# Patient Record
Sex: Female | Born: 1935
Health system: Southern US, Community
[De-identification: ages and names within clinical notes are randomized; demographics above are authoritative.]

## PROBLEM LIST (undated history)

## (undated) DIAGNOSIS — D75839 Thrombocytosis, unspecified: Secondary | ICD-10-CM

## (undated) DIAGNOSIS — K5790 Diverticulosis of intestine, part unspecified, without perforation or abscess without bleeding: Secondary | ICD-10-CM

## (undated) DIAGNOSIS — D473 Essential (hemorrhagic) thrombocythemia: Secondary | ICD-10-CM

## (undated) DIAGNOSIS — Z5309 Procedure and treatment not carried out because of other contraindication: Secondary | ICD-10-CM

## (undated) DIAGNOSIS — C4490 Unspecified malignant neoplasm of skin, unspecified: Secondary | ICD-10-CM

## (undated) DIAGNOSIS — I6529 Occlusion and stenosis of unspecified carotid artery: Secondary | ICD-10-CM

## (undated) DIAGNOSIS — T884XXA Failed or difficult intubation, initial encounter: Secondary | ICD-10-CM

## (undated) DIAGNOSIS — M5410 Radiculopathy, site unspecified: Secondary | ICD-10-CM

## (undated) DIAGNOSIS — N814 Uterovaginal prolapse, unspecified: Secondary | ICD-10-CM

## (undated) DIAGNOSIS — H269 Unspecified cataract: Secondary | ICD-10-CM

## (undated) DIAGNOSIS — K5792 Diverticulitis of intestine, part unspecified, without perforation or abscess without bleeding: Secondary | ICD-10-CM

## (undated) DIAGNOSIS — K219 Gastro-esophageal reflux disease without esophagitis: Secondary | ICD-10-CM

## (undated) DIAGNOSIS — E785 Hyperlipidemia, unspecified: Secondary | ICD-10-CM

## (undated) DIAGNOSIS — G5760 Lesion of plantar nerve, unspecified lower limb: Secondary | ICD-10-CM

## (undated) DIAGNOSIS — E039 Hypothyroidism, unspecified: Secondary | ICD-10-CM

## (undated) DIAGNOSIS — L719 Rosacea, unspecified: Secondary | ICD-10-CM

## (undated) DIAGNOSIS — N281 Cyst of kidney, acquired: Secondary | ICD-10-CM

## (undated) DIAGNOSIS — N63 Unspecified lump in unspecified breast: Secondary | ICD-10-CM

## (undated) DIAGNOSIS — N819 Female genital prolapse, unspecified: Secondary | ICD-10-CM

## (undated) DIAGNOSIS — B029 Zoster without complications: Secondary | ICD-10-CM

## (undated) HISTORY — DX: Unspecified lump in unspecified breast: N63.0

## (undated) HISTORY — PX: TONSILLECTOMY: SHX5217

## (undated) HISTORY — PX: COLON SURGERY: SHX602

## (undated) HISTORY — PX: TOTAL HIP ARTHROPLASTY: SHX124

## (undated) HISTORY — PX: ADENOIDECTOMY: SUR15

## (undated) HISTORY — DX: Occlusion and stenosis of unspecified carotid artery: I65.29

## (undated) HISTORY — DX: Failed or difficult intubation, initial encounter: T88.4XXA

## (undated) HISTORY — PX: THYROIDECTOMY: SHX17

## (undated) HISTORY — DX: Zoster without complications: B02.9

## (undated) HISTORY — DX: Unspecified malignant neoplasm of skin, unspecified: C44.90

## (undated) HISTORY — DX: Female genital prolapse, unspecified: N81.9

## (undated) HISTORY — PX: OTHER SURGICAL HISTORY: SHX169

## (undated) HISTORY — DX: Gilbert syndrome: E80.4

## (undated) HISTORY — DX: Unspecified cataract: H26.9

## (undated) HISTORY — DX: Essential (hemorrhagic) thrombocythemia: D47.3

## (undated) HISTORY — DX: Hyperlipidemia, unspecified: E78.5

## (undated) HISTORY — DX: Diverticulitis of intestine, part unspecified, without perforation or abscess without bleeding: K57.92

## (undated) HISTORY — DX: Thrombocytosis, unspecified: D75.839

## (undated) HISTORY — PX: JOINT REPLACEMENT: SHX530

## (undated) HISTORY — DX: Diverticulosis of intestine, part unspecified, without perforation or abscess without bleeding: K57.90

## (undated) HISTORY — PX: THYROID LOBECTOMY: SHX420

## (undated) HISTORY — DX: Lesion of plantar nerve, unspecified lower limb: G57.60

## (undated) HISTORY — PX: APPENDECTOMY: SHX54

---

## 2006-04-15 ENCOUNTER — Emergency Department: Payer: Self-pay | Admitting: Emergency Medicine

## 2007-10-18 ENCOUNTER — Ambulatory Visit: Payer: Self-pay | Admitting: Obstetrics and Gynecology

## 2008-07-26 ENCOUNTER — Ambulatory Visit: Payer: Self-pay | Admitting: Obstetrics and Gynecology

## 2008-10-17 ENCOUNTER — Ambulatory Visit: Payer: Self-pay | Admitting: Gastroenterology

## 2008-10-17 HISTORY — PX: UPPER GI ENDOSCOPY: SHX6162

## 2008-10-19 ENCOUNTER — Emergency Department: Payer: Self-pay | Admitting: Emergency Medicine

## 2009-10-30 ENCOUNTER — Ambulatory Visit: Payer: Self-pay | Admitting: Gastroenterology

## 2014-02-06 ENCOUNTER — Ambulatory Visit (INDEPENDENT_AMBULATORY_CARE_PROVIDER_SITE_OTHER): Payer: Medicare Other | Admitting: Podiatry

## 2014-02-06 ENCOUNTER — Ambulatory Visit (INDEPENDENT_AMBULATORY_CARE_PROVIDER_SITE_OTHER): Payer: Medicare Other

## 2014-02-06 ENCOUNTER — Encounter: Payer: Self-pay | Admitting: Podiatry

## 2014-02-06 VITALS — BP 130/69 | HR 78 | Resp 16 | Ht 64.0 in | Wt 148.0 lb

## 2014-02-06 DIAGNOSIS — M205X2 Other deformities of toe(s) (acquired), left foot: Secondary | ICD-10-CM

## 2014-02-06 DIAGNOSIS — M779 Enthesopathy, unspecified: Secondary | ICD-10-CM

## 2014-02-06 DIAGNOSIS — L608 Other nail disorders: Secondary | ICD-10-CM

## 2014-02-06 DIAGNOSIS — L603 Nail dystrophy: Secondary | ICD-10-CM

## 2014-02-06 NOTE — Progress Notes (Signed)
   Subjective:    Patient ID: Gina [name], female    DOB: 1927/02/20, 80 y.o.   MRN: [id number]  HPI Comments: 80 year old female presents the office today with complaints of a white spot on the left hallux toenail which is in present for a couple weeks. She also states that she's had some swelling around the toenail. She denies any redness or drainage. There is on discomfort upon direct pressure over the area. She also states that she recently has broken her fifth digit on her right foot and she was seen by her orthopedic physician for which an x-ray was obtained. She's had no prior treatment for the left foot. No other complaints at this time.    Toe Pain       Review of Systems  All other systems reviewed and are negative.      Objective:   Physical Exam AAO x3, NAD DP/PT pulses palpable bilaterally, CRT less than 3 seconds Protective sensation intact with Simms Weinstein monofilament, vibratory sensation intact, Achilles tendon reflex intact There is an area on central aspect of the left hallux nail white discoloration. There is slight yellow discoloration to the remaining toenails. There is slight edema along the proximal nail border. There is mild tenderness directly overlying the nail. There is no significant ingrowing along the medial or lateral nail borders. There is no surrounding erythema, increased warmth or ascending cellulitis.  There is decreased range of motion of the first MTPJ on the left greater than right. There is no pain with range of motion of this time. No overlying edema, erythema, increase in warmth. No areas of pinpoint bony tenderness or pain with vibratory sensation. There is mild erythema overlying the fifth digit which the patient states is chronic since she broke her toe. She states of this area has been improving. No other areas of pinpoint bony tenderness or pain the vibratory sensation on the right lower extremity. MMT 5/5, ROM WNL No pain with calf  compression, swelling, warmth, erythema. No open lesions or pre-ulcerative lesions.       Assessment & Plan:  80 year old female with nail discoloration, likely onychomycosis; mild edema proximal nail border left hallux; hallux limitus left first MTPJ -X-rays were obtained of the left foot (pt declined x-rays on the right).  -Treatment options both conservative and surgical were discussed including alternatives, risks, complications. -At this time due to the edema along the proximal nail border the left hallux toenail discussed possible nail avulsion. Patient wishes to hold off on any procedure at this time and understands the risks/complications. Monitor for any signs or symptoms of infection and directed to call the office in medial shin occur go to the emergency room. -Patient does not desire any treatment for possible onychomycosis to the discoloration or toenail. -Discussed treatment options for hallux limitus. Patient again wishes to all off. -Follow-up as needed. In the meantime, call the office if any questions, concerns, change in symptoms.

## 2014-06-01 ENCOUNTER — Emergency Department
Admit: 2014-06-01 | Disposition: A | Discharge status: Home / Self Care | Payer: Self-pay | Admitting: Emergency Medicine

## 2014-06-01 LAB — URINALYSIS, COMPLETE
BILIRUBIN, UR: NEGATIVE
Bacteria: NONE SEEN
Blood: NEGATIVE
GLUCOSE, UR: NEGATIVE mg/dL (ref 0–75)
KETONE: NEGATIVE
Leukocyte Esterase: NEGATIVE
Nitrite: NEGATIVE
Ph: 5 (ref 4.5–8.0)
Protein: NEGATIVE
RBC, UR: NONE SEEN /HPF (ref 0–5)
Specific Gravity: 1.011 (ref 1.003–1.030)
WBC UR: NONE SEEN /HPF (ref 0–5)

## 2014-06-01 LAB — COMPREHENSIVE METABOLIC PANEL
ANION GAP: 7 (ref 7–16)
Albumin: 4.3 g/dL
Alkaline Phosphatase: 68 U/L
BILIRUBIN TOTAL: 1 mg/dL
BUN: 28 mg/dL — AB
Calcium, Total: 9.2 mg/dL
Chloride: 105 mmol/L
Co2: 25 mmol/L
Creatinine: 1.3 mg/dL — ABNORMAL HIGH
EGFR (African American): 43 — ABNORMAL LOW
EGFR (Non-African Amer.): 37 — ABNORMAL LOW
Glucose: 153 mg/dL — ABNORMAL HIGH
POTASSIUM: 4.7 mmol/L
SGOT(AST): 24 U/L
SGPT (ALT): 16 U/L
Sodium: 137 mmol/L
TOTAL PROTEIN: 7.2 g/dL

## 2014-06-01 LAB — TROPONIN I: Troponin-I: <0.03 ng/mL

## 2014-06-01 LAB — CBC
HCT: 41.1 % (ref 35.0–47.0)
HGB: 13.4 g/dL (ref 12.0–16.0)
MCH: 28.1 pg (ref 26.0–34.0)
MCHC: 32.6 g/dL (ref 32.0–36.0)
MCV: 86 fL (ref 80–100)
PLATELETS: 439 10*3/uL (ref 150–440)
RBC: 4.76 10*6/uL (ref 3.80–5.20)
RDW: 15 % — AB (ref 11.5–14.5)
WBC: 8.3 10*3/uL (ref 3.6–11.0)

## 2014-06-19 ENCOUNTER — Encounter
Admit: 2014-06-19 | Disposition: A | Discharge status: Home / Self Care | Payer: Self-pay | Attending: Orthopedic Surgery | Admitting: Orthopedic Surgery

## 2014-09-13 ENCOUNTER — Other Ambulatory Visit: Payer: Self-pay | Admitting: Student

## 2014-09-13 DIAGNOSIS — R1310 Dysphagia, unspecified: Secondary | ICD-10-CM

## 2014-09-13 DIAGNOSIS — I10 Essential (primary) hypertension: Secondary | ICD-10-CM | POA: Insufficient documentation

## 2014-09-14 ENCOUNTER — Ambulatory Visit
Admission: RE | Admit: 2014-09-14 | Discharge: 2014-09-14 | Disposition: A | Discharge status: Home / Self Care | Payer: Medicare PPO | Source: Ambulatory Visit | Attending: Student | Admitting: Student

## 2014-09-14 DIAGNOSIS — R1310 Dysphagia, unspecified: Secondary | ICD-10-CM | POA: Diagnosis present

## 2014-10-02 ENCOUNTER — Ambulatory Visit: Payer: Medicare PPO | Admitting: Family Medicine

## 2014-10-05 ENCOUNTER — Ambulatory Visit (INDEPENDENT_AMBULATORY_CARE_PROVIDER_SITE_OTHER): Payer: Medicare PPO | Admitting: Family Medicine

## 2014-10-05 ENCOUNTER — Encounter: Payer: Self-pay | Admitting: Family Medicine

## 2014-10-05 VITALS — BP 120/68 | HR 89 | Temp 97.6°F | Resp 16 | Wt 151.7 lb

## 2014-10-05 DIAGNOSIS — M4722 Other spondylosis with radiculopathy, cervical region: Secondary | ICD-10-CM

## 2014-10-05 DIAGNOSIS — M47812 Spondylosis without myelopathy or radiculopathy, cervical region: Secondary | ICD-10-CM | POA: Diagnosis not present

## 2014-10-05 DIAGNOSIS — N814 Uterovaginal prolapse, unspecified: Secondary | ICD-10-CM

## 2014-10-05 DIAGNOSIS — N281 Cyst of kidney, acquired: Secondary | ICD-10-CM

## 2014-10-05 DIAGNOSIS — N183 Chronic kidney disease, stage 3 unspecified: Secondary | ICD-10-CM

## 2014-10-05 DIAGNOSIS — E039 Hypothyroidism, unspecified: Secondary | ICD-10-CM | POA: Diagnosis not present

## 2014-10-05 DIAGNOSIS — Z2821 Immunization not carried out because of patient refusal: Secondary | ICD-10-CM | POA: Diagnosis not present

## 2014-10-05 DIAGNOSIS — Z96641 Presence of right artificial hip joint: Secondary | ICD-10-CM

## 2014-10-05 NOTE — Progress Notes (Signed)
Name: Copeland Lapier   MRN: [id number]    DOB: February 07, 1927   Date:10/06/2014       Progress Note  Subjective  Chief Complaint  Chief Complaint  Patient presents with  . Establish Care    HPI  Mrs. Gina [name] is a pleasant 81 year old female who presents today to establish care with primary care services. She reports a past medical history of HTN, HLD, Hypothyroidism, CKD with renal cyst on the right kidney, Uterine prolapse managed with pessary, occasional cervical spine radiculopathy affecting left arm. At one point she was on a statin medication but it did not make much of a change with her cholesterol panel so she was taken off of the medication. Mrs. Killgore and her nearly 55 year old husband (3rd marriage) have been living in [place] for nearly 20 years, prior to that [facility]. They continue to live active independent lifestyles and perform all ADLs and IADLs independently. She continues to get mammograms, aged out of PAP tests but does have regular pelvic exams with her gynecologist due to uterine prolapse. Last colonoscope was in 2011 with was normal. She declines pneumococcal shots but does get annual influenza shots. Overall she voices no complaints or concerns today.  Patient Active Problem List   Diagnosis Date Noted  . CKD (chronic kidney disease), stage III 10/06/2014  . Renal cyst, acquired, right 10/06/2014  . Uterine prolapse 10/06/2014  . Cervical radiculopathy due to degenerative joint disease of spine 10/06/2014  . History of right hip replacement 10/06/2014  . Pneumococcal vaccination declined by patient 10/06/2014  . Hypothyroidism, adult 10/05/2014  . Hypertension goal BP (blood pressure) < 150/90 09/13/2014    Social History  Substance Use Topics  . Smoking status: Former Research scientist (life sciences)  . Smokeless tobacco: Not on file  . Alcohol Use: No     Current outpatient prescriptions:  .  amLODipine (NORVASC) 5 MG tablet, Take by mouth., Disp: , Rfl:  .  aspirin EC 81  MG tablet, Take by mouth., Disp: , Rfl:  .  Cholecalciferol (VITAMIN D3) 1000 UNITS CAPS, Take by mouth., Disp: , Rfl:  .  Cranberry 500 MG CAPS, Take by mouth., Disp: , Rfl:  .  irbesartan (AVAPRO) 150 MG tablet, Take by mouth., Disp: , Rfl:  .  levothyroxine (SYNTHROID, LEVOTHROID) 88 MCG tablet, Take by mouth., Disp: , Rfl:  .  vitamin E 400 UNIT capsule, Take 400 Units by mouth daily., Disp: , Rfl:   Past Surgical History  Procedure Laterality Date  . Tonsillectomy    . Appendectomy    . Cesarean section    . Joint replacement Right     total hip  . Thyroidectomy      partial    Family History  Problem Relation Age of Onset  . Family history unknown: Yes    No Known Allergies   Review of Systems  CONSTITUTIONAL: No significant weight changes, fever, chills, weakness or fatigue.  HEENT:  - Eyes: No visual changes.  - Ears: No auditory changes. No pain.  - Nose: No sneezing, congestion, runny nose. - Throat: No sore throat. No changes in swallowing. SKIN: No rash or itching.  CARDIOVASCULAR: No chest pain, chest pressure or chest discomfort. No palpitations or edema.  RESPIRATORY: No shortness of breath, cough or sputum.  GASTROINTESTINAL: No anorexia, nausea, vomiting. No changes in bowel habits. No abdominal pain or blood.  GENITOURINARY: No dysuria. No frequency. No discharge. NEUROLOGICAL: No headache, dizziness, syncope, paralysis, ataxia,  numbness or tingling in the extremities. No memory changes. No change in bowel or bladder control.  MUSCULOSKELETAL: No joint pain. No muscle pain. HEMATOLOGIC: No anemia, bleeding or bruising.  LYMPHATICS: No enlarged lymph nodes.  PSYCHIATRIC: No change in mood. No change in sleep pattern.  ENDOCRINOLOGIC: No reports of sweating, cold or heat intolerance. No polyuria or polydipsia.     Objective  BP 120/68 mmHg  Pulse 89  Temp(Src) 97.6 F (36.4 C) (Oral)  Resp 16  Wt 151 lb 11.2 oz (68.811 kg)  SpO2 94% Body mass  index is 26.03 kg/(m^2).  Physical Exam  Constitutional: Patient appears well-developed and well-nourished. In no distress.  HEENT:  - Head: Normocephalic and atraumatic.  - Ears: Bilateral TMs gray, no erythema or effusion - Nose: Nasal mucosa moist - Mouth/Throat: Oropharynx is clear and moist. No tonsillar hypertrophy or erythema. No post nasal drainage.  - Eyes: Conjunctivae clear, EOM movements normal. PERRLA. No scleral icterus.  Neck: Normal range of motion. Neck supple. No JVD present. No thyromegaly present.  Cardiovascular: Normal rate, regular rhythm and normal heart sounds.  No murmur heard.  Pulmonary/Chest: Effort normal and breath sounds normal. No respiratory distress. Musculoskeletal: Normal range of motion bilateral UE and LE, no joint effusions. Peripheral vascular: Bilateral LE no edema. Neurological: CN II-XII grossly intact with no focal deficits. Alert and oriented to person, place, and time. Coordination, balance, strength, speech and gait are normal.  Skin: Skin is warm and dry. No rash noted. No erythema.  Psychiatric: Patient has a normal mood and affect. Behavior is normal in office today. Judgment and thought content normal in office today.   Assessment & Plan  1. Hypothyroidism, adult Clinically stable findings, continue current regimen.  2. CKD (chronic kidney disease), stage III Clinically stable findings based on clinical exam and on review of any pertinent results. Recommended to patient that they continue their current regimen with regular follow ups with specialist pertaining to medical condition.   3. Renal cyst, acquired, right Recent CMP on file reviewed.  4. Uterine prolapse Clinically stable findings based on clinical exam and on review of any pertinent results. Recommended to patient that they continue their current regimen with regular follow ups with specialist pertaining to medical condition.   5. Cervical radiculopathy due to  degenerative joint disease of spine Asymptomatic currently.  6. History of right hip replacement Clinically stable findings based on clinical exam and on review of any pertinent results. Recommended to patient that they continue their current regimen with regular follow ups with specialist pertaining to medical condition.   7. Pneumococcal vaccination declined by patient Despite thorough counseling the patient has declined the proposed immunization and expresses understanding the risks and benefits explained today.

## 2014-10-06 DIAGNOSIS — N183 Chronic kidney disease, stage 3 (moderate): Secondary | ICD-10-CM

## 2014-10-06 DIAGNOSIS — M4722 Other spondylosis with radiculopathy, cervical region: Secondary | ICD-10-CM | POA: Insufficient documentation

## 2014-10-06 DIAGNOSIS — Z96641 Presence of right artificial hip joint: Secondary | ICD-10-CM | POA: Insufficient documentation

## 2014-10-06 DIAGNOSIS — Z2821 Immunization not carried out because of patient refusal: Secondary | ICD-10-CM | POA: Insufficient documentation

## 2014-10-06 DIAGNOSIS — N281 Cyst of kidney, acquired: Secondary | ICD-10-CM | POA: Insufficient documentation

## 2014-10-06 DIAGNOSIS — N814 Uterovaginal prolapse, unspecified: Secondary | ICD-10-CM | POA: Insufficient documentation

## 2014-10-06 DIAGNOSIS — N1832 Chronic kidney disease, stage 3b: Secondary | ICD-10-CM | POA: Insufficient documentation

## 2014-10-22 ENCOUNTER — Other Ambulatory Visit: Payer: Self-pay | Admitting: Family Medicine

## 2014-10-22 DIAGNOSIS — E039 Hypothyroidism, unspecified: Secondary | ICD-10-CM

## 2014-10-22 MED ORDER — LEVOTHYROXINE SODIUM 88 MCG PO TABS: 88.0000 ug | ORAL_TABLET | Freq: Every day | ORAL | Status: DC

## 2014-10-22 NOTE — Telephone Encounter (Signed)
Refill request was sent to Dr. Ashany Sundaram for approval and submission.  

## 2014-10-22 NOTE — Telephone Encounter (Signed)
Pt is requesting a refill on Levothyroxine to be sent to [facility].

## 2014-10-24 ENCOUNTER — Telehealth: Payer: Self-pay | Admitting: Family Medicine

## 2014-10-24 DIAGNOSIS — E039 Hypothyroidism, unspecified: Secondary | ICD-10-CM

## 2014-10-24 NOTE — Telephone Encounter (Signed)
Pt states that her RX was sent to [facility] for the levothyroxine and it needs to go to [address]. Pt states this is the only medication that needs to go to [facility] the others that she takes needs to go to [facility].

## 2014-10-24 NOTE — Telephone Encounter (Signed)
Refill request was sent to Dr. Bobetta Lime for approval and submission to be sent to [facility] [address].

## 2014-10-25 MED ORDER — LEVOTHYROXINE SODIUM 88 MCG PO TABS: 88.0000 ug | ORAL_TABLET | Freq: Every day | ORAL | Status: DC

## 2014-10-25 NOTE — Telephone Encounter (Signed)
Patient is requesting a return call today concerning her medications being sent to the wrong pharmacy. [phone]

## 2014-10-25 NOTE — Telephone Encounter (Signed)
Rx was verbally called in to [facility], patient was informed and asked for only this particular medication to be sent to [facility] from now on.

## 2014-11-28 ENCOUNTER — Other Ambulatory Visit: Payer: Self-pay | Admitting: Family Medicine

## 2014-11-29 ENCOUNTER — Telehealth: Payer: Self-pay | Admitting: Family Medicine

## 2014-11-29 MED ORDER — IRBESARTAN 150 MG PO TABS: 150.0000 mg | ORAL_TABLET | Freq: Every day | ORAL | Status: DC

## 2014-11-29 NOTE — Telephone Encounter (Signed)
Sent refill to [facility]

## 2014-11-29 NOTE — Telephone Encounter (Signed)
Dr Nadine Counts Patient is needing a refill on Irbesartan. Please send to [facility]. She only have enough for 2 days

## 2014-11-29 NOTE — Telephone Encounter (Signed)
Patient informed and thanks you °

## 2014-11-30 ENCOUNTER — Other Ambulatory Visit: Payer: Self-pay | Admitting: Family Medicine

## 2014-11-30 NOTE — Telephone Encounter (Signed)
Pt received her RX for her irbesartan but it was only for 30 tablets and patient usually receives 90 tablets. Pt would like the remaining 60 tablets to be sent to her pharmacy.

## 2014-12-03 ENCOUNTER — Other Ambulatory Visit: Payer: Self-pay

## 2014-12-03 MED ORDER — IRBESARTAN 150 MG PO TABS: 150.0000 mg | ORAL_TABLET | Freq: Every day | ORAL | Status: DC

## 2014-12-03 NOTE — Telephone Encounter (Signed)
Per Dr. Ancil Boozer, I contacted [place] and spoke to [facility] to correct the rx refill that was submitted online. It was changed so that this patient will have a 90 day supply with 5 refills.

## 2014-12-03 NOTE — Telephone Encounter (Signed)
Refill request was sent to Dr. Krichna Sowles for approval and submission.  

## 2014-12-19 ENCOUNTER — Ambulatory Visit (INDEPENDENT_AMBULATORY_CARE_PROVIDER_SITE_OTHER): Payer: Medicare PPO | Admitting: Family Medicine

## 2014-12-19 ENCOUNTER — Encounter: Payer: Self-pay | Admitting: Family Medicine

## 2014-12-19 VITALS — BP 126/80 | HR 72 | Temp 98.0°F | Resp 14 | Wt 154.2 lb

## 2014-12-19 DIAGNOSIS — J029 Acute pharyngitis, unspecified: Secondary | ICD-10-CM | POA: Insufficient documentation

## 2014-12-19 DIAGNOSIS — J0100 Acute maxillary sinusitis, unspecified: Secondary | ICD-10-CM | POA: Diagnosis not present

## 2014-12-19 MED ORDER — AZITHROMYCIN 250 MG PO TABS: ORAL_TABLET | ORAL | Status: DC

## 2014-12-19 MED ORDER — HYDROCOD POLST-CPM POLST ER 10-8 MG/5ML PO SUER: 5.0000 mL | Freq: Every evening | ORAL | Status: DC | PRN

## 2014-12-19 NOTE — Progress Notes (Deleted)
Name: Gina [name]   MRN: [id number]    DOB: May 14, 1926   Date:12/19/2014       Progress Note  Subjective  Chief Complaint  Chief Complaint  Patient presents with  . Sore Throat    tickling post nasal  . Cough    dry    HPI  Patient is here today with concerns regarding the following symptoms sore throat, congestion, post nasal drip, sneezing, ear pressure, non productive cough and productive cough that started about a week ago.  Associated with fatigue and malaise. Has tried the following home remedies: hot soup (chicken), OTC Tylenol, warm salt gargle and throat lozengers.  No problem-specific assessment & plan notes found for this encounter.   Past Medical History  Diagnosis Date  . Thyroid disease   . Hypertension   . Hyperlipidemia     history of   . Cataract     Social History  Substance Use Topics  . Smoking status: Former Research scientist (life sciences)  . Smokeless tobacco: Not on file  . Alcohol Use: No     Current outpatient prescriptions:  .  amLODipine (NORVASC) 5 MG tablet, TAKE ONE TABLET BY MOUTH EVERY DAY, Disp: 90 tablet, Rfl: 3 .  aspirin EC 81 MG tablet, Take by mouth., Disp: , Rfl:  .  Cholecalciferol (VITAMIN D3) 1000 UNITS CAPS, Take by mouth., Disp: , Rfl:  .  Cranberry 500 MG CAPS, Take by mouth., Disp: , Rfl:  .  irbesartan (AVAPRO) 150 MG tablet, Take 1 tablet (150 mg total) by mouth daily., Disp: 60 tablet, Rfl: 5 .  levothyroxine (SYNTHROID, LEVOTHROID) 88 MCG tablet, Take 1 tablet (88 mcg total) by mouth daily before breakfast., Disp: 90 tablet, Rfl: 2 .  vitamin E 400 UNIT capsule, Take 400 Units by mouth daily., Disp: , Rfl:   No Known Allergies  ROS  Positive for fatigue, nasal congestion, sinus pressure, ear fullness, cough as mentioned in HPI, otherwise all systems reviewed and are negative.  Objective  Filed Vitals:   12/19/14 1107  BP: 126/80  Pulse: 72  Temp: 98 F (36.7 C)  TempSrc: Oral  Resp: 14  Weight: 154 lb 3.2 oz (69.945 kg)  SpO2:  95%   Body mass index is 26.46 kg/(m^2).   Physical Exam  Constitutional: Patient appears well-developed and well-nourished. In no acute distress but does appear to be fatigued from acute illness. HEENT:  - Head: Normocephalic and atraumatic.  - Ears: RIGHT TM bulging with minimal clear exudate, LEFT TM bulging with minimal clear exudate.  - Nose: Nasal mucosa boggy and congested.  - Mouth/Throat: Oropharynx is moist with slight erythema of bilateral tonsils without hypertrophy or exudates. Post nasal drainage present.  - Eyes: Conjunctivae clear, EOM movements normal. PERRLA. No scleral icterus.  Neck: Normal range of motion. Neck supple. No JVD present. No thyromegaly present. No local lymphadenopathy. Cardiovascular: Regular rate, regular rhythm with no murmurs heard.  Pulmonary/Chest: Effort normal and breath sounds clear in all lung fields.  Musculoskeletal: Normal range of motion bilateral UE and LE, no joint effusions. Skin: Skin is warm and dry. No rash noted. Psychiatric: Patient has a normal mood and affect. Behavior is normal in office today. Judgment and thought content normal in office today.   Assessment & Plan  Etiologies include allergic rhinitis, viral or bacterial infection. Instructed patient on increasing hydration, nasal saline spray, steam inhalation, NSAID if tolerated and not contraindicated. If not already doing so start taking daily anti-histamine and use a steroid nasal  spray. If symptoms persist/worsen may consider antibiotic therapy.

## 2014-12-19 NOTE — Progress Notes (Signed)
Name: Gina [name]   MRN: [id number]    DOB: Sep 02, 1926   Date:12/19/2014       Progress Note  Subjective  Chief Complaint  Chief Complaint  Patient presents with  . Sore Throat    tickling post nasal  . Cough    dry    HPI  Patient is here today with concerns regarding the following symptoms sore throat, congestion, post nasal drip, sneezing, ear pressure, non productive cough that started this past Saturday (5 days ago).  Associated with fatigue and malaise. Throat clearing and irritation has been keeping her up at night. Has tried the following home remedies: hot soup (chicken), OTC Tylenol, warm salt gargle and throat lozengers. Sick contacts include her husband who is also ill and saw a physician in my office the other day. Not associated with rash, headaches, altered mentation, night sweats.   Past Medical History  Diagnosis Date  . Thyroid disease   . Hypertension   . Hyperlipidemia     history of   . Cataract     Social History  Substance Use Topics  . Smoking status: Former Research scientist (life sciences)  . Smokeless tobacco: Not on file  . Alcohol Use: No     Current outpatient prescriptions:  .  amLODipine (NORVASC) 5 MG tablet, TAKE ONE TABLET BY MOUTH EVERY DAY, Disp: 90 tablet, Rfl: 3 .  aspirin EC 81 MG tablet, Take by mouth., Disp: , Rfl:  .  Cholecalciferol (VITAMIN D3) 1000 UNITS CAPS, Take by mouth., Disp: , Rfl:  .  Cranberry 500 MG CAPS, Take by mouth., Disp: , Rfl:  .  irbesartan (AVAPRO) 150 MG tablet, Take 1 tablet (150 mg total) by mouth daily., Disp: 60 tablet, Rfl: 5 .  levothyroxine (SYNTHROID, LEVOTHROID) 88 MCG tablet, Take 1 tablet (88 mcg total) by mouth daily before breakfast., Disp: 90 tablet, Rfl: 2 .  vitamin E 400 UNIT capsule, Take 400 Units by mouth daily., Disp: , Rfl:   No Known Allergies  ROS  Positive for fatigue, sore throat, nasal congestion, sinus pressure, ear fullness, cough as mentioned in HPI, otherwise all systems reviewed and are  negative.  Objective  Filed Vitals:   12/19/14 1107  BP: 126/80  Pulse: 72  Temp: 98 F (36.7 C)  TempSrc: Oral  Resp: 14  Weight: 154 lb 3.2 oz (69.945 kg)  SpO2: 95%   Body mass index is 26.46 kg/(m^2).   Physical Exam  Constitutional: Patient appears well-developed and well-nourished. In no acute distress but does appear to be fatigued from acute illness. HEENT:  - Head: Normocephalic and atraumatic.  - Ears: RIGHT TM bulging with minimal clear exudate, LEFT TM bulging with minimal clear exudate.  - Nose: Nasal mucosa boggy and congested.  - Mouth/Throat: Oropharynx is moist with slight erythema of bilateral tonsils without hypertrophy or exudates. Post nasal drainage present.  - Eyes: Conjunctivae clear, EOM movements normal. PERRLA. No scleral icterus.  Neck: Normal range of motion. Neck supple. No JVD present. No thyromegaly present. No local lymphadenopathy. Cardiovascular: Regular rate, regular rhythm with no murmurs heard.  Pulmonary/Chest: Effort normal and breath sounds clear in all lung fields.  Musculoskeletal: Normal range of motion bilateral UE and LE, no joint effusions. Skin: Skin is warm and dry. No rash noted. Psychiatric: Patient has a normal mood and affect. Behavior is normal in office today. Judgment and thought content normal in office today.   Assessment & Plan  1. Subacute maxillary sinusitis Etiologies include allergic rhinitis, viral  or bacterial infection. Instructed patient on increasing hydration, nasal saline spray, steam inhalation, NSAID if tolerated and not contraindicated.  - azithromycin (ZITHROMAX) 250 MG tablet; 2 tabs po day 1 then 1 tab po qday for 4 more days (generic Zpak)  Dispense: 6 tablet; Refill: 0 - chlorpheniramine-HYDROcodone (TUSSIONEX PENNKINETIC ER) 10-8 MG/5ML SUER; Take 5 mLs by mouth at bedtime as needed for cough.  Dispense: 50 mL; Refill: 0  2. Pharyngitis If tussionex too expensive may use Robittusin PM OTC,  throat lozenges, salt water gargles.

## 2014-12-19 NOTE — Patient Instructions (Signed)

## 2014-12-25 ENCOUNTER — Emergency Department
Admission: EM | Admit: 2014-12-25 | Discharge: 2014-12-25 | Disposition: A | Discharge status: Home / Self Care | Payer: Medicare PPO | Attending: Emergency Medicine | Admitting: Emergency Medicine

## 2014-12-25 ENCOUNTER — Encounter: Payer: Self-pay | Admitting: Emergency Medicine

## 2014-12-25 DIAGNOSIS — Z792 Long term (current) use of antibiotics: Secondary | ICD-10-CM | POA: Diagnosis not present

## 2014-12-25 DIAGNOSIS — Z7982 Long term (current) use of aspirin: Secondary | ICD-10-CM | POA: Insufficient documentation

## 2014-12-25 DIAGNOSIS — Z87891 Personal history of nicotine dependence: Secondary | ICD-10-CM | POA: Insufficient documentation

## 2014-12-25 DIAGNOSIS — G5790 Unspecified mononeuropathy of unspecified lower limb: Secondary | ICD-10-CM

## 2014-12-25 DIAGNOSIS — G5793 Unspecified mononeuropathy of bilateral lower limbs: Secondary | ICD-10-CM | POA: Diagnosis not present

## 2014-12-25 DIAGNOSIS — I129 Hypertensive chronic kidney disease with stage 1 through stage 4 chronic kidney disease, or unspecified chronic kidney disease: Secondary | ICD-10-CM | POA: Insufficient documentation

## 2014-12-25 DIAGNOSIS — M545 Low back pain: Secondary | ICD-10-CM | POA: Diagnosis present

## 2014-12-25 DIAGNOSIS — Z79899 Other long term (current) drug therapy: Secondary | ICD-10-CM | POA: Diagnosis not present

## 2014-12-25 DIAGNOSIS — N183 Chronic kidney disease, stage 3 (moderate): Secondary | ICD-10-CM | POA: Insufficient documentation

## 2014-12-25 NOTE — Discharge Instructions (Signed)
Neuropathic Pain Neuropathic pain is pain caused by damage to the nerves that are responsible for certain sensations in your body (sensory nerves). The pain can be caused by damage to:   The sensory nerves that send signals to your spinal cord and brain (peripheral nervous system).  The sensory nerves in your brain or spinal cord (central nervous system). Neuropathic pain can make you more sensitive to pain. What would be a minor sensation for most people may feel very painful if you have neuropathic pain. This is usually a long-term condition that can be difficult to treat. The type of pain can differ from person to person. It may start suddenly (acute), or it may develop slowly and last for a long time (chronic). Neuropathic pain may come and go as damaged nerves heal or may stay at the same level for years. It often causes emotional distress, loss of sleep, and a lower quality of life. CAUSES  The most common cause of damage to a sensory nerve is diabetes. Many other diseases and conditions can also cause neuropathic pain. Causes of neuropathic pain can be classified as:  Toxic. Many drugs and chemicals can cause toxic damage. The most common cause of toxic neuropathic pain is damage from drug treatment for cancer (chemotherapy).  Metabolic. This type of pain can happen when a disease causes imbalances that damage nerves. Diabetes is the most common of these diseases. Vitamin B deficiency caused by long-term alcohol abuse is another common cause.  Traumatic. Any injury that cuts, crushes, or stretches a nerve can cause damage and pain. A common example is feeling pain after losing an arm or leg (phantom limb pain).  Compression-related. If a sensory nerve gets trapped or compressed for a long period of time, the blood supply to the nerve can be cut off.  Vascular. Many blood vessel diseases can cause neuropathic pain by decreasing blood supply and oxygen to nerves.  Autoimmune. This type of  pain results from diseases in which the body's defense system mistakenly attacks sensory nerves. Examples of autoimmune diseases that can cause neuropathic pain include lupus and multiple sclerosis.  Infectious. Many types of viral infections can damage sensory nerves and cause pain. Shingles infection is a common cause of this type of pain.  Inherited. Neuropathic pain can be a symptom of many diseases that are passed down through families (genetic). SIGNS AND SYMPTOMS  The main symptom is pain. Neuropathic pain is often described as:  Burning.  Shock-like.  Stinging.  Hot or cold.  Itching. DIAGNOSIS  No single test can diagnose neuropathic pain. Your health care provider will do a physical exam and ask you about your pain. You may use a pain scale to describe how bad your pain is. You may also have tests to see if you have a high sensitivity to pain and to help find the cause and location of any sensory nerve damage. These tests may include:  Imaging studies, such as:  X-rays.  CT scan.  MRI.  Nerve conduction studies to test how well nerve signals travel through your sensory nerves (electrodiagnostic testing).  Stimulating your sensory nerves through electrodes on your skin and measuring the response in your spinal cord and brain (somatosensory evoked potentials). TREATMENT  Treatment for neuropathic pain may change over time. You may need to try different treatment options or a combination of treatments. Some options include:  Over-the-counter pain relievers.  Prescription medicines. Some medicines used to treat other conditions may also help neuropathic pain. These  include medicines to:  Control seizures (anticonvulsants).  Relieve depression (antidepressants).  Prescription-strength pain relievers (narcotics). These are usually used when other pain relievers do not help.  Transcutaneous nerve stimulation (TENS). This uses electrical currents to block painful nerve  signals. The treatment is painless.  Topical and local anesthetics. These are medicines that numb the nerves. They can be injected as a nerve block or applied to the skin.  Alternative treatments, such as:  Acupuncture.  Meditation.  Massage.  Physical therapy.  Pain management programs.  Counseling. HOME CARE INSTRUCTIONS  Learn as much as you can about your condition.  Take medicines only as directed by your health care provider.  Work closely with all your health care providers to find what works best for you.  Have a good support system at home.  Consider joining a chronic pain support group. SEEK MEDICAL CARE IF:  Your pain treatments are not helping.  You are having side effects from your medicines.  You are struggling with fatigue, mood changes, depression, or anxiety.   This information is not intended to replace advice given to you by your health care provider. Make sure you discuss any questions you have with your health care provider.   Document Released: 11/07/2003 Document Revised: 03/02/2014 Document Reviewed: 07/20/2013 Elsevier Interactive Patient Education 2016 [employer]  Peripheral Neuropathy Peripheral neuropathy is a type of nerve damage. It affects nerves that carry signals between the spinal cord and other parts of the body. These are called peripheral nerves. With peripheral neuropathy, one nerve or a group of nerves may be damaged.  CAUSES  Many things can damage peripheral nerves. For some people with peripheral neuropathy, the cause is unknown. Some causes include:  Diabetes. This is the most common cause of peripheral neuropathy.  Injury to a nerve.  Pressure or stress on a nerve that lasts a long time.  Too little vitamin B. Alcoholism can lead to this.  Infections.  Autoimmune diseases, such as multiple sclerosis and systemic lupus erythematosus.  Inherited nerve diseases.  Some medicines, such as cancer drugs.  Toxic  substances, such as lead and mercury.  Too little blood flowing to the legs.  Kidney disease.  Thyroid disease. SIGNS AND SYMPTOMS  Different people have different symptoms. The symptoms you have will depend on which of your nerves is damaged. Common symptoms include:  Loss of feeling (numbness) in the feet and hands.  Tingling in the feet and hands.  Pain that burns.  Very sensitive skin.  Weakness.  Not being able to move a part of the body (paralysis).  Muscle twitching.  Clumsiness or poor coordination.  Loss of balance.  Not being able to control your bladder.  Feeling dizzy.  Sexual problems. DIAGNOSIS  Peripheral neuropathy is a symptom, not a disease. Finding the cause of peripheral neuropathy can be hard. To figure that out, your health care provider will take a medical history and do a physical exam. A neurological exam will also be done. This involves checking things affected by your brain, spinal cord, and nerves (nervous system). For example, your health care provider will check your reflexes, how you move, and what you can feel.  Other types of tests may also be ordered, such as:  Blood tests.  A test of the fluid in your spinal cord.  Imaging tests, such as CT scans or an MRI.  Electromyography (EMG). This test checks the nerves that control muscles.  Nerve conduction velocity tests. These tests check how fast  messages pass through your nerves.  Nerve biopsy. A small piece of nerve is removed. It is then checked under a microscope. TREATMENT   Medicine is often used to treat peripheral neuropathy. Medicines may include:  Pain-relieving medicines. Prescription or over-the-counter medicine may be suggested.  Antiseizure medicine. This may be used for pain.  Antidepressants. These also may help ease pain from neuropathy.  Lidocaine. This is a numbing medicine. You might wear a patch or be given a shot.  Mexiletine. This medicine is typically  used to help control irregular heart rhythms.  Surgery. Surgery may be needed to relieve pressure on a nerve or to destroy a nerve that is causing pain.  Physical therapy to help movement.  Assistive devices to help movement. HOME CARE INSTRUCTIONS   Only take over-the-counter or prescription medicines as directed by your health care provider. Follow the instructions carefully for any given medicines. Do not take any other medicines without first getting approval from your health care provider.  If you have diabetes, work closely with your health care provider to keep your blood sugar under control.  If you have numbness in your feet:  Check every day for signs of injury or infection. Watch for redness, warmth, and swelling.  Wear padded socks and comfortable shoes. These help protect your feet.  Do not do things that put pressure on your damaged nerve.  Do not smoke. Smoking keeps blood from getting to damaged nerves.  Avoid or limit alcohol. Too much alcohol can cause a lack of B vitamins. These vitamins are needed for healthy nerves.  Develop a good support system. Coping with peripheral neuropathy can be stressful. Talk to a mental health specialist or join a support group if you are struggling.  Follow up with your health care provider as directed. SEEK MEDICAL CARE IF:   You have new signs or symptoms of peripheral neuropathy.  You are struggling emotionally from dealing with peripheral neuropathy.  You have a fever. SEEK IMMEDIATE MEDICAL CARE IF:   You have an injury or infection that is not healing.  You feel very dizzy or begin vomiting.  You have chest pain.  You have trouble breathing.   This information is not intended to replace advice given to you by your health care provider. Make sure you discuss any questions you have with your health care provider.   Document Released: 01/30/2002 Document Revised: 10/22/2010 Document Reviewed: 10/17/2012 Elsevier  Interactive Patient Education [employer].

## 2014-12-25 NOTE — ED Notes (Signed)
States has been doing hard work and today developd low back pain with numbness down her legs

## 2014-12-25 NOTE — ED Provider Notes (Signed)
[facility] Emergency Department Provider Note  ____________________________________________  Time seen: Approximately 9:02 AM  I have reviewed the triage vital signs and the nursing notes.   HISTORY  Chief Complaint Back Pain   HPI Abbi Mancini is a 81 y.o. female resents for evaluation of low back pain with some tingling and numbness down her legs to her feet suggests today. Denies any injury. Has a history of neuropathy in the past of the upper extremities and is now down into the lower extremities. Denies any pain when walking, denies any shortness of breath. Denies any saddle paresthesia.   Past Medical History  Diagnosis Date  . Thyroid disease   . Hypertension   . Hyperlipidemia     history of   . Cataract     Patient Active Problem List   Diagnosis Date Noted  . Subacute maxillary sinusitis 12/19/2014  . Pharyngitis 12/19/2014  . CKD (chronic kidney disease), stage III 10/06/2014  . Renal cyst, acquired, right 10/06/2014  . Uterine prolapse 10/06/2014  . Cervical radiculopathy due to degenerative joint disease of spine 10/06/2014  . History of right hip replacement 10/06/2014  . Pneumococcal vaccination declined by patient 10/06/2014  . Hypothyroidism, adult 10/05/2014  . Hypertension goal BP (blood pressure) < 150/90 09/13/2014    Past Surgical History  Procedure Laterality Date  . Tonsillectomy    . Appendectomy    . Cesarean section    . Joint replacement Right     total hip  . Thyroidectomy      partial    Current Outpatient Rx  Name  Route  Sig  Dispense  Refill  . amLODipine (NORVASC) 5 MG tablet      TAKE ONE TABLET BY MOUTH EVERY DAY   90 tablet   3   . aspirin EC 81 MG tablet   Oral   Take by mouth.         Marland Kitchen azithromycin (ZITHROMAX) 250 MG tablet      2 tabs po day 1 then 1 tab po qday for 4 more days (generic Zpak)   6 tablet   0   . chlorpheniramine-HYDROcodone (TUSSIONEX PENNKINETIC ER) 10-8 MG/5ML  SUER   Oral   Take 5 mLs by mouth at bedtime as needed for cough.   50 mL   0   . Cholecalciferol (VITAMIN D3) 1000 UNITS CAPS   Oral   Take by mouth.         . Cranberry 500 MG CAPS   Oral   Take by mouth.         . irbesartan (AVAPRO) 150 MG tablet   Oral   Take 1 tablet (150 mg total) by mouth daily.   60 tablet   5   . levothyroxine (SYNTHROID, LEVOTHROID) 88 MCG tablet   Oral   Take 1 tablet (88 mcg total) by mouth daily before breakfast.   90 tablet   2   . vitamin E 400 UNIT capsule   Oral   Take 400 Units by mouth daily.           Allergies Review of patient's allergies indicates no known allergies.  Family History  Problem Relation Age of Onset  . Family history unknown: Yes    Social History Social History  Substance Use Topics  . Smoking status: Former Research scientist (life sciences)  . Smokeless tobacco: None  . Alcohol Use: No    Review of Systems Constitutional: No fever/chills Eyes: No visual changes. ENT: No  sore throat. Cardiovascular: Denies chest pain. Respiratory: Denies shortness of breath. Gastrointestinal: No abdominal pain.  No nausea, no vomiting.  No diarrhea.  No constipation. Genitourinary: Negative for dysuria. Musculoskeletal: Negative for back pain. Skin: Negative for rash. Neurological: Negative for headaches, focal weakness, positive for tingling in both feet.  10-point ROS otherwise negative.  ____________________________________________   PHYSICAL EXAM:  VITAL SIGNS: ED Triage Vitals  Enc Vitals Group     BP 12/25/14 0837 155/72 mmHg     Pulse Rate 12/25/14 0837 89     Resp 12/25/14 0837 18     Temp 12/25/14 0837 97.6 F (36.4 C)     Temp Source 12/25/14 0837 Oral     SpO2 12/25/14 0837 96 %     Weight 12/25/14 0837 150 lb (68.04 kg)     Height 12/25/14 0837 5\' 4"  (1.626 m)     Head Cir --      Peak Flow --      Pain Score 12/25/14 0838 0     Pain Loc --      Pain Edu? --      Excl. in [place]? --     Constitutional:  Alert and oriented. Well appearing and in no acute distress. Neck: No stridor.   Cardiovascular: Normal rate, regular rhythm. Grossly normal heart sounds.  Good peripheral circulation. Respiratory: Normal respiratory effort.  No retractions. Lungs CTAB. Gastrointestinal: Soft and nontender. No distention. No abdominal bruits. No CVA tenderness. Musculoskeletal: No lower extremity tenderness nor edema.  No joint effusions. Neurologic:  Normal speech and language. No gross focal neurologic deficits are appreciated. No gait instability. Distally neurovascularly intact. Skin:  Skin is warm, dry and intact. No rash noted. Psychiatric: Mood and affect are normal. Speech and behavior are normal.  ____________________________________________   LABS (all labs ordered are listed, but only abnormal results are displayed)  Labs Reviewed - No data to display ____________________________________________    PROCEDURES  Procedure(s) performed: None  Critical Care performed: No  ____________________________________________   INITIAL IMPRESSION / ASSESSMENT AND PLAN / ED COURSE  Pertinent labs & imaging results that were available during my care of the patient were reviewed by me and considered in my medical decision making (see chart for details).  Bilateral lower extremity neuropathy unspecified. Patient to follow-up with her orthopedic doctor for evaluation and MRI. Patient voices no other emergency medical complaints at this time. ____________________________________________   FINAL CLINICAL IMPRESSION(S) / ED DIAGNOSES  Final diagnoses:  Neuropathy of lower extremity, unspecified laterality      Arlyss Repress, PA-C 12/25/14 1009  Eula Listen, MD 12/25/14 1455

## 2014-12-25 NOTE — ED Notes (Signed)
C/o lower back pain with pain radiating down both legs, also having some numbness in both feet since yesterday, denies any injury

## 2014-12-29 ENCOUNTER — Emergency Department
Admission: EM | Admit: 2014-12-29 | Discharge: 2014-12-29 | Disposition: A | Discharge status: Home / Self Care | Payer: Medicare PPO | Attending: Emergency Medicine | Admitting: Emergency Medicine

## 2014-12-29 ENCOUNTER — Emergency Department: Payer: Medicare PPO

## 2014-12-29 ENCOUNTER — Encounter: Payer: Self-pay | Admitting: Emergency Medicine

## 2014-12-29 DIAGNOSIS — Z7982 Long term (current) use of aspirin: Secondary | ICD-10-CM | POA: Insufficient documentation

## 2014-12-29 DIAGNOSIS — R1013 Epigastric pain: Secondary | ICD-10-CM | POA: Diagnosis not present

## 2014-12-29 DIAGNOSIS — I129 Hypertensive chronic kidney disease with stage 1 through stage 4 chronic kidney disease, or unspecified chronic kidney disease: Secondary | ICD-10-CM | POA: Diagnosis not present

## 2014-12-29 DIAGNOSIS — N183 Chronic kidney disease, stage 3 (moderate): Secondary | ICD-10-CM | POA: Diagnosis not present

## 2014-12-29 DIAGNOSIS — Z79899 Other long term (current) drug therapy: Secondary | ICD-10-CM | POA: Diagnosis not present

## 2014-12-29 DIAGNOSIS — R110 Nausea: Secondary | ICD-10-CM | POA: Insufficient documentation

## 2014-12-29 DIAGNOSIS — Z87891 Personal history of nicotine dependence: Secondary | ICD-10-CM | POA: Insufficient documentation

## 2014-12-29 DIAGNOSIS — R1031 Right lower quadrant pain: Secondary | ICD-10-CM | POA: Insufficient documentation

## 2014-12-29 LAB — URINALYSIS COMPLETE WITH MICROSCOPIC (ARMC ONLY)
BACTERIA UA: NONE SEEN
Bilirubin Urine: NEGATIVE
GLUCOSE, UA: NEGATIVE mg/dL
Hgb urine dipstick: NEGATIVE
KETONES UR: NEGATIVE mg/dL
NITRITE: NEGATIVE
Protein, ur: NEGATIVE mg/dL
SPECIFIC GRAVITY, URINE: 1.009 (ref 1.005–1.030)
pH: 5 (ref 5.0–8.0)

## 2014-12-29 LAB — CBC
HCT: 41.9 % (ref 35.0–47.0)
HEMOGLOBIN: 13.9 g/dL (ref 12.0–16.0)
MCH: 28.2 pg (ref 26.0–34.0)
MCHC: 33.2 g/dL (ref 32.0–36.0)
MCV: 84.8 fL (ref 80.0–100.0)
Platelets: 587 10*3/uL — ABNORMAL HIGH (ref 150–440)
RBC: 4.94 MIL/uL (ref 3.80–5.20)
RDW: 14.9 % — ABNORMAL HIGH (ref 11.5–14.5)
WBC: 10.2 10*3/uL (ref 3.6–11.0)

## 2014-12-29 LAB — COMPREHENSIVE METABOLIC PANEL
ALT: 15 U/L (ref 14–54)
ANION GAP: 6 (ref 5–15)
AST: 22 U/L (ref 15–41)
Albumin: 4.7 g/dL (ref 3.5–5.0)
Alkaline Phosphatase: 64 U/L (ref 38–126)
BILIRUBIN TOTAL: 1.3 mg/dL — AB (ref 0.3–1.2)
BUN: 25 mg/dL — ABNORMAL HIGH (ref 6–20)
CO2: 23 mmol/L (ref 22–32)
Calcium: 9.6 mg/dL (ref 8.9–10.3)
Chloride: 101 mmol/L (ref 101–111)
Creatinine, Ser: 1.26 mg/dL — ABNORMAL HIGH (ref 0.44–1.00)
GFR calc Af Amer: 43 mL/min — ABNORMAL LOW (ref >60)
GFR calc non Af Amer: 37 mL/min — ABNORMAL LOW (ref >60)
Glucose, Bld: 131 mg/dL — ABNORMAL HIGH (ref 65–99)
Potassium: 4.4 mmol/L (ref 3.5–5.1)
Sodium: 130 mmol/L — ABNORMAL LOW (ref 135–145)
TOTAL PROTEIN: 7.8 g/dL (ref 6.5–8.1)

## 2014-12-29 LAB — LIPASE, BLOOD: Lipase: 69 U/L — ABNORMAL HIGH (ref 11–51)

## 2014-12-29 MED ORDER — SODIUM CHLORIDE 0.9 % IV SOLN
1000.0000 mL | Freq: Once | INTRAVENOUS | Status: AC
  Administered 2014-12-29: 1000 mL via INTRAVENOUS

## 2014-12-29 MED ORDER — IOHEXOL 300 MG/ML  SOLN
80.0000 mL | Freq: Once | INTRAMUSCULAR | Status: AC | PRN
  Administered 2014-12-29: 80 mL via INTRAVENOUS

## 2014-12-29 MED ORDER — MORPHINE SULFATE (PF) 2 MG/ML IV SOLN
2.0000 mg | Freq: Once | INTRAVENOUS | Status: DC
  Filled 2014-12-29: qty 1

## 2014-12-29 MED ORDER — ONDANSETRON HCL 4 MG/2ML IJ SOLN
4.0000 mg | Freq: Once | INTRAMUSCULAR | Status: AC
  Administered 2014-12-29: 4 mg via INTRAVENOUS
  Filled 2014-12-29: qty 2

## 2014-12-29 MED ORDER — ONDANSETRON HCL 4 MG PO TABS: 4.0000 mg | ORAL_TABLET | Freq: Every day | ORAL | Status: DC | PRN

## 2014-12-29 MED ORDER — IOHEXOL 240 MG/ML SOLN
25.0000 mL | Freq: Once | INTRAMUSCULAR | Status: AC | PRN
  Administered 2014-12-29: 25 mL via ORAL

## 2014-12-29 NOTE — ED Provider Notes (Signed)
[facility] Emergency Department Provider Note  ____________________________________________  Time seen: 5 PM  I have reviewed the triage vital signs and the nursing notes.   HISTORY  Chief Complaint Abdominal Pain    HPI Gina [name] is a 81 y.o. female who presents with complaints of cramping and moderate right lower abdominal pain which started today. She reports she has a history of diverticulosis diagnosed on colonoscopy that was on her ascending colon but she is never had diverticulitis before. She denies fevers chills. She does have nausea. She does also has a history of kidney stones but reports this doesn't feel similar. She also complains of some epigastric discomfort that primarily her concern is her right lower quadrant discomfort. No dysuria. No flank pain.     Past Medical History  Diagnosis Date  . Thyroid disease   . Hypertension   . Hyperlipidemia     history of   . Cataract     Patient Active Problem List   Diagnosis Date Noted  . Subacute maxillary sinusitis 12/19/2014  . Pharyngitis 12/19/2014  . CKD (chronic kidney disease), stage III 10/06/2014  . Renal cyst, acquired, right 10/06/2014  . Uterine prolapse 10/06/2014  . Cervical radiculopathy due to degenerative joint disease of spine 10/06/2014  . History of right hip replacement 10/06/2014  . Pneumococcal vaccination declined by patient 10/06/2014  . Hypothyroidism, adult 10/05/2014  . Hypertension goal BP (blood pressure) < 150/90 09/13/2014    Past Surgical History  Procedure Laterality Date  . Tonsillectomy    . Appendectomy    . Cesarean section    . Joint replacement Right     total hip  . Thyroidectomy      partial    Current Outpatient Rx  Name  Route  Sig  Dispense  Refill  . amLODipine (NORVASC) 5 MG tablet      TAKE ONE TABLET BY MOUTH EVERY DAY   90 tablet   3   . aspirin EC 81 MG tablet   Oral   Take by mouth.         Marland Kitchen azithromycin  (ZITHROMAX) 250 MG tablet      2 tabs po day 1 then 1 tab po qday for 4 more days (generic Zpak)   6 tablet   0   . chlorpheniramine-HYDROcodone (TUSSIONEX PENNKINETIC ER) 10-8 MG/5ML SUER   Oral   Take 5 mLs by mouth at bedtime as needed for cough.   50 mL   0   . Cholecalciferol (VITAMIN D3) 1000 UNITS CAPS   Oral   Take by mouth.         . Cranberry 500 MG CAPS   Oral   Take by mouth.         . irbesartan (AVAPRO) 150 MG tablet   Oral   Take 1 tablet (150 mg total) by mouth daily.   60 tablet   5   . levothyroxine (SYNTHROID, LEVOTHROID) 88 MCG tablet   Oral   Take 1 tablet (88 mcg total) by mouth daily before breakfast.   90 tablet   2   . vitamin E 400 UNIT capsule   Oral   Take 400 Units by mouth daily.           Allergies Review of patient's allergies indicates no known allergies.  Family History  Problem Relation Age of Onset  . Family history unknown: Yes    Social History Social History  Substance Use Topics  .  Smoking status: Former Research scientist (life sciences)  . Smokeless tobacco: None  . Alcohol Use: No    Review of Systems  Constitutional: Negative for fever. Eyes: Negative for visual changes. ENT: Negative for sore throat Cardiovascular: Negative for chest pain. Respiratory: Negative for shortness of breath. Gastrointestinal: Positive for abdominal pain and nausea Genitourinary: Negative for dysuria. Musculoskeletal: Negative for back pain. Skin: Negative for rash. Neurological: Negative for headaches or focal weakness Psychiatric no anxiety    ____________________________________________   PHYSICAL EXAM:  VITAL SIGNS: ED Triage Vitals  Enc Vitals Group     BP 12/29/14 1519 161/62 mmHg     Pulse Rate 12/29/14 1519 80     Resp 12/29/14 1519 18     Temp 12/29/14 1519 97.4 F (36.3 C)     Temp Source 12/29/14 1519 Oral     SpO2 12/29/14 1519 98 %     Weight 12/29/14 1519 150 lb (68.04 kg)     Height 12/29/14 1519 5\' 4"  (1.626 m)      Head Cir --      Peak Flow --      Pain Score 12/29/14 1522 5     Pain Loc --      Pain Edu? --      Excl. in [place]? --      Constitutional: Alert and oriented. Well appearing and in no distress. Eyes: Conjunctivae are normal.  ENT   Head: Normocephalic and atraumatic.   Mouth/Throat: Mucous membranes are moist. Cardiovascular: Normal rate, regular rhythm. Normal and symmetric distal pulses are present in all extremities. No murmurs, rubs, or gallops. Respiratory: Normal respiratory effort without tachypnea nor retractions. Breath sounds are clear and equal bilaterally.  Gastrointestinal: Mild discomfort in the right lower quadrant. No epigastric tenderness to palpation, no right upper quadrant tenderness to palpation.. No distention. There is no CVA tenderness. Genitourinary: deferred Musculoskeletal: Nontender with normal range of motion in all extremities. No lower extremity tenderness nor edema. Neurologic:  Normal speech and language. No gross focal neurologic deficits are appreciated. Skin:  Skin is warm, dry and intact. No rash noted. Psychiatric: Mood and affect are normal. Patient exhibits appropriate insight and judgment.  ____________________________________________    LABS (pertinent positives/negatives)  Labs Reviewed  LIPASE, BLOOD - Abnormal; Notable for the following:    Lipase 69 (*)    All other components within normal limits  COMPREHENSIVE METABOLIC PANEL - Abnormal; Notable for the following:    Sodium 130 (*)    Glucose, Bld 131 (*)    BUN 25 (*)    Creatinine, Ser 1.26 (*)    Total Bilirubin 1.3 (*)    GFR calc non Af Amer 37 (*)    GFR calc Af Amer 43 (*)    All other components within normal limits  CBC - Abnormal; Notable for the following:    RDW 14.9 (*)    Platelets 587 (*)    All other components within normal limits  URINALYSIS COMPLETEWITH MICROSCOPIC ([facility] ONLY) - Abnormal; Notable for the following:    Color, Urine YELLOW (*)     APPearance CLEAR (*)    Leukocytes, UA TRACE (*)    Squamous Epithelial / LPF 0-5 (*)    All other components within normal limits    ____________________________________________   EKG  None  ____________________________________________    RADIOLOGY I have personally reviewed any xrays that were ordered on this patient: CT head and pelvis shows no acute abnormalities  ____________________________________________   PROCEDURES  Procedure(s)  performed: none  Critical Care performed:none  ____________________________________________   INITIAL IMPRESSION / ASSESSMENT AND PLAN / ED COURSE  Pertinent labs & imaging results that were available during my care of the patient were reviewed by me and considered in my medical decision making (see chart for details).  Patient presents with right lower quadrant discomfort. She has mild tenderness to palpation. She does report a history of diverticula diagnosed by colonoscopy in that area. We will obtain CT abdomen and pelvis to further evaluate. Her labs are essentially reassuring although she does have a mild elevation of her bilirubin. She has no tenderness to palpation in the right upper quadrant.  CT head and pelvis is unremarkable. I discussed the patient's labs and her findings on the CT with the patient. She is feeling better and has noted significant discomfort at this time. I'll discharge her with nausea medication and recommendation to see GI for further evaluation. She knows to return if worsening pain nausea or vomiting   ____________________________________________   FINAL CLINICAL IMPRESSION(S) / ED DIAGNOSES  Final diagnoses:  Epigastric pain   right lower quadrant abdominal pain   Lavonia Drafts, MD 12/29/14 2303

## 2014-12-29 NOTE — ED Notes (Signed)
Pt in CT.

## 2014-12-29 NOTE — Discharge Instructions (Signed)

## 2014-12-29 NOTE — ED Notes (Signed)
Pt reports right lower abdominal pain that started a few hours; pt denies diarrhea, n/v. Pt reports hx of diverticulitis.

## 2014-12-29 NOTE — ED Notes (Addendum)
Pt states right sided lower abd pain since this afternoon, states hx of diverticulitis and states the pain feels the same, pt also states nausea and acid reflux, pt awake and alert during assessment in no distress, pt states she has not had a good stool in several days

## 2015-01-31 DIAGNOSIS — R252 Cramp and spasm: Secondary | ICD-10-CM | POA: Insufficient documentation

## 2015-01-31 DIAGNOSIS — G629 Polyneuropathy, unspecified: Secondary | ICD-10-CM | POA: Insufficient documentation

## 2015-01-31 DIAGNOSIS — M5412 Radiculopathy, cervical region: Secondary | ICD-10-CM | POA: Insufficient documentation

## 2015-01-31 DIAGNOSIS — G620 Drug-induced polyneuropathy: Secondary | ICD-10-CM | POA: Insufficient documentation

## 2015-02-06 ENCOUNTER — Ambulatory Visit (INDEPENDENT_AMBULATORY_CARE_PROVIDER_SITE_OTHER): Payer: Medicare PPO | Admitting: Family Medicine

## 2015-02-06 ENCOUNTER — Encounter: Payer: Self-pay | Admitting: Family Medicine

## 2015-02-06 VITALS — BP 128/70 | HR 70 | Temp 97.5°F | Resp 12 | Wt 145.8 lb

## 2015-02-06 DIAGNOSIS — K219 Gastro-esophageal reflux disease without esophagitis: Secondary | ICD-10-CM | POA: Insufficient documentation

## 2015-02-06 DIAGNOSIS — M47812 Spondylosis without myelopathy or radiculopathy, cervical region: Secondary | ICD-10-CM | POA: Diagnosis not present

## 2015-02-06 DIAGNOSIS — M4722 Other spondylosis with radiculopathy, cervical region: Secondary | ICD-10-CM

## 2015-02-06 DIAGNOSIS — E039 Hypothyroidism, unspecified: Secondary | ICD-10-CM

## 2015-02-06 NOTE — Progress Notes (Signed)
Name: Gina [name]   MRN: [id number]    DOB: [birth year]   Date:02/06/2015       Progress Note  Subjective  Chief Complaint  Chief Complaint  Patient presents with  . Follow-up    patient wants to fill Dr. Nadine Counts in on her recent G.I and Neurologist visit  . Labs Only    patient may need some blood work    HPI  Went to ER with left arm pain, found to be cervical spine etiology. Then had lower extremity numbness so consulted with Dr. Melrose Nakayama, neurology. Symptoms now stabilized. Got information as to what to expect regarding her symptoms. Working with GI regarding GERD, normal barium swallow this year 2016. Has been seeing a PA at [facility], has not seen a DR and she canceled her EGD because she never met the DR and she didn't want to have a procedure done by someone she had not met face to face.  Planning to find a new GI specialist.  Would like to recheck thyroid panel after the beginning of th year. Needs to switch all meds to [facility], more cost effective. Gastric symptoms improved on Protonix 40 mg one a day.   Past Medical History  Diagnosis Date  . Thyroid disease   . Hypertension   . Hyperlipidemia     history of   . Cataract     Patient Active Problem List   Diagnosis Date Noted  . Cramps of lower extremity 01/31/2015  . Neuropathy ([place]) 01/31/2015  . Cervical nerve root disorder 01/31/2015  . Subacute maxillary sinusitis 12/19/2014  . Pharyngitis 12/19/2014  . CKD (chronic kidney disease), stage III 10/06/2014  . Renal cyst, acquired, right 10/06/2014  . Uterine prolapse 10/06/2014  . Cervical radiculopathy due to degenerative joint disease of spine 10/06/2014  . History of right hip replacement 10/06/2014  . Pneumococcal vaccination declined by patient 10/06/2014  . Hypothyroidism, adult 10/05/2014  . Hypertension goal BP (blood pressure) < 150/90 09/13/2014    Social History  Substance Use Topics  . Smoking status: Former Research scientist (life sciences)  . Smokeless tobacco: Not on  file  . Alcohol Use: No     Current outpatient prescriptions:  .  amLODipine (NORVASC) 5 MG tablet, TAKE ONE TABLET BY MOUTH EVERY DAY, Disp: 90 tablet, Rfl: 3 .  aspirin EC 81 MG tablet, Take by mouth., Disp: , Rfl:  .  Cholecalciferol (VITAMIN D3) 1000 UNITS CAPS, Take by mouth., Disp: , Rfl:  .  Cranberry 500 MG CAPS, Take by mouth., Disp: , Rfl:  .  irbesartan (AVAPRO) 150 MG tablet, Take 1 tablet (150 mg total) by mouth daily., Disp: 60 tablet, Rfl: 5 .  ketoconazole (NIZORAL) 2 % shampoo, , Disp: , Rfl:  .  levothyroxine (SYNTHROID, LEVOTHROID) 88 MCG tablet, Take 1 tablet (88 mcg total) by mouth daily before breakfast., Disp: 90 tablet, Rfl: 2 .  ondansetron (ZOFRAN) 4 MG tablet, Take 1 tablet (4 mg total) by mouth daily as needed for nausea or vomiting., Disp: 20 tablet, Rfl: 1 .  pantoprazole (PROTONIX) 40 MG tablet, , Disp: , Rfl:  .  vitamin E 400 UNIT capsule, Take 400 Units by mouth daily., Disp: , Rfl:   Past Surgical History  Procedure Laterality Date  . Tonsillectomy    . Appendectomy    . Cesarean section    . Joint replacement Right     total hip  . Thyroidectomy      partial    Family History  Problem Relation Age of Onset  . Family history unknown: Yes    No Known Allergies   Review of Systems  CONSTITUTIONAL: No significant weight changes, fever, chills, weakness or fatigue.  CARDIOVASCULAR: No chest pain, chest pressure or chest discomfort. No palpitations or edema.  RESPIRATORY: No shortness of breath, cough or sputum.  GASTROINTESTINAL: No anorexia, nausea, vomiting. No changes in bowel habits. No abdominal pain or blood.  NEUROLOGICAL: No headache, dizziness, syncope, paralysis, ataxia. Yes occasional numbness or tingling in the extremities lower extremities. No memory changes. No change in bowel or bladder control.  MUSCULOSKELETAL: No joint pain. No muscle pain. PSYCHIATRIC: No change in mood. No change in sleep pattern.  ENDOCRINOLOGIC: No  reports of sweating, cold or heat intolerance. No polyuria or polydipsia.     Objective  BP 128/70 mmHg  Pulse 70  Temp(Src) 97.5 F (36.4 C) (Oral)  Resp 12  Wt 145 lb 12.8 oz (66.134 kg)  SpO2 95% Body mass index is 25.01 kg/(m^2).  Physical Exam  Constitutional: Patient appears well-developed and well-nourished. In no distress.   Cardiovascular: Normal rate, regular rhythm and normal heart sounds.  No murmur heard.  Pulmonary/Chest: Effort normal and breath sounds normal. No respiratory distress. Abdomen: Soft, non tender, non distended, normal bowel sounds in all four quadrants, no HSM.  Musculoskeletal: Normal range of motion bilateral UE and LE, no joint effusions. Skin: Skin is warm and dry. No rash noted. No erythema.  Psychiatric: Patient has a stable mood and affect. Behavior is normal in office today. Judgment and thought content normal in office today.   Recent Results (from the past 2160 hour(s))  Lipase, blood     Status: Abnormal   Collection Time: 12/29/14  3:24 PM  Result Value Ref Range   Lipase 69 (H) 11 - 51 U/L  Comprehensive metabolic panel     Status: Abnormal   Collection Time: 12/29/14  3:24 PM  Result Value Ref Range   Sodium 130 (L) 135 - 145 mmol/L   Potassium 4.4 3.5 - 5.1 mmol/L   Chloride 101 101 - 111 mmol/L   CO2 23 22 - 32 mmol/L   Glucose, Bld 131 (H) 65 - 99 mg/dL   BUN 25 (H) 6 - 20 mg/dL   Creatinine, Ser 1.26 (H) 0.44 - 1.00 mg/dL   Calcium 9.6 8.9 - 10.3 mg/dL   Total Protein 7.8 6.5 - 8.1 g/dL   Albumin 4.7 3.5 - 5.0 g/dL   AST 22 15 - 41 U/L   ALT 15 14 - 54 U/L   Alkaline Phosphatase 64 38 - 126 U/L   Total Bilirubin 1.3 (H) 0.3 - 1.2 mg/dL   GFR calc non Af Amer 37 (L) >60 mL/min   GFR calc Af Amer 43 (L) >60 mL/min    Comment: (NOTE) The eGFR has been calculated using the CKD EPI equation. This calculation has not been validated in all clinical situations. eGFR's persistently <60 mL/min signify possible Chronic  Kidney Disease.    Anion gap 6 5 - 15  CBC     Status: Abnormal   Collection Time: 12/29/14  3:24 PM  Result Value Ref Range   WBC 10.2 3.6 - 11.0 K/uL   RBC 4.94 3.80 - 5.20 MIL/uL   Hemoglobin 13.9 12.0 - 16.0 g/dL   HCT 41.9 35.0 - 47.0 %   MCV 84.8 80.0 - 100.0 fL   MCH 28.2 26.0 - 34.0 pg   MCHC 33.2 32.0 - 36.0 g/dL  RDW 14.9 (H) 11.5 - 14.5 %   Platelets 587 (H) 150 - 440 K/uL  Urinalysis complete, with microscopic ([facility] only)     Status: Abnormal   Collection Time: 12/29/14  3:24 PM  Result Value Ref Range   Color, Urine YELLOW (A) YELLOW   APPearance CLEAR (A) CLEAR   Glucose, UA NEGATIVE NEGATIVE mg/dL   Bilirubin Urine NEGATIVE NEGATIVE   Ketones, ur NEGATIVE NEGATIVE mg/dL   Specific Gravity, Urine 1.009 1.005 - 1.030   Hgb urine dipstick NEGATIVE NEGATIVE   pH 5.0 5.0 - 8.0   Protein, ur NEGATIVE NEGATIVE mg/dL   Nitrite NEGATIVE NEGATIVE   Leukocytes, UA TRACE (A) NEGATIVE   RBC / HPF 0-5 0 - 5 RBC/hpf   WBC, UA 0-5 0 - 5 WBC/hpf   Bacteria, UA NONE SEEN NONE SEEN   Squamous Epithelial / LPF 0-5 (A) NONE SEEN     Assessment & Plan  1. Hypothyroidism, adult - TSH - T3, free - T4, free  2. GERD without esophagitis Continue PPI, taper down to prn use. She is considering not proceeding with EGD as her symptoms have improved, but will find new GI specialist.   3. Cervical radiculopathy due to degenerative joint disease of spine Continue conservative therapy, active lifestyle.

## 2015-02-08 ENCOUNTER — Ambulatory Visit: Admission: RE | Admit: 2015-02-08 | Payer: Medicare PPO | Source: Ambulatory Visit | Admitting: Gastroenterology

## 2015-02-08 ENCOUNTER — Encounter: Admission: RE | Payer: Self-pay | Source: Ambulatory Visit

## 2015-02-08 SURGERY — ESOPHAGOGASTRODUODENOSCOPY (EGD) WITH PROPOFOL
Anesthesia: General

## 2015-02-26 DIAGNOSIS — K219 Gastro-esophageal reflux disease without esophagitis: Secondary | ICD-10-CM | POA: Diagnosis not present

## 2015-02-28 ENCOUNTER — Other Ambulatory Visit: Payer: Self-pay

## 2015-02-28 MED ORDER — IRBESARTAN 150 MG PO TABS: 150.0000 mg | ORAL_TABLET | Freq: Every day | ORAL | Status: DC

## 2015-03-11 DIAGNOSIS — L219 Seborrheic dermatitis, unspecified: Secondary | ICD-10-CM | POA: Diagnosis not present

## 2015-03-11 DIAGNOSIS — L720 Epidermal cyst: Secondary | ICD-10-CM | POA: Diagnosis not present

## 2015-03-11 DIAGNOSIS — L570 Actinic keratosis: Secondary | ICD-10-CM | POA: Diagnosis not present

## 2015-03-11 DIAGNOSIS — L821 Other seborrheic keratosis: Secondary | ICD-10-CM | POA: Diagnosis not present

## 2015-03-11 DIAGNOSIS — Z85828 Personal history of other malignant neoplasm of skin: Secondary | ICD-10-CM | POA: Diagnosis not present

## 2015-03-11 DIAGNOSIS — Z1283 Encounter for screening for malignant neoplasm of skin: Secondary | ICD-10-CM | POA: Diagnosis not present

## 2015-03-11 DIAGNOSIS — D225 Melanocytic nevi of trunk: Secondary | ICD-10-CM | POA: Diagnosis not present

## 2015-03-11 DIAGNOSIS — D1800 Hemangioma unspecified site: Secondary | ICD-10-CM | POA: Diagnosis not present

## 2015-03-11 DIAGNOSIS — D485 Neoplasm of uncertain behavior of skin: Secondary | ICD-10-CM | POA: Diagnosis not present

## 2015-03-11 DIAGNOSIS — L578 Other skin changes due to chronic exposure to nonionizing radiation: Secondary | ICD-10-CM | POA: Diagnosis not present

## 2015-03-11 DIAGNOSIS — D229 Melanocytic nevi, unspecified: Secondary | ICD-10-CM | POA: Diagnosis not present

## 2015-03-11 DIAGNOSIS — L300 Nummular dermatitis: Secondary | ICD-10-CM | POA: Diagnosis not present

## 2015-03-13 DIAGNOSIS — H2513 Age-related nuclear cataract, bilateral: Secondary | ICD-10-CM | POA: Diagnosis not present

## 2015-03-19 DIAGNOSIS — H2513 Age-related nuclear cataract, bilateral: Secondary | ICD-10-CM | POA: Diagnosis not present

## 2015-03-20 ENCOUNTER — Encounter: Payer: Self-pay | Admitting: *Deleted

## 2015-03-25 ENCOUNTER — Ambulatory Visit
Admission: RE | Admit: 2015-03-25 | Discharge: 2015-03-25 | Disposition: A | Discharge status: Home / Self Care | Payer: Medicare HMO | Source: Ambulatory Visit | Attending: Ophthalmology | Admitting: Ophthalmology

## 2015-03-25 ENCOUNTER — Encounter: Payer: Self-pay | Admitting: *Deleted

## 2015-03-25 ENCOUNTER — Ambulatory Visit: Payer: Medicare HMO | Admitting: Anesthesiology

## 2015-03-25 ENCOUNTER — Encounter
Admission: RE | Disposition: A | Discharge status: Home / Self Care | Payer: Self-pay | Source: Ambulatory Visit | Attending: Ophthalmology

## 2015-03-25 DIAGNOSIS — Z85828 Personal history of other malignant neoplasm of skin: Secondary | ICD-10-CM | POA: Diagnosis not present

## 2015-03-25 DIAGNOSIS — E890 Postprocedural hypothyroidism: Secondary | ICD-10-CM | POA: Insufficient documentation

## 2015-03-25 DIAGNOSIS — Z96649 Presence of unspecified artificial hip joint: Secondary | ICD-10-CM | POA: Insufficient documentation

## 2015-03-25 DIAGNOSIS — Z884 Allergy status to anesthetic agent status: Secondary | ICD-10-CM | POA: Diagnosis not present

## 2015-03-25 DIAGNOSIS — L719 Rosacea, unspecified: Secondary | ICD-10-CM | POA: Diagnosis not present

## 2015-03-25 DIAGNOSIS — K219 Gastro-esophageal reflux disease without esophagitis: Secondary | ICD-10-CM | POA: Diagnosis not present

## 2015-03-25 DIAGNOSIS — L309 Dermatitis, unspecified: Secondary | ICD-10-CM | POA: Insufficient documentation

## 2015-03-25 DIAGNOSIS — K5790 Diverticulosis of intestine, part unspecified, without perforation or abscess without bleeding: Secondary | ICD-10-CM | POA: Insufficient documentation

## 2015-03-25 DIAGNOSIS — E7800 Pure hypercholesterolemia, unspecified: Secondary | ICD-10-CM | POA: Insufficient documentation

## 2015-03-25 DIAGNOSIS — I10 Essential (primary) hypertension: Secondary | ICD-10-CM | POA: Insufficient documentation

## 2015-03-25 DIAGNOSIS — H2512 Age-related nuclear cataract, left eye: Secondary | ICD-10-CM | POA: Diagnosis not present

## 2015-03-25 DIAGNOSIS — M5410 Radiculopathy, site unspecified: Secondary | ICD-10-CM | POA: Insufficient documentation

## 2015-03-25 DIAGNOSIS — G43909 Migraine, unspecified, not intractable, without status migrainosus: Secondary | ICD-10-CM | POA: Insufficient documentation

## 2015-03-25 DIAGNOSIS — Z87891 Personal history of nicotine dependence: Secondary | ICD-10-CM | POA: Diagnosis not present

## 2015-03-25 DIAGNOSIS — H2513 Age-related nuclear cataract, bilateral: Secondary | ICD-10-CM | POA: Diagnosis not present

## 2015-03-25 DIAGNOSIS — E785 Hyperlipidemia, unspecified: Secondary | ICD-10-CM | POA: Diagnosis not present

## 2015-03-25 HISTORY — DX: Cyst of kidney, acquired: N28.1

## 2015-03-25 HISTORY — DX: Gastro-esophageal reflux disease without esophagitis: K21.9

## 2015-03-25 HISTORY — DX: Uterovaginal prolapse, unspecified: N81.4

## 2015-03-25 HISTORY — DX: Radiculopathy, site unspecified: M54.10

## 2015-03-25 HISTORY — PX: CATARACT EXTRACTION W/PHACO: SHX586

## 2015-03-25 HISTORY — DX: Hypothyroidism, unspecified: E03.9

## 2015-03-25 HISTORY — DX: Rosacea, unspecified: L71.9

## 2015-03-25 SURGERY — PHACOEMULSIFICATION, CATARACT, WITH IOL INSERTION
Anesthesia: Monitor Anesthesia Care | Site: Eye | Laterality: Left | Wound class: Clean

## 2015-03-25 MED ORDER — LIDOCAINE HCL (PF) 4 % IJ SOLN
INTRAMUSCULAR | Status: DC | PRN
  Administered 2015-03-25: 4 mL via OPHTHALMIC

## 2015-03-25 MED ORDER — CYCLOPENTOLATE HCL 2 % OP SOLN
OPHTHALMIC | Status: AC
  Filled 2015-03-25: qty 2

## 2015-03-25 MED ORDER — PHENYLEPHRINE HCL 10 % OP SOLN
OPHTHALMIC | Status: AC
  Filled 2015-03-25: qty 5

## 2015-03-25 MED ORDER — BUPIVACAINE HCL (PF) 0.75 % IJ SOLN
INTRAMUSCULAR | Status: AC
  Filled 2015-03-25: qty 10

## 2015-03-25 MED ORDER — ALFENTANIL 500 MCG/ML IJ INJ
INJECTION | INTRAMUSCULAR | Status: DC | PRN
  Administered 2015-03-25: 500 ug via INTRAVENOUS

## 2015-03-25 MED ORDER — MOXIFLOXACIN HCL 0.5 % OP SOLN
1.0000 [drp] | OPHTHALMIC | Status: AC | PRN
  Administered 2015-03-25 (×3): 1 [drp] via OPHTHALMIC

## 2015-03-25 MED ORDER — CARBACHOL 0.01 % IO SOLN
INTRAOCULAR | Status: DC | PRN
  Administered 2015-03-25: .5 mL via INTRAOCULAR

## 2015-03-25 MED ORDER — CEFUROXIME OPHTHALMIC INJECTION 1 MG/0.1 ML
INJECTION | OPHTHALMIC | Status: AC
  Filled 2015-03-25: qty 0.1

## 2015-03-25 MED ORDER — EPINEPHRINE HCL 1 MG/ML IJ SOLN
INTRAOCULAR | Status: DC | PRN
  Administered 2015-03-25: 1 mL via OPHTHALMIC

## 2015-03-25 MED ORDER — TETRACAINE HCL 0.5 % OP SOLN
OPHTHALMIC | Status: AC
  Filled 2015-03-25: qty 2

## 2015-03-25 MED ORDER — PHENYLEPHRINE HCL 10 % OP SOLN
1.0000 [drp] | OPHTHALMIC | Status: AC | PRN
  Administered 2015-03-25 (×4): 1 [drp] via OPHTHALMIC

## 2015-03-25 MED ORDER — LIDOCAINE HCL (PF) 4 % IJ SOLN
INTRAOCULAR | Status: DC | PRN
  Administered 2015-03-25: .5 mL via OPHTHALMIC

## 2015-03-25 MED ORDER — CYCLOPENTOLATE HCL 2 % OP SOLN
1.0000 [drp] | OPHTHALMIC | Status: AC | PRN
  Administered 2015-03-25 (×4): 1 [drp] via OPHTHALMIC

## 2015-03-25 MED ORDER — EPINEPHRINE HCL 1 MG/ML IJ SOLN
INTRAMUSCULAR | Status: AC
  Filled 2015-03-25: qty 2

## 2015-03-25 MED ORDER — SODIUM CHLORIDE 0.9 % IV SOLN
INTRAVENOUS | Status: DC
  Administered 2015-03-25: 10:00:00 via INTRAVENOUS

## 2015-03-25 MED ORDER — NA CHONDROIT SULF-NA HYALURON 40-17 MG/ML IO SOLN
INTRAOCULAR | Status: DC | PRN
  Administered 2015-03-25: 1 mL via INTRAOCULAR

## 2015-03-25 MED ORDER — TETRACAINE HCL 0.5 % OP SOLN
OPHTHALMIC | Status: DC | PRN
  Administered 2015-03-25: 1 [drp] via OPHTHALMIC

## 2015-03-25 MED ORDER — HYALURONIDASE HUMAN 150 UNIT/ML IJ SOLN
INTRAMUSCULAR | Status: AC
Start: 2015-03-25 — End: 2015-03-25
  Filled 2015-03-25: qty 1

## 2015-03-25 MED ORDER — CEFUROXIME OPHTHALMIC INJECTION 1 MG/0.1 ML
INJECTION | OPHTHALMIC | Status: DC | PRN
  Administered 2015-03-25: .1 mL via INTRACAMERAL

## 2015-03-25 MED ORDER — MOXIFLOXACIN HCL 0.5 % OP SOLN
OPHTHALMIC | Status: DC | PRN
  Administered 2015-03-25: 1 [drp] via OPHTHALMIC

## 2015-03-25 MED ORDER — LIDOCAINE HCL (PF) 4 % IJ SOLN
INTRAMUSCULAR | Status: AC
  Filled 2015-03-25: qty 10

## 2015-03-25 MED ORDER — NA CHONDROIT SULF-NA HYALURON 40-17 MG/ML IO SOLN
INTRAOCULAR | Status: AC
  Filled 2015-03-25: qty 1

## 2015-03-25 MED ORDER — MOXIFLOXACIN HCL 0.5 % OP SOLN
OPHTHALMIC | Status: AC
  Filled 2015-03-25: qty 3

## 2015-03-25 SURGICAL SUPPLY — 30 items
CANNULA ANT/CHMB 27GA (MISCELLANEOUS) ×2 IMPLANT
CORD BIP STRL DISP 12FT (MISCELLANEOUS) ×2 IMPLANT
CUP MEDICINE 2OZ PLAST GRAD ST (MISCELLANEOUS) ×2 IMPLANT
DRAPE XRAY CASSETTE 23X24 (DRAPES) ×2 IMPLANT
ERASER HMR WETFIELD 18G (MISCELLANEOUS) ×2 IMPLANT
GLOVE BIO SURGEON STRL SZ8 (GLOVE) ×2 IMPLANT
GLOVE SURG LX 6.5 MICRO (GLOVE) ×1
GLOVE SURG LX 8.0 MICRO (GLOVE) ×1
GLOVE SURG LX STRL 6.5 MICRO (GLOVE) ×1 IMPLANT
GLOVE SURG LX STRL 8.0 MICRO (GLOVE) ×1 IMPLANT
GOWN STRL REUS W/ TWL LRG LVL3 (GOWN DISPOSABLE) ×1 IMPLANT
GOWN STRL REUS W/ TWL XL LVL3 (GOWN DISPOSABLE) ×1 IMPLANT
GOWN STRL REUS W/TWL LRG LVL3 (GOWN DISPOSABLE) ×1
GOWN STRL REUS W/TWL XL LVL3 (GOWN DISPOSABLE) ×1
LENS IOL ACRSF IQ ULTRA 23.5 (Intraocular Lens) ×1 IMPLANT
LENS IOL ACRYSOF IQ 23.5 (Intraocular Lens) ×2 IMPLANT
PACK CATARACT (MISCELLANEOUS) ×2 IMPLANT
PACK CATARACT DINGLEDEIN LX (MISCELLANEOUS) ×2 IMPLANT
PACK EYE AFTER SURG (MISCELLANEOUS) ×2 IMPLANT
SHLD EYE VISITEC  UNIV (MISCELLANEOUS) ×2 IMPLANT
SOL BSS BAG (MISCELLANEOUS) ×2
SOL PREP PVP 2OZ (MISCELLANEOUS) ×2
SOLUTION BSS BAG (MISCELLANEOUS) ×1 IMPLANT
SOLUTION PREP PVP 2OZ (MISCELLANEOUS) ×1 IMPLANT
SUT SILK 5-0 (SUTURE) ×2 IMPLANT
SYR 3ML LL SCALE MARK (SYRINGE) ×2 IMPLANT
SYR 5ML LL (SYRINGE) ×2 IMPLANT
SYR TB 1ML 27GX1/2 LL (SYRINGE) ×2 IMPLANT
WATER STERILE IRR 1000ML POUR (IV SOLUTION) ×2 IMPLANT
WIPE NON LINTING 3.25X3.25 (MISCELLANEOUS) ×2 IMPLANT

## 2015-03-25 NOTE — Op Note (Signed)
Date of Surgery: 03/25/2015 Date of Dictation: 03/25/2015 11:48 AM Pre-operative Diagnosis:  Nuclear Sclerotic Cataract left Eye Post-operative Diagnosis: same Procedure performed: Extra-capsular Cataract Extraction (ECCE) with placement of a posterior chamber intraocular lens (IOL) left Eye IOL:  Implant Name Type Inv. Item Serial No. Manufacturer Lot No. LRB No. Used  LENS IOL ACRYSOF IQ 23.5 - [id number] Intraocular Lens LENS IOL ACRYSOF IQ 23.5 [id number] ALCON   Left 1   Anesthesia: 2% Lidocaine and 4% Marcaine in a 50/50 mixture with 10 unites/ml of Hylenex given as a peribulbar Anesthesiologist: Anesthesiologist: Molli Barrows, MD CRNA: Jonna Clark, CRNA Complications: none Estimated Blood Loss: less than 1 ml  Description of procedure:  The patient was given anesthesia and sedation via intravenous access. The patient was then prepped and draped in the usual fashion. A 25-gauge needle was bent for initiating the capsulorhexis. A 5-0 silk suture was placed through the conjunctiva superior and inferiorly to serve as bridle sutures. Hemostasis was obtained at the superior limbus using an eraser cautery. A partial thickness groove was made at the anterior surgical limbus with a 64 Beaver blade and this was dissected anteriorly with an [employer]aya. The anterior chamber was entered at 10 o'clock with a 1.0 mm paracentesis knife and through the lamellar dissection with a 2.6 mm Alcon keratome. Epi-Shugarcaine 0.5 CC [9 cc BSS Plus (Alcon), 3 cc 4% preservative-free lidocaine (Hospira) and 4 cc 1:1000 preservative-free, bisulfite-free epinephrine] was injected into the anterior chamber via the paracentesis tract. Epi-Shugarcaine 0.5 CC [9 cc BSS Plus (Alcon), 3 cc 4% preservative-free lidocaine (Hospira) and 4 cc 1:1000 preservative-free, bisulfite-free epinephrine] was injected into the anterior chamber via the paracentesis tract. DiscoVisc was injected to replace the aqueous and a  continuous tear curvilinear capsulorhexis was performed using a bent 25-gauge needle.  Balance salt on a syringe was used to perform hydro-dissection and phacoemulsification was carried out using a divide and conquer technique. Procedure(s) with comments: CATARACT EXTRACTION PHACO AND INTRAOCULAR LENS PLACEMENT ([facility]) (Left) - [place] 02:09 AP% 27.3 CDE 59.97 fluid pack lot [id number]. Irrigation/aspiration was used to remove the residual cortex and the capsular bag was inflated with DiscoVisc. The intraocular lens was inserted into the capsular bag using a pre-loaded UltraSert Delivery System. Irrigation/aspiration was used to remove the residual DiscoVisc. The wound was inflated with balanced salt and checked for leaks. None were found. Miostat was injected via the paracentesis track and 0.1 ml of cefuroxime containing 1 mg of drug  was injected via the paracentesis track. The wound was checked for leaks again and none were found.   The bridal sutures were removed and two drops of Vigamox were placed on the eye. An eye shield was placed to protect the eye and the patient was discharged to the recovery area in good condition.   Rowan Pollman MD

## 2015-03-25 NOTE — Transfer of Care (Signed)
Immediate Anesthesia Transfer of Care Note  Patient: Gina [name]  Procedure(s) Performed: Procedure(s) with comments: CATARACT EXTRACTION PHACO AND INTRAOCULAR LENS PLACEMENT ([place]) (Left) - [place] 02:09 AP% 27.3 CDE 59.97 fluid pack lot [id number]  Patient Location: PACU and Short Stay  Anesthesia Type:MAC  Level of Consciousness: awake, alert  and oriented  Airway & Oxygen Therapy: Patient Spontanous Breathing  Post-op Assessment: Report given to RN and Post -op Vital signs reviewed and stable  Post vital signs: Reviewed and stable  Last Vitals:  Filed Vitals:   03/25/15 0948 03/25/15 1151  BP: 150/69 154/71  Pulse: 91   Temp:  36.6 C  Resp: 16 16    Complications: No apparent anesthesia complications

## 2015-03-25 NOTE — Discharge Instructions (Signed)
Eye Surgery Discharge Instructions  Expect mild scratchy sensation or mild soreness. DO NOT RUB YOUR EYE!  The day of surgery:  Minimal physical activity, but bed rest is not required  No reading, computer work, or close hand work  No bending, lifting, or straining.  May watch TV  For 24 hours:  No driving, legal decisions, or alcoholic beverages  Safety precautions  Eat anything you prefer: It is better to start with liquids, then soup then solid foods.  _____ Eye patch should be worn until postoperative exam tomorrow.  ____ Solar shield eyeglasses should be worn for comfort in the sunlight/patch while sleeping  Resume all regular medications including aspirin or Coumadin if these were discontinued prior to surgery. You may shower, bathe, shave, or wash your hair. Tylenol may be taken for mild discomfort.  Call your doctor if you experience significant pain, nausea, or vomiting, fever > 101 or other signs of infection. [phone] or [phone] Specific instructions:  Follow-up Information    Follow up with Estill Cotta, MD. Go on 03/26/2015.   Specialty:  Ophthalmology   Why:  Appointment time is set at 10:45 am TOMORROW, For wound re-check   Contact information:   [place] [address] [phone]     AMBULATORY SURGERY  DISCHARGE INSTRUCTIONS   1) The drugs that you were given will stay in your system until tomorrow so for the next 24 hours you should not:  A) Drive an automobile B) Make any legal decisions C) Drink any alcoholic beverage   2) You may resume regular meals tomorrow.  Today it is better to start with liquids and gradually work up to solid foods.  You may eat anything you prefer, but it is better to start with liquids, then soup and crackers, and gradually work up to solid foods.   3) Please notify your doctor immediately if you have any unusual bleeding, trouble breathing, redness and pain at the surgery site,  drainage, fever, or pain not relieved by medication.    4) Additional Instructions:        Please contact your physician with any problems or Same Day Surgery at [phone], Monday through Friday 6 am to 4 pm, or [place] [facility] number at [phone].

## 2015-03-25 NOTE — Anesthesia Preprocedure Evaluation (Signed)
Anesthesia Evaluation  Patient identified by MRN, date of birth, ID band Patient awake    Reviewed: Allergy & Precautions, H&P , NPO status , Patient's Chart, lab work & pertinent test results, reviewed documented beta blocker date and time   Airway Mallampati: II  TM Distance: >3 FB Neck ROM: full    Dental no notable dental hx. (+) Teeth Intact   Pulmonary neg pulmonary ROS, former smoker,    Pulmonary exam normal breath sounds clear to auscultation       Cardiovascular Exercise Tolerance: Good hypertension, negative cardio ROS   Rhythm:regular Rate:Normal     Neuro/Psych  Headaches,  Neuromuscular disease negative neurological ROS  negative psych ROS   GI/Hepatic negative GI ROS, Neg liver ROS, GERD  ,  Endo/Other  negative endocrine ROSdiabetesHypothyroidism   Renal/GU Renal disease     Musculoskeletal   Abdominal   Peds  Hematology negative hematology ROS (+)   Anesthesia Other Findings   Reproductive/Obstetrics negative OB ROS                             Anesthesia Physical Anesthesia Plan  ASA: III  Anesthesia Plan: MAC   Post-op Pain Management:    Induction:   Airway Management Planned:   Additional Equipment:   Intra-op Plan:   Post-operative Plan:   Informed Consent: I have reviewed the patients History and Physical, chart, labs and discussed the procedure including the risks, benefits and alternatives for the proposed anesthesia with the patient or authorized representative who has indicated his/her understanding and acceptance.     Plan Discussed with: CRNA  Anesthesia Plan Comments:         Anesthesia Quick Evaluation

## 2015-03-25 NOTE — Interval H&P Note (Signed)
History and Physical Interval Note:  [id number] AM  Gina [name]  has presented today for surgery, with the diagnosis of CATARACT  The various methods of treatment have been discussed with the patient and family. After consideration of risks, benefits and other options for treatment, the patient has consented to  Procedure(s): CATARACT EXTRACTION PHACO AND INTRAOCULAR LENS PLACEMENT ([place]) (Left) as a surgical intervention .  The patient's history has been reviewed, patient examined, no change in status, stable for surgery.  I have reviewed the patient's chart and labs.  Questions were answered to the patient's satisfaction.     [name]

## 2015-03-25 NOTE — Anesthesia Postprocedure Evaluation (Signed)
Anesthesia Post Note  Patient: Clinical cytogeneticist  Procedure(s) Performed: Procedure(s) (LRB): CATARACT EXTRACTION PHACO AND INTRAOCULAR LENS PLACEMENT ([facility]) (Left)  Patient location during evaluation: Short Stay Anesthesia Type: MAC Level of consciousness: awake and alert Pain management: pain level controlled Vital Signs Assessment: post-procedure vital signs reviewed and stable Respiratory status: spontaneous breathing Cardiovascular status: stable Anesthetic complications: no    Last Vitals:  Filed Vitals:   03/25/15 1150 03/25/15 1151  BP: 154/71 154/71  Pulse: 78   Temp: 36.4 C 36.6 C  Resp: 18 16    Last Pain: There were no vitals filed for this visit.               Lanora Manis

## 2015-03-25 NOTE — Progress Notes (Signed)
To or with ecg leads, shoe covers, hat in place IV right arm - p ox right mid finger Vigamox, oxi tubing sent to OR with patient

## 2015-03-25 NOTE — H&P (Signed)
See scanned note.

## 2015-04-17 DIAGNOSIS — N812 Incomplete uterovaginal prolapse: Secondary | ICD-10-CM | POA: Diagnosis not present

## 2015-04-17 DIAGNOSIS — Z01419 Encounter for gynecological examination (general) (routine) without abnormal findings: Secondary | ICD-10-CM | POA: Diagnosis not present

## 2015-04-17 DIAGNOSIS — N8111 Cystocele, midline: Secondary | ICD-10-CM | POA: Diagnosis not present

## 2015-04-17 DIAGNOSIS — Z1231 Encounter for screening mammogram for malignant neoplasm of breast: Secondary | ICD-10-CM | POA: Diagnosis not present

## 2015-04-18 DIAGNOSIS — Z961 Presence of intraocular lens: Secondary | ICD-10-CM | POA: Diagnosis not present

## 2015-05-10 ENCOUNTER — Encounter: Payer: Self-pay | Admitting: Family Medicine

## 2015-05-23 DIAGNOSIS — I10 Essential (primary) hypertension: Secondary | ICD-10-CM | POA: Diagnosis not present

## 2015-05-23 DIAGNOSIS — N183 Chronic kidney disease, stage 3 (moderate): Secondary | ICD-10-CM | POA: Diagnosis not present

## 2015-05-24 ENCOUNTER — Encounter: Payer: Self-pay | Admitting: Emergency Medicine

## 2015-05-24 ENCOUNTER — Emergency Department
Admission: EM | Admit: 2015-05-24 | Discharge: 2015-05-24 | Disposition: A | Discharge status: Home / Self Care | Payer: Medicare HMO | Attending: Student | Admitting: Student

## 2015-05-24 DIAGNOSIS — N183 Chronic kidney disease, stage 3 (moderate): Secondary | ICD-10-CM | POA: Insufficient documentation

## 2015-05-24 DIAGNOSIS — Z7982 Long term (current) use of aspirin: Secondary | ICD-10-CM | POA: Diagnosis not present

## 2015-05-24 DIAGNOSIS — Z87891 Personal history of nicotine dependence: Secondary | ICD-10-CM | POA: Insufficient documentation

## 2015-05-24 DIAGNOSIS — Z79899 Other long term (current) drug therapy: Secondary | ICD-10-CM | POA: Diagnosis not present

## 2015-05-24 DIAGNOSIS — I129 Hypertensive chronic kidney disease with stage 1 through stage 4 chronic kidney disease, or unspecified chronic kidney disease: Secondary | ICD-10-CM | POA: Insufficient documentation

## 2015-05-24 DIAGNOSIS — I10 Essential (primary) hypertension: Secondary | ICD-10-CM | POA: Diagnosis not present

## 2015-05-24 HISTORY — DX: Procedure and treatment not carried out because of other contraindication: Z53.09

## 2015-05-24 LAB — CBC
HCT: 43.1 % (ref 35.0–47.0)
Hemoglobin: 14.2 g/dL (ref 12.0–16.0)
MCH: 27.8 pg (ref 26.0–34.0)
MCHC: 33 g/dL (ref 32.0–36.0)
MCV: 84.1 fL (ref 80.0–100.0)
PLATELETS: 560 10*3/uL — AB (ref 150–440)
RBC: 5.12 MIL/uL (ref 3.80–5.20)
RDW: 15.3 % — AB (ref 11.5–14.5)
WBC: 9.1 10*3/uL (ref 3.6–11.0)

## 2015-05-24 LAB — BASIC METABOLIC PANEL
ANION GAP: 6 (ref 5–15)
BUN: 27 mg/dL — ABNORMAL HIGH (ref 6–20)
CALCIUM: 9.5 mg/dL (ref 8.9–10.3)
CO2: 26 mmol/L (ref 22–32)
CREATININE: 1.12 mg/dL — AB (ref 0.44–1.00)
Chloride: 103 mmol/L (ref 101–111)
GFR calc Af Amer: 50 mL/min — ABNORMAL LOW (ref >60)
GFR, EST NON AFRICAN AMERICAN: 43 mL/min — AB (ref >60)
GLUCOSE: 121 mg/dL — AB (ref 65–99)
Potassium: 4.1 mmol/L (ref 3.5–5.1)
Sodium: 135 mmol/L (ref 135–145)

## 2015-05-24 LAB — TROPONIN I: Troponin I: <0.03 ng/mL (ref <0.031)

## 2015-05-24 NOTE — ED Provider Notes (Signed)
[facility] Emergency Department Provider Note  ____________________________________________  Time seen: Approximately 11:06 AM  I have reviewed the triage vital signs and the nursing notes.   HISTORY  Chief Complaint Hypertension    HPI Gina [name] is a 82 y.o. female history of hypertension, hypothyroidism, hyperlipidemia, GERD, benign renal cyst who presents for evaluation of asymptomatic hypertension which possibly began last night, gradual onset, constant since onset, currently moderate, no modifying factors. Patient reports that she checked her blood pressure last night in her systolic was in the [id number]. She was seen by her nephrologist this morning for routine evaluation and her systolic blood pressure was again in the 170s so she presents to the emergency department for evaluation. She denies any chest pain, difficulty breathing, numbness, weakness, headache, vision change or any other complaints. No fevers or chills. She has been compliant with all her medications.   Past Medical History  Diagnosis Date  . Thyroid disease   . Hypertension   . Hyperlipidemia     history of   . Cataract   . Headache   . Radiculopathy   . GERD (gastroesophageal reflux disease)   . Renal cyst   . Cancer ([place])     SKIN  . Hypothyroidism   . Dermatitis   . Rosacea   . Prolapse of uterus   . MRI contraindicated due to metal implant     Patient Active Problem List   Diagnosis Date Noted  . GERD without esophagitis 02/06/2015  . Cramps of lower extremity 01/31/2015  . Neuropathy ([place]) 01/31/2015  . Cervical nerve root disorder 01/31/2015  . Subacute maxillary sinusitis 12/19/2014  . Pharyngitis 12/19/2014  . CKD (chronic kidney disease), stage III 10/06/2014  . Renal cyst, acquired, right 10/06/2014  . Uterine prolapse 10/06/2014  . Cervical radiculopathy due to degenerative joint disease of spine 10/06/2014  . History of right hip replacement 10/06/2014  .  Pneumococcal vaccination declined by patient 10/06/2014  . Hypothyroidism, adult 10/05/2014  . Hypertension goal BP (blood pressure) < 150/90 09/13/2014    Past Surgical History  Procedure Laterality Date  . Tonsillectomy    . Appendectomy    . Cesarean section    . Joint replacement Right     total hip  . Thyroidectomy      partial  . Colon surgery    . Cataract extraction w/phaco Left 03/25/2015    Procedure: CATARACT EXTRACTION PHACO AND INTRAOCULAR LENS PLACEMENT (IOC);  Surgeon: Estill Cotta, MD;  Location: [facility] ORS;  Service: Ophthalmology;  Laterality: Left;  [place] 02:09 AP% 27.3 CDE 59.97 fluid pack lot [id number]    Current Outpatient Rx  Name  Route  Sig  Dispense  Refill  . amLODipine (NORVASC) 5 MG tablet      TAKE ONE TABLET BY MOUTH EVERY DAY   90 tablet   3   . aspirin EC 81 MG tablet   Oral   Take by mouth.         . Cholecalciferol (VITAMIN D3) 1000 UNITS CAPS   Oral   Take by mouth.         . Cranberry 500 MG CAPS   Oral   Take by mouth.         . irbesartan (AVAPRO) 150 MG tablet   Oral   Take 1 tablet (150 mg total) by mouth daily.   90 tablet   5   . levothyroxine (SYNTHROID, LEVOTHROID) 88 MCG tablet   Oral   Take  1 tablet (88 mcg total) by mouth daily before breakfast.   90 tablet   2   . pantoprazole (PROTONIX) 40 MG tablet      20 mg daily. Reported on 03/25/2015         . ranitidine (ZANTAC) 150 MG tablet   Oral   Take 150 mg by mouth daily. AS NEEDED         . vitamin E 400 UNIT capsule   Oral   Take 400 Units by mouth daily.           Allergies Novocain  Family History  Problem Relation Age of Onset  . Family history unknown: Yes    Social History Social History  Substance Use Topics  . Smoking status: Former Research scientist (life sciences)  . Smokeless tobacco: None  . Alcohol Use: No    Review of Systems Constitutional: No fever/chills Eyes: No visual changes. ENT: No sore throat. Cardiovascular: Denies chest  pain. Respiratory: Denies shortness of breath. Gastrointestinal: No abdominal pain.  No nausea, no vomiting.  No diarrhea.  No constipation. Genitourinary: Negative for dysuria. Musculoskeletal: Negative for back pain. Skin: Negative for rash. Neurological: Negative for headaches, focal weakness or numbness.  10-point ROS otherwise negative.  ____________________________________________   PHYSICAL EXAM:  Filed Vitals:   05/24/15 0938 05/24/15 1147 05/24/15 1147  BP: 175/69 149/65 149/65  Pulse: 85  71  Temp: 98 F (36.7 C)    TempSrc: Oral    Resp: 18  18  SpO2: 98%  100%    VITAL SIGNS: ED Triage Vitals  Enc Vitals Group     BP 05/24/15 0938 175/69 mmHg     Pulse Rate 05/24/15 0938 85     Resp 05/24/15 0938 18     Temp 05/24/15 0938 98 F (36.7 C)     Temp Source 05/24/15 0938 Oral     SpO2 05/24/15 0938 98 %     Weight --      Height --      Head Cir --      Peak Flow --      Pain Score --      Pain Loc --      Pain Edu? --      Excl. in [place]? --     Constitutional: Alert and oriented. Well appearing and in no acute distress. Eyes: Conjunctivae are normal. PERRL. EOMI. Head: Atraumatic. Nose: No congestion/rhinnorhea. Mouth/Throat: Mucous membranes are moist.  Oropharynx non-erythematous. Neck: No stridor.  Supple without meningismus. Cardiovascular: Normal rate, regular rhythm. Grossly normal heart sounds.  Good peripheral circulation. Respiratory: Normal respiratory effort.  No retractions. Lungs CTAB. Gastrointestinal: Soft and nontender. No distention.  No CVA tenderness. Genitourinary: deferred Musculoskeletal: No lower extremity tenderness nor edema.  No joint effusions. Neurologic:  Normal speech and language. No gross focal neurologic deficits are appreciated. No gait instability. 5 out of 5 strength in bilateral upper and lower extremity, sensation intact to light touch throughout. Cranial nerves II through XII intact. Skin:  Skin is warm, dry and  intact. No rash noted. Psychiatric: Mood and affect are normal. Speech and behavior are normal.  ____________________________________________   LABS (all labs ordered are listed, but only abnormal results are displayed)  Labs Reviewed  BASIC METABOLIC PANEL - Abnormal; Notable for the following:    Glucose, Bld 121 (*)    BUN 27 (*)    Creatinine, Ser 1.12 (*)    GFR calc non Af Amer 43 (*)    GFR  calc Af Amer 50 (*)    All other components within normal limits  CBC - Abnormal; Notable for the following:    RDW 15.3 (*)    Platelets 560 (*)    All other components within normal limits  TROPONIN I   ____________________________________________  EKG  ED ECG REPORT I, Joanne Gavel, the attending physician, personally viewed and interpreted this ECG.   Date: 05/24/2015  EKG Time: 09:44  Rate: 74  Rhythm: normal sinus rhythm  Axis: normal  Intervals:none  ST&T Change: No acute ST elevation. No acute ST depression. Nonspecific T-wave abnormality in aVL, V2.  ____________________________________________  RADIOLOGY  none ____________________________________________   PROCEDURES  Procedure(s) performed: None  Critical Care performed: No  ____________________________________________   INITIAL IMPRESSION / ASSESSMENT AND PLAN / ED COURSE  Pertinent labs & imaging results that were available during my care of the patient were reviewed by me and considered in my medical decision making (see chart for details).  Gina [name] is a 82 y.o. female history of hypertension, hypothyroidism, hyperlipidemia, GERD, benign renal cyst who presents for evaluation of asymptomatic hypertension which possibly began last night. On exam, she is very well-appearing and in no acute distress. Vital signs stable, she is afebrile. She does have hypertension with blood pressure 175/69 which is improved to 149/65 at the time of discharge without any intervention in the emergency department.  She is completely asymptomatic and has a benign physical examination, intact neurological examination. EKG with no acute ischemic change. BMP with mild creatinine elevation of 1.12 however this is the patient's baseline. Negative troponin. Unremarkable CBC. We discussed appropriate blood pressure monitoring and need for close PCP follow-up. She has an appointment scheduled with Dr. Steva Ready for Tuesday at which time her primary care doctor can titrate her antihypertensive medications. We discussed meticulous return precautions, need for close follow-up and she is comfortable with the discharge plan. DC home. ____________________________________________   FINAL CLINICAL IMPRESSION(S) / ED DIAGNOSES  Final diagnoses:  Essential hypertension      Joanne Gavel, MD 05/24/15 1208

## 2015-05-24 NOTE — ED Notes (Signed)
Pt presents with high blood pressure this am and states she did take her meds. Denies any vision changes, no headache.

## 2015-05-28 ENCOUNTER — Encounter: Payer: Self-pay | Admitting: Family Medicine

## 2015-05-28 ENCOUNTER — Ambulatory Visit (INDEPENDENT_AMBULATORY_CARE_PROVIDER_SITE_OTHER): Payer: Medicare HMO | Admitting: Family Medicine

## 2015-05-28 VITALS — BP 148/78 | HR 87 | Temp 98.3°F | Resp 14 | Wt 145.0 lb

## 2015-05-28 DIAGNOSIS — N183 Chronic kidney disease, stage 3 unspecified: Secondary | ICD-10-CM

## 2015-05-28 DIAGNOSIS — D75839 Thrombocytosis, unspecified: Secondary | ICD-10-CM | POA: Insufficient documentation

## 2015-05-28 DIAGNOSIS — I10 Essential (primary) hypertension: Secondary | ICD-10-CM

## 2015-05-28 DIAGNOSIS — K5790 Diverticulosis of intestine, part unspecified, without perforation or abscess without bleeding: Secondary | ICD-10-CM | POA: Insufficient documentation

## 2015-05-28 DIAGNOSIS — E039 Hypothyroidism, unspecified: Secondary | ICD-10-CM

## 2015-05-28 DIAGNOSIS — R42 Dizziness and giddiness: Secondary | ICD-10-CM

## 2015-05-28 DIAGNOSIS — D473 Essential (hemorrhagic) thrombocythemia: Secondary | ICD-10-CM | POA: Diagnosis not present

## 2015-05-28 DIAGNOSIS — K219 Gastro-esophageal reflux disease without esophagitis: Secondary | ICD-10-CM

## 2015-05-28 DIAGNOSIS — N814 Uterovaginal prolapse, unspecified: Secondary | ICD-10-CM | POA: Diagnosis not present

## 2015-05-28 DIAGNOSIS — K5730 Diverticulosis of large intestine without perforation or abscess without bleeding: Secondary | ICD-10-CM | POA: Diagnosis not present

## 2015-05-28 DIAGNOSIS — Z5181 Encounter for therapeutic drug level monitoring: Secondary | ICD-10-CM | POA: Diagnosis not present

## 2015-05-28 HISTORY — DX: Diverticulosis of intestine, part unspecified, without perforation or abscess without bleeding: K57.90

## 2015-05-28 NOTE — Patient Instructions (Addendum)
It is recommend that women get 1000 iu of vitamin D3 daily Okay with me to stop vitamin E Okay to continue cranberry Try to get a new or recalibrated blood pressure cuff Contact your ENT doctor, Dr. Tami Ribas, about your symptoms as I suspect the dizziness is something he can help you with Do have fasting labs at your convenience    [place] stands for "Dietary Approaches to Stop Hypertension." The DASH eating plan is a healthy eating plan that has been shown to reduce high blood pressure (hypertension). Additional health benefits may include reducing the risk of type 2 diabetes mellitus, heart disease, and stroke. The DASH eating plan may also help with weight loss. WHAT DO I NEED TO KNOW ABOUT THE DASH EATING PLAN? For the DASH eating plan, you will follow these general guidelines:  Choose foods with a percent daily value for sodium of less than 5% (as listed on the food label).  Use salt-free seasonings or herbs instead of table salt or sea salt.  Check with your health care provider or pharmacist before using salt substitutes.  Eat lower-sodium products, often labeled as "lower sodium" or "no salt added."  Eat fresh foods.  Eat more vegetables, fruits, and low-fat dairy products.  Choose whole grains. Look for the word "whole" as the first word in the ingredient list.  Choose fish and skinless chicken or [place] more often than red meat. Limit fish, poultry, and meat to 6 oz (170 g) each day.  Limit sweets, desserts, sugars, and sugary drinks.  Choose heart-healthy fats.  Limit cheese to 1 oz (28 g) per day.  Eat more home-cooked food and less restaurant, buffet, and fast food.  Limit fried foods.  Cook foods using methods other than frying.  Limit canned vegetables. If you do use them, rinse them well to decrease the sodium.  When eating at a restaurant, ask that your food be prepared with less salt, or no salt if possible. WHAT FOODS CAN I EAT? Seek help  from a dietitian for individual calorie needs. Grains Whole grain or whole wheat bread. Brown rice. Whole grain or whole wheat pasta. Quinoa, bulgur, and whole grain cereals. Low-sodium cereals. Corn or whole wheat flour tortillas. Whole grain cornbread. Whole grain crackers. Low-sodium crackers. Vegetables Fresh or frozen vegetables (raw, steamed, roasted, or grilled). Low-sodium or reduced-sodium tomato and vegetable juices. Low-sodium or reduced-sodium tomato sauce and paste. Low-sodium or reduced-sodium canned vegetables.  Fruits All fresh, canned (in natural juice), or frozen fruits. Meat and Other Protein Products Ground beef (85% or leaner), grass-fed beef, or beef trimmed of fat. Skinless chicken or [place]. Ground chicken or [place]. Pork trimmed of fat. All fish and seafood. Eggs. Dried beans, peas, or lentils. Unsalted nuts and seeds. Unsalted canned beans. Dairy Low-fat dairy products, such as skim or 1% milk, 2% or reduced-fat cheeses, low-fat ricotta or cottage cheese, or plain low-fat yogurt. Low-sodium or reduced-sodium cheeses. Fats and Oils Tub margarines without trans fats. Light or reduced-fat mayonnaise and salad dressings (reduced sodium). Avocado. Safflower, olive, or canola oils. Natural peanut or almond butter. Other Unsalted popcorn and pretzels. The items listed above may not be a complete list of recommended foods or beverages. Contact your dietitian for more options. WHAT FOODS ARE NOT RECOMMENDED? Grains White bread. White pasta. White rice. Refined cornbread. Bagels and croissants. Crackers that contain trans fat. Vegetables Creamed or fried vegetables. Vegetables in a cheese sauce. Regular canned vegetables. Regular canned tomato sauce and paste. Regular  tomato and vegetable juices. Fruits Dried fruits. Canned fruit in light or heavy syrup. Fruit juice. Meat and Other Protein Products Fatty cuts of meat. Ribs, chicken wings, bacon, sausage, bologna, salami,  chitterlings, fatback, hot dogs, bratwurst, and packaged luncheon meats. Salted nuts and seeds. Canned beans with salt. Dairy Whole or 2% milk, cream, half-and-half, and cream cheese. Whole-fat or sweetened yogurt. Full-fat cheeses or blue cheese. Nondairy creamers and whipped toppings. Processed cheese, cheese spreads, or cheese curds. Condiments Onion and garlic salt, seasoned salt, table salt, and sea salt. Canned and packaged gravies. Worcestershire sauce. Tartar sauce. Barbecue sauce. Teriyaki sauce. Soy sauce, including reduced sodium. Steak sauce. Fish sauce. Oyster sauce. Cocktail sauce. Horseradish. Ketchup and mustard. Meat flavorings and tenderizers. Bouillon cubes. Hot sauce. Tabasco sauce. Marinades. Taco seasonings. Relishes. Fats and Oils Butter, stick margarine, lard, shortening, ghee, and bacon fat. Coconut, palm kernel, or palm oils. Regular salad dressings. Other Pickles and olives. Salted popcorn and pretzels. The items listed above may not be a complete list of foods and beverages to avoid. Contact your dietitian for more information. WHERE CAN I FIND MORE INFORMATION? National Heart, Lung, and Blood Institute: travelstabloid.com   This information is not intended to replace advice given to you by your health care provider. Make sure you discuss any questions you have with your health care provider.   Document Released: 01/29/2011 Document Revised: 03/02/2014 Document Reviewed: 12/14/2012 Elsevier Interactive Patient Education [employer].

## 2015-05-28 NOTE — Progress Notes (Signed)
BP 148/78 mmHg  Pulse 87  Temp(Src) 98.3 F (36.8 C) (Oral)  Resp 14  Wt 145 lb (65.772 kg)  SpO2 95%   Subjective:    Patient ID: Gina [name], female    DOB: [birth year], 82 y.o.   MRN: [id number]  HPI: Gina [name] is a 82 y.o. female  Chief Complaint  Patient presents with  . Hypertension    Has been running high went to the ER saturday. EKG normal, did not adjust meds. Feeling lightheaded   Patient is new to me (I just joined this practice Monday); she has high blood pressure and it was found to be elevated recently; she went to the ER to get checked out; BP 123456 systolic at the ER; [id number] here; recheck 148/78  She has questions about supplements; she takes aspirin, vit D, vit E, and cranberry; renal cyst; they watch the cyst on the right side  She asked question about calibrating her BP cuff; it appears that her cuff is reading about 20-30 points lower at home that at the ER and doctor's office  She has her cataract extracted end of Feb on the left; off kilter just 7 days ago or so; llike she can't get centered; almost feeling a little nauseated; does not think she is focusing correctly; nausea at times; going to see ophthalmologist on Thursday; she has an ENT specialist; hx of vertigo; has had EKG; has not had any recent carotid  Has radiculoapthy; left 4th and 5th fingers; was evaluated by ER and found to be a nerve problem  She has neuropathy in the legs; from the knees down  She has a kidney specialist, Dr. Candiss Norse  We reviewed her labs and found elevated platelets from 5 months ago, and again most recently; she does not recall anything about that in particular CBC  Status: Finalresult Visible to patient:  Not Released Nextappt: None              Ref Range 9d ago  52mo ago  100yr ago     WBC 3.6 - 11.0 K/uL 9.1 10.2 8.3R    RBC 3.80 - 5.20 MIL/uL 5.12 4.94 4.76R    Hemoglobin 12.0 - 16.0 g/dL 14.2 13.9     HCT 35.0 - 47.0 % 43.1 41.9     MCV  80.0 - 100.0 fL 84.1 84.8 86R    MCH 26.0 - 34.0 pg 27.8 28.2 28.1R    MCHC 32.0 - 36.0 g/dL 33.0 33.2 32.6R    RDW 11.5 - 14.5 % 15.3 (H) 14.9 (H) 15.0 (H)R    Platelets 150 - 440 K/uL 560 (H) 587 (H) 439R         She has a right hip replacement; she hurts back there  She has a GI specialist  GYN, Dr. Kenton Kingfisher, pessary, cleaned every 3 months; mammo UTD  Relevant past medical, surgical, family and social history reviewed and updated as indicated Past Medical History  Diagnosis Date  . Thyroid disease   . Hypertension   . Hyperlipidemia     history of   . Cataract   . Headache   . Radiculopathy   . GERD (gastroesophageal reflux disease)   . Renal cyst   . Cancer ([place])     SKIN  . Hypothyroidism   . Dermatitis   . Rosacea   . Prolapse of uterus   . MRI contraindicated due to metal implant   . Diverticulosis 05/28/2015   Past Surgical History  Procedure Laterality  Date  . Tonsillectomy    . Appendectomy    . Cesarean section    . Joint replacement Right     total hip  . Thyroidectomy      partial  . Colon surgery    . Cataract extraction w/phaco Left 03/25/2015    Procedure: CATARACT EXTRACTION PHACO AND INTRAOCULAR LENS PLACEMENT (IOC);  Surgeon: Estill Cotta, MD;  Location: [facility] ORS;  Service: Ophthalmology;  Laterality: Left;  [place] 02:09 AP% 27.3 CDE 59.97 fluid pack lot [id number]   Family History  Problem Relation Age of Onset  . Family history unknown: Yes  (will clarify at next visit)  Social History  Substance Use Topics  . Smoking status: Former Research scientist (life sciences)  . Smokeless tobacco: None  . Alcohol Use: No   Allergies and medications reviewed  Review of Systems Per HPI unless specifically indicated above     Objective:    BP 148/78 mmHg  Pulse 87  Temp(Src) 98.3 F (36.8 C) (Oral)  Resp 14  Wt 145 lb (65.772 kg)  SpO2 95%  Wt Readings from Last 3 Encounters:  05/28/15 145 lb (65.772 kg)  03/25/15 145 lb (65.772 kg)  02/06/15 145 lb 12.8  oz (66.134 kg)    Physical Exam  Constitutional: She appears well-developed and well-nourished. No distress.  HENT:  Head: Normocephalic and atraumatic.  Eyes: EOM are normal. No scleral icterus.  Neck: Carotid bruit is not present. No thyromegaly present.  Cardiovascular: Normal rate, regular rhythm and normal heart sounds.   Pulmonary/Chest: Effort normal and breath sounds normal. No respiratory distress. She has no wheezes.  Abdominal: She exhibits no distension.  Musculoskeletal: Normal range of motion. She exhibits no edema.  Neurological: She is alert.  Skin: Skin is warm and dry. She is not diaphoretic. No pallor.  Psychiatric: She has a normal mood and affect. Her behavior is normal. Judgment and thought content normal.    Results for orders placed or performed during the hospital encounter of 0000000  Basic metabolic panel  Result Value Ref Range   Sodium 135 135 - 145 mmol/L   Potassium 4.1 3.5 - 5.1 mmol/L   Chloride 103 101 - 111 mmol/L   CO2 26 22 - 32 mmol/L   Glucose, Bld 121 (H) 65 - 99 mg/dL   BUN 27 (H) 6 - 20 mg/dL   Creatinine, Ser 1.12 (H) 0.44 - 1.00 mg/dL   Calcium 9.5 8.9 - 10.3 mg/dL   GFR calc non Af Amer 43 (L) >60 mL/min   GFR calc Af Amer 50 (L) >60 mL/min   Anion gap 6 5 - 15  Troponin I  Result Value Ref Range   Troponin I <0.03 <0.031 ng/mL  CBC  Result Value Ref Range   WBC 9.1 3.6 - 11.0 K/uL   RBC 5.12 3.80 - 5.20 MIL/uL   Hemoglobin 14.2 12.0 - 16.0 g/dL   HCT 43.1 35.0 - 47.0 %   MCV 84.1 80.0 - 100.0 fL   MCH 27.8 26.0 - 34.0 pg   MCHC 33.0 32.0 - 36.0 g/dL   RDW 15.3 (H) 11.5 - 14.5 %   Platelets 560 (H) 150 - 440 K/uL      Assessment & Plan:   Problem List Items Addressed This Visit      Cardiovascular and Mediastinum   Hypertension goal BP (blood pressure) < 150/90 - Primary    DASH guidelines; continue CCB and ARB; I do not think her dizziness is the result of high  blood pressure; suggested she get another BP cuff; we'll  be happy to check that against ours here; DASH information in AVS      Relevant Orders   Lipid Panel w/o Chol/HDL Ratio     Digestive   GERD without esophagitis    Managed by Dr. Rayann Heman, GI specialist      Diverticulosis    Without diverticulitis; hx        Endocrine   Hypothyroidism, adult    Check tsh, t4, t3; patient will return fasting for labs      Relevant Orders   T4, free   T3, free   TSH     Genitourinary   CKD (chronic kidney disease), stage III    Monitored by Candiss Norse, avoid NSAIDs      Uterine prolapse    Followed by GYN, pessary        Hematopoietic and Hemostatic   Thrombocytosis ([place])    Not sure of etiology; first noted on labs about 5 months ago; order written to recheck this when she returns      Relevant Orders   CBC with Differential/Platelet     Other   Medication monitoring encounter    Check vit D level since she has been taking supplement; suggested she stop vit E; explained four vitamins are fat soluble, can build up      Relevant Orders   VITAMIN D 25 Hydroxy (Vit-D Deficiency, Fractures)    Other Visit Diagnoses    Dizziness        suspect ENT cause; she will contact her ENT specialist and made an appt to be seen        Follow up plan: No Follow-up on file.   Orders Placed This Encounter  Procedures  . T4, free    Standing Status: Future     Number of Occurrences:      Standing Expiration Date: 06/23/2015  . T3, free    Standing Status: Future     Number of Occurrences:      Standing Expiration Date: 06/23/2015  . TSH    Standing Status: Future     Number of Occurrences:      Standing Expiration Date: 06/23/2015  . VITAMIN D 25 Hydroxy (Vit-D Deficiency, Fractures)    Standing Status: Future     Number of Occurrences:      Standing Expiration Date: 06/23/2015  . Lipid Panel w/o Chol/HDL Ratio    Standing Status: Future     Number of Occurrences:      Standing Expiration Date: 06/23/2015    Order Specific Question:   Has the patient fasted?    Answer:  No  . CBC with Differential/Platelet    Standing Status: Future     Number of Occurrences:      Standing Expiration Date: 06/23/2015   An after-visit summary was printed and given to the patient at [facility].  Please see the patient instructions which may contain other information and recommendations beyond what is mentioned above in the assessment and plan.

## 2015-05-28 NOTE — Assessment & Plan Note (Addendum)
DASH guidelines; continue CCB and ARB; I do not think her dizziness is the result of high blood pressure; suggested she get another BP cuff; we'll be happy to check that against ours here; DASH information in AVS

## 2015-05-28 NOTE — Assessment & Plan Note (Addendum)
Check vit D level since she has been taking supplement; suggested she stop vit E; explained four vitamins are fat soluble, can build up

## 2015-05-28 NOTE — Assessment & Plan Note (Addendum)
Check tsh, t4, t3; patient will return fasting for labs

## 2015-05-28 NOTE — Assessment & Plan Note (Signed)
Monitored by Candiss Norse, avoid NSAIDs

## 2015-05-28 NOTE — Assessment & Plan Note (Addendum)
Managed by Dr. Rayann Heman, GI specialist

## 2015-05-29 ENCOUNTER — Other Ambulatory Visit: Payer: Self-pay | Admitting: Family Medicine

## 2015-05-29 DIAGNOSIS — E039 Hypothyroidism, unspecified: Secondary | ICD-10-CM | POA: Diagnosis not present

## 2015-05-29 DIAGNOSIS — Z5181 Encounter for therapeutic drug level monitoring: Secondary | ICD-10-CM | POA: Diagnosis not present

## 2015-05-29 DIAGNOSIS — I10 Essential (primary) hypertension: Secondary | ICD-10-CM | POA: Diagnosis not present

## 2015-05-29 DIAGNOSIS — D473 Essential (hemorrhagic) thrombocythemia: Secondary | ICD-10-CM | POA: Diagnosis not present

## 2015-05-30 ENCOUNTER — Encounter: Payer: Self-pay | Admitting: Family Medicine

## 2015-05-30 LAB — CBC WITH DIFFERENTIAL/PLATELET
BASOS ABS: 0.1 10*3/uL (ref 0.0–0.2)
Basos: 1 %
EOS (ABSOLUTE): 0.4 10*3/uL (ref 0.0–0.4)
Eos: 4 %
Hematocrit: 43.9 % (ref 34.0–46.6)
Hemoglobin: 14.7 g/dL (ref 11.1–15.9)
Immature Grans (Abs): 0 10*3/uL (ref 0.0–0.1)
Immature Granulocytes: 0 %
LYMPHS ABS: 1.6 10*3/uL (ref 0.7–3.1)
Lymphs: 20 %
MCH: 28.4 pg (ref 26.6–33.0)
MCHC: 33.5 g/dL (ref 31.5–35.7)
MCV: 85 fL (ref 79–97)
MONOS ABS: 0.7 10*3/uL (ref 0.1–0.9)
Monocytes: 8 %
Neutrophils Absolute: 5.4 10*3/uL (ref 1.4–7.0)
Neutrophils: 67 %
PLATELETS: 644 10*3/uL — AB (ref 150–379)
RBC: 5.18 x10E6/uL (ref 3.77–5.28)
RDW: 15.3 % (ref 12.3–15.4)
WBC: 8.2 10*3/uL (ref 3.4–10.8)

## 2015-05-30 LAB — T3, FREE: T3, Free: 2.3 pg/mL (ref 2.0–4.4)

## 2015-05-30 LAB — LIPID PANEL W/O CHOL/HDL RATIO
CHOLESTEROL TOTAL: 268 mg/dL — AB (ref 100–199)
HDL: 49 mg/dL (ref >39)
LDL Calculated: 177 mg/dL — ABNORMAL HIGH (ref 0–99)
TRIGLYCERIDES: 208 mg/dL — AB (ref 0–149)
VLDL Cholesterol Cal: 42 mg/dL — ABNORMAL HIGH (ref 5–40)

## 2015-05-30 LAB — T4, FREE: FREE T4: 1.58 ng/dL (ref 0.82–1.77)

## 2015-05-30 LAB — VITAMIN D 25 HYDROXY (VIT D DEFICIENCY, FRACTURES): Vit D, 25-Hydroxy: 29 ng/mL — ABNORMAL LOW (ref 30.0–100.0)

## 2015-05-30 LAB — TSH: TSH: 2.61 u[IU]/mL (ref 0.450–4.500)

## 2015-06-02 NOTE — Assessment & Plan Note (Signed)
Followed by GYN, pessary

## 2015-06-02 NOTE — Assessment & Plan Note (Signed)
Without diverticulitis; hx

## 2015-06-02 NOTE — Assessment & Plan Note (Signed)
Not sure of etiology; first noted on labs about 5 months ago; order written to recheck this when she returns

## 2015-06-03 ENCOUNTER — Telehealth: Payer: Self-pay | Admitting: Family Medicine

## 2015-06-03 DIAGNOSIS — D473 Essential (hemorrhagic) thrombocythemia: Secondary | ICD-10-CM

## 2015-06-03 DIAGNOSIS — D75839 Thrombocytosis, unspecified: Secondary | ICD-10-CM

## 2015-06-03 DIAGNOSIS — E785 Hyperlipidemia, unspecified: Secondary | ICD-10-CM

## 2015-06-03 DIAGNOSIS — R42 Dizziness and giddiness: Secondary | ICD-10-CM | POA: Insufficient documentation

## 2015-06-03 MED ORDER — ROSUVASTATIN CALCIUM 10 MG PO TABS: 10.0000 mg | ORAL_TABLET | Freq: Every day | ORAL | Status: DC

## 2015-06-03 NOTE — Telephone Encounter (Signed)
I spoke with patient, discussed lab results Platelets climbing; refer to heme; she is taking aspirin daily (I will not add on JAK-2 mutation, labs drawn several days ago, leave work-up to heme) LDL is high; she did not want to hear the number; she knows it's high; had some aches with statins; did not want statins, then reconsidered; she'll start Crestor every other night for one week, then go to every night if tolerated Still having dizziness, but ENT can't see her for 2 more weeks; will get carotid US; reasons to consider TIA/stroke reviewed; F-A-S-T, call 911; continue aspirin Thyroid tests normal; she did not need refill right now

## 2015-06-03 NOTE — Assessment & Plan Note (Signed)
Refer to hemeonc

## 2015-06-05 ENCOUNTER — Ambulatory Visit: Payer: Medicare PPO | Admitting: Family Medicine

## 2015-06-11 ENCOUNTER — Ambulatory Visit: Payer: Medicare HMO | Admitting: Internal Medicine

## 2015-06-17 DIAGNOSIS — L820 Inflamed seborrheic keratosis: Secondary | ICD-10-CM | POA: Diagnosis not present

## 2015-06-17 DIAGNOSIS — L858 Other specified epidermal thickening: Secondary | ICD-10-CM | POA: Diagnosis not present

## 2015-06-19 DIAGNOSIS — M25561 Pain in right knee: Secondary | ICD-10-CM | POA: Diagnosis not present

## 2015-06-19 DIAGNOSIS — M25551 Pain in right hip: Secondary | ICD-10-CM | POA: Diagnosis not present

## 2015-06-19 DIAGNOSIS — M5431 Sciatica, right side: Secondary | ICD-10-CM | POA: Diagnosis not present

## 2015-06-24 ENCOUNTER — Inpatient Hospital Stay: Payer: Medicare HMO

## 2015-06-24 ENCOUNTER — Inpatient Hospital Stay: Payer: Medicare HMO | Attending: Internal Medicine | Admitting: Internal Medicine

## 2015-06-24 ENCOUNTER — Encounter: Payer: Self-pay | Admitting: Internal Medicine

## 2015-06-24 VITALS — BP 165/74 | HR 91 | Temp 96.8°F | Resp 18 | Wt 142.4 lb

## 2015-06-24 DIAGNOSIS — M5431 Sciatica, right side: Secondary | ICD-10-CM | POA: Diagnosis not present

## 2015-06-24 DIAGNOSIS — E039 Hypothyroidism, unspecified: Secondary | ICD-10-CM | POA: Diagnosis not present

## 2015-06-24 DIAGNOSIS — I10 Essential (primary) hypertension: Secondary | ICD-10-CM | POA: Insufficient documentation

## 2015-06-24 DIAGNOSIS — D75839 Thrombocytosis, unspecified: Secondary | ICD-10-CM

## 2015-06-24 DIAGNOSIS — N281 Cyst of kidney, acquired: Secondary | ICD-10-CM | POA: Diagnosis not present

## 2015-06-24 DIAGNOSIS — R110 Nausea: Secondary | ICD-10-CM

## 2015-06-24 DIAGNOSIS — K219 Gastro-esophageal reflux disease without esophagitis: Secondary | ICD-10-CM | POA: Diagnosis not present

## 2015-06-24 DIAGNOSIS — Z79899 Other long term (current) drug therapy: Secondary | ICD-10-CM | POA: Insufficient documentation

## 2015-06-24 DIAGNOSIS — E785 Hyperlipidemia, unspecified: Secondary | ICD-10-CM | POA: Diagnosis not present

## 2015-06-24 DIAGNOSIS — D473 Essential (hemorrhagic) thrombocythemia: Secondary | ICD-10-CM | POA: Insufficient documentation

## 2015-06-24 DIAGNOSIS — N183 Chronic kidney disease, stage 3 (moderate): Secondary | ICD-10-CM | POA: Diagnosis not present

## 2015-06-24 DIAGNOSIS — K5790 Diverticulosis of intestine, part unspecified, without perforation or abscess without bleeding: Secondary | ICD-10-CM | POA: Diagnosis not present

## 2015-06-24 DIAGNOSIS — M545 Low back pain: Secondary | ICD-10-CM | POA: Diagnosis not present

## 2015-06-24 DIAGNOSIS — I129 Hypertensive chronic kidney disease with stage 1 through stage 4 chronic kidney disease, or unspecified chronic kidney disease: Secondary | ICD-10-CM | POA: Diagnosis not present

## 2015-06-24 DIAGNOSIS — M5410 Radiculopathy, site unspecified: Secondary | ICD-10-CM | POA: Insufficient documentation

## 2015-06-24 DIAGNOSIS — Z85828 Personal history of other malignant neoplasm of skin: Secondary | ICD-10-CM | POA: Diagnosis not present

## 2015-06-24 DIAGNOSIS — R51 Headache: Secondary | ICD-10-CM

## 2015-06-24 DIAGNOSIS — L719 Rosacea, unspecified: Secondary | ICD-10-CM | POA: Insufficient documentation

## 2015-06-24 LAB — CBC WITH DIFFERENTIAL/PLATELET
BASOS ABS: 0.1 10*3/uL (ref 0–0.1)
BASOS PCT: 1 %
EOS ABS: 0.1 10*3/uL (ref 0–0.7)
EOS PCT: 1 %
HCT: 42.6 % (ref 35.0–47.0)
Hemoglobin: 14.5 g/dL (ref 12.0–16.0)
LYMPHS ABS: 0.9 10*3/uL — AB (ref 1.0–3.6)
Lymphocytes Relative: 9 %
MCH: 28.6 pg (ref 26.0–34.0)
MCHC: 34.1 g/dL (ref 32.0–36.0)
MCV: 83.9 fL (ref 80.0–100.0)
Monocytes Absolute: 0.6 10*3/uL (ref 0.2–0.9)
Monocytes Relative: 6 %
Neutro Abs: 8.9 10*3/uL — ABNORMAL HIGH (ref 1.4–6.5)
Neutrophils Relative %: 83 %
PLATELETS: 673 10*3/uL — AB (ref 150–440)
RBC: 5.07 MIL/uL (ref 3.80–5.20)
RDW: 15.1 % — ABNORMAL HIGH (ref 11.5–14.5)
WBC: 10.7 10*3/uL (ref 3.6–11.0)

## 2015-06-24 LAB — LACTATE DEHYDROGENASE: LDH: 159 U/L (ref 98–192)

## 2015-06-24 NOTE — Progress Notes (Signed)
[facility] NOTE  Patient Care Team: Arnetha Courser, MD as PCP - General (Family Medicine) Murlean Iba, MD (Internal Medicine) Gae Dry, MD as Referring Physician (Obstetrics and Gynecology) Estill Cotta, MD (Ophthalmology) Josefine Class, MD as Referring Physician (Gastroenterology) Anabel Bene, MD as Referring Physician (Neurology) Beverly Gust, MD (Unknown Physician Specialty)  CHIEF COMPLAINTS/PURPOSE OF CONSULTATION:   # DEC 2016- THROMBOCYTOSIS- 77- 31   # CKD stage III  HISTORY OF PRESENTING ILLNESS:  Gina [name] 82 y.o.  female very pleasant female patient who looks much younger than her stated age has been referred was for further evaluation of elevated platelets.  Patient denies any history of blood clots. Denies any burning pain in the fingertips or toes. Denies any discoloration of the tips and toes. Patient takes aspirin once a day. Her appetite is good. Denies any unusual fatigue. No blood in stools black stools.   She has intermittent nausea; this is chronic. Not any worse. No abdominal pain. No cough or shortness of breath or chest pain.  ROS: A complete 10 point review of system is done which is negative except mentioned above in history of present illness  MEDICAL HISTORY:  Past Medical History  Diagnosis Date  . Thyroid disease   . Hypertension   . Hyperlipidemia     history of   . Cataract   . Headache   . Radiculopathy   . GERD (gastroesophageal reflux disease)   . Renal cyst   . Cancer ([place])     SKIN  . Hypothyroidism   . Dermatitis   . Rosacea   . Prolapse of uterus   . MRI contraindicated due to metal implant   . Diverticulosis 05/28/2015    SURGICAL HISTORY: Past Surgical History  Procedure Laterality Date  . Tonsillectomy    . Appendectomy    . Cesarean section    . Joint replacement Right     total hip  . Thyroidectomy      partial  . Colon surgery    . 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 No alcohol no smoking currently. Social History   Social History  . Marital Status: Married    Spouse Name: N/A  . Number of Children: N/A  . Years of Education: N/A   Occupational History  . Not on file.   Social History Main Topics  . Smoking status: Former Research scientist (life sciences)  . Smokeless tobacco: Not on file  . Alcohol Use: No  . Drug Use: No  . Sexual Activity: No   Other Topics Concern  . Not on file   Social History Narrative    FAMILY HISTORY: father; mother-stroke.  Family History  Problem Relation Age of Onset  . Family history unknown: Yes    ALLERGIES:  is allergic to novocain.  MEDICATIONS:  Current Outpatient Prescriptions  Medication Sig Dispense Refill  . amLODipine (NORVASC) 5 MG tablet TAKE ONE TABLET BY MOUTH EVERY DAY 90 tablet 3  . irbesartan (AVAPRO) 150 MG tablet Take 1 tablet (150 mg total) by mouth daily. 90 tablet 5  . ketoconazole (NIZORAL) 2 % shampoo     . levothyroxine (SYNTHROID, LEVOTHROID) 88 MCG tablet Take 1  tablet (88 mcg total) by mouth daily before breakfast. 90 tablet 2  . meloxicam (MOBIC) 15 MG tablet     . pantoprazole (PROTONIX) 20 MG tablet     . ranitidine (ZANTAC) 150 MG tablet Take 150 mg by mouth daily. AS NEEDED    . rosuvastatin (CRESTOR) 10 MG tablet Take 1 tablet (10 mg total) by mouth at bedtime. 30 tablet 1   No current facility-administered medications for this visit.      Marland Kitchen  PHYSICAL EXAMINATION: ECOG PERFORMANCE STATUS: 0 - Asymptomatic  Filed Vitals:   06/24/15 1508  BP: 165/74  Pulse: 91  Temp: 96.8 F (36 C)  Resp: 18   Filed Weights   06/24/15 1508  Weight: 142 lb 6.7 oz (64.6 kg)     GENERAL: Well-nourished well-developed; Alert, no distress and comfortable.  Alone. She looks much younger than her stated age. EYES: no pallor or icterus OROPHARYNX: no thrush or ulceration; good dentition  NECK: supple, no masses felt LYMPH:  no palpable lymphadenopathy in the cervical, axillary or inguinal regions LUNGS: clear to auscultation and  No wheeze or crackles HEART/CVS: regular rate & rhythm and no murmurs; No lower extremity edema ABDOMEN: abdomen soft, non-tender and normal bowel sounds Musculoskeletal:no cyanosis of digits and no clubbing  PSYCH: alert & oriented x 3 with fluent speech NEURO: no focal motor/sensory deficits SKIN:  no rashes or significant lesions  LABORATORY DATA:  I have reviewed the data as listed Lab Results  Component Value Date   WBC 8.2 05/29/2015   HGB 14.2 05/24/2015   HCT 43.9 05/29/2015   MCV 85 05/29/2015   PLT 644* 05/29/2015    Recent Labs  12/29/14 1524 05/24/15 0949  NA 130* 135  K 4.4 4.1  CL 101 103  CO2 23 26  GLUCOSE 131* 121*  BUN 25* 27*  CREATININE 1.26* 1.12*  CALCIUM 9.6 9.5  GFRNONAA 37* 43*  GFRAA 43* 50*  PROT 7.8  --   ALBUMIN 4.7  --   AST 22  --   ALT 15  --   ALKPHOS 64  --   BILITOT 1.3*  --     ASSESSMENT & PLAN:   # THROMBOCYTOSIS- Platelets in the range 550 to 650- the last 6 months or so. Normal white count and hemoglobin. Question essential thrombocytosis versus reactive [clinically less likely]. Patient is asymptomatic.  For now I  recommend checking CBC; jack 2 BCR ABL; MPL; CALR mutation on the peripheral blood. Also discussed regarding bone marrow biopsy with the above workup is inconclusive; however I would prefer not to do a bone marrow unless absolutely needed.  # I discussed the potential concerns for stroke with elevated platelets. Patient is on aspirin.   # Patient follow-up with me in approximately 2-3 weeks to review the above results.   Thank you Dr. Sanda Klein for allowing me to  participate in the care of your very delightful patient. Please do not hesitate to contact me with questions or concerns in the interim.     Cammie Sickle, MD 06/24/2015 3:35 PM

## 2015-06-24 NOTE — Progress Notes (Signed)
Patient here today as new evaluation regarding thrombocytosis.  Referred by Dr. Sanda Klein.

## 2015-06-25 ENCOUNTER — Encounter: Payer: Self-pay | Admitting: Family Medicine

## 2015-06-25 ENCOUNTER — Ambulatory Visit
Admission: RE | Admit: 2015-06-25 | Discharge: 2015-06-25 | Disposition: A | Discharge status: Home / Self Care | Payer: Medicare HMO | Source: Ambulatory Visit | Attending: Family Medicine | Admitting: Family Medicine

## 2015-06-25 DIAGNOSIS — D473 Essential (hemorrhagic) thrombocythemia: Secondary | ICD-10-CM | POA: Insufficient documentation

## 2015-06-25 DIAGNOSIS — E785 Hyperlipidemia, unspecified: Secondary | ICD-10-CM | POA: Insufficient documentation

## 2015-06-25 DIAGNOSIS — R42 Dizziness and giddiness: Secondary | ICD-10-CM | POA: Diagnosis not present

## 2015-06-25 DIAGNOSIS — I6529 Occlusion and stenosis of unspecified carotid artery: Secondary | ICD-10-CM | POA: Insufficient documentation

## 2015-06-25 HISTORY — DX: Occlusion and stenosis of unspecified carotid artery: I65.29

## 2015-06-27 DIAGNOSIS — R42 Dizziness and giddiness: Secondary | ICD-10-CM | POA: Diagnosis not present

## 2015-07-01 LAB — MISC LABCORP TEST (SEND OUT): Labcorp test code: 489450

## 2015-07-02 DIAGNOSIS — M5431 Sciatica, right side: Secondary | ICD-10-CM | POA: Diagnosis not present

## 2015-07-02 DIAGNOSIS — M545 Low back pain: Secondary | ICD-10-CM | POA: Diagnosis not present

## 2015-07-03 LAB — BCR-ABL1 FISH
CELLS ANALYZED: 200
CELLS COUNTED: 200

## 2015-07-03 LAB — MPL MUTATION ANALYSIS

## 2015-07-03 LAB — JAK2 GENOTYPR

## 2015-07-08 ENCOUNTER — Inpatient Hospital Stay (HOSPITAL_BASED_OUTPATIENT_CLINIC_OR_DEPARTMENT_OTHER): Payer: Medicare HMO | Admitting: Internal Medicine

## 2015-07-08 VITALS — BP 152/86 | HR 96 | Temp 98.1°F | Resp 18 | Wt 143.3 lb

## 2015-07-08 DIAGNOSIS — D473 Essential (hemorrhagic) thrombocythemia: Secondary | ICD-10-CM | POA: Diagnosis not present

## 2015-07-08 DIAGNOSIS — Z85828 Personal history of other malignant neoplasm of skin: Secondary | ICD-10-CM

## 2015-07-08 DIAGNOSIS — D75839 Thrombocytosis, unspecified: Secondary | ICD-10-CM

## 2015-07-08 DIAGNOSIS — E039 Hypothyroidism, unspecified: Secondary | ICD-10-CM

## 2015-07-08 DIAGNOSIS — E785 Hyperlipidemia, unspecified: Secondary | ICD-10-CM | POA: Diagnosis not present

## 2015-07-08 DIAGNOSIS — N183 Chronic kidney disease, stage 3 (moderate): Secondary | ICD-10-CM

## 2015-07-08 DIAGNOSIS — I129 Hypertensive chronic kidney disease with stage 1 through stage 4 chronic kidney disease, or unspecified chronic kidney disease: Secondary | ICD-10-CM | POA: Diagnosis not present

## 2015-07-08 DIAGNOSIS — R51 Headache: Secondary | ICD-10-CM

## 2015-07-08 DIAGNOSIS — L719 Rosacea, unspecified: Secondary | ICD-10-CM

## 2015-07-08 DIAGNOSIS — R110 Nausea: Secondary | ICD-10-CM

## 2015-07-08 DIAGNOSIS — M5410 Radiculopathy, site unspecified: Secondary | ICD-10-CM

## 2015-07-08 DIAGNOSIS — K219 Gastro-esophageal reflux disease without esophagitis: Secondary | ICD-10-CM

## 2015-07-08 DIAGNOSIS — I10 Essential (primary) hypertension: Secondary | ICD-10-CM

## 2015-07-08 DIAGNOSIS — Z79899 Other long term (current) drug therapy: Secondary | ICD-10-CM

## 2015-07-08 DIAGNOSIS — N281 Cyst of kidney, acquired: Secondary | ICD-10-CM

## 2015-07-08 DIAGNOSIS — K5790 Diverticulosis of intestine, part unspecified, without perforation or abscess without bleeding: Secondary | ICD-10-CM

## 2015-07-08 MED ORDER — HYDROXYUREA 500 MG PO CAPS: ORAL_CAPSULE | ORAL | Status: DC

## 2015-07-08 MED ORDER — HYDROXYUREA 500 MG PO CAPS: 500.0000 mg | ORAL_CAPSULE | Freq: Every day | ORAL | Status: DC

## 2015-07-08 NOTE — Progress Notes (Signed)
[name] NOTE  Patient Care Team: Arnetha Courser, MD as PCP - General (Family Medicine) Murlean Iba, MD (Internal Medicine) Gae Dry, MD as Referring Physician (Obstetrics and Gynecology) Estill Cotta, MD (Ophthalmology) Josefine Class, MD as Referring Physician (Gastroenterology) Anabel Bene, MD as Referring Physician (Neurology) Beverly Gust, MD (Unknown Physician Specialty)  CHIEF COMPLAINTS/PURPOSE OF CONSULTATION:   # DEC 2016-ESSENTIAL THROMBOCYTOSIS- [id number] JAK-2 POSITIVE; START Hydrea 500mg  once a day  # CKD stage III  HISTORY OF PRESENTING ILLNESS:  Gina [name] 82 y.o.  female very pleasant female patient who looks much younger than her stated age is here to review the labs which were ordered further workup of her thrombocytosis.  Patient continues to deny any significant tingling and numbness and burning pain in her hand and feet. Otherwise denies any new symptoms.   No abdominal pain. No cough or shortness of breath or chest pain.  ROS: A complete 10 point review of system is done which is negative except mentioned above in history of present illness  MEDICAL HISTORY:  Past Medical History  Diagnosis Date  . Thyroid disease   . Hypertension   . Hyperlipidemia     history of   . Cataract   . Headache   . Radiculopathy   . GERD (gastroesophageal reflux disease)   . Renal cyst   . Cancer ([place])     SKIN  . Hypothyroidism   . Dermatitis   . Rosacea   . Prolapse of uterus   . MRI contraindicated due to metal implant   . Diverticulosis 05/28/2015  . Thrombocytosis ([place])   . Mild atherosclerosis of carotid artery 06/25/2015    SURGICAL HISTORY: Past Surgical History  Procedure Laterality Date  . Tonsillectomy    . Appendectomy    . Cesarean section    . Joint replacement Right     total hip  . Thyroidectomy      partial  . Colon surgery    . 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 Social History   Social History  . Marital Status: Married    Spouse Name: N/A  . Number of Children: N/A  . Years of Education: N/A   Occupational History  . Not on file.   Social History Main Topics  . Smoking status: Former Research scientist (life sciences)  . Smokeless tobacco: Not on file  . Alcohol Use: No  . Drug Use: No  . Sexual Activity: No   Other Topics Concern  . Not on file   Social History Narrative    FAMILY HISTORY: father; mother-stroke.  Family History  Problem Relation Age of Onset  . Family history unknown: Yes    ALLERGIES:  is allergic to novocain.  MEDICATIONS:  Current Outpatient Prescriptions  Medication Sig Dispense Refill  . amLODipine (NORVASC) 5 MG tablet TAKE ONE TABLET BY MOUTH EVERY DAY 90 tablet 3  . irbesartan (AVAPRO) 150 MG tablet Take 1 tablet (150 mg total) by mouth daily. 90 tablet 5  . ketoconazole (NIZORAL) 2 % shampoo     . levothyroxine (SYNTHROID, LEVOTHROID) 88 MCG tablet Take 1 tablet (88 mcg total) by mouth daily before  breakfast. 90 tablet 2  . meloxicam (MOBIC) 15 MG tablet     . pantoprazole (PROTONIX) 20 MG tablet     . ranitidine (ZANTAC) 150 MG tablet Take 150 mg by mouth daily. AS NEEDED    . rosuvastatin (CRESTOR) 10 MG tablet Take 1 tablet (10 mg total) by mouth at bedtime. 30 tablet 1   No current facility-administered medications for this visit.      Marland Kitchen  PHYSICAL EXAMINATION: ECOG PERFORMANCE STATUS: 0 - Asymptomatic  Filed Vitals:   07/08/15 1439  BP: 152/86  Pulse: 96  Temp: 98.1 F (36.7 C)  Resp: 18   Filed Weights   07/08/15 1439  Weight: 143 lb 4.8 oz (65 kg)    GENERAL: Well-nourished  well-developed; Alert, no distress and comfortable.  Alone. She looks much younger than her stated age. EYES: no pallor or icterus OROPHARYNX: no thrush or ulceration; good dentition  NECK: supple, no masses felt LYMPH:  no palpable lymphadenopathy in the cervical, axillary or inguinal regions LUNGS: clear to auscultation and  No wheeze or crackles HEART/CVS: regular rate & rhythm and no murmurs; No lower extremity edema ABDOMEN: abdomen soft, non-tender and normal bowel sounds Musculoskeletal:no cyanosis of digits and no clubbing  PSYCH: alert & oriented x 3 with fluent speech NEURO: no focal motor/sensory deficits SKIN:  no rashes or significant lesions  LABORATORY DATA:  I have reviewed the data as listed Lab Results  Component Value Date   WBC 10.7 06/24/2015   HGB 14.5 06/24/2015   HCT 42.6 06/24/2015   MCV 83.9 06/24/2015   PLT 673* 06/24/2015    Recent Labs  12/29/14 1524 05/24/15 0949  NA 130* 135  K 4.4 4.1  CL 101 103  CO2 23 26  GLUCOSE 131* 121*  BUN 25* 27*  CREATININE 1.26* 1.12*  CALCIUM 9.6 9.5  GFRNONAA 37* 43*  GFRAA 43* 50*  PROT 7.8  --   ALBUMIN 4.7  --   AST 22  --   ALT 15  --   ALKPHOS 64  --   BILITOT 1.3*  --     ASSESSMENT & PLAN:   # ESSENTIAL THROMBOCYTOSIS- JAK-2 POSITIVE; Platelets in the range 550 to 650- the last 6 months or so. Discussed with the patient the importance of diagnosis; however patient live their lives normally without any significant mortality from the diagnosis. To cut down the risk of stroke I would recommend Hydrea. Patient is on aspirin 81 mg a day.  Recommend starting Hydrea 500 mg Monday Wednesday Friday [chronic kidney disease creatinine around 1.2]. Discussed the potential side effects including but not limited to nausea vomiting diarrhea source in the mouth skin rash elevated LFTs. However these tend to happen at higher doses.  # Patient will follow-up with me in approximately 2 months with labs. She'll call  us in between if any issues.     Cammie Sickle, MD 07/08/2015 3:06 PM

## 2015-07-10 ENCOUNTER — Ambulatory Visit: Payer: Medicare HMO | Admitting: Internal Medicine

## 2015-07-10 ENCOUNTER — Telehealth: Payer: Self-pay | Admitting: *Deleted

## 2015-07-10 DIAGNOSIS — R69 Illness, unspecified: Secondary | ICD-10-CM | POA: Diagnosis not present

## 2015-07-10 NOTE — Telephone Encounter (Signed)
Has concerns over the new med she just received. It says she has to wear gloves to handle it and to wash her hands afterwards. Would like to discuss with someone. Please call her

## 2015-07-10 NOTE — Telephone Encounter (Signed)
RN left vm msg- for patient.  I explained the rationale for using gloves when handling the hydrea. I asked her to wash her hands after touching the drug and avoid touching her eyes/mucus membranes  I asked her to call our office back should she have any additional questions or concerns.

## 2015-07-10 NOTE — Telephone Encounter (Addendum)
Message given to Vickki Muff, RN, BSN

## 2015-07-16 DIAGNOSIS — N812 Incomplete uterovaginal prolapse: Secondary | ICD-10-CM | POA: Diagnosis not present

## 2015-07-16 DIAGNOSIS — N8111 Cystocele, midline: Secondary | ICD-10-CM | POA: Diagnosis not present

## 2015-07-23 ENCOUNTER — Encounter: Payer: Self-pay | Admitting: Family Medicine

## 2015-07-23 ENCOUNTER — Ambulatory Visit (INDEPENDENT_AMBULATORY_CARE_PROVIDER_SITE_OTHER): Payer: Medicare HMO | Admitting: Family Medicine

## 2015-07-23 VITALS — BP 138/78 | HR 97 | Temp 98.2°F | Resp 18 | Ht 64.0 in | Wt 141.3 lb

## 2015-07-23 DIAGNOSIS — Z5181 Encounter for therapeutic drug level monitoring: Secondary | ICD-10-CM

## 2015-07-23 DIAGNOSIS — N814 Uterovaginal prolapse, unspecified: Secondary | ICD-10-CM | POA: Diagnosis not present

## 2015-07-23 DIAGNOSIS — E785 Hyperlipidemia, unspecified: Secondary | ICD-10-CM | POA: Diagnosis not present

## 2015-07-23 DIAGNOSIS — K219 Gastro-esophageal reflux disease without esophagitis: Secondary | ICD-10-CM

## 2015-07-23 DIAGNOSIS — D473 Essential (hemorrhagic) thrombocythemia: Secondary | ICD-10-CM | POA: Diagnosis not present

## 2015-07-23 DIAGNOSIS — R252 Cramp and spasm: Secondary | ICD-10-CM | POA: Diagnosis not present

## 2015-07-23 DIAGNOSIS — I6523 Occlusion and stenosis of bilateral carotid arteries: Secondary | ICD-10-CM | POA: Diagnosis not present

## 2015-07-23 DIAGNOSIS — E039 Hypothyroidism, unspecified: Secondary | ICD-10-CM | POA: Diagnosis not present

## 2015-07-23 DIAGNOSIS — D75839 Thrombocytosis, unspecified: Secondary | ICD-10-CM

## 2015-07-23 HISTORY — DX: Essential (hemorrhagic) thrombocythemia: D47.3

## 2015-07-23 MED ORDER — PANTOPRAZOLE SODIUM 20 MG PO TBEC: 20.0000 mg | DELAYED_RELEASE_TABLET | Freq: Every day | ORAL | Status: DC | PRN

## 2015-07-23 MED ORDER — ATORVASTATIN CALCIUM 20 MG PO TABS: 20.0000 mg | ORAL_TABLET | Freq: Every day | ORAL | Status: DC

## 2015-07-23 MED ORDER — LEVOTHYROXINE SODIUM 88 MCG PO TABS: 88.0000 ug | ORAL_TABLET | Freq: Every day | ORAL | Status: DC

## 2015-07-23 NOTE — Patient Instructions (Addendum)
I've put in a referral for you to see a hematologist at [facility] If you have not heard anything from my staff in a week about any orders/referrals/studies from today, please contact us here to follow-up [phone] Finish out the Crestor and then start the atorvastatin Have fasting labs checked 6 to 8 weeks after going back on the cholesterol medicine Try to limit saturated fats in your diet (bologna, hot dogs, barbeque, cheeseburgers, hamburgers, steak, bacon, sausage, cheese, etc.) and get more fresh fruits, vegetables, and whole grains Try horse chestnut and/or tonic water Avoid NSAIDs Avoid potassium-rich foods Avoid salt substitutes

## 2015-07-23 NOTE — Assessment & Plan Note (Addendum)
Check lipids in 6 weeks and an SGPT; see AVS; adjust statin

## 2015-07-23 NOTE — Progress Notes (Signed)
BP 138/78 mmHg  Pulse 97  Temp(Src) 98.2 F (36.8 C)  Resp 18  Ht 5\' 4"  (1.626 m)  Wt 141 lb 5 oz (64.099 kg)  BMI 24.24 kg/m2  SpO2 98%   Subjective:    Patient ID: Gina [name], female    DOB: 1927-02-04, 82 y.o.   MRN: [id number]  HPI: Gina [name] is a 82 y.o. female  Chief Complaint  Patient presents with  . referral follow up for high platlet count    pt would like a second opinion  . Medication Refill   Patient is here for a few things She no longer sees Dr. Rayann Heman, but needs a referral to GI doctor, Dr. Vira Agar  Gynecologist recommended Dr. Vira Agar She saw Dr. Kenton Kingfisher, got pessary cleaned, everything is fine there Had her carotids scanned She had xrays done again by ortho on knees and hip Also saw Dr. Janeth Rase, essential thrombocytosis; he prescribed hydroxyurea; she read about the side effects; she is not comfortable with the drug; she is worried about this drug and what she has read; legs feel like they are in cold storages Her legs bother her and she thought it was the crestor; stopped it maybe a month ago; will go back on, no jaundice and no abd pain; she was on lipitor, but can't remember if caused her problems  Depression screen [facility] 2/9 12/19/2014 10/05/2014  Decreased Interest 0 0  Down, Depressed, Hopeless 0 0  PHQ - 2 Score 0 0   Relevant past medical, surgical, family and social history reviewed Past Medical History  Diagnosis Date  . Thyroid disease   . Hypertension   . Hyperlipidemia     history of   . Cataract   . Headache   . Radiculopathy   . GERD (gastroesophageal reflux disease)   . Renal cyst   . Cancer ([place])     SKIN  . Hypothyroidism   . Dermatitis   . Rosacea   . Prolapse of uterus   . MRI contraindicated due to metal implant   . Diverticulosis 05/28/2015  . Thrombocytosis ([place])   . Mild atherosclerosis of carotid artery 06/25/2015  . Essential thrombocytosis ([place]) 07/23/2015   Past Surgical History  Procedure Laterality Date  .  Tonsillectomy    . Appendectomy    . Cesarean section    . Joint replacement Right     total hip  . Thyroidectomy      partial  . Colon surgery    . Cataract extraction w/phaco Left 03/25/2015    Procedure: CATARACT EXTRACTION PHACO AND INTRAOCULAR LENS PLACEMENT (IOC);  Surgeon: Estill Cotta, MD;  Location: [facility] ORS;  Service: Ophthalmology;  Laterality: Left;  [place] 02:09 AP% 27.3 CDE 59.97 fluid pack lot [id number]   Social History  Substance Use Topics  . Smoking status: Former Research scientist (life sciences)  . Smokeless tobacco: None  . Alcohol Use: No   Interim medical history since last visit reviewed. Allergies and medications reviewed  Review of Systems Per HPI unless specifically indicated above     Objective:    BP 138/78 mmHg  Pulse 97  Temp(Src) 98.2 F (36.8 C)  Resp 18  Ht 5\' 4"  (1.626 m)  Wt 141 lb 5 oz (64.099 kg)  BMI 24.24 kg/m2  SpO2 98%  Wt Readings from Last 3 Encounters:  07/23/15 141 lb 5 oz (64.099 kg)  07/08/15 143 lb 4.8 oz (65 kg)  06/24/15 142 lb 6.7 oz (64.6 kg)    Physical Exam  Constitutional: She appears well-developed and well-nourished. No distress.  Cardiovascular: Normal rate and regular rhythm.   Pulmonary/Chest: Effort normal and breath sounds normal.  Abdominal: Soft. She exhibits no distension.  Musculoskeletal: She exhibits no edema.  Neurological: She is alert.  Bright, energetic for age  Psychiatric: She has a normal mood and affect. Her mood appears not anxious. Cognition and memory are not impaired. She does not exhibit a depressed mood. She exhibits normal recent memory and normal remote memory.   Results for orders placed or performed in visit on 06/24/15  Lactate dehydrogenase  Result Value Ref Range   LDH 159 98 - 192 U/L  CBC with Differential/Platelet  Result Value Ref Range   WBC 10.7 3.6 - 11.0 K/uL   RBC 5.07 3.80 - 5.20 MIL/uL   Hemoglobin 14.5 12.0 - 16.0 g/dL   HCT 42.6 35.0 - 47.0 %   MCV 83.9 80.0 - 100.0 fL   MCH  28.6 26.0 - 34.0 pg   MCHC 34.1 32.0 - 36.0 g/dL   RDW 15.1 (H) 11.5 - 14.5 %   Platelets 673 (H) 150 - 440 K/uL   Neutrophils Relative % 83 %   Neutro Abs 8.9 (H) 1.4 - 6.5 K/uL   Lymphocytes Relative 9 %   Lymphs Abs 0.9 (L) 1.0 - 3.6 K/uL   Monocytes Relative 6 %   Monocytes Absolute 0.6 0.2 - 0.9 K/uL   Eosinophils Relative 1 %   Eosinophils Absolute 0.1 0 - 0.7 K/uL   Basophils Relative 1 %   Basophils Absolute 0.1 0 - 0.1 K/uL  JAK2 genotypr  Result Value Ref Range   JAK2 GenotypR Comment (A)    Director Review, JAK2 Comment    BACKGROUND: Comment   MPL mutation analysis  Result Value Ref Range   MPL Mutation Analysis Result: Comment    BACKGROUND: Comment    METHODOLOGY: Comment    REFERENCES: Comment    DIRECTOR REVIEW: Comment   BCR-ABL1 FISH  Result Value Ref Range   Specimen Type BLOOD    Cells Counted 200    Cells Analyzed 200    FISH Result Comment:    Interpretation Comment:    Director Review: Comment:   Miscellaneous [facility] test (send-out)  Result Value Ref Range   [facility] test code [phone]    [facility] test name calreticulin    Misc [facility] result COMMENT       Assessment & Plan:   Problem List Items Addressed This Visit      Cardiovascular and Mediastinum   Mild atherosclerosis of carotid artery    Reviewed [place]; less than 50% blockage bilaterally; continue to take statin (adjusting, see that section), and we'll rescan in one year      Relevant Medications   atorvastatin (LIPITOR) 20 MG tablet     Digestive   GERD without esophagitis    Patient requested rferral to  Dr. Vira Agar      Relevant Medications   pantoprazole (PROTONIX) 20 MG tablet   Other Relevant Orders   Ambulatory referral to Gastroenterology     Genitourinary   Uterine prolapse    With pessary use; recently and regularly folllowed by GYN        Hematopoietic and Hemostatic   Essential thrombocytosis ([place])   Relevant Orders   Ambulatory referral to Hematology    Thrombocytosis [facility])    Patient would like to see 2nd hematologist; referral entered at her request      Relevant Medications  pantoprazole (PROTONIX) 20 MG tablet   Other Relevant Orders   Ambulatory referral to Hematology     Other   Cramps of lower extremity    See AVS; try horse chestnut      Dyslipidemia - Primary    Check lipids in 6 weeks and an SGPT; see AVS; adjust statin      Relevant Medications   atorvastatin (LIPITOR) 20 MG tablet   Other Relevant Orders   Lipid Panel w/o Chol/HDL Ratio   Medication monitoring encounter    Check SGPT in 6 weeks on crestor      Relevant Orders   ALT    Other Visit Diagnoses    Hypothyroidism, acquired        Relevant Medications    levothyroxine (SYNTHROID, LEVOTHROID) 88 MCG tablet       Follow up plan: Return in about 6 months (around 01/23/2016) for cholesterol, but I am here for you sooner if needed.  An after-visit summary was printed and given to the patient at [facility].  Please see the patient instructions which may contain other information and recommendations beyond what is mentioned above in the assessment and plan.  Meds ordered this encounter  Medications  . atorvastatin (LIPITOR) 20 MG tablet    Sig: Take 1 tablet (20 mg total) by mouth daily.    Dispense:  90 tablet    Refill:  1    Call me if too $$$  . pantoprazole (PROTONIX) 20 MG tablet    Sig: Take 1 tablet (20 mg total) by mouth daily as needed. Caution:prolonged use may increase risk of pneumonia, colitis, osteoporosis, anemia    Dispense:  90 tablet    Refill:  1  . levothyroxine (SYNTHROID, LEVOTHROID) 88 MCG tablet    Sig: Take 1 tablet (88 mcg total) by mouth daily before breakfast.    Dispense:  90 tablet    Refill:  3   Orders Placed This Encounter  Procedures  . ALT  . Lipid Panel w/o Chol/HDL Ratio  . Ambulatory referral to Hematology  . Ambulatory referral to Gastroenterology

## 2015-07-23 NOTE — Assessment & Plan Note (Signed)
Check SGPT in 6 weeks on crestor

## 2015-07-29 ENCOUNTER — Telehealth: Payer: Self-pay | Admitting: Family Medicine

## 2015-07-29 NOTE — Telephone Encounter (Signed)
Thank you Roselyn Reef, can you please check on this and get back to patient? Thank you

## 2015-07-29 NOTE — Telephone Encounter (Signed)
Was told to give you a call if she had not heard anything from [facility] (hemotogist) due to her referral. Tomorrow will be a week.

## 2015-07-30 NOTE — Telephone Encounter (Signed)
Just faxed back to right specialty yesterday pt notified

## 2015-08-06 DIAGNOSIS — H43813 Vitreous degeneration, bilateral: Secondary | ICD-10-CM | POA: Diagnosis not present

## 2015-08-07 DIAGNOSIS — Z85828 Personal history of other malignant neoplasm of skin: Secondary | ICD-10-CM | POA: Diagnosis not present

## 2015-08-07 DIAGNOSIS — L219 Seborrheic dermatitis, unspecified: Secondary | ICD-10-CM | POA: Diagnosis not present

## 2015-08-07 DIAGNOSIS — Z8619 Personal history of other infectious and parasitic diseases: Secondary | ICD-10-CM | POA: Diagnosis not present

## 2015-08-18 NOTE — Assessment & Plan Note (Signed)
Reviewed [place]; less than 50% blockage bilaterally; continue to take statin (adjusting, see that section), and we'll rescan in one year

## 2015-08-18 NOTE — Assessment & Plan Note (Signed)
Patient would like to see 2nd hematologist; referral entered at her request

## 2015-08-18 NOTE — Assessment & Plan Note (Signed)
With pessary use; recently and regularly folllowed by GYN

## 2015-08-18 NOTE — Assessment & Plan Note (Signed)
See AVS; try horse chestnut

## 2015-08-18 NOTE — Assessment & Plan Note (Signed)
Patient requested rferral to  Dr. Vira Agar

## 2015-08-19 ENCOUNTER — Encounter: Payer: Self-pay | Admitting: Family Medicine

## 2015-08-19 ENCOUNTER — Ambulatory Visit (INDEPENDENT_AMBULATORY_CARE_PROVIDER_SITE_OTHER): Payer: Medicare HMO | Admitting: Family Medicine

## 2015-08-19 ENCOUNTER — Telehealth: Payer: Self-pay | Admitting: Family Medicine

## 2015-08-19 VITALS — BP 122/60 | HR 95 | Temp 98.1°F | Resp 16 | Wt 145.0 lb

## 2015-08-19 DIAGNOSIS — D473 Essential (hemorrhagic) thrombocythemia: Secondary | ICD-10-CM

## 2015-08-19 DIAGNOSIS — G629 Polyneuropathy, unspecified: Secondary | ICD-10-CM | POA: Diagnosis not present

## 2015-08-19 DIAGNOSIS — Z418 Encounter for other procedures for purposes other than remedying health state: Secondary | ICD-10-CM | POA: Diagnosis not present

## 2015-08-19 DIAGNOSIS — D75839 Thrombocytosis, unspecified: Secondary | ICD-10-CM

## 2015-08-19 DIAGNOSIS — Z298 Encounter for other specified prophylactic measures: Secondary | ICD-10-CM

## 2015-08-19 NOTE — Telephone Encounter (Signed)
I left detailed msg; so sorry we cannot fit her in today; suggested urgent care at [facility], they have our same medical records, can look at her info if ER too expensive

## 2015-08-19 NOTE — Assessment & Plan Note (Addendum)
Per neurologist, acupuncture might be helfpul so will refer; note to specialist, would UPEP/SPEP be helpful in work-up?

## 2015-08-19 NOTE — Telephone Encounter (Signed)
Patient is having numbness from her knee down to her feet since her diagnosis. She is going to her second opinion but they are not able to see her until August. She is asking that you please work her in today because she is afraid. Please advise. She does not want to go to the ER due to the copay there. Also she has to go to the dentist on Thursday to get a implant and they will be numbing her gum to put an implant in, she will have to take amoxicillin as well. She is afraid that this will make her situation worse. If you are not able to work her in please give consult

## 2015-08-19 NOTE — Progress Notes (Signed)
BP 122/60   Pulse 95   Temp 98.1 F (36.7 C) (Oral)   Resp 16   Wt 145 lb (65.8 kg)   SpO2 95%   BMI 24.89 kg/m    Subjective:    Patient ID: Gina [name], female    DOB: [birth year], 82 y.o.   MRN: [id number]  HPI: Gina [name] is a 82 y.o. female  Chief Complaint  Patient presents with  . Numbness    knee down bilateral   She saw Dr. Nehemiah Massed and he said she had shingles; right inner arm; painful in the arm, he thought it was shingles; she got the vaccine about 8 years ago; weeks ago; healing fast Her legs have some numbness She read the iterature about thrombocytosis, legs are affected, not in the hands; just in the calves and feet; on a statin which she has taken before Almost like restless legs, kind of thing; not sure if cold or hot; doesn't loook unusual Not aching but more of a burning; left leg is weak Orthopaedist says knee is fine; he thought spine; walking fine; legs do not feel heavy Feet do tingle and feel hot; does not keep her from doing anything Can block it out if engrossed in something  No loss of control of B/B; no leakage  K+ was high and that med was stopped by Dr. Candiss Norse; no salt substitutes; no fruit smoothies  Going to dentist, needs a crown  Relevant past medical, surgical, family and social history reviewed Past Medical History:  Diagnosis Date  . Cancer ([place])    SKIN  . Cataract   . Dermatitis   . Diverticulosis 05/28/2015  . Essential thrombocytosis ([place]) 07/23/2015  . GERD (gastroesophageal reflux disease)   . Headache   . Hyperlipidemia    history of   . Hypertension   . Hypothyroidism   . Mild atherosclerosis of carotid artery 06/25/2015  . MRI contraindicated due to metal implant   . Prolapse of uterus   . Radiculopathy   . Renal cyst   . Rosacea   . Shingles   . Thrombocytosis ([place])   . Thyroid disease    Past Surgical History:  Procedure Laterality Date  . APPENDECTOMY    . CATARACT EXTRACTION W/PHACO Left 03/25/2015   Procedure: CATARACT EXTRACTION PHACO AND INTRAOCULAR LENS PLACEMENT (IOC);  Surgeon: Estill Cotta, MD;  Location: [facility] ORS;  Service: Ophthalmology;  Laterality: Left;  [place] 02:09 AP% 27.3 CDE 59.97 fluid pack lot [id number]  . CESAREAN SECTION    . COLON SURGERY    . JOINT REPLACEMENT Right    total hip  . THYROIDECTOMY     partial  . TONSILLECTOMY     Family History  Problem Relation Age of Onset  . Family history unknown: Yes   Social History  Substance Use Topics  . Smoking status: Former Research scientist (life sciences)  . Smokeless tobacco: Not on file  . Alcohol use No    Interim medical history since last visit reviewed. Allergies and medications reviewed  Review of Systems Per HPI unless specifically indicated above     Objective:    BP 122/60   Pulse 95   Temp 98.1 F (36.7 C) (Oral)   Resp 16   Wt 145 lb (65.8 kg)   SpO2 95%   BMI 24.89 kg/m   Wt Readings from Last 3 Encounters:  08/19/15 145 lb (65.8 kg)  07/23/15 141 lb 5 oz (64.1 kg)  07/08/15 143 lb 4.8 oz (65 kg)  Physical Exam  Constitutional: She appears well-developed and well-nourished. No distress.  Cardiovascular: Normal rate and regular rhythm.   Pulmonary/Chest: Effort normal and breath sounds normal.  Abdominal: Soft. She exhibits no distension.  Musculoskeletal: She exhibits no edema.  Neurological: She is alert. She displays no atrophy. No sensory deficit. She exhibits normal muscle tone. Gait normal.  Bright, energetic for age; sensation intact in the legs/feet for cold, pinprick, vibration, proprioception intact; normal 2+ reflexes in the lower extremities  Skin: No bruising and no ecchymosis noted. No pallor.  Psychiatric: She has a normal mood and affect. Her mood appears not anxious. Cognition and memory are not impaired. She does not exhibit a depressed mood. She exhibits normal recent memory and normal remote memory.    Results for orders placed or performed in visit on 07/23/15  ALT  Result  Value Ref Range   ALT 18 0 - 32 IU/L  Lipid Panel w/o Chol/HDL Ratio  Result Value Ref Range   Cholesterol, Total 170 100 - 199 mg/dL   Triglycerides 173 (H) 0 - 149 mg/dL   HDL 55 >39 mg/dL   VLDL Cholesterol Cal 35 5 - 40 mg/dL   LDL Calculated 80 0 - 99 mg/dL  Specimen status report  Result Value Ref Range   specimen status report Comment       Assessment & Plan:   Problem List Items Addressed This Visit      Nervous and Auditory   Neuropathy ([place]) - Primary    Per neurologist, acupuncture might be helfpul so will refer; note to specialist, would UPEP/SPEP be helpful in work-up?      Relevant Orders   Ambulatory referral for Acupuncture     Hematopoietic and Hemostatic   Thrombocytosis ([place])    I'm not aware of any relationship between platelet condition and symptoms in the legs; she will check with specialist        Other   Need for SBE (subacute bacterial endocarditis) prophylaxis    2,000 mg of amoxicillin one hour prior to dental procedures       Other Visit Diagnoses   None.     Follow up plan: No Follow-up on file.  An after-visit summary was printed and given to the patient at [place].  Please see the patient instructions which may contain other information and recommendations beyond what is mentioned above in the assessment and plan.  Meds ordered this encounter  Medications  . amoxicillin (AMOXIL) 500 MG capsule    Sig: Four pills by mouth one hour prior to procedure    Dispense:  30 capsule    Refill:  0  . aspirin EC 81 MG tablet    Sig: Take 1 tablet (81 mg total) by mouth daily.    Orders Placed This Encounter  Procedures  . Ambulatory referral for Acupuncture   Dr. Zigmund Gottron [facility] Dr. Melrose Nakayama -- intact sensation cold, pinprink, vibration, propioception; normal reflexes; you saw her for peripheral neuro; is it worthwhile to check UPEP SPEP or could platelets cause her symptosm

## 2015-08-20 ENCOUNTER — Telehealth: Payer: Self-pay

## 2015-08-20 NOTE — Telephone Encounter (Signed)
You placed referral for patient to see [facility] dr. Tiffany Kocher for Jerrye Bushy we have setup appt but she is stating she does not need to see them?  You just entered it?

## 2015-08-20 NOTE — Telephone Encounter (Signed)
That's what she told me at her May 30th appointment; see HPI in the office note; she specifically said that the gynecologist suggested Dr. Vira Agar by name Please call her; if she doesn't want to go see him, that's entirely up to her

## 2015-08-21 ENCOUNTER — Telehealth: Payer: Self-pay | Admitting: Family Medicine

## 2015-08-21 NOTE — Telephone Encounter (Signed)
Pot notified

## 2015-08-21 NOTE — Telephone Encounter (Signed)
Could you please ask Gina [name] to get that from Dr. Candiss Norse from now on? He'll be monitoring Gina [name] kidneys and will know when this needs to be adjusted or tested

## 2015-08-21 NOTE — Telephone Encounter (Signed)
Patient is asking for a 90 day supply for Irbesartan. Please send to [facility]

## 2015-08-23 DIAGNOSIS — M9901 Segmental and somatic dysfunction of cervical region: Secondary | ICD-10-CM | POA: Diagnosis not present

## 2015-08-23 DIAGNOSIS — M955 Acquired deformity of pelvis: Secondary | ICD-10-CM | POA: Diagnosis not present

## 2015-08-23 DIAGNOSIS — M9903 Segmental and somatic dysfunction of lumbar region: Secondary | ICD-10-CM | POA: Diagnosis not present

## 2015-08-23 DIAGNOSIS — M5416 Radiculopathy, lumbar region: Secondary | ICD-10-CM | POA: Diagnosis not present

## 2015-08-23 DIAGNOSIS — M5033 Other cervical disc degeneration, cervicothoracic region: Secondary | ICD-10-CM | POA: Diagnosis not present

## 2015-08-23 DIAGNOSIS — M9905 Segmental and somatic dysfunction of pelvic region: Secondary | ICD-10-CM | POA: Diagnosis not present

## 2015-08-28 DIAGNOSIS — M9905 Segmental and somatic dysfunction of pelvic region: Secondary | ICD-10-CM | POA: Diagnosis not present

## 2015-08-28 DIAGNOSIS — M9901 Segmental and somatic dysfunction of cervical region: Secondary | ICD-10-CM | POA: Diagnosis not present

## 2015-08-28 DIAGNOSIS — M9903 Segmental and somatic dysfunction of lumbar region: Secondary | ICD-10-CM | POA: Diagnosis not present

## 2015-08-28 DIAGNOSIS — M955 Acquired deformity of pelvis: Secondary | ICD-10-CM | POA: Diagnosis not present

## 2015-08-28 DIAGNOSIS — M5416 Radiculopathy, lumbar region: Secondary | ICD-10-CM | POA: Diagnosis not present

## 2015-08-28 DIAGNOSIS — M5033 Other cervical disc degeneration, cervicothoracic region: Secondary | ICD-10-CM | POA: Diagnosis not present

## 2015-09-02 DIAGNOSIS — E785 Hyperlipidemia, unspecified: Secondary | ICD-10-CM | POA: Diagnosis not present

## 2015-09-02 DIAGNOSIS — Z5181 Encounter for therapeutic drug level monitoring: Secondary | ICD-10-CM | POA: Diagnosis not present

## 2015-09-03 LAB — ALT: ALT: 18 IU/L (ref 0–32)

## 2015-09-04 ENCOUNTER — Other Ambulatory Visit: Payer: Self-pay | Admitting: Family Medicine

## 2015-09-04 LAB — LIPID PANEL W/O CHOL/HDL RATIO
CHOLESTEROL TOTAL: 170 mg/dL (ref 100–199)
HDL: 55 mg/dL (ref >39)
LDL Calculated: 80 mg/dL (ref 0–99)
TRIGLYCERIDES: 173 mg/dL — AB (ref 0–149)
VLDL Cholesterol Cal: 35 mg/dL (ref 5–40)

## 2015-09-04 LAB — SPECIMEN STATUS REPORT

## 2015-09-04 MED ORDER — ATORVASTATIN CALCIUM 20 MG PO TABS: 20.0000 mg | ORAL_TABLET | Freq: Every day | ORAL | Status: DC

## 2015-09-09 ENCOUNTER — Other Ambulatory Visit: Payer: Medicare HMO

## 2015-09-09 ENCOUNTER — Ambulatory Visit: Payer: Medicare HMO | Admitting: Internal Medicine

## 2015-09-13 ENCOUNTER — Telehealth: Payer: Self-pay | Admitting: Family Medicine

## 2015-09-13 NOTE — Telephone Encounter (Signed)
Pt still having pain in right knee has an appt with ortho next week but in meantime what can she take otc? Already using ice and heat with no relief.

## 2015-09-13 NOTE — Telephone Encounter (Signed)
Pt.notified

## 2015-09-13 NOTE — Telephone Encounter (Signed)
Pt requesting a call back please

## 2015-09-13 NOTE — Telephone Encounter (Signed)
I'll suggest tylenol 500 mg, one or two by mouth every six hours, but maximum dose of 3,000 mg per day (no more than six pills total per 24 hour period) She can also try one aleve 220 mg every twelve hours; okay to take both; aleve does have low risk of heart attack, stroke, so tylenol is the safest, but she can add aleve if needed

## 2015-09-19 DIAGNOSIS — M5432 Sciatica, left side: Secondary | ICD-10-CM | POA: Diagnosis not present

## 2015-09-19 DIAGNOSIS — M5431 Sciatica, right side: Secondary | ICD-10-CM | POA: Diagnosis not present

## 2015-09-19 DIAGNOSIS — M4316 Spondylolisthesis, lumbar region: Secondary | ICD-10-CM | POA: Diagnosis not present

## 2015-09-20 DIAGNOSIS — T07 Unspecified multiple injuries: Secondary | ICD-10-CM | POA: Diagnosis not present

## 2015-09-20 DIAGNOSIS — L299 Pruritus, unspecified: Secondary | ICD-10-CM | POA: Diagnosis not present

## 2015-10-02 DIAGNOSIS — I10 Essential (primary) hypertension: Secondary | ICD-10-CM | POA: Diagnosis not present

## 2015-10-02 DIAGNOSIS — E785 Hyperlipidemia, unspecified: Secondary | ICD-10-CM | POA: Diagnosis not present

## 2015-10-02 DIAGNOSIS — E039 Hypothyroidism, unspecified: Secondary | ICD-10-CM | POA: Diagnosis not present

## 2015-10-02 DIAGNOSIS — G5793 Unspecified mononeuropathy of bilateral lower limbs: Secondary | ICD-10-CM | POA: Diagnosis not present

## 2015-10-02 DIAGNOSIS — K219 Gastro-esophageal reflux disease without esophagitis: Secondary | ICD-10-CM | POA: Diagnosis not present

## 2015-10-02 DIAGNOSIS — Z87891 Personal history of nicotine dependence: Secondary | ICD-10-CM | POA: Diagnosis not present

## 2015-10-02 DIAGNOSIS — I4891 Unspecified atrial fibrillation: Secondary | ICD-10-CM | POA: Diagnosis not present

## 2015-10-02 DIAGNOSIS — D473 Essential (hemorrhagic) thrombocythemia: Secondary | ICD-10-CM | POA: Diagnosis not present

## 2015-10-16 DIAGNOSIS — Z298 Encounter for other specified prophylactic measures: Secondary | ICD-10-CM | POA: Insufficient documentation

## 2015-10-16 NOTE — Assessment & Plan Note (Signed)
I'm not aware of any relationship between platelet condition and symptoms in the legs; she will check with specialist

## 2015-10-16 NOTE — Assessment & Plan Note (Signed)
2,000 mg of amoxicillin one hour prior to dental procedures

## 2015-10-31 DIAGNOSIS — N812 Incomplete uterovaginal prolapse: Secondary | ICD-10-CM | POA: Diagnosis not present

## 2015-10-31 DIAGNOSIS — N8111 Cystocele, midline: Secondary | ICD-10-CM | POA: Diagnosis not present

## 2015-11-06 DIAGNOSIS — R69 Illness, unspecified: Secondary | ICD-10-CM | POA: Diagnosis not present

## 2015-11-18 ENCOUNTER — Other Ambulatory Visit: Payer: Self-pay

## 2015-11-19 MED ORDER — AMLODIPINE BESYLATE 5 MG PO TABS: 5.0000 mg | ORAL_TABLET | Freq: Every day | ORAL | 3 refills | Status: DC

## 2015-11-22 DIAGNOSIS — N183 Chronic kidney disease, stage 3 (moderate): Secondary | ICD-10-CM | POA: Diagnosis not present

## 2015-11-22 DIAGNOSIS — I10 Essential (primary) hypertension: Secondary | ICD-10-CM | POA: Diagnosis not present

## 2015-11-22 DIAGNOSIS — E875 Hyperkalemia: Secondary | ICD-10-CM | POA: Diagnosis not present

## 2015-12-04 DIAGNOSIS — K219 Gastro-esophageal reflux disease without esophagitis: Secondary | ICD-10-CM | POA: Diagnosis not present

## 2015-12-04 DIAGNOSIS — D473 Essential (hemorrhagic) thrombocythemia: Secondary | ICD-10-CM | POA: Diagnosis not present

## 2015-12-04 DIAGNOSIS — E785 Hyperlipidemia, unspecified: Secondary | ICD-10-CM | POA: Diagnosis not present

## 2015-12-04 DIAGNOSIS — I7381 Erythromelalgia: Secondary | ICD-10-CM | POA: Diagnosis not present

## 2015-12-04 DIAGNOSIS — E039 Hypothyroidism, unspecified: Secondary | ICD-10-CM | POA: Diagnosis not present

## 2015-12-04 DIAGNOSIS — I10 Essential (primary) hypertension: Secondary | ICD-10-CM | POA: Diagnosis not present

## 2015-12-31 ENCOUNTER — Ambulatory Visit (INDEPENDENT_AMBULATORY_CARE_PROVIDER_SITE_OTHER): Payer: Medicare HMO | Admitting: Family Medicine

## 2015-12-31 ENCOUNTER — Encounter: Payer: Self-pay | Admitting: Family Medicine

## 2015-12-31 VITALS — BP 120/60 | HR 82 | Temp 97.7°F | Resp 14 | Ht 64.0 in | Wt 147.6 lb

## 2015-12-31 DIAGNOSIS — R42 Dizziness and giddiness: Secondary | ICD-10-CM

## 2015-12-31 DIAGNOSIS — I6523 Occlusion and stenosis of bilateral carotid arteries: Secondary | ICD-10-CM

## 2015-12-31 DIAGNOSIS — I10 Essential (primary) hypertension: Secondary | ICD-10-CM | POA: Diagnosis not present

## 2015-12-31 DIAGNOSIS — E039 Hypothyroidism, unspecified: Secondary | ICD-10-CM | POA: Diagnosis not present

## 2015-12-31 DIAGNOSIS — Z23 Encounter for immunization: Secondary | ICD-10-CM

## 2015-12-31 DIAGNOSIS — E785 Hyperlipidemia, unspecified: Secondary | ICD-10-CM | POA: Diagnosis not present

## 2015-12-31 DIAGNOSIS — D473 Essential (hemorrhagic) thrombocythemia: Secondary | ICD-10-CM | POA: Diagnosis not present

## 2015-12-31 MED ORDER — AMLODIPINE BESYLATE 2.5 MG PO TABS: 2.5000 mg | ORAL_TABLET | Freq: Every day | ORAL | 3 refills | Status: DC

## 2015-12-31 NOTE — Assessment & Plan Note (Addendum)
Excellent control; continue ARB, lower dose of the CCB; well below goal, see if helps symptoms of shaking

## 2015-12-31 NOTE — Patient Instructions (Addendum)
Decrease the amlodipine from 5 mg to 2.5 mg Monitor your blood pressure and call me if trending up above 150 on top Your goal blood pressure is less than 150 mmHg on top. Try to follow the DASH guidelines (DASH stands for Dietary Approaches to Stop Hypertension) Try to limit the sodium in your diet.  Ideally, consume less than 1.5 grams (less than 1,500mg ) per day. Do not add salt when cooking or at the table.  Check the sodium amount on labels when shopping, and choose items lower in sodium when given a choice. Avoid or limit foods that already contain a lot of sodium. Eat a diet rich in fruits and vegetables and whole grains. Try to limit saturated fats in your diet (bologna, hot dogs, barbeque, cheeseburgers, hamburgers, steak, bacon, sausage, cheese, etc.) and get more fresh fruits, vegetables, and whole grains

## 2015-12-31 NOTE — Assessment & Plan Note (Signed)
Bilateral less than 50% May 2017; now with LDL of 80 on statin; taking aspirin, two a day

## 2015-12-31 NOTE — Assessment & Plan Note (Signed)
Managed by hematologist 

## 2015-12-31 NOTE — Assessment & Plan Note (Signed)
Decrease the BP medicine; monitor; further work-up needed

## 2015-12-31 NOTE — Assessment & Plan Note (Signed)
Check TSH 

## 2015-12-31 NOTE — Assessment & Plan Note (Signed)
Reviewed the last lipid panels with patient; continue healthy eating, statin

## 2015-12-31 NOTE — Progress Notes (Signed)
BP 120/60 (BP Location: Left Arm, Patient Position: Sitting, Cuff Size: Normal)   Pulse 82   Temp 97.7 F (36.5 C) (Oral)   Resp 14   Ht 5\' 4"  (1.626 m)   Wt 147 lb 9 oz (66.9 kg)   SpO2 94%   BMI 25.33 kg/m    Subjective:    Patient ID: Gina [name], female    DOB: 1926-09-14, 82 y.o.   MRN: [id number]  HPI: Gina [name] is a 82 y.o. female  Chief Complaint  Patient presents with  . Hyperlipidemia  . Hypertension   She is here for follow-up She got a bill from [place] and will address with office manager She asked about same day appointments  She has essential thrombocytosis, on hydroxyurea; she says its the "good kind" and she'll have it for years and years It went from 740 to 497 after the first treatment; she is taking 3 times a week Energy is good; not having fatigue She had the pains down the backs of the legs, but those are gone [place] in her kitchen, slipped when mopping and slipped on the wet floor, fell on the right side, able to bear weight and no LOC She got a flu shot in Sept; asked about getting a pneumonia shot  We reviewed her cholesterol readings, the last two; total dropped from 268 to 170 Doesn't fry anything; lots of salads and veggies; does eat egg 2-3 per week  Seborrhea; uses shampoo about 2x a week  Thyroid surgery b/c of nodules; on med for life; she is having a little shaking  Her ears are plugged up; she has allergies anyway and her nose is running and her ears are blocked up  Depression screen [facility] 2/9 12/31/2015 12/19/2014 10/05/2014  Decreased Interest 0 0 0  Down, Depressed, Hopeless 0 0 0  PHQ - 2 Score 0 0 0   Relevant past medical, surgical, family and social history reviewed Past Medical History:  Diagnosis Date  . Cancer ([place])    SKIN  . Cataract   . Dermatitis   . Diverticulosis 05/28/2015  . Essential thrombocytosis ([place]) 07/23/2015  . GERD (gastroesophageal reflux disease)   . Headache   . Hyperlipidemia    history of     . Hypertension   . Hypothyroidism   . Mild atherosclerosis of carotid artery 06/25/2015  . MRI contraindicated due to metal implant   . Prolapse of uterus   . Radiculopathy   . Renal cyst   . Rosacea   . Shingles   . Thrombocytosis ([place])   . Thyroid disease    Past Surgical History:  Procedure Laterality Date  . APPENDECTOMY    . CATARACT EXTRACTION W/PHACO Left 03/25/2015   Procedure: CATARACT EXTRACTION PHACO AND INTRAOCULAR LENS PLACEMENT (IOC);  Surgeon: Estill Cotta, MD;  Location: [facility] ORS;  Service: Ophthalmology;  Laterality: Left;  [place] 02:09 AP% 27.3 CDE 59.97 fluid pack lot [id number]  . CESAREAN SECTION    . COLON SURGERY    . JOINT REPLACEMENT Right    total hip  . THYROIDECTOMY     partial  . TONSILLECTOMY     Family History  Problem Relation Age of Onset  . Family history unknown: Yes   Social History  Substance Use Topics  . Smoking status: Former Research scientist (life sciences)  . Smokeless tobacco: Not on file  . Alcohol use No   Interim medical history since last visit reviewed. Allergies and medications reviewed  Review of Systems Per HPI  unless specifically indicated above     Objective:    BP 120/60 (BP Location: Left Arm, Patient Position: Sitting, Cuff Size: Normal)   Pulse 82   Temp 97.7 F (36.5 C) (Oral)   Resp 14   Ht 5\' 4"  (1.626 m)   Wt 147 lb 9 oz (66.9 kg)   SpO2 94%   BMI 25.33 kg/m   Wt Readings from Last 3 Encounters:  12/31/15 147 lb 9 oz (66.9 kg)  08/19/15 145 lb (65.8 kg)  07/23/15 141 lb 5 oz (64.1 kg)    Physical Exam  Constitutional: She appears well-developed and well-nourished. No distress.  HENT:  Head: Normocephalic and atraumatic.  Eyes: No scleral icterus.  Neck: Carotid bruit is not present. No thyromegaly present.  Cardiovascular: Normal rate and regular rhythm.   Pulmonary/Chest: Effort normal and breath sounds normal.  Abdominal: Soft. She exhibits no distension.  Musculoskeletal: She exhibits no edema.  Neurological:  She is alert.  Bright, energetic for age  Skin:  Fingernails painted; no palmar erythema  Psychiatric: She has a normal mood and affect. Her mood appears not anxious. Cognition and memory are not impaired. She does not exhibit a depressed mood. She exhibits normal recent memory and normal remote memory.   Results for orders placed or performed in visit on 07/23/15  ALT  Result Value Ref Range   ALT 18 0 - 32 IU/L  Lipid Panel w/o Chol/HDL Ratio  Result Value Ref Range   Cholesterol, Total 170 100 - 199 mg/dL   Triglycerides 173 (H) 0 - 149 mg/dL   HDL 55 >39 mg/dL   VLDL Cholesterol Cal 35 5 - 40 mg/dL   LDL Calculated 80 0 - 99 mg/dL  Specimen status report  Result Value Ref Range   specimen status report Comment       Assessment & Plan:   Problem List Items Addressed This Visit      Cardiovascular and Mediastinum   Mild atherosclerosis of carotid artery (Chronic)    Bilateral less than 50% May 2017; now with LDL of 80 on statin; taking aspirin, two a day      Relevant Medications   amLODipine (NORVASC) 2.5 MG tablet   Hypertension goal BP (blood pressure) < 150/90 - Primary (Chronic)    Excellent control; continue ARB, lower dose of the CCB; well below goal, see if helps symptoms of shaking      Relevant Medications   amLODipine (NORVASC) 2.5 MG tablet     Endocrine   Hypothyroidism, adult    Check TSH        Hematopoietic and Hemostatic   Essential thrombocytosis ([facility]) (Chronic)    Managed by hematologist      Relevant Medications   hydroxyurea (HYDREA) 500 MG capsule     Other   Dyslipidemia    Reviewed the last lipid panels with patient; continue healthy eating, statin      Dizziness    Decrease the BP medicine; monitor; further work-up needed       Other Visit Diagnoses    Need for vaccination with 13-polyvalent pneumococcal conjugate vaccine       Relevant Orders   Pneumococcal conjugate vaccine 13-valent (Completed)      Follow up  plan: Return in about 2 months (around 03/05/2016) for fasting labs only; return to see Dr. Sanda Klein in 6 months.  An after-visit summary was printed and given to the patient at [place].  Please see the patient instructions which may contain  other information and recommendations beyond what is mentioned above in the assessment and plan.  Meds ordered this encounter  Medications  . Cholecalciferol (VITAMIN D3) 1000 units CAPS    Sig: Take by mouth daily.   . hydroxyurea (HYDREA) 500 MG capsule    Sig: Take 500 mg by mouth. Three days per week  . amLODipine (NORVASC) 2.5 MG tablet    Sig: Take 1 tablet (2.5 mg total) by mouth daily.    Dispense:  90 tablet    Refill:  3    (new lower dose)    Orders Placed This Encounter  Procedures  . Pneumococcal conjugate vaccine 13-valent

## 2016-01-02 ENCOUNTER — Telehealth: Payer: Self-pay | Admitting: Family Medicine

## 2016-01-03 ENCOUNTER — Telehealth: Payer: Self-pay

## 2016-01-03 MED ORDER — AMLODIPINE BESYLATE 5 MG PO TABS: 5.0000 mg | ORAL_TABLET | Freq: Every day | ORAL | 3 refills | Status: DC

## 2016-01-03 NOTE — Telephone Encounter (Signed)
I returned the patient's call No other changes (salt, decongestants) This has happened before, she says; same response when decreasing medicine Go back up to 5 mg; monitor BP Contact after hours triage nurse/doctor if needed over weekend Call us Monday with readings Practice relaxation, salt avoidance

## 2016-01-03 NOTE — Telephone Encounter (Signed)
So Cassandra sent me a message yesterday afternoon about this patient and there was no documentation so I did not know what it was regarding.  She called back this am stating since you changed her bp med amlodipine from 5mg  to 2.5mg  her blood pressure has been out of whack the last couple of days reading high.  She states it has been reading 180/102, 160/90's.  She states it may be her bp machine but she does not know.  She wanted to come here to the office to get it read but no one is able to see her today if reading is high.  I consulted with Dr. Ancil Boozer she told me to inform her to check at a local pharmacy and if was high go to urgent care or hospital.  Patient was very upset states she did not want to pay a copay for that and if the message was sent correctly yesterday you would of been here to address.  I told her you were not here and  would try to send message but you told us you would not be at a computer until Sunday.  I re addressed again if her blood pressure was to high that dr. Ancil Boozer and you would want her to be checked out somewhere. Please advise

## 2016-01-23 ENCOUNTER — Ambulatory Visit: Payer: Medicare HMO | Admitting: Family Medicine

## 2016-01-24 ENCOUNTER — Other Ambulatory Visit: Payer: Self-pay

## 2016-01-24 DIAGNOSIS — N8111 Cystocele, midline: Secondary | ICD-10-CM | POA: Diagnosis not present

## 2016-01-24 DIAGNOSIS — E039 Hypothyroidism, unspecified: Secondary | ICD-10-CM

## 2016-01-24 DIAGNOSIS — N812 Incomplete uterovaginal prolapse: Secondary | ICD-10-CM | POA: Diagnosis not present

## 2016-01-28 DIAGNOSIS — R69 Illness, unspecified: Secondary | ICD-10-CM | POA: Diagnosis not present

## 2016-02-04 DIAGNOSIS — L720 Epidermal cyst: Secondary | ICD-10-CM | POA: Diagnosis not present

## 2016-02-04 DIAGNOSIS — Z1283 Encounter for screening for malignant neoplasm of skin: Secondary | ICD-10-CM | POA: Diagnosis not present

## 2016-02-04 DIAGNOSIS — L821 Other seborrheic keratosis: Secondary | ICD-10-CM | POA: Diagnosis not present

## 2016-02-04 DIAGNOSIS — L700 Acne vulgaris: Secondary | ICD-10-CM | POA: Diagnosis not present

## 2016-02-04 DIAGNOSIS — L739 Follicular disorder, unspecified: Secondary | ICD-10-CM | POA: Diagnosis not present

## 2016-02-04 DIAGNOSIS — D229 Melanocytic nevi, unspecified: Secondary | ICD-10-CM | POA: Diagnosis not present

## 2016-02-04 DIAGNOSIS — Z85828 Personal history of other malignant neoplasm of skin: Secondary | ICD-10-CM | POA: Diagnosis not present

## 2016-02-10 NOTE — Telephone Encounter (Signed)
ERRENOUS °

## 2016-02-27 ENCOUNTER — Telehealth: Payer: Self-pay

## 2016-02-27 DIAGNOSIS — Z5181 Encounter for therapeutic drug level monitoring: Secondary | ICD-10-CM

## 2016-02-27 DIAGNOSIS — E039 Hypothyroidism, unspecified: Secondary | ICD-10-CM

## 2016-02-27 DIAGNOSIS — N183 Chronic kidney disease, stage 3 unspecified: Secondary | ICD-10-CM

## 2016-02-27 DIAGNOSIS — E785 Hyperlipidemia, unspecified: Secondary | ICD-10-CM

## 2016-02-27 DIAGNOSIS — I6523 Occlusion and stenosis of bilateral carotid arteries: Secondary | ICD-10-CM

## 2016-02-27 DIAGNOSIS — D473 Essential (hemorrhagic) thrombocythemia: Secondary | ICD-10-CM

## 2016-02-27 NOTE — Telephone Encounter (Signed)
Patient coming in tomorrow @ 10 fasting for labs.  Please order, or send me what you would like to order?

## 2016-02-28 DIAGNOSIS — I6523 Occlusion and stenosis of bilateral carotid arteries: Secondary | ICD-10-CM | POA: Diagnosis not present

## 2016-02-28 DIAGNOSIS — E785 Hyperlipidemia, unspecified: Secondary | ICD-10-CM | POA: Diagnosis not present

## 2016-02-28 DIAGNOSIS — E039 Hypothyroidism, unspecified: Secondary | ICD-10-CM | POA: Diagnosis not present

## 2016-02-28 DIAGNOSIS — Z5181 Encounter for therapeutic drug level monitoring: Secondary | ICD-10-CM | POA: Diagnosis not present

## 2016-02-28 LAB — COMPLETE METABOLIC PANEL WITH GFR
ALBUMIN: 4.5 g/dL (ref 3.6–5.1)
ALK PHOS: 85 U/L (ref 33–130)
ALT: 12 U/L (ref 6–29)
AST: 21 U/L (ref 10–35)
BILIRUBIN TOTAL: 1.8 mg/dL — AB (ref 0.2–1.2)
BUN: 25 mg/dL (ref 7–25)
CO2: 26 mmol/L (ref 20–31)
Calcium: 9.9 mg/dL (ref 8.6–10.4)
Chloride: 104 mmol/L (ref 98–110)
Creat: 1.13 mg/dL — ABNORMAL HIGH (ref 0.60–0.88)
GFR, Est African American: 50 mL/min — ABNORMAL LOW (ref >=60)
GFR, Est Non African American: 44 mL/min — ABNORMAL LOW (ref >=60)
GLUCOSE: 115 mg/dL — AB (ref 65–99)
Potassium: 4.8 mmol/L (ref 3.5–5.3)
SODIUM: 140 mmol/L (ref 135–146)
TOTAL PROTEIN: 7.5 g/dL (ref 6.1–8.1)

## 2016-02-28 LAB — LIPID PANEL
Cholesterol: 174 mg/dL (ref <200)
HDL: 60 mg/dL (ref >50)
LDL Cholesterol: 78 mg/dL (ref <100)
Total CHOL/HDL Ratio: 2.9 Ratio (ref <5.0)
Triglycerides: 181 mg/dL — ABNORMAL HIGH (ref <150)
VLDL: 36 mg/dL — ABNORMAL HIGH (ref <30)

## 2016-02-28 LAB — TSH: TSH: 2.07 m[IU]/L

## 2016-02-28 NOTE — Telephone Encounter (Signed)
I ordered new labs; thank you You can disregard the old TSH and others that are old; this way, they'll all be on the same requisition order CBC is being monitored by hematologist, so I did not order that; if she'd like to have that drawn and faxed to her hematologist, that okay to order (CBC, dx essential thrombocytosis, D47.3) Thank you

## 2016-02-28 NOTE — Telephone Encounter (Signed)
Amber states came by this am to get labs done

## 2016-02-28 NOTE — Assessment & Plan Note (Signed)
Check TSH 

## 2016-02-28 NOTE — Assessment & Plan Note (Signed)
Check lipids 

## 2016-02-28 NOTE — Assessment & Plan Note (Signed)
Check sgpt on statin 

## 2016-02-28 NOTE — Assessment & Plan Note (Signed)
Monitor Cr and K+ 

## 2016-02-28 NOTE — Assessment & Plan Note (Signed)
Managed by heme-onc 

## 2016-02-28 NOTE — Assessment & Plan Note (Signed)
Check lipids, ideal goal LDL less than 70

## 2016-02-29 ENCOUNTER — Other Ambulatory Visit: Payer: Self-pay | Admitting: Family Medicine

## 2016-02-29 DIAGNOSIS — R17 Unspecified jaundice: Secondary | ICD-10-CM | POA: Insufficient documentation

## 2016-02-29 NOTE — Progress Notes (Signed)
Orders entered for labs (monday) and [place] (monday or Tuesday)

## 2016-02-29 NOTE — Assessment & Plan Note (Signed)
Discussed w/pt by phone; order liver US, recheck labs with direct vs indirect bili on Monday; to ER if needed

## 2016-03-02 ENCOUNTER — Other Ambulatory Visit: Payer: Self-pay

## 2016-03-02 DIAGNOSIS — R17 Unspecified jaundice: Secondary | ICD-10-CM

## 2016-03-02 LAB — HEPATIC FUNCTION PANEL
ALT: 13 U/L (ref 6–29)
AST: 19 U/L (ref 10–35)
Albumin: 4.1 g/dL (ref 3.6–5.1)
Alkaline Phosphatase: 75 U/L (ref 33–130)
BILIRUBIN DIRECT: 0.2 mg/dL (ref <=0.2)
BILIRUBIN INDIRECT: 1 mg/dL (ref 0.2–1.2)
TOTAL PROTEIN: 7.4 g/dL (ref 6.1–8.1)
Total Bilirubin: 1.2 mg/dL (ref 0.2–1.2)

## 2016-03-03 ENCOUNTER — Encounter: Payer: Self-pay | Admitting: Family Medicine

## 2016-03-03 DIAGNOSIS — T884XXA Failed or difficult intubation, initial encounter: Secondary | ICD-10-CM

## 2016-03-03 HISTORY — DX: Failed or difficult intubation, initial encounter: T88.4XXA

## 2016-03-05 ENCOUNTER — Ambulatory Visit
Admission: RE | Admit: 2016-03-05 | Discharge: 2016-03-05 | Disposition: A | Discharge status: Home / Self Care | Payer: Medicare HMO | Source: Ambulatory Visit | Attending: Family Medicine | Admitting: Family Medicine

## 2016-03-05 ENCOUNTER — Encounter: Payer: Self-pay | Admitting: Family Medicine

## 2016-03-05 DIAGNOSIS — R17 Unspecified jaundice: Secondary | ICD-10-CM | POA: Diagnosis not present

## 2016-03-16 DIAGNOSIS — I10 Essential (primary) hypertension: Secondary | ICD-10-CM | POA: Diagnosis not present

## 2016-03-16 DIAGNOSIS — I7381 Erythromelalgia: Secondary | ICD-10-CM | POA: Diagnosis not present

## 2016-03-16 DIAGNOSIS — N183 Chronic kidney disease, stage 3 (moderate): Secondary | ICD-10-CM | POA: Diagnosis not present

## 2016-03-16 DIAGNOSIS — E039 Hypothyroidism, unspecified: Secondary | ICD-10-CM | POA: Diagnosis not present

## 2016-03-16 DIAGNOSIS — Z79899 Other long term (current) drug therapy: Secondary | ICD-10-CM | POA: Diagnosis not present

## 2016-03-16 DIAGNOSIS — R234 Changes in skin texture: Secondary | ICD-10-CM | POA: Diagnosis not present

## 2016-03-16 DIAGNOSIS — E785 Hyperlipidemia, unspecified: Secondary | ICD-10-CM | POA: Diagnosis not present

## 2016-03-16 DIAGNOSIS — Z7982 Long term (current) use of aspirin: Secondary | ICD-10-CM | POA: Diagnosis not present

## 2016-03-16 DIAGNOSIS — D471 Chronic myeloproliferative disease: Secondary | ICD-10-CM | POA: Diagnosis not present

## 2016-03-16 DIAGNOSIS — R200 Anesthesia of skin: Secondary | ICD-10-CM | POA: Diagnosis not present

## 2016-03-16 DIAGNOSIS — K219 Gastro-esophageal reflux disease without esophagitis: Secondary | ICD-10-CM | POA: Diagnosis not present

## 2016-03-24 ENCOUNTER — Telehealth: Payer: Self-pay | Admitting: Family Medicine

## 2016-03-24 NOTE — Telephone Encounter (Signed)
Pt has a bruise and would like to know how to treat it. Please return her call.

## 2016-03-24 NOTE — Telephone Encounter (Signed)
Bruises will resolve on their own with time; if it's uncomfortable, she can use a heating pad for 15 minutes at LOW setting a few times a day; there is really nothing to rub on them to make them go away faster; it will change color over the course of the hemoglobin breaking down (purplish, yellow, green); thank you

## 2016-03-24 NOTE — Telephone Encounter (Signed)
Patient notified

## 2016-04-06 ENCOUNTER — Other Ambulatory Visit: Payer: Self-pay | Admitting: Family Medicine

## 2016-04-06 DIAGNOSIS — E039 Hypothyroidism, unspecified: Secondary | ICD-10-CM

## 2016-04-06 MED ORDER — LEVOTHYROXINE SODIUM 88 MCG PO TABS: 88.0000 ug | ORAL_TABLET | Freq: Every day | ORAL | 3 refills | Status: DC

## 2016-04-06 NOTE — Telephone Encounter (Signed)
Patient wanting 90 day supply

## 2016-04-06 NOTE — Telephone Encounter (Signed)
Was prescribed levothyroxine 64mcg. She is not needing a refill but asking that you switch it to from her getting 30 tablets per month to getting a 90day supply. Uses [facility]

## 2016-04-06 NOTE — Telephone Encounter (Signed)
Rx sent as requested.

## 2016-04-08 ENCOUNTER — Telehealth: Payer: Self-pay | Admitting: Family Medicine

## 2016-04-08 MED ORDER — HYDROCORTISONE ACETATE 25 MG RE SUPP: 25.0000 mg | Freq: Two times a day (BID) | RECTAL | 0 refills | Status: DC

## 2016-04-08 NOTE — Telephone Encounter (Signed)
Pt have hemorrhoids and preparation h is not helping. Asking for a stronger suppository. It began on Sunday. Please send to [facility]

## 2016-04-21 ENCOUNTER — Telehealth: Payer: Self-pay | Admitting: Family Medicine

## 2016-04-21 ENCOUNTER — Ambulatory Visit (INDEPENDENT_AMBULATORY_CARE_PROVIDER_SITE_OTHER): Payer: Medicare HMO | Admitting: Obstetrics & Gynecology

## 2016-04-21 ENCOUNTER — Encounter: Payer: Self-pay | Admitting: Obstetrics & Gynecology

## 2016-04-21 VITALS — BP 130/70 | HR 87 | Ht 63.0 in | Wt 148.0 lb

## 2016-04-21 DIAGNOSIS — N814 Uterovaginal prolapse, unspecified: Secondary | ICD-10-CM

## 2016-04-21 DIAGNOSIS — K649 Unspecified hemorrhoids: Secondary | ICD-10-CM | POA: Diagnosis not present

## 2016-04-21 MED ORDER — HYDROCORTISONE ACE-PRAMOXINE 1.85-1.15 % RE CREA: 1.0000 "application " | TOPICAL_CREAM | Freq: Every day | RECTAL | 2 refills | Status: DC

## 2016-04-21 MED ORDER — HYDROCORTISONE ACETATE 25 MG RE SUPP: 25.0000 mg | Freq: Two times a day (BID) | RECTAL | 0 refills | Status: DC

## 2016-04-21 NOTE — Telephone Encounter (Signed)
I spoke with patient I apologized for any confusion; I explained that I got her message to send Rx to pharmacy on 04/08/16 and so I sent it, but did not realize I needed to call her; I just sent the Rx I'll send again; she says the Rx that her gyn doctor sent today was too expensive Call if any issues Rx sent and confirmed, received by pharmacy at 3:11 pm today

## 2016-04-21 NOTE — Progress Notes (Addendum)
HPI:      [name] is a 83 y.o. [phone] who presents today for her pessary follow up and examination related to her pelvic floor weakening (since 2014).  Pt reports tolerating the pessary well with  no vaginal bleeding and  no vaginal discharge.  Symptoms of pelvic floor weakening have greatly improved. She is voiding and defecating without difficulty. She currently has a ring w support #3 pessary.  PMHx: The following portions of the patient's history were reviewed and updated as appropriate:            She  has a past medical history of Breast mass in female; Cancer ([place]); Cataract; Dermatitis; Difficult intubation (03/03/2016); Diverticulitis; Diverticulosis (05/28/2015); Essential thrombocytosis ([place]) (07/23/2015); Genital prolapse; GERD (gastroesophageal reflux disease); Headache; Hyperlipemia; Hyperlipidemia; Hypertension; Hypothyroidism; Hypothyroidism; Mild atherosclerosis of carotid artery (06/25/2015); MRI contraindicated due to metal implant; Prolapse of uterus; Radiculopathy; Renal cyst; Renal cyst; Rosacea; Shingles; Thrombocytosis ([place]); and Thyroid disease. She  has a past surgical history that includes Tonsillectomy; Appendectomy; Cesarean section; Joint replacement (Right); Thyroidectomy; Colon surgery; Cataract extraction w/PHACO (Left, 03/25/2015); Adenoidectomy; biopsy, right breast ; Total hip arthroplasty; and Thyroid lobectomy. Her family history includes Stroke in her father and mother. She  reports that she has quit smoking. She has never used smokeless tobacco. She reports that she does not drink alcohol or use drugs. She has a current medication list which includes the following prescription(s): amlodipine, aspirin ec, atorvastatin, vitamin d3, hydrocortisone, hydroxyurea, irbesartan, ketoconazole, levothyroxine, and ranitidine. She is allergic to novocain [procaine].  .Review of Systems  Constitutional: Negative for chills, fever and malaise/fatigue.  HENT: Negative for  congestion, sinus pain and sore throat.   Eyes: Negative for blurred vision and pain.  Respiratory: Negative for cough and wheezing.   Cardiovascular: Negative for chest pain and leg swelling.  Gastrointestinal: Negative for abdominal pain, constipation, diarrhea, heartburn, nausea and vomiting.  Genitourinary: Negative for dysuria, frequency, hematuria and urgency.  Musculoskeletal: Negative for back pain, joint pain, myalgias and neck pain.  Skin: Negative for itching and rash.  Neurological: Negative for dizziness, tremors and weakness.       Left shoulder nerve pain radiating to breast  Endo/Heme/Allergies: Does not bruise/bleed easily.  Psychiatric/Behavioral: Negative for depression. The patient is not nervous/anxious and does not have insomnia.    Physical Exam  Constitutional: She is oriented to person, place, and time. She appears well-developed and well-nourished. No distress.  Genitourinary: Vagina normal and uterus normal. Pelvic exam was performed with patient supine. There is no rash or lesion on the right labia. There is no rash or lesion on the left labia. Vagina exhibits no lesion. No bleeding in the vagina. Rectal exam shows external hemorrhoid.  Genitourinary Comments: Gr 3 uterine prolapse No erosions  Cardiovascular: Normal rate.   Pulmonary/Chest: Effort normal.  Abdominal: Soft. Bowel sounds are normal. She exhibits no distension. There is no tenderness. There is no rebound.  Musculoskeletal: Normal range of motion.  Neurological: She is alert and oriented to person, place, and time.  Skin: Skin is warm and dry.  Psychiatric: She has a normal mood and affect.  Vitals reviewed.    Pessary Care Pessary removed and cleaned.  Vagina checked - without erosions - pessary replaced.  A/P:  Pessary was cleaned and replaced today. Instructions given for care. Concerning symptoms to observe for are counseled to patient. Follow up scheduled for 3 months.  Normal  breast exam. To see PCP for nerve pain she has been  having.  Hemorrhoids, will change from OTC medicine to Rx.

## 2016-04-21 NOTE — Telephone Encounter (Signed)
Pt never received a return call pertaining to the message below. She is asking that you please help please return call.

## 2016-04-22 ENCOUNTER — Other Ambulatory Visit: Payer: Self-pay | Admitting: Family Medicine

## 2016-04-22 ENCOUNTER — Telehealth: Payer: Self-pay

## 2016-04-22 MED ORDER — HYDROCORTISONE 2.5 % RE CREA: 1.0000 "application " | TOPICAL_CREAM | Freq: Two times a day (BID) | RECTAL | 0 refills | Status: DC

## 2016-04-22 NOTE — Telephone Encounter (Signed)
I contacted this patient to inform her that a new medication has been sent in to replace the generic Anusol, but her husband, Rob, stated that she was not in.  I told him who I was and why I was calling. I also told him why the other medication was so high ($100 deductible has not been met) and how much the new medication (Proctozone) should cost ($47.82) according to Michelle, rep with [employer]. He said that they didn't know that and that he would let her know. 

## 2016-04-22 NOTE — Progress Notes (Signed)
anusol hc too expensive [place] checked with insurance; proctozone much less $$ New Rx sent

## 2016-04-23 ENCOUNTER — Encounter: Payer: Self-pay | Admitting: Obstetrics & Gynecology

## 2016-04-23 DIAGNOSIS — Z1231 Encounter for screening mammogram for malignant neoplasm of breast: Secondary | ICD-10-CM | POA: Diagnosis not present

## 2016-04-28 ENCOUNTER — Encounter: Payer: Self-pay | Admitting: Obstetrics & Gynecology

## 2016-05-12 ENCOUNTER — Telehealth: Payer: Self-pay | Admitting: *Deleted

## 2016-05-12 DIAGNOSIS — N183 Chronic kidney disease, stage 3 (moderate): Secondary | ICD-10-CM | POA: Diagnosis not present

## 2016-05-12 DIAGNOSIS — I10 Essential (primary) hypertension: Secondary | ICD-10-CM | POA: Diagnosis not present

## 2016-05-12 DIAGNOSIS — N281 Cyst of kidney, acquired: Secondary | ICD-10-CM | POA: Diagnosis not present

## 2016-05-12 NOTE — Telephone Encounter (Signed)
atient being referred from [facility] originally slated for [employer] but requests change to [place]. Registration given information for provider change.

## 2016-06-01 ENCOUNTER — Inpatient Hospital Stay: Payer: Medicare HMO | Attending: Hematology and Oncology

## 2016-06-01 ENCOUNTER — Encounter: Payer: Self-pay | Admitting: Hematology and Oncology

## 2016-06-01 ENCOUNTER — Inpatient Hospital Stay: Payer: Medicare HMO | Attending: Hematology and Oncology | Admitting: Hematology and Oncology

## 2016-06-01 VITALS — BP 132/71 | HR 83 | Temp 95.5°F | Resp 18 | Wt 147.4 lb

## 2016-06-01 DIAGNOSIS — E785 Hyperlipidemia, unspecified: Secondary | ICD-10-CM

## 2016-06-01 DIAGNOSIS — I10 Essential (primary) hypertension: Secondary | ICD-10-CM | POA: Diagnosis not present

## 2016-06-01 DIAGNOSIS — I7381 Erythromelalgia: Secondary | ICD-10-CM | POA: Insufficient documentation

## 2016-06-01 DIAGNOSIS — Z7982 Long term (current) use of aspirin: Secondary | ICD-10-CM | POA: Diagnosis not present

## 2016-06-01 DIAGNOSIS — K219 Gastro-esophageal reflux disease without esophagitis: Secondary | ICD-10-CM

## 2016-06-01 DIAGNOSIS — N819 Female genital prolapse, unspecified: Secondary | ICD-10-CM | POA: Insufficient documentation

## 2016-06-01 DIAGNOSIS — Z8719 Personal history of other diseases of the digestive system: Secondary | ICD-10-CM | POA: Diagnosis not present

## 2016-06-01 DIAGNOSIS — Z87891 Personal history of nicotine dependence: Secondary | ICD-10-CM

## 2016-06-01 DIAGNOSIS — Z79899 Other long term (current) drug therapy: Secondary | ICD-10-CM | POA: Insufficient documentation

## 2016-06-01 DIAGNOSIS — E039 Hypothyroidism, unspecified: Secondary | ICD-10-CM | POA: Diagnosis not present

## 2016-06-01 DIAGNOSIS — N281 Cyst of kidney, acquired: Secondary | ICD-10-CM | POA: Diagnosis not present

## 2016-06-01 DIAGNOSIS — D473 Essential (hemorrhagic) thrombocythemia: Secondary | ICD-10-CM | POA: Diagnosis not present

## 2016-06-01 DIAGNOSIS — L719 Rosacea, unspecified: Secondary | ICD-10-CM | POA: Diagnosis not present

## 2016-06-01 DIAGNOSIS — Z85828 Personal history of other malignant neoplasm of skin: Secondary | ICD-10-CM | POA: Diagnosis not present

## 2016-06-01 DIAGNOSIS — M5410 Radiculopathy, site unspecified: Secondary | ICD-10-CM | POA: Diagnosis not present

## 2016-06-01 DIAGNOSIS — N63 Unspecified lump in unspecified breast: Secondary | ICD-10-CM | POA: Insufficient documentation

## 2016-06-01 DIAGNOSIS — R252 Cramp and spasm: Secondary | ICD-10-CM | POA: Insufficient documentation

## 2016-06-01 DIAGNOSIS — R17 Unspecified jaundice: Secondary | ICD-10-CM

## 2016-06-01 LAB — CBC WITH DIFFERENTIAL/PLATELET
Basophils Absolute: 0.1 10*3/uL (ref 0–0.1)
Basophils Relative: 1 %
Eosinophils Absolute: 0.3 10*3/uL (ref 0–0.7)
Eosinophils Relative: 4 %
HCT: 41.1 % (ref 35.0–47.0)
Hemoglobin: 14 g/dL (ref 12.0–16.0)
Lymphocytes Relative: 19 %
Lymphs Abs: 1.5 10*3/uL (ref 1.0–3.6)
MCH: 31.5 pg (ref 26.0–34.0)
MCHC: 34.2 g/dL (ref 32.0–36.0)
MCV: 92.1 fL (ref 80.0–100.0)
Monocytes Absolute: 0.7 10*3/uL (ref 0.2–0.9)
Monocytes Relative: 9 %
Neutro Abs: 5.4 10*3/uL (ref 1.4–6.5)
Neutrophils Relative %: 67 %
Platelets: 378 10*3/uL (ref 150–440)
RBC: 4.46 MIL/uL (ref 3.80–5.20)
RDW: 15 % — ABNORMAL HIGH (ref 11.5–14.5)
WBC: 8 10*3/uL (ref 3.6–11.0)

## 2016-06-01 LAB — COMPREHENSIVE METABOLIC PANEL
ALT: 36 U/L (ref 14–54)
AST: 35 U/L (ref 15–41)
Albumin: 4.4 g/dL (ref 3.5–5.0)
Alkaline Phosphatase: 86 U/L (ref 38–126)
Anion gap: 6 (ref 5–15)
BUN: 31 mg/dL — ABNORMAL HIGH (ref 6–20)
CO2: 26 mmol/L (ref 22–32)
Calcium: 9.4 mg/dL (ref 8.9–10.3)
Chloride: 103 mmol/L (ref 101–111)
Creatinine, Ser: 1.29 mg/dL — ABNORMAL HIGH (ref 0.44–1.00)
GFR calc Af Amer: 42 mL/min — ABNORMAL LOW (ref >60)
GFR calc non Af Amer: 36 mL/min — ABNORMAL LOW (ref >60)
Glucose, Bld: 101 mg/dL — ABNORMAL HIGH (ref 65–99)
Potassium: 4.6 mmol/L (ref 3.5–5.1)
Sodium: 135 mmol/L (ref 135–145)
Total Bilirubin: 1.5 mg/dL — ABNORMAL HIGH (ref 0.3–1.2)
Total Protein: 7.9 g/dL (ref 6.5–8.1)

## 2016-06-01 LAB — BILIRUBIN, DIRECT: Bilirubin, Direct: 0.1 mg/dL (ref 0.1–0.5)

## 2016-06-01 NOTE — Progress Notes (Signed)
Patient is here today to see Dr. Mike Gip regarding essential thrombocytopenia.  Patient is a former patient of Dr. Rogue Bussing.  She left him and went to [facility] to Dr. Adriana Simas.  She wanted to come back to [facility] because of the drive/distance.  Patient's husband is a patient of Dr. Grayland Ormond.

## 2016-06-01 NOTE — Progress Notes (Signed)
[facility] day:  06/01/2016  Chief Complaint: Gina [name] is a 83 y.o. female with essential thrombocythemia (ET) who is seen for new patient assessment.  HPI:  The patient began to experience a cold sensation in her legs in 06/2013.  She was seen by neurology in 06/2015 and felt to have a neuropathy.  Platelet count was in the 600,000 range.  JAK2 testing on 06/24/2015 was positive with no MPL mutation. BCR-ABL was negative by FISH.  CBC on 07/23/2015 included a hematocrit of 42.6, hemoglobin 14.5, MCV 83.9, platelet count 673,000, and WBC10,700.  The patient was initially seen by Dr. Rogue Bussing.  Recommendations were for initiation of hydroxyurea. She was concerned about the side effects and sought a second opinion with Dr. Adriana Simas on 10/02/2015.  Decision was made to forgo a bone marrow to distinguish MPN/MDS overlap syndrome and pre-fibrotic MF given her age and classic presentation.  She described her lower extremity symptoms without skin discoloration or symptoms of claudication. She denied any history of thrombosis (CVA, MI or DVT).  She was on a baby aspirin. She denied any after bath itching, headaches, dizziness, syncope, chest pain or visual disturbances.  She was seen in follow-up on 03/16/2016.  She was on hydroxyurea 500 mg po q Monday, Wednesday, and Friday.  CBC revealed a hematocrit of 43.1, hemoglobin 14.3, platelets 420,000, WBC 8100 with an ANC of 6000.  It was noted that her erythromelalgia had improved significantly on aspirin BID.  She denied any gastric irritation.  It was recommended that she have annual skin exams secondary to the risk of non-melanoma skin cancers.  Symptomatically, she notes occasional charley horses in her calves. She denies any significant erythromelalgia.  She denies any headaches, visual changes, or shortness of breath. She continues hydroxyurea every Monday, Wednesday, and Friday.   Past Medical  History:  Diagnosis Date  . Breast mass in female    right breast  . Cancer ([place])    SKIN  . Cataract   . Dermatitis   . Difficult intubation 03/03/2016   September 06, 2013; left nasal fiberoptic intubation #7 ETT; see letter from Dr. Loanne Drilling, Dept of Anesthesiology, [facility]  . Diverticulitis   . Diverticulosis 05/28/2015  . Essential thrombocytosis ([place]) 07/23/2015  . Genital prolapse   . GERD (gastroesophageal reflux disease)   . Headache   . Hyperlipemia   . Hyperlipidemia    history of   . Hypertension   . Hypothyroidism   . Hypothyroidism   . Mild atherosclerosis of carotid artery 06/25/2015  . MRI contraindicated due to metal implant   . Prolapse of uterus   . Radiculopathy   . Renal cyst   . Renal cyst   . Rosacea   . Shingles   . Thrombocytosis ([place])   . Thyroid disease     Past Surgical History:  Procedure Laterality Date  . ADENOIDECTOMY    . APPENDECTOMY    . biopsy, right breast     . CATARACT EXTRACTION W/PHACO Left 03/25/2015   Procedure: CATARACT EXTRACTION PHACO AND INTRAOCULAR LENS PLACEMENT (IOC);  Surgeon: Estill Cotta, MD;  Location: [facility] ORS;  Service: Ophthalmology;  Laterality: Left;  [place] 02:09 AP% 27.3 CDE 59.97 fluid pack lot #[id number]  . CESAREAN SECTION    . COLON SURGERY    . JOINT REPLACEMENT Right    total hip  . THYROID LOBECTOMY    . THYROIDECTOMY     partial  . TONSILLECTOMY    .  TOTAL HIP ARTHROPLASTY      Family History  Problem Relation Age of Onset  . Stroke Mother   . Stroke Father     Social History:  reports that she has quit smoking. She has never used smokeless tobacco. She reports that she does not drink alcohol or use drugs.  She is from [place].  She has lived in [place] for 20 years.  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  She has rolled bandages.  She will be 89 in 2 weeks.  She has 8 children (3 + 5 between she and her husband).  Her husband is  38.  He has stage IV disease and is treated by Dr. Grayland Ormond.  The patient is alone today.  Allergies:  Allergies  Allergen Reactions  . Novocain [Procaine] Palpitations    Current Medications: Current Outpatient Prescriptions  Medication Sig Dispense Refill  . amLODipine (NORVASC) 5 MG tablet Take 1 tablet (5 mg total) by mouth daily. 90 tablet 3  . aspirin EC 81 MG tablet Take 81 mg by mouth 2 (two) times daily.     Marland Kitchen atorvastatin (LIPITOR) 20 MG tablet Take 1 tablet (20 mg total) by mouth daily. 90 tablet 1  . Cholecalciferol (VITAMIN D3) 1000 units CAPS Take by mouth daily.     . hydroxyurea (HYDREA) 500 MG capsule Take 500 mg by mouth. Three days per week    . irbesartan (AVAPRO) 150 MG tablet Take 1 tablet (150 mg total) by mouth daily. 90 tablet 5  . ketoconazole (NIZORAL) 2 % shampoo Apply 1 application topically 2 (two) times a week.     . levothyroxine (SYNTHROID, LEVOTHROID) 88 MCG tablet Take 1 tablet (88 mcg total) by mouth daily before breakfast. 90 tablet 3  . ranitidine (ZANTAC) 150 MG tablet Take 150 mg by mouth daily. AS NEEDED    . hydrocortisone (PROCTOZONE-HC) 2.5 % rectal cream Place 1 application rectally 2 (two) times daily. (Patient not taking: Reported on 06/01/2016) 30 g 0  . Hydrocortisone Ace-Pramoxine (PROCORT) 1.85-1.15 % CREA Place 1 application rectally daily. (Patient not taking: Reported on 06/01/2016) 60 g 2   No current facility-administered medications for this visit.     Review of Systems:  GENERAL:  Feels good.  Doesn't feel like she is in her 84s.  No fevers, sweats or weight loss. PERFORMANCE STATUS (ECOG):  1 HEENT:  No visual changes, runny nose, sore throat, mouth sores or tenderness. Lungs: No shortness of breath or cough.  No hemoptysis. Cardiac:  No chest pain, palpitations, orthopnea, or PND. GI:  No nausea, vomiting, diarrhea, constipation, melena or hematochezia. GU:  No urgency, frequency, dysuria, or hematuria. Musculoskeletal:   Charlie horse in calves, intermittent.  No back pain.  No joint pain.  No muscle tenderness. Extremities:  No pain or swelling. Skin:  No rashes or skin changes. Neuro:  No headache, numbness or weakness, balance or coordination issues. Endocrine:  No diabetes.  Thyroid issues disease on Synthroid.  No hot flashes or night sweats. Psych:  No mood changes, depression or anxiety. Pain:  No focal pain. Review of systems:  All other systems reviewed and found to be negative.  Physical Exam: Blood pressure 132/71, pulse 83, temperature (!) 95.5 F (35.3 C), temperature source Tympanic, resp. rate 18, weight 147 lb 7 oz (66.9 kg). GENERAL:  Well developed, well nourished, woman sitting comfortably in the exam room in no acute distress.  MENTAL STATUS:  Alert and oriented to person, place and time. HEAD:  Short gray hair.  Normocephalic, atraumatic, face symmetric, no Cushingoid features. EYES:  Brown eyes.  Pupils equal round and reactive to light and accomodation.  No conjunctivitis or scleral icterus. ENT:  Oropharynx clear without lesion.  Tongue normal. Mucous membranes moist.  RESPIRATORY:  Clear to auscultation without rales, wheezes or rhonchi. CARDIOVASCULAR:  Regular rate and rhythm without murmur, rub or gallop. ABDOMEN:  Soft, non-tender, with active bowel sounds, and no hepatosplenomegaly.  No masses. SKIN:  No rashes, ulcers or lesions. EXTREMITIES: No edema, no skin discoloration or tenderness.  No palpable cords. LYMPH NODES: No palpable cervical, supraclavicular, axillary or inguinal adenopathy  NEUROLOGICAL: Unremarkable. PSYCH:  Appropriate.   Appointment on 06/01/2016  Component Date Value Ref Range Status  . WBC 06/01/2016 8.0  3.6 - 11.0 K/uL Final  . RBC 06/01/2016 4.46  3.80 - 5.20 MIL/uL Final  . Hemoglobin 06/01/2016 14.0  12.0 - 16.0 g/dL Final  . HCT 06/01/2016 41.1  35.0 - 47.0 % Final  . MCV 06/01/2016 92.1  80.0 - 100.0 fL Final  . MCH 06/01/2016 31.5  26.0 -  34.0 pg Final  . MCHC 06/01/2016 34.2  32.0 - 36.0 g/dL Final  . RDW 06/01/2016 15.0* 11.5 - 14.5 % Final  . Platelets 06/01/2016 378  150 - 440 K/uL Final  . Neutrophils Relative % 06/01/2016 67  % Final  . Neutro Abs 06/01/2016 5.4  1.4 - 6.5 K/uL Final  . Lymphocytes Relative 06/01/2016 19  % Final  . Lymphs Abs 06/01/2016 1.5  1.0 - 3.6 K/uL Final  . Monocytes Relative 06/01/2016 9  % Final  . Monocytes Absolute 06/01/2016 0.7  0.2 - 0.9 K/uL Final  . Eosinophils Relative 06/01/2016 4  % Final  . Eosinophils Absolute 06/01/2016 0.3  0 - 0.7 K/uL Final  . Basophils Relative 06/01/2016 1  % Final  . Basophils Absolute 06/01/2016 0.1  0 - 0.1 K/uL Final  . Sodium 06/01/2016 135  135 - 145 mmol/L Final  . Potassium 06/01/2016 4.6  3.5 - 5.1 mmol/L Final  . Chloride 06/01/2016 103  101 - 111 mmol/L Final  . CO2 06/01/2016 26  22 - 32 mmol/L Final  . Glucose, Bld 06/01/2016 101* 65 - 99 mg/dL Final  . BUN 06/01/2016 31* 6 - 20 mg/dL Final  . Creatinine, Ser 06/01/2016 1.29* 0.44 - 1.00 mg/dL Final  . Calcium 06/01/2016 9.4  8.9 - 10.3 mg/dL Final  . Total Protein 06/01/2016 7.9  6.5 - 8.1 g/dL Final  . Albumin 06/01/2016 4.4  3.5 - 5.0 g/dL Final  . AST 06/01/2016 35  15 - 41 U/L Final  . ALT 06/01/2016 36  14 - 54 U/L Final  . Alkaline Phosphatase 06/01/2016 86  38 - 126 U/L Final  . Total Bilirubin 06/01/2016 1.5* 0.3 - 1.2 mg/dL Final  . GFR calc non Af Amer 06/01/2016 36* >60 mL/min Final  . GFR calc Af Amer 06/01/2016 42* >60 mL/min Final   Comment: (NOTE) The eGFR has been calculated using the CKD EPI equation. This calculation has not been validated in all clinical situations. eGFR's persistently <60 mL/min signify possible Chronic Kidney Disease.   . Anion gap 06/01/2016 6  5 - 15 Final    Assessment:  Makenize Messman is a 83 y.o. female with essential thrombocythemia (ET) diagnosed on 06/24/2015.  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  MPL is negative.  She is on  hydroxyurea 500 mg po  every Monday, Wednesday and Friday.  She is on a baby aspirin BID.  She has mild renal insufficiency (CrCl 36 ml/min).  She has intermittent mild hyperbilirubinemia (? Gilbert's disease).  Symptomatically, she has intermittent calf cramps.  Erythromelalgia symptoms are fading.  Exam is unremarkable.   Bilirubin is slightly elevated (1.5).  Plan: 1.  Discuss entire medical history, diagnosis and management of essential thrombocythemia (ET).  Goal platelet count is < 400,000. 2.  Labs today:  CBC with diff, CMP, direct bilirubin. 3.  Continue hydroxyurea 500 mg q Mon, Wed, Fri. 4.  Continue aspirin 81 mg po BID. 5.  RTC in 3 months for MD assessment and labs (CBC with diff, CMP).   Lequita Asal, MD  06/01/2016, 12:14 PM

## 2016-06-02 ENCOUNTER — Telehealth: Payer: Self-pay | Admitting: *Deleted

## 2016-06-02 NOTE — Telephone Encounter (Signed)
Called patient and discussed the results of the direct bili and patient had no questions. Voiced understanding.

## 2016-06-11 ENCOUNTER — Telehealth: Payer: Self-pay | Admitting: Family Medicine

## 2016-06-11 NOTE — Telephone Encounter (Signed)
Please return patient's call

## 2016-06-11 NOTE — Telephone Encounter (Signed)
Pt wanted to know if she has to come to her  appointment on Jun 30 2016 fasting. I looked in her notes and saw the last time she did a lipid panel was January 2018 therefore I suggest to come  in fasting.

## 2016-06-18 ENCOUNTER — Telehealth: Payer: Self-pay | Admitting: Family Medicine

## 2016-06-18 DIAGNOSIS — I6523 Occlusion and stenosis of bilateral carotid arteries: Secondary | ICD-10-CM

## 2016-06-18 NOTE — Assessment & Plan Note (Signed)
Less than 50% bilaterally May 2017; rescan

## 2016-06-18 NOTE — Telephone Encounter (Signed)
Please let pt know that it's coming time to rescan her carotids; I've placed order; thank you

## 2016-06-18 NOTE — Telephone Encounter (Signed)
-----   Message from Arnetha Courser, MD sent at 06/25/2015  6:16 PM EDT ----- Regarding: re-image carotids in one year One year carotid scan due around Jun 24, 2016

## 2016-06-19 NOTE — Telephone Encounter (Signed)
I contacted this patient to inform her that she has been scheduled for her [place] on  06/25/16 @1 :30 at the [facility]. She declined this appointment and asked what she has to do to cancel it since she had one last year. I told her that I would call them back and cancel it for her then I would let Dr. Sanda Klein know that she wanted to talk to her about this during her next visit.   She told me that she was upset that appointments are being made without her being told about them first and she wanted that to stop. I apologize and told her that although I did receive a message from Dr. Sanda Klein about getting her scheduled for a repeat US, I should have called first to see if it was ok to proceed. She said that was ok and that she was glad that I called because she was scheduled last year to see someone at [facility] and since she did not show she was charged a $35 fee. She stated that she is not fond of [place] and could not understand why when she called to our office she was told by our staff that we could not cancel an appt at another office. I informed her that although we normally do no schedule the appts we could in fact call and cancel if it is the patient's preference.  She said it was ok and that she appreciated my help, but will talk to Dr. Sanda Klein about this when she comes in.

## 2016-06-22 DIAGNOSIS — H2511 Age-related nuclear cataract, right eye: Secondary | ICD-10-CM | POA: Diagnosis not present

## 2016-06-25 ENCOUNTER — Ambulatory Visit: Payer: Medicare HMO

## 2016-06-26 ENCOUNTER — Other Ambulatory Visit: Payer: Self-pay | Admitting: Hematology and Oncology

## 2016-06-26 ENCOUNTER — Other Ambulatory Visit: Payer: Self-pay | Admitting: Internal Medicine

## 2016-06-26 DIAGNOSIS — D75839 Thrombocytosis, unspecified: Secondary | ICD-10-CM

## 2016-06-26 DIAGNOSIS — D473 Essential (hemorrhagic) thrombocythemia: Secondary | ICD-10-CM

## 2016-06-30 ENCOUNTER — Ambulatory Visit (INDEPENDENT_AMBULATORY_CARE_PROVIDER_SITE_OTHER): Payer: Medicare HMO | Admitting: Family Medicine

## 2016-06-30 ENCOUNTER — Encounter: Payer: Self-pay | Admitting: Family Medicine

## 2016-06-30 DIAGNOSIS — E785 Hyperlipidemia, unspecified: Secondary | ICD-10-CM

## 2016-06-30 DIAGNOSIS — D473 Essential (hemorrhagic) thrombocythemia: Secondary | ICD-10-CM | POA: Diagnosis not present

## 2016-06-30 DIAGNOSIS — M4722 Other spondylosis with radiculopathy, cervical region: Secondary | ICD-10-CM

## 2016-06-30 DIAGNOSIS — I6523 Occlusion and stenosis of bilateral carotid arteries: Secondary | ICD-10-CM

## 2016-06-30 DIAGNOSIS — E039 Hypothyroidism, unspecified: Secondary | ICD-10-CM

## 2016-06-30 DIAGNOSIS — I10 Essential (primary) hypertension: Secondary | ICD-10-CM

## 2016-06-30 HISTORY — DX: Gilbert syndrome: E80.4

## 2016-06-30 NOTE — Assessment & Plan Note (Signed)
Known arthritis in the neck; not limiting activities; will follow for now

## 2016-06-30 NOTE — Assessment & Plan Note (Signed)
Well controlled today.

## 2016-06-30 NOTE — Assessment & Plan Note (Addendum)
Discussed results of last carotid scan; taking 81 mg BID; she agrees to checking carotid US this year; ordered

## 2016-06-30 NOTE — Progress Notes (Signed)
BP 128/70   Pulse 86   Temp 97.4 F (36.3 C) (Oral)   Resp 14   Wt 146 lb (66.2 kg)   SpO2 93%   BMI 25.86 kg/m    Subjective:    Patient ID: Gina [name], female    DOB: 27-Aug-1926, 83 y.o.   MRN: [id number]  HPI: Gina [name] is a 83 y.o. female  Chief Complaint  Patient presents with  . Follow-up  . Foot Pain    fell left ankle pain    HPI Patient is here for f/u; she suffered a purely mechanical fall, 3 weeks ago, hurt inner left ankle; bruised up, resolving; able to bear weight; still healing   Sees Dr. Mike Gip at [facility] The "old devil Mr. Bilirubin, that guy" was abnormal she jokes, diagnosed by heme-onc doctor with Gilbert's syndrome; nothing to do for that she says Not seeing Dr. Melissa Montane at [facility] any more, too much of a hassle Caregiver for stage IV metastatic prostate cancer ([place]) and they don't have a car any more, so transportation is an issue  She would like to be advised of appointments before they are made; she brought up the carotid scan that was ordered and schedule; she had less than 50% blockage bilaterally  She feels cracking in the neck and pain in the left shoulder; that is lately; could be arthritis  Having loud hiccups, with age that doesn't close completely the flap at the stomach; not a lot of reflux or heartburn; no abdominal pain  She has psoriasis she thinks; she has red spots on the arms; does not look like psoriasis I explained  Chronic constipation; takes the miralax; has had this her whole life; has hemorrhoids; no pain, no bleeding, not bothersome; her gynecologist Dr. Kenton Kingfisher gave her two prescriptions, one for a cream and one for a suppository; $55 and $100  Has hayfever that is "something else"  I reviewed last lipids; last TSH normal (hypothyroidism) Lab Results  Component Value Date   CHOL 174 02/28/2016   CHOL 170 09/02/2015   CHOL 268 (H) 05/29/2015   Lab Results  Component Value Date   HDL 60  02/28/2016   HDL 55 09/02/2015   HDL 49 05/29/2015   Lab Results  Component Value Date   LDLCALC 78 02/28/2016   LDLCALC 80 09/02/2015   LDLCALC 177 (H) 05/29/2015   Lab Results  Component Value Date   TRIG 181 (H) 02/28/2016   TRIG 173 (H) 09/02/2015   TRIG 208 (H) 05/29/2015   Lab Results  Component Value Date   CHOLHDL 2.9 02/28/2016   No results found for: LDLDIRECT   Lab Results  Component Value Date   TSH 2.07 02/28/2016     Depression screen [facility] 2/9 06/30/2016 12/31/2015 12/19/2014 10/05/2014  Decreased Interest 0 0 0 0  Down, Depressed, Hopeless 1 0 0 0  PHQ - 2 Score 1 0 0 0   Relevant past medical, surgical, family and social history reviewed Past Medical History:  Diagnosis Date  . Breast mass in female    right breast  . Cancer ([place])    SKIN  . Cataract   . Dermatitis   . Difficult intubation 03/03/2016   September 06, 2013; left nasal fiberoptic intubation #7 ETT; see letter from Dr. Loanne Drilling, Dept of Anesthesiology, [facility]  . Diverticulitis   . Diverticulosis 05/28/2015  . Essential thrombocytosis ([place]) 07/23/2015  . Genital prolapse   . GERD (gastroesophageal reflux disease)   .  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 Mike Gip  . Headache   . Hyperlipemia   . Hyperlipidemia    history of   . Hypertension   . Hypothyroidism   . Hypothyroidism   . Mild atherosclerosis of carotid artery 06/25/2015  . MRI contraindicated due to metal implant   . Prolapse of uterus   . Radiculopathy   . Renal cyst   . Renal cyst   . Rosacea   . Shingles   . Thrombocytosis ([place])   . Thyroid disease    Past Surgical History:  Procedure Laterality Date  . ADENOIDECTOMY    . APPENDECTOMY    . biopsy, right breast     . CATARACT EXTRACTION W/PHACO Left 03/25/2015   Procedure: CATARACT EXTRACTION PHACO AND INTRAOCULAR LENS PLACEMENT (IOC);  Surgeon: Estill Cotta, MD;  Location: [facility] ORS;  Service: Ophthalmology;  Laterality: Left;  [place] 02:09 AP% 27.3 CDE  59.97 fluid pack lot #[id number]  . CESAREAN SECTION    . COLON SURGERY    . JOINT REPLACEMENT Right    total hip  . THYROID LOBECTOMY    . THYROIDECTOMY     partial  . TONSILLECTOMY    . TOTAL HIP ARTHROPLASTY     family history includes Dementia in her maternal grandfather; Stroke in her father and mother.   Social History   Social History  . Marital status: Married    Spouse name: N/A  . Number of children: N/A  . Years of education: N/A   Occupational History  . Not on file.   Social History Main Topics  . Smoking status: Former Research scientist (life sciences)  . Smokeless tobacco: Never Used  . Alcohol use No  . Drug use: No  . Sexual activity: No   Other Topics Concern  . Not on file   Social History Narrative  . No narrative on file   Interim medical history since last visit reviewed. Allergies and medications reviewed  Review of Systems Per HPI unless specifically indicated above     Objective:    BP 128/70   Pulse 86   Temp 97.4 F (36.3 C) (Oral)   Resp 14   Wt 146 lb (66.2 kg)   SpO2 93%   BMI 25.86 kg/m   Wt Readings from Last 3 Encounters:  06/30/16 146 lb (66.2 kg)  06/01/16 147 lb 7 oz (66.9 kg)  04/21/16 148 lb (67.1 kg)    Physical Exam  Constitutional: She appears well-developed and well-nourished. No distress.  HENT:  Head: Normocephalic and atraumatic.  Eyes: No scleral icterus.  Neck: Carotid bruit is not present. No thyromegaly present.  Cardiovascular: Normal rate and regular rhythm.   Pulmonary/Chest: Effort normal and breath sounds normal.  Abdominal: Soft. She exhibits no distension.  Musculoskeletal: She exhibits no edema.  Neurological: She is alert.  Bright, energetic for age  Skin:  Few macular erythematous macules on the upper arms; fingernails painted; no palmar erythema  Psychiatric: She has a normal mood and affect. Her mood appears not anxious. Cognition and memory are not impaired. She does not exhibit a depressed mood. She exhibits  normal recent memory and normal remote memory.   Results for orders placed or performed in visit on 06/01/16  CBC with Differential  Result Value Ref Range   WBC 8.0 3.6 - 11.0 K/uL   RBC 4.46 3.80 - 5.20 MIL/uL   Hemoglobin 14.0 12.0 - 16.0 g/dL   HCT 41.1 35.0 - 47.0 %   MCV  92.1 80.0 - 100.0 fL   MCH 31.5 26.0 - 34.0 pg   MCHC 34.2 32.0 - 36.0 g/dL   RDW 15.0 (H) 11.5 - 14.5 %   Platelets 378 150 - 440 K/uL   Neutrophils Relative % 67 %   Neutro Abs 5.4 1.4 - 6.5 K/uL   Lymphocytes Relative 19 %   Lymphs Abs 1.5 1.0 - 3.6 K/uL   Monocytes Relative 9 %   Monocytes Absolute 0.7 0.2 - 0.9 K/uL   Eosinophils Relative 4 %   Eosinophils Absolute 0.3 0 - 0.7 K/uL   Basophils Relative 1 %   Basophils Absolute 0.1 0 - 0.1 K/uL  Comprehensive metabolic panel  Result Value Ref Range   Sodium 135 135 - 145 mmol/L   Potassium 4.6 3.5 - 5.1 mmol/L   Chloride 103 101 - 111 mmol/L   CO2 26 22 - 32 mmol/L   Glucose, Bld 101 (H) 65 - 99 mg/dL   BUN 31 (H) 6 - 20 mg/dL   Creatinine, Ser 1.29 (H) 0.44 - 1.00 mg/dL   Calcium 9.4 8.9 - 10.3 mg/dL   Total Protein 7.9 6.5 - 8.1 g/dL   Albumin 4.4 3.5 - 5.0 g/dL   AST 35 15 - 41 U/L   ALT 36 14 - 54 U/L   Alkaline Phosphatase 86 38 - 126 U/L   Total Bilirubin 1.5 (H) 0.3 - 1.2 mg/dL   GFR calc non Af Amer 36 (L) >60 mL/min   GFR calc Af Amer 42 (L) >60 mL/min   Anion gap 6 5 - 15  Bilirubin, direct  Result Value Ref Range   Bilirubin, Direct 0.1 0.1 - 0.5 mg/dL      Assessment & Plan:   Problem List Items Addressed This Visit      Cardiovascular and Mediastinum   Mild atherosclerosis of carotid artery (Chronic)    Discussed results of last carotid scan; taking 81 mg BID; she agrees to checking carotid US this year; ordered      Relevant Orders   US Carotid Bilateral   Hypertension goal BP (blood pressure) < 150/90 (Chronic)    Well-controlled today        Endocrine   Hypothyroidism, adult    Last TSH in Jan was normal;  check once a year        Nervous and Auditory   Cervical radiculopathy due to degenerative joint disease of spine    Known arthritis in the neck; not limiting activities; will follow for now        Hematopoietic and Hemostatic   Essential thrombocytosis ([place]) (Chronic)    Managed by heme-onc locally        Other   Gilbert syndrome    Noted      Dyslipidemia    Reviewed last lipid panel; next due in July      Relevant Orders   Lipid panel       Follow up plan: Return in about 6 months (around 12/31/2016) for Medicare Wellness check.  An after-visit summary was printed and given to the patient at [place].  Please see the patient instructions which may contain other information and recommendations beyond what is mentioned above in the assessment and plan.  No orders of the defined types were placed in this encounter.   Orders Placed This Encounter  Procedures  . US Carotid Bilateral  . Lipid panel

## 2016-06-30 NOTE — Assessment & Plan Note (Signed)
Managed by heme-onc locally

## 2016-06-30 NOTE — Assessment & Plan Note (Signed)
Last TSH in Jan was normal; check once a year

## 2016-06-30 NOTE — Assessment & Plan Note (Signed)
Reviewed last lipid panel; next due in July

## 2016-06-30 NOTE — Patient Instructions (Addendum)
You can have my fasting labs done on or after July 6th Dr. Mike Gip wants labs around July 9th, so you can combine those We'll get the carotid scan  Carotid Artery Disease The carotid arteries are arteries on both sides of the neck. They carry blood to the brain. Carotid artery disease is when the arteries get smaller (narrow) or get blocked. If these arteries get smaller or get blocked, you are more likely to have a stroke or warning stroke (transient ischemic attack). Follow these instructions at home:  Take medicines as told by your doctor. Make sure you understand all your medicine instructions. Do not stop your medicines without talking to your doctor first.  Follow your doctor's diet instructions. It is important to eat a healthy diet that includes plenty of:  Fresh fruits.  Vegetables.  Lean meats.  Avoid:  High-fat foods.  High-sodium foods.  Foods that are fried, overly processed, or have poor nutritional value.  Stay a healthy weight.  Stay active. Get at least 30 minutes of activity every day.  Do not smoke.  Limit alcohol use to:  No more than 2 drinks a day for men.  No more than 1 drink a day for women who are not pregnant.  Do not use illegal drugs.  Keep all doctor visits as told. Get help right away if:  You have sudden weakness or loss of feeling (numbness) on one side of the body, such as the face, arm, or leg.  You have sudden confusion.  You have trouble speaking (aphasia) or understanding.  You have sudden trouble seeing out of one or both eyes.  You have sudden trouble walking.  You have dizziness or feel like you might pass out (faint).  You have a loss of balance or your movements are not steady (uncoordinated).  You have a sudden, severe headache with no known cause.  You have trouble swallowing (dysphagia). Call your local emergency services (911 in [place] Do notdrive yourself to the clinic or hospital. This information is not  intended to replace advice given to you by your health care provider. Make sure you discuss any questions you have with your health care provider. Document Released: 01/27/2012 Document Revised: 07/18/2015 Document Reviewed: 08/10/2012 Elsevier Interactive Patient Education  2017 [employer].

## 2016-06-30 NOTE — Assessment & Plan Note (Signed)
Noted  

## 2016-07-22 ENCOUNTER — Ambulatory Visit (INDEPENDENT_AMBULATORY_CARE_PROVIDER_SITE_OTHER): Payer: Medicare HMO | Admitting: Obstetrics & Gynecology

## 2016-07-22 ENCOUNTER — Encounter: Payer: Self-pay | Admitting: Obstetrics & Gynecology

## 2016-07-22 VITALS — BP 120/70 | HR 66 | Ht 64.0 in | Wt 146.0 lb

## 2016-07-22 DIAGNOSIS — N814 Uterovaginal prolapse, unspecified: Secondary | ICD-10-CM

## 2016-07-22 NOTE — Progress Notes (Signed)
HPI:      Ms. Myya Meenach is a 83 y.o. [phone] who presents today for her pessary follow up and examination related to her pelvic floor weakening.  Pt reports tolerating the pessary well with  no vaginal bleeding and  no vaginal discharge.  Symptoms of pelvic floor weakening have greatly improved. She is voiding and defecating without difficulty. She currently has a ring 3 pessary.  PMHx: She  has a past medical history of Breast mass in female; Cancer [facility]); Cataract; Dermatitis; Difficult intubation (03/03/2016); Diverticulitis; Diverticulosis (05/28/2015); Essential thrombocytosis ([place]) (07/23/2015); Genital prolapse; GERD (gastroesophageal reflux disease); Gilbert syndrome (06/30/2016); Headache; Hyperlipemia; Hyperlipidemia; Hypertension; Hypothyroidism; Hypothyroidism; Mild atherosclerosis of carotid artery (06/25/2015); MRI contraindicated due to metal implant; Prolapse of uterus; Radiculopathy; Renal cyst; Renal cyst; Rosacea; Shingles; Thrombocytosis ([place]); and Thyroid disease. Also,  has a past surgical history that includes Tonsillectomy; Appendectomy; Cesarean section; Joint replacement (Right); Thyroidectomy; Colon surgery; Cataract extraction w/PHACO (Left, 03/25/2015); Adenoidectomy; biopsy, right breast ; Total hip arthroplasty; and Thyroid lobectomy., family history includes Dementia in her maternal grandfather; Stroke in her father and mother.,  reports that she has quit smoking. She has never used smokeless tobacco. She reports that she does not drink alcohol or use drugs.  She has a current medication list which includes the following prescription(s): amlodipine, aspirin ec, atorvastatin, vitamin d3, hydroxyurea, irbesartan, ketoconazole, levothyroxine, and ranitidine. Also, is allergic to novocain [procaine].  Review of Systems  Constitutional: Negative for chills, fever and malaise/fatigue.  HENT: Negative for congestion, sinus pain and sore throat.   Eyes: Negative for blurred vision and  pain.  Respiratory: Negative for cough and wheezing.   Cardiovascular: Negative for chest pain and leg swelling.  Gastrointestinal: Negative for abdominal pain, constipation, diarrhea, heartburn, nausea and vomiting.  Genitourinary: Negative for dysuria, frequency, hematuria and urgency.  Musculoskeletal: Negative for back pain, joint pain, myalgias and neck pain.  Skin: Negative for itching and rash.  Neurological: Negative for dizziness, tremors and weakness.  Endo/Heme/Allergies: Does not bruise/bleed easily.  Psychiatric/Behavioral: Negative for depression. The patient is not nervous/anxious and does not have insomnia.     Objective: BP 120/70   Pulse 66   Ht 5\' 4"  (1.626 m)   Wt 146 lb (66.2 kg)   BMI 25.06 kg/m  Physical Exam  Constitutional: She is oriented to person, place, and time. She appears well-developed and well-nourished. No distress.  Genitourinary: Vagina normal and uterus normal. Pelvic exam was performed with patient supine. There is no rash, tenderness or lesion on the right labia. There is no rash, tenderness or lesion on the left labia. No erythema or bleeding in the vagina. Right adnexum does not display mass and does not display tenderness. Left adnexum does not display mass and does not display tenderness. Cervix does not exhibit motion tenderness, discharge, polyp or nabothian cyst.   Uterus is mobile and midaxial. Uterus is not enlarged or exhibiting a mass.  Abdominal: Soft. She exhibits no distension. There is no tenderness.  Musculoskeletal: Normal range of motion.  Neurological: She is alert and oriented to person, place, and time. No cranial nerve deficit.  Skin: Skin is warm and dry.  Psychiatric: She has a normal mood and affect.    Pessary Care Pessary removed and cleaned.  Vagina checked - without erosions - pessary replaced.  A/P:  Uterine Prolapse Pessary was cleaned and replaced today. Instructions given for care. Concerning symptoms to  observe for are counseled to patient. Follow up scheduled for 3 months.  Barnett Applebaum, MD, [facility], [facility] 07/22/2016  11:15 AM

## 2016-07-29 ENCOUNTER — Encounter: Payer: Self-pay | Admitting: Family Medicine

## 2016-07-29 ENCOUNTER — Ambulatory Visit (INDEPENDENT_AMBULATORY_CARE_PROVIDER_SITE_OTHER): Payer: Medicare HMO | Admitting: Family Medicine

## 2016-07-29 DIAGNOSIS — T884XXD Failed or difficult intubation, subsequent encounter: Secondary | ICD-10-CM

## 2016-07-29 DIAGNOSIS — Z6379 Other stressful life events affecting family and household: Secondary | ICD-10-CM | POA: Diagnosis not present

## 2016-07-29 DIAGNOSIS — K219 Gastro-esophageal reflux disease without esophagitis: Secondary | ICD-10-CM

## 2016-07-29 DIAGNOSIS — K649 Unspecified hemorrhoids: Secondary | ICD-10-CM

## 2016-07-29 DIAGNOSIS — I6523 Occlusion and stenosis of bilateral carotid arteries: Secondary | ICD-10-CM | POA: Diagnosis not present

## 2016-07-29 DIAGNOSIS — R69 Illness, unspecified: Secondary | ICD-10-CM | POA: Diagnosis not present

## 2016-07-29 DIAGNOSIS — K648 Other hemorrhoids: Secondary | ICD-10-CM

## 2016-07-29 HISTORY — DX: Unspecified hemorrhoids: K64.9

## 2016-07-29 MED ORDER — RANITIDINE HCL 150 MG PO TABS: 150.0000 mg | ORAL_TABLET | Freq: Two times a day (BID) | ORAL | 11 refills | Status: DC

## 2016-07-29 MED ORDER — SERTRALINE HCL 25 MG PO TABS: 25.0000 mg | ORAL_TABLET | Freq: Every day | ORAL | 11 refills | Status: DC

## 2016-07-29 MED ORDER — HYDROCORTISONE ACETATE 25 MG RE SUPP
25.0000 mg | Freq: Two times a day (BID) | RECTAL | 0 refills | Status: DC
Start: 2016-07-29 — End: 2016-09-01

## 2016-07-29 NOTE — Progress Notes (Signed)
BP 118/74   Pulse 75   Temp 97.8 F (36.6 C) (Oral)   Resp 14   Ht 5\' 4"  (1.626 m)   Wt 143 lb 6.4 oz (65 kg)   SpO2 96%   BMI 24.61 kg/m    Subjective:    Patient ID: Gina [name], female    DOB: July 31, 1926, 83 y.o.   MRN: [id number]  HPI: Gina [name] is a 83 y.o. female  Chief Complaint  Patient presents with  . GI Problem    Light gas and some constipation and sometimes feel like she has to vomit    HPI Patient has been under incredible stress and internalizes it; it hits her in the gut; lifetime thing, nothing new; as she gets older, she realized she had diarrhea on a Thurs night/Fri morning; did not go on the bathroom No cramps or bleeding Has some nausea; nothing new with her gut anxiety; going on for years and years; recalls even how bad her nausea was as a child and with pregnancy, even labor; the nausea and vomiting are nothing new Still having hemorrhoids; she saw Dr. Kenton Kingfisher and he said she has hemorrhoids; and he called in something for $55; my cream was $100; she has had a colonoscopy and an endoscopy, brought in copies of these today She would like a suppository; she has hemorrhoids, heaviness; no bleeding She says he is OCD; caring for her husband who has stage IV cancer Patient has not gone for the carotid scan yet; she has not heard back about that yet; staff checking on that now  She had a cystoscopy too Depression screen [facility] 2/9 07/29/2016 06/30/2016 12/31/2015 12/19/2014 10/05/2014  Decreased Interest 0 0 0 0 0  Down, Depressed, Hopeless 1 1 0 0 0  PHQ - 2 Score 1 1 0 0 0    Relevant past medical, surgical, family and social history reviewed Past Medical History:  Diagnosis Date  . Breast mass in female    right breast  . Cancer ([place])    SKIN  . Cataract   . Dermatitis   . Difficult intubation 03/03/2016   September 06, 2013; left nasal fiberoptic intubation #7 ETT; see letter from Dr. Loanne Drilling, Dept of Anest[facility]iology, [facility]  . Diverticulitis   .  Diverticulosis 05/28/2015  . Essential thrombocytosis ([place]) 07/23/2015  . Genital prolapse   . GERD (gastroesophageal reflux disease)   . 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 Mike Gip  . Headache   . Hyperlipemia   . Hyperlipidemia    history of   . Hypertension   . Hypothyroidism   . Hypothyroidism   . Mild atherosclerosis of carotid artery 06/25/2015  . MRI contraindicated due to metal implant   . Prolapse of uterus   . Radiculopathy   . Renal cyst   . Renal cyst   . Rosacea   . Shingles   . Thrombocytosis ([place])   . Thyroid disease    Past Surgical History:  Procedure Laterality Date  . ADENOIDECTOMY    . APPENDECTOMY    . biopsy, right breast     . CATARACT EXTRACTION W/PHACO Left 03/25/2015   Procedure: CATARACT EXTRACTION PHACO AND INTRAOCULAR LENS PLACEMENT (IOC);  Surgeon: Estill Cotta, MD;  Location: [facility] ORS;  Service: Ophthalmology;  Laterality: Left;  [place] 02:09 AP% 27.3 CDE 59.97 fluid pack lot #[id number]  . CESAREAN SECTION    . COLON SURGERY    . JOINT REPLACEMENT Right    total hip  .  THYROID LOBECTOMY    . THYROIDECTOMY     partial  . TONSILLECTOMY    . TOTAL HIP ARTHROPLASTY    . UPPER GI ENDOSCOPY  10/17/2008   Family History  Problem Relation Age of Onset  . Stroke Mother   . Stroke Father   . Dementia Maternal Grandfather    Social History   Social History  . Marital status: Married    Spouse name: N/A  . Number of children: N/A  . Years of education: N/A   Occupational History  . Not on file.   Social History Main Topics  . Smoking status: Former Research scientist (life sciences)  . Smokeless tobacco: Never Used  . Alcohol use No  . Drug use: No  . Sexual activity: No   Other Topics Concern  . Not on file   Social History Narrative  . No narrative on file   Interim medical history since last visit reviewed. Allergies and medications reviewed  Review of Systems Per HPI unless specifically indicated above     Objective:    BP 118/74    Pulse 75   Temp 97.8 F (36.6 C) (Oral)   Resp 14   Ht 5\' 4"  (1.626 m)   Wt 143 lb 6.4 oz (65 kg)   SpO2 96%   BMI 24.61 kg/m   Wt Readings from Last 3 Encounters:  07/29/16 143 lb 6.4 oz (65 kg)  07/22/16 146 lb (66.2 kg)  06/30/16 146 lb (66.2 kg)    Physical Exam  Constitutional: She appears well-developed and well-nourished.  HENT:  Mouth/Throat: Mucous membranes are normal.  Eyes: EOM are normal. No scleral icterus.  Cardiovascular: Normal rate and regular rhythm.   Pulmonary/Chest: Effort normal and breath sounds normal.  Genitourinary: Rectal exam shows external hemorrhoid. Rectal exam shows no fissure, no mass, no tenderness, anal tone normal and guaiac negative stool.  Psychiatric: She has a normal mood and affect. Her behavior is normal.      Assessment & Plan:   Problem List Items Addressed This Visit      Cardiovascular and Mediastinum   Mild atherosclerosis of carotid artery (Chronic)    Checked with staff on the carotid US order I placed on May 8th; they will get this scheduled for her; we discussed lack of data on patients her age and statins with this condition; she opted to stop the statin and recheck her lipids in July, since the statin is upsetting her stomach      Hemorrhoids    No red flags; no bleeding; will try suppositories BID for 6-12 days; offered referral to surgeon for treatment; she will see how the suppositories work; avoid straining; hydration, fiber important; okay to use miralax regularly        Digestive   GERD without esophagitis    Discussed risks of PPIs long-term; she'll try H2 blocker      Relevant Medications   ranitidine (ZANTAC) 150 MG tablet     Other   Stress due to illness of family member    Husband has stage IV cancer; discussed starting low-dose SSRI as there are more serotonin receptors in the gut than the brain; she may be manifesting her stress in her bowel; she agrees; call me with update in 3-4 weeks       Difficult intubation    Updated in chart, reviewed letter from [facility] with her          Follow up plan: No Follow-up on file.  An after-visit  summary was printed and given to the patient at [place].  Please see the patient instructions which may contain other information and recommendations beyond what is mentioned above in the assessment and plan.  Meds ordered this encounter  Medications  . hydrocortisone (ANUSOL-HC) 25 MG suppository    Sig: Place 1 suppository (25 mg total) rectally 2 (two) times daily.    Dispense:  24 suppository    Refill:  0  . ranitidine (ZANTAC) 150 MG tablet    Sig: Take 1 tablet (150 mg total) by mouth 2 (two) times daily. As needed for heartburn, reflux    Dispense:  60 tablet    Refill:  11  . sertraline (ZOLOFT) 25 MG tablet    Sig: Take 1 tablet (25 mg total) by mouth daily.    Dispense:  30 tablet    Refill:  11    No orders of the defined types were placed in this encounter.

## 2016-07-29 NOTE — Assessment & Plan Note (Signed)
Updated in chart, reviewed letter from [facility] with her

## 2016-07-29 NOTE — Assessment & Plan Note (Signed)
Discussed risks of PPIs long-term; she'll try H2 blocker

## 2016-07-29 NOTE — Assessment & Plan Note (Signed)
No red flags; no bleeding; will try suppositories BID for 6-12 days; offered referral to surgeon for treatment; she will see how the suppositories work; avoid straining; hydration, fiber important; okay to use miralax regularly

## 2016-07-29 NOTE — Patient Instructions (Addendum)
Start the low dose sertraline (Zoloft) Let me know how you're doing in 3-4 weeks on this medicine Use the ranitidine (Zantac) for hearburn / reflux Caution: prolonged use of proton pump inhibitors like omeprazole (Prilosec), pantoprazole (Protonix), esomeprazole (Nexium), and others like Dexilant and Aciphex may increase your risk of pneumonia, Clostridium difficile colitis, osteoporosis, anemia and other health complications Try to limit or avoid triggers like coffee, caffeinated beverages, onions, chocolate, spicy foods, peppermint, acid foods like pizza, spaghetti sauce, and orange juice Lose weight if you are overweight or obese Try elevating the head of your bed by placing a small wedge between your mattress and box springs to keep acid in the stomach at night instead of coming up into your esophagus Return for fasting labs in July (after the 6th) Try to limit saturated fats in your diet (bologna, hot dogs, barbeque, cheeseburgers, hamburgers, steak, bacon, sausage, cheese, etc.) and get more fresh fruits, vegetables, and whole grains  Hemorrhoids Hemorrhoids are swollen veins in and around the rectum or anus. There are two types of hemorrhoids:  Internal hemorrhoids. These occur in the veins that are just inside the rectum. They may poke through to the outside and become irritated and painful.  External hemorrhoids. These occur in the veins that are outside of the anus and can be felt as a painful swelling or hard lump near the anus.  Most hemorrhoids do not cause serious problems, and they can be managed with home treatments such as diet and lifestyle changes. If home treatments do not help your symptoms, procedures can be done to shrink or remove the hemorrhoids. What are the causes? This condition is caused by increased pressure in the anal area. This pressure may result from various things, including:  Constipation.  Straining to have a bowel  movement.  Diarrhea.  Pregnancy.  Obesity.  Sitting for long periods of time.  Heavy lifting or other activity that causes you to strain.  Anal sex.  What are the signs or symptoms? Symptoms of this condition include:  Pain.  Anal itching or irritation.  Rectal bleeding.  Leakage of stool (feces).  Anal swelling.  One or more lumps around the anus.  How is this diagnosed? This condition can often be diagnosed through a visual exam. Other exams or tests may also be done, such as:  Examination of the rectal area with a gloved hand (digital rectal exam).  Examination of the anal canal using a small tube (anoscope).  A blood test, if you have lost a significant amount of blood.  A test to look inside the colon (sigmoidoscopy or colonoscopy).  How is this treated? This condition can usually be treated at home. However, various procedures may be done if dietary changes, lifestyle changes, and other home treatments do not help your symptoms. These procedures can help make the hemorrhoids smaller or remove them completely. Some of these procedures involve surgery, and others do not. Common procedures include:  Rubber band ligation. Rubber bands are placed at the base of the hemorrhoids to cut off the blood supply to them.  Sclerotherapy. Medicine is injected into the hemorrhoids to shrink them.  Infrared coagulation. A type of light energy is used to get rid of the hemorrhoids.  Hemorrhoidectomy surgery. The hemorrhoids are surgically removed, and the veins that supply them are tied off.  Stapled hemorrhoidopexy surgery. A circular stapling device is used to remove the hemorrhoids and use staples to cut off the blood supply to them.  Follow these instructions  at home: Eating and drinking  Eat foods that have a lot of fiber in them, such as whole grains, beans, nuts, fruits, and vegetables. Ask your health care provider about taking products that have added fiber (fiber  supplements).  Drink enough fluid to keep your urine clear or pale yellow. Managing pain and swelling  Take warm sitz baths for 20 minutes, 3-4 times a day to ease pain and discomfort.  If directed, apply ice to the affected area. Using ice packs between sitz baths may be helpful. ? Put ice in a plastic bag. ? Place a towel between your skin and the bag. ? Leave the ice on for 20 minutes, 2-3 times a day. General instructions  Take over-the-counter and prescription medicines only as told by your health care provider.  Use medicated creams or suppositories as told.  Exercise regularly.  Go to the bathroom when you have the urge to have a bowel movement. Do not wait.  Avoid straining to have bowel movements.  Keep the anal area dry and clean. Use wet toilet paper or moist towelettes after a bowel movement.  Do not sit on the toilet for long periods of time. This increases blood pooling and pain. Contact a health care provider if:  You have increasing pain and swelling that are not controlled by treatment or medicine.  You have uncontrolled bleeding.  You have difficulty having a bowel movement, or you are unable to have a bowel movement.  You have pain or inflammation outside the area of the hemorrhoids. This information is not intended to replace advice given to you by your health care provider. Make sure you discuss any questions you have with your health care provider. Document Released: 02/07/2000 Document Revised: 07/10/2015 Document Reviewed: 10/24/2014 Elsevier Interactive Patient Education  2017 [employer].

## 2016-07-29 NOTE — Assessment & Plan Note (Signed)
Husband has stage IV cancer; discussed starting low-dose SSRI as there are more serotonin receptors in the gut than the brain; she may be manifesting her stress in her bowel; she agrees; call me with update in 3-4 weeks

## 2016-07-29 NOTE — Assessment & Plan Note (Signed)
Checked with staff on the carotid US order I placed on May 8th; they will get this scheduled for her; we discussed lack of data on patients her age and statins with this condition; she opted to stop the statin and recheck her lipids in July, since the statin is upsetting her stomach

## 2016-07-30 ENCOUNTER — Telehealth: Payer: Self-pay

## 2016-07-30 NOTE — Telephone Encounter (Signed)
Pt.notified

## 2016-07-30 NOTE — Telephone Encounter (Signed)
Pt states the cost of the Anusol is $180. Please advise.

## 2016-07-30 NOTE — Telephone Encounter (Signed)
Please contact her pharmacist and find out what is the cheapest way for her to have corticosteroid for hemorrhoids; cream vs suppository versus some other way; thank you

## 2016-08-03 ENCOUNTER — Ambulatory Visit: Payer: Medicare HMO

## 2016-08-03 ENCOUNTER — Ambulatory Visit
Admission: RE | Admit: 2016-08-03 | Discharge: 2016-08-03 | Disposition: A | Discharge status: Home / Self Care | Payer: Medicare HMO | Source: Ambulatory Visit | Attending: Family Medicine | Admitting: Family Medicine

## 2016-08-03 DIAGNOSIS — I6523 Occlusion and stenosis of bilateral carotid arteries: Secondary | ICD-10-CM | POA: Diagnosis not present

## 2016-08-04 DIAGNOSIS — R69 Illness, unspecified: Secondary | ICD-10-CM | POA: Diagnosis not present

## 2016-08-25 ENCOUNTER — Encounter: Payer: Self-pay | Admitting: Family Medicine

## 2016-08-25 ENCOUNTER — Ambulatory Visit (INDEPENDENT_AMBULATORY_CARE_PROVIDER_SITE_OTHER): Payer: Medicare HMO | Admitting: Family Medicine

## 2016-08-25 VITALS — BP 126/84 | HR 85 | Temp 98.4°F | Resp 16 | Ht 64.0 in | Wt 142.0 lb

## 2016-08-25 DIAGNOSIS — R69 Illness, unspecified: Secondary | ICD-10-CM | POA: Diagnosis not present

## 2016-08-25 DIAGNOSIS — R110 Nausea: Secondary | ICD-10-CM | POA: Diagnosis not present

## 2016-08-25 DIAGNOSIS — K219 Gastro-esophageal reflux disease without esophagitis: Secondary | ICD-10-CM | POA: Diagnosis not present

## 2016-08-25 DIAGNOSIS — Z6379 Other stressful life events affecting family and household: Secondary | ICD-10-CM

## 2016-08-25 MED ORDER — PANTOPRAZOLE SODIUM 20 MG PO TBEC: 20.0000 mg | DELAYED_RELEASE_TABLET | Freq: Every day | ORAL | 0 refills | Status: DC

## 2016-08-25 NOTE — Patient Instructions (Addendum)

## 2016-08-25 NOTE — Progress Notes (Addendum)
Name: Gina [name]   MRN: [id number]    DOB: 12-Oct-1926   Date:08/25/2016       Progress Note  Subjective  Chief Complaint  Chief Complaint  Patient presents with  . Gastroesophageal Reflux    zantac not working    HPI  Pt presents with complaint of constant nausea for one week - she reacts to stress with nausea. Has had GI work-up in the past for nausea and GERD, but she has rarely had nausea that remains constant like this. She has had dry-heaving, no vomiting but having bitter taste in mouth. Was given pantoprazole by the GI physician in the past but was afraid to take it because of the long-term effects. Last visit with GI was January 2017.  Follows strict GERD diet, taking Zantac OTC BID with some relief of heartburn but not the nausea.   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 She does have someone caring for the house and laundry but she is still a little overwhelmed.  Her children are now helping her too - two sons live locally and her daughter lives in [place] but is coming to visit to help.    I reviewed laboratory results, imaging including a January 2018 RUQ [place] that was WNL, and past GI and PCP notes during this visit.  Patient Active Problem List   Diagnosis Date Noted  . Hemorrhoids 07/29/2016  . Stress due to illness of family member 07/29/2016  . Gilbert syndrome 06/30/2016  . Difficult intubation 03/03/2016  . Need for SBE (subacute bacterial endocarditis) prophylaxis 10/16/2015  . Essential thrombocytosis ([place]) 07/23/2015  . Mild atherosclerosis of carotid artery 06/25/2015  . Dizziness 06/03/2015  . Dyslipidemia 06/03/2015  . Medication monitoring encounter 05/28/2015  . Thrombocytosis ([place]) 05/28/2015  . Diverticulosis 05/28/2015  . GERD without esophagitis 02/06/2015  . Cramps of lower extremity 01/31/2015  . Neuropathy 01/31/2015  . Cervical nerve root disorder 01/31/2015   . CKD (chronic kidney disease), stage III 10/06/2014  . Renal cyst, acquired, right 10/06/2014  . Uterine prolapse 10/06/2014  . Cervical radiculopathy due to degenerative joint disease of spine 10/06/2014  . History of right hip replacement 10/06/2014  . Pneumococcal vaccination declined by patient 10/06/2014  . Hypothyroidism, adult 10/05/2014  . Hypertension goal BP (blood pressure) < 150/90 09/13/2014    Social History  Substance Use Topics  . Smoking status: Former Research scientist (life sciences)  . Smokeless tobacco: Never Used  . Alcohol use No     Current Outpatient Prescriptions:  .  amLODipine (NORVASC) 5 MG tablet, Take 1 tablet (5 mg total) by mouth daily., Disp: 90 tablet, Rfl: 3 .  aspirin EC 81 MG tablet, Take 81 mg by mouth 2 (two) times daily. , Disp: , Rfl:  .  Cholecalciferol (VITAMIN D3) 1000 units CAPS, Take by mouth daily. , Disp: , Rfl:  .  hydrocortisone (ANUSOL-HC) 25 MG suppository, Place 1 suppository (25 mg total) rectally 2 (two) times daily., Disp: 24 suppository, Rfl: 0 .  irbesartan (AVAPRO) 150 MG tablet, Take 1 tablet (150 mg total) by mouth daily., Disp: 90 tablet, Rfl: 5 .  ketoconazole (NIZORAL) 2 % shampoo, Apply 1 application topically 2 (two) times a week. , Disp: , Rfl:  .  levothyroxine (SYNTHROID, LEVOTHROID) 88 MCG tablet, Take 1 tablet (88 mcg total) by mouth daily before breakfast., Disp: 90 tablet, Rfl: 3 .  ranitidine (ZANTAC) 150 MG  tablet, Take 1 tablet (150 mg total) by mouth 2 (two) times daily. As needed for heartburn, reflux, Disp: 60 tablet, Rfl: 11 .  sertraline (ZOLOFT) 25 MG tablet, Take 1 tablet (25 mg total) by mouth daily., Disp: 30 tablet, Rfl: 11 .  hydroxyurea (HYDREA) 500 MG capsule, TAKE ONE CAPSULE BY MOUTH ON MONDAY, WEDNESDAY AND FRIDAY. MAY TAKE WITH FOOD TO MINIMIZE GI SIDE EFFECTS. (Patient not taking: Reported on 08/25/2016), Disp: 30 capsule, Rfl: 3 .  pantoprazole (PROTONIX) 20 MG tablet, Take 1 tablet (20 mg total) by mouth daily., Disp:  30 tablet, Rfl: 0  Allergies  Allergen Reactions  . Novocain [Procaine] Palpitations    ROS Constitutional: Negative for fever or weight change.  Respiratory: Negative for cough and shortness of breath.   Cardiovascular: Negative for chest pain or palpitations.  Gastrointestinal: Negative for abdominal pain, no bowel changes; +hemorroids, ongoing. + Nausea - See HPI Musculoskeletal: Negative for gait problem or joint swelling.  Skin: Negative for rash.  Neurological: Negative for dizziness or headache.  No other specific complaints in a complete review of systems (except as listed in HPI above).  Objective  Vitals:   08/25/16 1050  BP: 126/84  Pulse: 85  Resp: 16  Temp: 98.4 F (36.9 C)  TempSrc: Oral  SpO2: 96%  Weight: 142 lb (64.4 kg)  Height: _0  (1.626 m)   Body mass index is 24.37 kg/m.  Nursing Note and Vital Signs reviewed.  Physical Exam  Constitutional: Patient appears well-developed and well-nourished. Very well appearing. No distress.  HEENT: head atraumatic, normocephalic OP is pink and moist. Cardiovascular: Normal rate, regular rhythm, S1/S2 present.  No murmur or rub heard. No BLE edema. Pulmonary/Chest: Effort normal and breath sounds clear. No respiratory distress or retractions. Abdominal: Soft and non-tender, bowel sounds present x4 quadrants.  No CVA Tenderness. Psychiatric: Patient has a normal mood and affect. behavior is normal. Judgment and thought content normal.  Recent Results (from the past 2160 hour(s))  CBC with Differential     Status: Abnormal   Collection Time: 06/01/16 11:45 AM  Result Value Ref Range   WBC 8.0 3.6 - 11.0 K/uL   RBC 4.46 3.80 - 5.20 MIL/uL   Hemoglobin 14.0 12.0 - 16.0 g/dL   HCT 41.1 35.0 - 47.0 %   MCV 92.1 80.0 - 100.0 fL   MCH 31.5 26.0 - 34.0 pg   MCHC 34.2 32.0 - 36.0 g/dL   RDW 15.0 (H) 11.5 - 14.5 %   Platelets 378 150 - 440 K/uL   Neutrophils Relative % 67 %   Neutro Abs 5.4 1.4 - 6.5 K/uL    Lymphocytes Relative 19 %   Lymphs Abs 1.5 1.0 - 3.6 K/uL   Monocytes Relative 9 %   Monocytes Absolute 0.7 0.2 - 0.9 K/uL   Eosinophils Relative 4 %   Eosinophils Absolute 0.3 0 - 0.7 K/uL   Basophils Relative 1 %   Basophils Absolute 0.1 0 - 0.1 K/uL  Comprehensive metabolic panel     Status: Abnormal   Collection Time: 06/01/16 11:45 AM  Result Value Ref Range   Sodium 135 135 - 145 mmol/L   Potassium 4.6 3.5 - 5.1 mmol/L   Chloride 103 101 - 111 mmol/L   CO2 26 22 - 32 mmol/L   Glucose, Bld 101 (H) 65 - 99 mg/dL   BUN 31 (H) 6 - 20 mg/dL   Creatinine, Ser 1.29 (H) 0.44 - 1.00 mg/dL   Calcium 9.4  8.9 - 10.3 mg/dL   Total Protein 7.9 6.5 - 8.1 g/dL   Albumin 4.4 3.5 - 5.0 g/dL   AST 35 15 - 41 U/L   ALT 36 14 - 54 U/L   Alkaline Phosphatase 86 38 - 126 U/L   Total Bilirubin 1.5 (H) 0.3 - 1.2 mg/dL   GFR calc non Af Amer 36 (L) >60 mL/min   GFR calc Af Amer 42 (L) >60 mL/min    Comment: (NOTE) The eGFR has been calculated using the CKD EPI equation. This calculation has not been validated in all clinical situations. eGFR's persistently <60 mL/min signify possible Chronic Kidney Disease.    Anion gap 6 5 - 15  Bilirubin, direct     Status: None   Collection Time: 06/01/16 11:49 AM  Result Value Ref Range   Bilirubin, Direct 0.1 0.1 - 0.5 mg/dL     Assessment & Plan  1. Nausea - H. pylori breath test - Ambulatory referral to Gastroenterology - Advised that due to her age, increased risk of falls, and increased risk of ECG changes, nausea medication is not recommended at this time.  2. GERD without esophagitis - pantoprazole (PROTONIX) 20 MG tablet; Take 1 tablet (20 mg total) by mouth daily.  Dispense: 30 tablet; Refill: 0 - Advised that she should do a 30-day trial of a PPI, she does not want to do a large dose, so we will start her out at 52m, and she will follow up with her PCP Dr. LSanda Kleinin 1 week to consider increasing the dose depending on how she is doing. -  Ambulatory referral to Gastroenterology  3. Stress due to illness of family member Discussed at length per HPI. She is getting more help from family and external sources.  -Red flags and when to present for emergency care or RTC including fever >101.75F, chest pain, shortness of breath, new/worsening/un-resolving symptoms, vomiting or abdominal pain, reviewed with patient at time of visit. Follow up and care instructions discussed and provided in AVS.  I have reviewed this encounter including the documentation in this note and/or discussed this patient with the pJohney Maine FNP, NP-C. I am certifying that I agree with the content of this note as supervising physician.  KSteele Sizer MD [facility] 08/30/2016, 9:31 PM

## 2016-08-26 LAB — H. PYLORI BREATH TEST: H. PYLORI BREATH TEST: NOT DETECTED

## 2016-08-26 NOTE — Progress Notes (Signed)
Negative H. Pylori. Please have patient keep upcoming appointment with Dr. Sanda Klein and await call regarding new GI referral. Thank you!

## 2016-08-27 ENCOUNTER — Other Ambulatory Visit: Payer: Self-pay | Admitting: Family Medicine

## 2016-08-27 DIAGNOSIS — E039 Hypothyroidism, unspecified: Secondary | ICD-10-CM | POA: Diagnosis not present

## 2016-08-27 DIAGNOSIS — E785 Hyperlipidemia, unspecified: Secondary | ICD-10-CM | POA: Diagnosis not present

## 2016-08-28 LAB — LIPID PANEL
CHOLESTEROL: 237 mg/dL — AB (ref <200)
HDL: 51 mg/dL (ref >50)
LDL Cholesterol: 155 mg/dL — ABNORMAL HIGH (ref <100)
TRIGLYCERIDES: 154 mg/dL — AB (ref <150)
Total CHOL/HDL Ratio: 4.6 Ratio (ref <5.0)
VLDL: 31 mg/dL — ABNORMAL HIGH (ref <30)

## 2016-08-28 LAB — TSH: TSH: 2 m[IU]/L

## 2016-08-31 ENCOUNTER — Inpatient Hospital Stay (HOSPITAL_BASED_OUTPATIENT_CLINIC_OR_DEPARTMENT_OTHER): Payer: Medicare HMO | Admitting: Hematology and Oncology

## 2016-08-31 ENCOUNTER — Inpatient Hospital Stay: Payer: Medicare HMO | Attending: Hematology and Oncology

## 2016-08-31 ENCOUNTER — Encounter: Payer: Self-pay | Admitting: Hematology and Oncology

## 2016-08-31 VITALS — BP 136/71 | HR 71 | Temp 96.4°F | Resp 18 | Wt 140.5 lb

## 2016-08-31 DIAGNOSIS — N2889 Other specified disorders of kidney and ureter: Secondary | ICD-10-CM | POA: Diagnosis not present

## 2016-08-31 DIAGNOSIS — D473 Essential (hemorrhagic) thrombocythemia: Secondary | ICD-10-CM

## 2016-08-31 DIAGNOSIS — R17 Unspecified jaundice: Secondary | ICD-10-CM

## 2016-08-31 DIAGNOSIS — E785 Hyperlipidemia, unspecified: Secondary | ICD-10-CM | POA: Insufficient documentation

## 2016-08-31 DIAGNOSIS — R69 Illness, unspecified: Secondary | ICD-10-CM | POA: Diagnosis not present

## 2016-08-31 DIAGNOSIS — Z85828 Personal history of other malignant neoplasm of skin: Secondary | ICD-10-CM | POA: Insufficient documentation

## 2016-08-31 DIAGNOSIS — Z79899 Other long term (current) drug therapy: Secondary | ICD-10-CM | POA: Diagnosis not present

## 2016-08-31 DIAGNOSIS — K219 Gastro-esophageal reflux disease without esophagitis: Secondary | ICD-10-CM

## 2016-08-31 DIAGNOSIS — N63 Unspecified lump in unspecified breast: Secondary | ICD-10-CM

## 2016-08-31 DIAGNOSIS — N281 Cyst of kidney, acquired: Secondary | ICD-10-CM | POA: Insufficient documentation

## 2016-08-31 DIAGNOSIS — Z87891 Personal history of nicotine dependence: Secondary | ICD-10-CM

## 2016-08-31 DIAGNOSIS — F419 Anxiety disorder, unspecified: Secondary | ICD-10-CM | POA: Insufficient documentation

## 2016-08-31 DIAGNOSIS — R110 Nausea: Secondary | ICD-10-CM

## 2016-08-31 DIAGNOSIS — Z7982 Long term (current) use of aspirin: Secondary | ICD-10-CM | POA: Diagnosis not present

## 2016-08-31 DIAGNOSIS — M5410 Radiculopathy, site unspecified: Secondary | ICD-10-CM

## 2016-08-31 DIAGNOSIS — N819 Female genital prolapse, unspecified: Secondary | ICD-10-CM

## 2016-08-31 DIAGNOSIS — I700 Atherosclerosis of aorta: Secondary | ICD-10-CM | POA: Insufficient documentation

## 2016-08-31 DIAGNOSIS — Z8719 Personal history of other diseases of the digestive system: Secondary | ICD-10-CM | POA: Insufficient documentation

## 2016-08-31 DIAGNOSIS — E039 Hypothyroidism, unspecified: Secondary | ICD-10-CM | POA: Insufficient documentation

## 2016-08-31 DIAGNOSIS — I10 Essential (primary) hypertension: Secondary | ICD-10-CM

## 2016-08-31 LAB — CBC WITH DIFFERENTIAL/PLATELET
Basophils Absolute: 0.1 10*3/uL (ref 0–0.1)
Basophils Relative: 1 %
Eosinophils Absolute: 0.2 10*3/uL (ref 0–0.7)
Eosinophils Relative: 3 %
HCT: 38.8 % (ref 35.0–47.0)
Hemoglobin: 13.5 g/dL (ref 12.0–16.0)
Lymphocytes Relative: 20 %
Lymphs Abs: 1.4 10*3/uL (ref 1.0–3.6)
MCH: 32 pg (ref 26.0–34.0)
MCHC: 34.9 g/dL (ref 32.0–36.0)
MCV: 91.7 fL (ref 80.0–100.0)
Monocytes Absolute: 0.7 10*3/uL (ref 0.2–0.9)
Monocytes Relative: 10 %
Neutro Abs: 4.7 10*3/uL (ref 1.4–6.5)
Neutrophils Relative %: 66 %
Platelets: 409 10*3/uL (ref 150–440)
RBC: 4.23 MIL/uL (ref 3.80–5.20)
RDW: 14.1 % (ref 11.5–14.5)
WBC: 7 10*3/uL (ref 3.6–11.0)

## 2016-08-31 LAB — COMPREHENSIVE METABOLIC PANEL
ALT: 16 U/L (ref 14–54)
AST: 20 U/L (ref 15–41)
Albumin: 4.4 g/dL (ref 3.5–5.0)
Alkaline Phosphatase: 66 U/L (ref 38–126)
Anion gap: 6 (ref 5–15)
BUN: 31 mg/dL — ABNORMAL HIGH (ref 6–20)
CO2: 24 mmol/L (ref 22–32)
Calcium: 9.5 mg/dL (ref 8.9–10.3)
Chloride: 103 mmol/L (ref 101–111)
Creatinine, Ser: 1.41 mg/dL — ABNORMAL HIGH (ref 0.44–1.00)
GFR calc Af Amer: 37 mL/min — ABNORMAL LOW (ref >60)
GFR calc non Af Amer: 32 mL/min — ABNORMAL LOW (ref >60)
Glucose, Bld: 124 mg/dL — ABNORMAL HIGH (ref 65–99)
Potassium: 4.9 mmol/L (ref 3.5–5.1)
Sodium: 133 mmol/L — ABNORMAL LOW (ref 135–145)
Total Bilirubin: 1.1 mg/dL (ref 0.3–1.2)
Total Protein: 7.5 g/dL (ref 6.5–8.1)

## 2016-08-31 NOTE — Progress Notes (Signed)
Patient recently had a flare of GERD and stopped her hydrea for the past 2 weeks.  She was started on Protonix by her GI doctor.

## 2016-08-31 NOTE — Progress Notes (Signed)
[facility] day:  08/31/2016  Chief Complaint: Gina [name] is a 83 y.o. female with essential thrombocythemia (ET) who is seen for 3 month assessment.  HPI:  The patient was last seen in the medical oncology clinic on 06/01/2016.  At that time, she was seen for initial assessment by me.  She had intermittent calf cramps.  Erythromelalgia symptoms were fading.  Exam was unremarkable.  CBC revealed a hematocrit of 41.1, hemoglobin 14.0, MCV 92.1, platelets 378,000, WBC 8,000 and ANC 5400.  Bilirubin was slightly elevated (1.5).  Direct bilirubin was 0.1.  She was to continue hydroxyurea 500 mg every Monday, Wednesday, and Friday.  She was to continue aspirin 81 mg po BID.  During the interim, patient states "I am pretty terrible". Patient reports that she has not taken her hydroxyurea in 2 weeks due to abdominal discomfort; nausea, abdominal tightness, and increased GERD symptoms. She has been seeing Dr. Sanda Klein, who started her on Protonix 64m daily. Patient reports that she has been on PPI therapy for "about 2 weeks".  Additionally, patient reporting increased anxiety. She is caring for her elderly husband who has an advanced cancer diagnosis.    Past Medical History:  Diagnosis Date  . Breast mass in female    right breast  . [place]    SKIN  . Cataract   . Dermatitis   . Difficult intubation 03/03/2016   September 06, 2013; left nasal fiberoptic intubation #7 ETT; see letter [facility] Dr[facility]anne Drilling [facility] . Diverticulitis   . Diverticulosis 05/28/2015  . Essential thrombocytosis ([place] 07/23/2015  . Genital prolapse   . GERD (gastroesophageal reflux disease)   . 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 CMike Gip . Headache   . Hyperlipemia   . Hyperlipidemia    history of   . Hypertension   . Hypothyroidism   . Hypothyroidism   . Mild atherosclerosis of carotid artery 06/25/2015  . MRI contraindicated due to metal  implant   . Prolapse of uterus   . Radiculopathy   . Renal cyst   . Renal cyst   . Rosacea   . Shingles   . Thrombocytosis ([place]   . Thyroid disease     Past Surgical History:  Procedure Laterality Date  . ADENOIDECTOMY    . APPENDECTOMY    . biopsy, right breast     . CATARACT EXTRACTION W/PHACO Left 03/25/2015   Procedure: CATARACT EXTRACTION PHACO AND INTRAOCULAR LENS PLACEMENT (IOC);  Surgeon: SEstill Cotta MD;  Location: [facility] ORS;  Service: Ophthalmology;  Laterality: Left;  [place]02:09 AP% 27.3 CDE 59.97 fluid pack lot [id number]  . CESAREAN SECTION    . COLON SURGERY    . JOINT REPLACEMENT Right    total hip  . THYROID LOBECTOMY    . THYROIDECTOMY     partial  . TONSILLECTOMY    . TOTAL HIP ARTHROPLASTY    . UPPER GI ENDOSCOPY  10/17/2008    Family History  Problem Relation Age of Onset  . Stroke Mother   . Stroke Father   . Dementia Maternal Grandfather     Social History:  reports that she has quit smoking. She has never used smokeless tobacco. She reports that she does not drink alcohol or use drugs.  [place]  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  She has rolled bandages.  She will be 89 in 2 weeks.  She has 8 children (3 + 5 between she and her husband).  Her husband is 35.  He has stage IV prostate cancer and is treated by Dr. Grayland Ormond.  The patient is alone today.  Allergies:  Allergies  Allergen Reactions  . Novocain [Procaine] Palpitations    Current Medications: Current Outpatient Prescriptions  Medication Sig Dispense Refill  . amLODipine (NORVASC) 5 MG tablet Take 1 tablet (5 mg total) by mouth daily. 90 tablet 3  . aspirin EC 81 MG tablet Take 81 mg by mouth 2 (two) times daily.     . Cholecalciferol (VITAMIN D3) 1000 units CAPS Take by mouth daily.     . hydrocortisone (ANUSOL-HC) 25 MG suppository Place 1 suppository  (25 mg total) rectally 2 (two) times daily. 24 suppository 0  . irbesartan (AVAPRO) 150 MG tablet Take 1 tablet (150 mg total) by mouth daily. 90 tablet 5  . ketoconazole (NIZORAL) 2 % shampoo Apply 1 application topically 2 (two) times a week.     . levothyroxine (SYNTHROID, LEVOTHROID) 88 MCG tablet Take 1 tablet (88 mcg total) by mouth daily before breakfast. 90 tablet 3  . pantoprazole (PROTONIX) 20 MG tablet Take 1 tablet (20 mg total) by mouth daily. 30 tablet 0  . ranitidine (ZANTAC) 150 MG tablet Take 1 tablet (150 mg total) by mouth 2 (two) times daily. As needed for heartburn, reflux 60 tablet 11  . sertraline (ZOLOFT) 25 MG tablet Take 1 tablet (25 mg total) by mouth daily. 30 tablet 11  . hydroxyurea (HYDREA) 500 MG capsule TAKE ONE CAPSULE BY MOUTH ON MONDAY, WEDNESDAY AND FRIDAY. MAY TAKE WITH FOOD TO MINIMIZE GI SIDE EFFECTS. (Patient not taking: Reported on 08/25/2016) 30 capsule 3   No current facility-administered medications for this visit.     Review of Systems:  GENERAL:  Fatigue.  No fevers or sweats.  Weight loss of 7 pounds. PERFORMANCE STATUS (ECOG):  1 HEENT:  No visual changes, runny nose, sore throat, mouth sores or tenderness. Lungs: No shortness of breath or cough.  No hemoptysis. Cardiac:  No chest pain, palpitations, orthopnea, or PND. GI:   Nausea, abdominal tightness, and increased GERD symptoms.  No diarrhea, constipation, melena or hematochezia. GU:  No urgency, frequency, dysuria, or hematuria. Musculoskeletal:  Charlie horse in calves, intermittent.  No back pain.  No joint pain.  No muscle tenderness. Extremities:  No pain or swelling. Skin:  No rashes or skin changes. Neuro:  No headache, numbness or weakness, balance or coordination issues. Endocrine:  No diabetes.  Thyroid issues disease on Synthroid.  No hot flashes or night sweats. Psych:  No mood changes, depression or anxiety. Pain:  No focal pain. Review of systems:  All other systems reviewed  and found to be negative.  Physical Exam: Blood pressure 136/71, pulse 71, temperature (!) 96.4 F (35.8 C), temperature source Tympanic, resp. rate 18, weight 140 lb 8 oz (63.7 kg). GENERAL:  Well developed, well nourished, woman sitting comfortably in the exam room in no acute distress. MENTAL STATUS:  Alert and oriented to person, place and time. HEAD:  Short gray hair.  Normocephalic, atraumatic, face symmetric, no Cushingoid features. EYES:  Brown eyes.  Pupils equal round and reactive to light and accomodation.  No conjunctivitis or scleral icterus. ENT:  Oropharynx clear without lesion.  Tongue normal. Mucous membranes moist.  RESPIRATORY:  Clear to  auscultation without rales, wheezes or rhonchi. CARDIOVASCULAR:  Regular rate and rhythm without murmur, rub or gallop. ABDOMEN:  Soft, non-tender, with active bowel sounds, and no hepatosplenomegaly.  No masses. SKIN:  No rashes, ulcers or lesions. EXTREMITIES: No edema, no skin discoloration or tenderness.  No palpable cords. LYMPH NODES: No palpable cervical, supraclavicular, axillary or inguinal adenopathy  NEUROLOGICAL: Unremarkable. PSYCH:  Appropriate.   Appointment on 08/31/2016  Component Date Value Ref Range Status  . Sodium 08/31/2016 133* 135 - 145 mmol/L Final  . Potassium 08/31/2016 4.9  3.5 - 5.1 mmol/L Final  . Chloride 08/31/2016 103  101 - 111 mmol/L Final  . CO2 08/31/2016 24  22 - 32 mmol/L Final  . Glucose, Bld 08/31/2016 124* 65 - 99 mg/dL Final  . BUN 08/31/2016 31* 6 - 20 mg/dL Final  . Creatinine, Ser 08/31/2016 1.41* 0.44 - 1.00 mg/dL Final  . Calcium 08/31/2016 9.5  8.9 - 10.3 mg/dL Final  . Total Protein 08/31/2016 7.5  6.5 - 8.1 g/dL Final  . Albumin 08/31/2016 4.4  3.5 - 5.0 g/dL Final  . AST 08/31/2016 20  15 - 41 U/L Final  . ALT 08/31/2016 16  14 - 54 U/L Final  . Alkaline Phosphatase 08/31/2016 66  38 - 126 U/L Final  . Total Bilirubin 08/31/2016 1.1  0.3 - 1.2 mg/dL Final  . GFR calc non Af  Amer 08/31/2016 32* >60 mL/min Final  . GFR calc Af Amer 08/31/2016 37* >60 mL/min Final   Comment: (NOTE) The eGFR has been calculated using the CKD EPI equation. This calculation has not been validated in all clinical situations. eGFR's persistently <60 mL/min signify possible Chronic Kidney Disease.   . Anion gap 08/31/2016 6  5 - 15 Final  . WBC 08/31/2016 7.0  3.6 - 11.0 K/uL Final  . RBC 08/31/2016 4.23  3.80 - 5.20 MIL/uL Final  . Hemoglobin 08/31/2016 13.5  12.0 - 16.0 g/dL Final  . HCT 08/31/2016 38.8  35.0 - 47.0 % Final  . MCV 08/31/2016 91.7  80.0 - 100.0 fL Final  . MCH 08/31/2016 32.0  26.0 - 34.0 pg Final  . MCHC 08/31/2016 34.9  32.0 - 36.0 g/dL Final  . RDW 08/31/2016 14.1  11.5 - 14.5 % Final  . Platelets 08/31/2016 409  150 - 440 K/uL Final  . Neutrophils Relative % 08/31/2016 66  % Final  . Neutro Abs 08/31/2016 4.7  1.4 - 6.5 K/uL Final  . Lymphocytes Relative 08/31/2016 20  % Final  . Lymphs Abs 08/31/2016 1.4  1.0 - 3.6 K/uL Final  . Monocytes Relative 08/31/2016 10  % Final  . Monocytes Absolute 08/31/2016 0.7  0.2 - 0.9 K/uL Final  . Eosinophils Relative 08/31/2016 3  % Final  . Eosinophils Absolute 08/31/2016 0.2  0 - 0.7 K/uL Final  . Basophils Relative 08/31/2016 1  % Final  . Basophils Absolute 08/31/2016 0.1  0 - 0.1 K/uL Final    Assessment:  Gina [name] is a 83 y.o. female with essential thrombocythemia (ET) diagnosed on 06/24/2015.  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  MPL is negative.  She is on hydroxyurea 500 mg po every Monday, Wednesday and Friday.  She is on a baby aspirin BID.  She has mild renal insufficiency (CrCl 36 ml/min).  She has intermittent mild hyperbilirubinemia (? Gilbert's disease).  Bilirubin has ranged between 1.2 -1.8.  Direct bilirubin was 0.1 on 06/01/2016.  Symptomatically, she has had nausea, abdominal tightness, and increased GERD  symptoms unrelated to PV or her treatment.  She has been off hydroxyurea for 2 weeks to see if her  symptoms improve (no improvement).  Exam is unremarkable.   Platelet count is 409,000.  Plan: 1.  Labs today: CBC with diff, CMP. 2.  Restart hydroxyurea 546m every Mon, Wed, Friday. She is on PPI therapy (Protonix) at this point. Goal platelet count is < 400,000. 3.  Patient to follow-up with Dr LSanda Kleintomorrow regarding GI symptoms. 4.  Continue aspirin 81 mg po BID. 5.  RTC in 3 months for MD assessment and labs (CBC with diff, CMP).   MLequita Asal MD  08/31/2016, 11:53 AM

## 2016-09-01 ENCOUNTER — Ambulatory Visit (INDEPENDENT_AMBULATORY_CARE_PROVIDER_SITE_OTHER): Payer: Medicare HMO | Admitting: Family Medicine

## 2016-09-01 ENCOUNTER — Encounter: Payer: Self-pay | Admitting: Family Medicine

## 2016-09-01 DIAGNOSIS — I6523 Occlusion and stenosis of bilateral carotid arteries: Secondary | ICD-10-CM | POA: Diagnosis not present

## 2016-09-01 DIAGNOSIS — E785 Hyperlipidemia, unspecified: Secondary | ICD-10-CM

## 2016-09-01 DIAGNOSIS — E039 Hypothyroidism, unspecified: Secondary | ICD-10-CM

## 2016-09-01 DIAGNOSIS — K219 Gastro-esophageal reflux disease without esophagitis: Secondary | ICD-10-CM

## 2016-09-01 MED ORDER — ATORVASTATIN CALCIUM 20 MG PO TABS: 20.0000 mg | ORAL_TABLET | Freq: Every day | ORAL | 1 refills | Status: DC

## 2016-09-01 MED ORDER — PANTOPRAZOLE SODIUM 20 MG PO TBEC: 20.0000 mg | DELAYED_RELEASE_TABLET | Freq: Every day | ORAL | 5 refills | Status: DC

## 2016-09-01 NOTE — Patient Instructions (Signed)
Caution: prolonged use of proton pump inhibitors like omeprazole (Prilosec), pantoprazole (Protonix), esomeprazole (Nexium), and others like Dexilant and Aciphex may increase your risk of pneumonia, Clostridium difficile colitis, osteoporosis, anemia and other health complications Try to limit or avoid triggers like coffee, caffeinated beverages, onions, chocolate, spicy foods, peppermint, acid foods like pizza, spaghetti sauce, and orange juice Lose weight if you are overweight or obese Try elevating the head of your bed by placing a small wedge between your mattress and box springs to keep acid in the stomach at night instead of coming up into your esophagus Try taking 3-4 days a week if that handles your symptoms

## 2016-09-01 NOTE — Assessment & Plan Note (Signed)
Back on statin; patient does not want any more tests; will get back on previous dose that had her LDL in the 70s and continue; Rx written

## 2016-09-01 NOTE — Progress Notes (Signed)
BP 126/64   Pulse 82   Temp 97.7 F (36.5 C) (Oral)   Resp 14   Wt 141 lb (64 kg)   SpO2 95%   BMI 24.20 kg/m    Subjective:    Patient ID: Gina [name], female    DOB: 03/29/1926, 83 y.o.   MRN: [id number]  HPI: Gina [name] is a 83 y.o. female  Chief Complaint  Patient presents with  . Follow-up  . lab results    HPI Patient is here for f/u She saw Raquel Sarna on August 25, 2016; those symptoms have completely resolved She is taking protonix; previous prescription from was from Dr. Rayann Heman (GI) and she would like refills She wants to have NO more tests, no DEXA scans, etc.; declined; she says she is going to not obsess over her health Reviewed recent labs; just had CBC and CMP done through heme-onc doctor yesterday She sees Dr. Candiss Norse, nephrologist; her kidney function has declined over the last 3 draws Not more dehydrated even with summer No OTC medicines; no NSAIDs High cholesterol, LDL went up off of atorvastatin; no problems willing to go back on No fatty meats Thyroid function normal She stopped taking the sertraline; she was in a stressful situation with her husband; she does not not want something she has to take every day  Depression screen [facility] 2/9 07/29/2016 06/30/2016 12/31/2015 12/19/2014 10/05/2014  Decreased Interest 0 0 0 0 0  Down, Depressed, Hopeless 1 1 0 0 0  PHQ - 2 Score 1 1 0 0 0   Relevant past medical, surgical, family and social history reviewed Past Medical History:  Diagnosis Date  . Breast mass in female    right breast  . Cancer ([place])    SKIN  . Cataract   . Dermatitis   . Difficult intubation 03/03/2016   September 06, 2013; left nasal fiberoptic intubation #7 ETT; see letter from Dr. Loanne Drilling, Dept of Anest[facility]iology[facility]  . Diverticulitis   . Diverticulosis 05/28/2015  . Essential thrombocytosis ([place]) 07/23/2015  . Genital prolapse   . GERD (gastroesophageal reflux disease)   . 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 Mike Gip  . Headache   .  Hyperlipemia   . Hyperlipidemia    history of   . Hypertension   . Hypothyroidism   . Hypothyroidism   . Mild atherosclerosis of carotid artery 06/25/2015  . MRI contraindicated due to metal implant   . Prolapse of uterus   . Radiculopathy   . Renal cyst   . Renal cyst   . Rosacea   . Shingles   . Thrombocytosis ([place])   . Thyroid disease    Past Surgical History:  Procedure Laterality Date  . ADENOIDECTOMY    . APPENDECTOMY    . biopsy, right breast     . CATARACT EXTRACTION W/PHACO Left 03/25/2015   Procedure: CATARACT EXTRACTION PHACO AND INTRAOCULAR LENS PLACEMENT (IOC);  Surgeon: Estill Cotta, MD;  Location: [facility] ORS;  Service: Ophthalmology;  Laterality: Left;  [place] 02:09 AP% 27.3 CDE 59.97 fluid pack lot #[id number]  . CESAREAN SECTION    . COLON SURGERY    . JOINT REPLACEMENT Right    total hip  . THYROID LOBECTOMY    . THYROIDECTOMY     partial  . TONSILLECTOMY    . TOTAL HIP ARTHROPLASTY    . UPPER GI ENDOSCOPY  10/17/2008   Family History  Problem Relation Age of Onset  . Stroke Mother   .  Stroke Father   . Dementia Maternal Grandfather    Social History   Social History  . Marital status: Married    Spouse name: N/A  . Number of children: N/A  . Years of education: N/A   Occupational History  . Not on file.   Social History Main Topics  . Smoking status: Former Research scientist (life sciences)  . Smokeless tobacco: Never Used  . Alcohol use No  . Drug use: No  . Sexual activity: Not Currently   Other Topics Concern  . Not on file   Social History Narrative  . No narrative on file    Interim medical history since last visit reviewed. Allergies and medications reviewed  Review of Systems Per HPI unless specifically indicated above     Objective:    BP 126/64   Pulse 82   Temp 97.7 F (36.5 C) (Oral)   Resp 14   Wt 141 lb (64 kg)   SpO2 95%   BMI 24.20 kg/m   Wt Readings from Last 3 Encounters:  09/01/16 141 lb (64 kg)  08/31/16 140 lb 8 oz (63.7  kg)  08/25/16 142 lb (64.4 kg)    Physical Exam  Constitutional: She appears well-developed and well-nourished.  HENT:  Mouth/Throat: Mucous membranes are normal.  Eyes: EOM are normal. No scleral icterus.  Cardiovascular: Normal rate and regular rhythm.   Pulmonary/Chest: Effort normal and breath sounds normal.  Abdominal: She exhibits no distension. There is no tenderness.  Psychiatric: She has a normal mood and affect. Her behavior is normal.    Results for orders placed or performed in visit on 08/31/16  Comprehensive metabolic panel  Result Value Ref Range   Sodium 133 (L) 135 - 145 mmol/L   Potassium 4.9 3.5 - 5.1 mmol/L   Chloride 103 101 - 111 mmol/L   CO2 24 22 - 32 mmol/L   Glucose, Bld 124 (H) 65 - 99 mg/dL   BUN 31 (H) 6 - 20 mg/dL   Creatinine, Ser 1.41 (H) 0.44 - 1.00 mg/dL   Calcium 9.5 8.9 - 10.3 mg/dL   Total Protein 7.5 6.5 - 8.1 g/dL   Albumin 4.4 3.5 - 5.0 g/dL   AST 20 15 - 41 U/L   ALT 16 14 - 54 U/L   Alkaline Phosphatase 66 38 - 126 U/L   Total Bilirubin 1.1 0.3 - 1.2 mg/dL   GFR calc non Af Amer 32 (L) >60 mL/min   GFR calc Af Amer 37 (L) >60 mL/min   Anion gap 6 5 - 15  CBC with Differential/Platelet  Result Value Ref Range   WBC 7.0 3.6 - 11.0 K/uL   RBC 4.23 3.80 - 5.20 MIL/uL   Hemoglobin 13.5 12.0 - 16.0 g/dL   HCT 38.8 35.0 - 47.0 %   MCV 91.7 80.0 - 100.0 fL   MCH 32.0 26.0 - 34.0 pg   MCHC 34.9 32.0 - 36.0 g/dL   RDW 14.1 11.5 - 14.5 %   Platelets 409 150 - 440 K/uL   Neutrophils Relative % 66 %   Neutro Abs 4.7 1.4 - 6.5 K/uL   Lymphocytes Relative 20 %   Lymphs Abs 1.4 1.0 - 3.6 K/uL   Monocytes Relative 10 %   Monocytes Absolute 0.7 0.2 - 0.9 K/uL   Eosinophils Relative 3 %   Eosinophils Absolute 0.2 0 - 0.7 K/uL   Basophils Relative 1 %   Basophils Absolute 0.1 0 - 0.1 K/uL  Assessment & Plan:   Problem List Items Addressed This Visit      Cardiovascular and Mediastinum   Mild atherosclerosis of carotid artery  (Chronic)    Back on statin; patient does not want any more tests; will get back on previous dose that had her LDL in the 70s and continue; Rx written      Relevant Medications   atorvastatin (LIPITOR) 20 MG tablet     Digestive   GERD without esophagitis    Discussed risks of prolonged use of PPIs; she'll try to take every other day or even every third day (frequency of proton pump turn-over and regeneration); see AVS      Relevant Medications   pantoprazole (PROTONIX) 20 MG tablet     Endocrine   Hypothyroidism, adult    Last TSH normal; continue same dose        Other   Dyslipidemia    Start back on statin; reviewed her previous 3 lipid panels with her; avoid fatty meat (already doing this)      Relevant Medications   atorvastatin (LIPITOR) 20 MG tablet       Follow up plan: Return if symptoms worsen or fail to improve.  An after-visit summary was printed and given to the patient at [place].  Please see the patient instructions which may contain other information and recommendations beyond what is mentioned above in the assessment and plan.  Meds ordered this encounter  Medications  . atorvastatin (LIPITOR) 20 MG tablet    Sig: Take 1 tablet (20 mg total) by mouth daily.    Dispense:  90 tablet    Refill:  1  . pantoprazole (PROTONIX) 20 MG tablet    Sig: Take 1 tablet (20 mg total) by mouth daily. Or three days a week if tolerated    Dispense:  30 tablet    Refill:  5    No orders of the defined types were placed in this encounter.

## 2016-09-01 NOTE — Assessment & Plan Note (Signed)
Last TSH normal; continue same dose

## 2016-09-01 NOTE — Assessment & Plan Note (Signed)
Start back on statin; reviewed her previous 3 lipid panels with her; avoid fatty meat (already doing this)

## 2016-09-01 NOTE — Assessment & Plan Note (Signed)
Discussed risks of prolonged use of PPIs; she'll try to take every other day or even every third day (frequency of proton pump turn-over and regeneration); see AVS

## 2016-10-15 ENCOUNTER — Ambulatory Visit (INDEPENDENT_AMBULATORY_CARE_PROVIDER_SITE_OTHER): Payer: Medicare HMO | Admitting: Gastroenterology

## 2016-10-15 ENCOUNTER — Encounter: Payer: Self-pay | Admitting: Gastroenterology

## 2016-10-15 ENCOUNTER — Other Ambulatory Visit: Payer: Self-pay

## 2016-10-15 VITALS — BP 144/79 | HR 99 | Temp 97.7°F | Ht 64.0 in | Wt 141.9 lb

## 2016-10-15 DIAGNOSIS — K30 Functional dyspepsia: Secondary | ICD-10-CM | POA: Diagnosis not present

## 2016-10-15 NOTE — Progress Notes (Signed)
Cephas Darby, MD [address]  [place]  [place], [place] [address]  Main: [phone]  Fax: [phone]    Gastroenterology Consultation  Referring Provider:     Arnetha Courser, MD Primary Care Physician:  Arnetha Courser, MD Primary Gastroenterologist:  Dr. Cephas Darby Reason for Consultation:     GERD        HPI:   Gina [name] is a 83 y.o. y/o female referred by Dr. Arnetha Courser, MD  for consultation & management  of GERD. She is a very pleasant lady with high blood pressure, essential thrombocytosis she reports having problems with indigestion almost all her life and has been taking Zantac as needed. She is currently on Protonix 20 mg every 2 days for indigestion. She describes her symptoms as substernal discomfort with belching, bandlike pain across the rib cage bilateral. She reports that Protonix has been helping. She has been very stressful lately being a caregiver for her 48 year old husband who is diagnosed with metastatic prostate cancer. She attributes that her symptoms have gotten worse secondary to her stress. She is very independent, manages her ADLs, walks daily. She denies smoking or alcohol. She smoked 25 years ago for 30 years. She denies any food triggers. Her H. pylori breath test was negative 08/25/2016. She does not have iron deficiency anemia. She denies dysphagia, epigastric pain, weight loss, loss of appetite, hematemesis, melena. She denies NSAIDs.  GI Procedures: EGD 07/29/2016 showed small hiatal hernia, gastric erythema, biopsies performed, normal duodenum Path report not available Colonoscopy 10/30/2009 sigmoid colon diverticulosis  Past Medical History:  Diagnosis Date  . Breast mass in female    right breast  . Cancer ([place])    SKIN  . Cataract   . Dermatitis   . Difficult intubation 03/03/2016   September 06, 2013; left nasal fiberoptic intubation #7 ETT; see letter from Dr. Loanne Drilling, Dept of Anesthesiology, [facility]  . Diverticulitis   .  Diverticulosis 05/28/2015  . Essential thrombocytosis ([place]) 07/23/2015  . Genital prolapse   . GERD (gastroesophageal reflux disease)   . 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 Mike Gip  . Headache   . Hyperlipemia   . Hyperlipidemia    history of   . Hypertension   . Hypothyroidism   . Hypothyroidism   . Mild atherosclerosis of carotid artery 06/25/2015  . MRI contraindicated due to metal implant   . Prolapse of uterus   . Radiculopathy   . Renal cyst   . Renal cyst   . Rosacea   . Shingles   . Thrombocytosis ([place])   . Thyroid disease     Past Surgical History:  Procedure Laterality Date  . ADENOIDECTOMY    . APPENDECTOMY    . biopsy, right breast     . CATARACT EXTRACTION W/PHACO Left 03/25/2015   Procedure: CATARACT EXTRACTION PHACO AND INTRAOCULAR LENS PLACEMENT (IOC);  Surgeon: Estill Cotta, MD;  Location: [facility] ORS;  Service: Ophthalmology;  Laterality: Left;  [place] 02:09 AP% 27.3 CDE 59.97 fluid pack lot #[id number]  . CESAREAN SECTION    . COLON SURGERY    . JOINT REPLACEMENT Right    total hip  . THYROID LOBECTOMY    . THYROIDECTOMY     partial  . TONSILLECTOMY    . TOTAL HIP ARTHROPLASTY    . UPPER GI ENDOSCOPY  10/17/2008    Prior to Admission medications   Medication Sig Start Date End Date Taking? Authorizing Provider  amLODipine (  NORVASC) 5 MG tablet Take 1 tablet (5 mg total) by mouth daily. 01/03/16  Yes Lada, Satira Anis, MD  aspirin EC 81 MG tablet Take 81 mg by mouth 2 (two) times daily.  08/19/15  Yes Lada, Satira Anis, MD  atorvastatin (LIPITOR) 20 MG tablet Take 1 tablet (20 mg total) by mouth daily. 09/01/16  Yes Lada, Satira Anis, MD  Cholecalciferol (VITAMIN D3) 1000 units CAPS Take by mouth daily.    Yes [provider]  hydroxyurea (HYDREA) 500 MG capsule TAKE ONE CAPSULE BY MOUTH ON MONDAY, WEDNESDAY AND FRIDAY. MAY TAKE WITH FOOD TO MINIMIZE GI SIDE EFFECTS. 06/26/16  Yes Corcoran, Melissa C, MD  irbesartan (AVAPRO) 150 MG tablet Take 1  tablet (150 mg total) by mouth daily. 02/28/15  Yes Bobetta Lime, MD  ketoconazole (NIZORAL) 2 % shampoo Apply 1 application topically 2 (two) times a week.  05/28/15  Yes [provider]  levothyroxine (SYNTHROID, LEVOTHROID) 88 MCG tablet Take 1 tablet (88 mcg total) by mouth daily before breakfast. 04/06/16  Yes Lada, Satira Anis, MD  pantoprazole (PROTONIX) 20 MG tablet Take 1 tablet (20 mg total) by mouth daily. Or three days a week if tolerated 09/01/16  Yes Lada, Satira Anis, MD  cetirizine HCl (CETIRIZINE HCL CHILDRENS ALRGY) 5 MG/5ML SOLN Take by mouth.    [provider]  sertraline (ZOLOFT) 25 MG tablet  07/29/16   [provider]    Family History  Problem Relation Age of Onset  . Stroke Mother   . Stroke Father   . Dementia Maternal Grandfather      Social History  Substance Use Topics  . Smoking status: Former Research scientist (life sciences)  . Smokeless tobacco: Never Used  . Alcohol use No    Allergies as of 10/15/2016 - Review Complete 10/15/2016  Allergen Reaction Noted  . Novocain [procaine] Palpitations 03/20/2015    Review of Systems:    All systems reviewed and negative except where noted in HPI.   Physical Exam:  BP (!) 144/79   Pulse 99   Temp 97.7 F (36.5 C) (Oral)   Ht 5\' 4"  (1.626 m)   Wt 64.4 kg (141 lb 14.4 oz)   BMI 24.36 kg/m  No LMP recorded. Patient is postmenopausal.  General:   Alert,  Well-developed, well-nourished, pleasant and cooperative in NAD Head:  Normocephalic and atraumatic. Eyes:  Sclera clear, no icterus.   Conjunctiva pink. Ears:  Normal auditory acuity. Nose:  No deformity, discharge, or lesions. Mouth:  No deformity or lesions,oropharynx pink & moist. Neck:  Supple; no masses or thyromegaly. Lungs:  Respirations even and unlabored.  Clear throughout to auscultation.   No wheezes, crackles, or rhonchi. No acute distress. Heart:  Regular rate and rhythm; no murmurs, clicks, rubs, or gallops. Abdomen:  Normal bowel sounds.   No bruits.  Soft, non-tender and non-distended without masses, hepatosplenomegaly or hernias noted.  No guarding or rebound tenderness.   Rectal: Nor performed Msk:  Symmetrical without gross deformities. Good, equal movement & strength bilaterally. Pulses:  Normal pulses noted. Extremities:  No clubbing or edema.  No cyanosis. Neurologic:  Alert and oriented x3;  grossly normal neurologically. Skin:  Intact without significant lesions or rashes. No jaundice. Lymph Nodes:  No significant cervical adenopathy. Psych:  Alert and cooperative. Normal mood and affect.  Imaging Studies:   Assessment and Plan:   Gina [name] is a 83 y.o. y/o female with Hypertension, essential thrombocytosis with several years of substernal chest pain associated with  belching which is well controlled on H2 blocker/PPI as needed. I do not see any alarm signs or symptoms to recommend upper endoscopy. I suggested her to either take Protonix daily for 4 weeks or switch to Zantac as needed. She will contact me if her symptoms persist. Can consider EGD at the time, however patient is reluctant to have this procedure done.   Follow up as needed   Cephas Darby, MD

## 2016-10-21 ENCOUNTER — Ambulatory Visit (INDEPENDENT_AMBULATORY_CARE_PROVIDER_SITE_OTHER): Payer: Medicare HMO | Admitting: Obstetrics & Gynecology

## 2016-10-21 ENCOUNTER — Encounter: Payer: Self-pay | Admitting: Obstetrics & Gynecology

## 2016-10-21 VITALS — BP 130/80 | HR 70 | Ht 64.0 in | Wt 143.0 lb

## 2016-10-21 DIAGNOSIS — N814 Uterovaginal prolapse, unspecified: Secondary | ICD-10-CM | POA: Diagnosis not present

## 2016-10-21 NOTE — Progress Notes (Signed)
  HPI:      [name] is a 83 y.o. [phone] who presents today for her pessary follow up and examination related to her pelvic floor weakening.  Pt reports tolerating the pessary well with  no vaginal bleeding and  no vaginal discharge.  Symptoms of pelvic floor weakening have greatly improved. She is voiding and defecating without difficulty. She currently has a #3 RING pessary.  PMHx: She  has a past medical history of Breast mass in female; Cancer [facility]); Cataract; Dermatitis; Difficult intubation (03/03/2016); Diverticulitis; Diverticulosis (05/28/2015); Essential thrombocytosis ([place]) (07/23/2015); Genital prolapse; GERD (gastroesophageal reflux disease); Gilbert syndrome (06/30/2016); Headache; Hyperlipemia; Hyperlipidemia; Hypertension; Hypothyroidism; Hypothyroidism; Mild atherosclerosis of carotid artery (06/25/2015); MRI contraindicated due to metal implant; Prolapse of uterus; Radiculopathy; Renal cyst; Renal cyst; Rosacea; Shingles; Thrombocytosis ([place]); and Thyroid disease. Also,  has a past surgical history that includes Tonsillectomy; Appendectomy; Cesarean section; Joint replacement (Right); Thyroidectomy; Colon surgery; Cataract extraction w/PHACO (Left, 03/25/2015); Adenoidectomy; biopsy, right breast ; Total hip arthroplasty; Thyroid lobectomy; and Upper gi endoscopy (10/17/2008)., family history includes Dementia in her maternal grandfather; Stroke in her father and mother.,  reports that she has quit smoking. She has never used smokeless tobacco. She reports that she does not drink alcohol or use drugs.  She has a current medication list which includes the following prescription(s): amlodipine, aspirin ec, atorvastatin, cetirizine hcl, vitamin d3, hydroxyurea, irbesartan, ketoconazole, levothyroxine, pantoprazole, and sertraline. Also, is allergic to novocain [procaine].  Review of Systems  All other systems reviewed and are negative.   Objective: BP 130/80   Pulse 70   Ht 5\' 4"  (1.626  m)   Wt 143 lb (64.9 kg)   BMI 24.55 kg/m  Physical Exam  Constitutional: She is oriented to person, place, and time. She appears well-developed and well-nourished. No distress.  Genitourinary: Vagina normal and uterus normal. Pelvic exam was performed with patient supine. There is no rash, tenderness or lesion on the right labia. There is no rash, tenderness or lesion on the left labia. No erythema or bleeding in the vagina. Right adnexum does not display mass and does not display tenderness. Left adnexum does not display mass and does not display tenderness. Cervix does not exhibit motion tenderness, discharge, polyp or nabothian cyst.   Uterus is mobile and midaxial. Uterus is not enlarged or exhibiting a mass.  Genitourinary Comments: prolapse and cystocele present  Abdominal: Soft. She exhibits no distension. There is no tenderness.  Musculoskeletal: Normal range of motion.  Neurological: She is alert and oriented to person, place, and time. No cranial nerve deficit.  Skin: Skin is warm and dry.  Psychiatric: She has a normal mood and affect.    Pessary Care Pessary removed and cleaned.  Vagina checked - without erosions - pessary replaced.  A/P: Pessary was cleaned and replaced today. Instructions given for care. Concerning symptoms to observe for are counseled to patient. Follow up scheduled for 3 months.  Barnett Applebaum, MD, [facility], [facility] 10/21/2016  10:17 AM

## 2016-10-21 NOTE — Patient Instructions (Signed)
Pelvic Organ Prolapse Pelvic organ prolapse is the stretching, bulging, or dropping of pelvic organs into an abnormal position. It happens when the muscles and tissues that surround and support pelvic structures are stretched or weak. Pelvic organ prolapse can involve:  Vagina (vaginal prolapse).  Uterus (uterine prolapse).  Bladder (cystocele).  Rectum (rectocele).  Intestines (enterocele).  When organs other than the vagina are involved, they often bulge into the vagina or protrude from the vagina, depending on how severe the prolapse is. What are the causes? Causes of this condition include:  Pregnancy, labor, and childbirth.  Long-lasting (chronic) cough.  Chronic constipation.  Obesity.  Past pelvic surgery.  Aging. During and after menopause, a decreased production of the hormone estrogen can weaken pelvic ligaments and muscles.  Consistently lifting more than 50 lb (23 kg).  Buildup of fluid in the abdomen due to certain diseases and other conditions.  What are the signs or symptoms? Symptoms of this condition include:  Loss of bladder control when you cough, sneeze, strain, and exercise (stress incontinence). This may be worse immediately following childbirth, and it may gradually improve over time.  Feeling pressure in your pelvis or vagina. This pressure may increase when you cough or when you are having a bowel movement.  A bulge that protrudes from the opening of your vagina or against your vaginal wall. If your uterus protrudes through the opening of your vagina and rubs against your clothing, you may also experience soreness, ulcers, infection, pain, and bleeding.  Increased effort to have a bowel movement or urinate.  Pain in your low back.  Pain, discomfort, or disinterest in sexual intercourse.  Repeated bladder infections (urinary tract infections).  Difficulty inserting or inability to insert a tampon or applicator.  In some people, this  condition does not cause any symptoms. How is this diagnosed? Your health care provider may perform an internal and external vaginal and rectal exam. During the exam, you may be asked to cough and strain while you are lying down, sitting, and standing up. Your health care provider will determine if other tests are required, such as bladder function tests. How is this treated? In most cases, this condition needs to be treated only if it produces symptoms. No treatment is guaranteed to correct the prolapse or relieve the symptoms completely. Treatment may include:  Lifestyle changes, such as: ? Avoiding drinking beverages that contain caffeine. ? Increasing your intake of high-fiber foods. This can help to decrease constipation and straining during bowel movements. ? Emptying your bladder at scheduled times (bladder training therapy). This can help to reduce or avoid urinary incontinence. ? Losing weight if you are overweight or obese.  Estrogen. Estrogen may help mild prolapse by increasing the strength and tone of pelvic floor muscles.  Kegel exercises. These may help mild cases of prolapse by strengthening and tightening the muscles of the pelvic floor.  Pessary insertion. A pessary is a soft, flexible device that is placed into your vagina by your health care provider to help support the vaginal walls and keep pelvic organs in place.  Surgery. This is often the only form of treatment for severe prolapse. Different types of surgeries are available.  Follow these instructions at home:  Wear a sanitary pad or absorbent product if you have urinary incontinence.  Avoid heavy lifting and straining with exercise and work. Do not hold your breath when you perform mild to moderate lifting and exercise activities. Limit your activities as directed by your health care   provider.  Take medicines only as directed by your health care provider.  Perform Kegel exercises as directed by your health care  provider.  If you have a pessary, take care of it as directed by your health care provider. Contact a health care provider if:  Your symptoms interfere with your daily activities or sex life.  You need medicine to help with the discomfort.  You notice bleeding from the vagina that is not related to your period.  You have a fever.  You have pain or bleeding when you urinate.  You have bleeding when you have a bowel movement.  You lose urine when you have sex.  You have chronic constipation.  You have a pessary that falls out.  You have vaginal discharge that has a bad smell.  You have low abdominal pain or cramping that is unusual for you. This information is not intended to replace advice given to you by your health care provider. Make sure you discuss any questions you have with your health care provider. Document Released: 09/06/2013 Document Revised: 07/18/2015 Document Reviewed: 04/24/2013 Elsevier Interactive Patient Education  2018 [employer]  

## 2016-10-31 DIAGNOSIS — J069 Acute upper respiratory infection, unspecified: Secondary | ICD-10-CM | POA: Diagnosis not present

## 2016-11-02 ENCOUNTER — Ambulatory Visit: Payer: Medicare HMO | Admitting: Gastroenterology

## 2016-11-12 DIAGNOSIS — I10 Essential (primary) hypertension: Secondary | ICD-10-CM | POA: Diagnosis not present

## 2016-11-12 DIAGNOSIS — N183 Chronic kidney disease, stage 3 (moderate): Secondary | ICD-10-CM | POA: Diagnosis not present

## 2016-11-23 ENCOUNTER — Telehealth: Payer: Self-pay

## 2016-11-23 ENCOUNTER — Ambulatory Visit (INDEPENDENT_AMBULATORY_CARE_PROVIDER_SITE_OTHER): Payer: Medicare HMO

## 2016-11-23 DIAGNOSIS — Z23 Encounter for immunization: Secondary | ICD-10-CM | POA: Diagnosis not present

## 2016-11-23 MED ORDER — DICLOFENAC SODIUM 1 % TD GEL: TRANSDERMAL | 2 refills | Status: DC

## 2016-11-23 NOTE — Telephone Encounter (Signed)
Pt asking for inflammatory rubbing cream for her left knee.

## 2016-12-03 ENCOUNTER — Inpatient Hospital Stay: Payer: Medicare HMO | Attending: Hematology and Oncology

## 2016-12-03 ENCOUNTER — Inpatient Hospital Stay (HOSPITAL_BASED_OUTPATIENT_CLINIC_OR_DEPARTMENT_OTHER): Payer: Medicare HMO | Admitting: Hematology and Oncology

## 2016-12-03 ENCOUNTER — Other Ambulatory Visit: Payer: Self-pay | Admitting: *Deleted

## 2016-12-03 VITALS — BP 129/74 | HR 84 | Temp 97.6°F | Wt 145.3 lb

## 2016-12-03 DIAGNOSIS — Z79899 Other long term (current) drug therapy: Secondary | ICD-10-CM | POA: Diagnosis not present

## 2016-12-03 DIAGNOSIS — N819 Female genital prolapse, unspecified: Secondary | ICD-10-CM

## 2016-12-03 DIAGNOSIS — D473 Essential (hemorrhagic) thrombocythemia: Secondary | ICD-10-CM

## 2016-12-03 DIAGNOSIS — F17210 Nicotine dependence, cigarettes, uncomplicated: Secondary | ICD-10-CM | POA: Diagnosis not present

## 2016-12-03 DIAGNOSIS — E785 Hyperlipidemia, unspecified: Secondary | ICD-10-CM | POA: Insufficient documentation

## 2016-12-03 DIAGNOSIS — N63 Unspecified lump in unspecified breast: Secondary | ICD-10-CM | POA: Diagnosis not present

## 2016-12-03 DIAGNOSIS — Z8719 Personal history of other diseases of the digestive system: Secondary | ICD-10-CM | POA: Diagnosis not present

## 2016-12-03 DIAGNOSIS — K5900 Constipation, unspecified: Secondary | ICD-10-CM | POA: Insufficient documentation

## 2016-12-03 DIAGNOSIS — E039 Hypothyroidism, unspecified: Secondary | ICD-10-CM

## 2016-12-03 DIAGNOSIS — I2510 Atherosclerotic heart disease of native coronary artery without angina pectoris: Secondary | ICD-10-CM | POA: Insufficient documentation

## 2016-12-03 DIAGNOSIS — I10 Essential (primary) hypertension: Secondary | ICD-10-CM

## 2016-12-03 DIAGNOSIS — Z7982 Long term (current) use of aspirin: Secondary | ICD-10-CM | POA: Diagnosis not present

## 2016-12-03 DIAGNOSIS — N2889 Other specified disorders of kidney and ureter: Secondary | ICD-10-CM | POA: Diagnosis not present

## 2016-12-03 DIAGNOSIS — N281 Cyst of kidney, acquired: Secondary | ICD-10-CM

## 2016-12-03 LAB — COMPREHENSIVE METABOLIC PANEL
ALT: 21 U/L (ref 14–54)
AST: 27 U/L (ref 15–41)
Albumin: 4.4 g/dL (ref 3.5–5.0)
Alkaline Phosphatase: 87 U/L (ref 38–126)
Anion gap: 10 (ref 5–15)
BUN: 28 mg/dL — ABNORMAL HIGH (ref 6–20)
CO2: 23 mmol/L (ref 22–32)
Calcium: 9.3 mg/dL (ref 8.9–10.3)
Chloride: 100 mmol/L — ABNORMAL LOW (ref 101–111)
Creatinine, Ser: 1.16 mg/dL — ABNORMAL HIGH (ref 0.44–1.00)
GFR calc Af Amer: 47 mL/min — ABNORMAL LOW (ref >60)
GFR calc non Af Amer: 41 mL/min — ABNORMAL LOW (ref >60)
Glucose, Bld: 109 mg/dL — ABNORMAL HIGH (ref 65–99)
Potassium: 4.8 mmol/L (ref 3.5–5.1)
Sodium: 133 mmol/L — ABNORMAL LOW (ref 135–145)
Total Bilirubin: 1.2 mg/dL (ref 0.3–1.2)
Total Protein: 7.7 g/dL (ref 6.5–8.1)

## 2016-12-03 LAB — CBC WITH DIFFERENTIAL/PLATELET
Basophils Absolute: 0.1 10*3/uL (ref 0–0.1)
Basophils Relative: 1 %
Eosinophils Absolute: 0.2 10*3/uL (ref 0–0.7)
Eosinophils Relative: 3 %
HCT: 38.6 % (ref 35.0–47.0)
Hemoglobin: 12.9 g/dL (ref 12.0–16.0)
Lymphocytes Relative: 18 %
Lymphs Abs: 1.4 10*3/uL (ref 1.0–3.6)
MCH: 30.8 pg (ref 26.0–34.0)
MCHC: 33.6 g/dL (ref 32.0–36.0)
MCV: 91.9 fL (ref 80.0–100.0)
Monocytes Absolute: 0.6 10*3/uL (ref 0.2–0.9)
Monocytes Relative: 7 %
Neutro Abs: 5.5 10*3/uL (ref 1.4–6.5)
Neutrophils Relative %: 71 %
Platelets: 407 10*3/uL (ref 150–440)
RBC: 4.19 MIL/uL (ref 3.80–5.20)
RDW: 15.2 % — ABNORMAL HIGH (ref 11.5–14.5)
WBC: 7.8 10*3/uL (ref 3.6–11.0)

## 2016-12-03 NOTE — Progress Notes (Signed)
[facility] day:  12/03/2016  Chief Complaint: Gina [name] is a 83 y.o. female with essential thrombocythemia (ET) who is seen for 3 month assessment.  HPI:  The patient was last seen in the medical oncology clinic on 08/31/2016.  At that time, she described nausea, abdominal tightness, and increased GERD symptoms unrelated to PV or her treatment.  She had been off hydroxyurea for 2 weeks to see if her symptoms improve (no improvement).  Exam was unremarkable.   Platelet count was 409,000.  We discussed reinstitution of hydroxyurea.  During the interim, patient is doing "wonderful". Patient has been seen by GI for nausea, constipation, and reflux. She was told to reduce chocolate intake to help with her reflux. Patient continues to have chronic constipation. She uses Miralax daily. Patient denies any B symptoms. There have been no interval infections.  Patient continues her Hydroxyurea 544m on Monday, Wednesday, and Friday. She takes aspirin twice a day.    Past Medical History:  Diagnosis Date  . Breast mass in female    right breast  . Cancer ([place]    SKIN  . Cataract   . Dermatitis   . Difficult intubation 03/03/2016   September 06, 2013; left nasal fiberoptic intubation #7 ETT; see letter [facility]anne Drilling [facility] . Diverticulitis   . Diverticulosis 05/28/2015  . Essential thrombocytosis ([place] 07/23/2015  . Genital prolapse   . GERD (gastroesophageal reflux disease)   . 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 CMike Gip . Headache   . Hyperlipemia   . Hyperlipidemia    history of   . Hypertension   . Hypothyroidism   . Hypothyroidism   . Mild atherosclerosis of carotid artery 06/25/2015  . MRI contraindicated due to metal implant   . Prolapse of uterus   . Radiculopathy   . Renal cyst   . Renal cyst   . Rosacea   . Shingles   . Thrombocytosis ([place]   . Thyroid disease     Past Surgical History:   Procedure Laterality Date  . ADENOIDECTOMY    . APPENDECTOMY    . biopsy, right breast     . CATARACT EXTRACTION W/PHACO Left 03/25/2015   Procedure: CATARACT EXTRACTION PHACO AND INTRAOCULAR LENS PLACEMENT (IOC);  Surgeon: SEstill Cotta MD;  Location: [facility] ORS;  Service: Ophthalmology;  Laterality: Left;  [place]:09 AP% 27.3 CDE 59.97 fluid pack lot [id number]  . CESAREAN SECTION    . COLON SURGERY    . JOINT REPLACEMENT Right    total hip  . THYROID LOBECTOMY    . THYROIDECTOMY     partial  . TONSILLECTOMY    . TOTAL HIP ARTHROPLASTY    . UPPER GI ENDOSCOPY  10/17/2008    Family History  Problem Relation Age of Onset  . Stroke Mother   . Stroke Father   . Dementia Maternal Grandfather     Social History:  reports that she has quit smoking. She has never used smokeless tobacco. She reports that she does not drink alcohol or use drugs.  [place]  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  She will be 89 in 2 weeks.  She has 8 children (3 + 5 between she and her husband).  Her husband is [id number]  He has stage IV prostate cancer and is treated by Dr. Grayland Ormond.  The patient is alone today.  Allergies:  Allergies  Allergen Reactions  . Novocain [Procaine] Palpitations    Current Medications: Current Outpatient Prescriptions  Medication Sig Dispense Refill  . amLODipine (NORVASC) 5 MG tablet Take 1 tablet (5 mg total) by mouth daily. 90 tablet 3  . aspirin EC 81 MG tablet Take 81 mg by mouth 2 (two) times daily.     Marland Kitchen atorvastatin (LIPITOR) 20 MG tablet Take 1 tablet (20 mg total) by mouth daily. 90 tablet 1  . cetirizine HCl (CETIRIZINE HCL CHILDRENS ALRGY) 5 MG/5ML SOLN Take by mouth.    . Cholecalciferol (VITAMIN D3) 1000 units CAPS Take by mouth daily.     . diclofenac sodium (VOLTAREN) 1 % GEL Apply 2-4 grams over the knee(s)  four times a day if needed for pain 100 g 2  . hydroxyurea (HYDREA) 500 MG capsule TAKE ONE CAPSULE BY MOUTH ON MONDAY, WEDNESDAY AND FRIDAY. MAY TAKE WITH FOOD TO MINIMIZE GI SIDE EFFECTS. 30 capsule 3  . irbesartan (AVAPRO) 150 MG tablet Take 1 tablet (150 mg total) by mouth daily. 90 tablet 5  . ketoconazole (NIZORAL) 2 % shampoo Apply 1 application topically 2 (two) times a week.     . levothyroxine (SYNTHROID, LEVOTHROID) 88 MCG tablet Take 1 tablet (88 mcg total) by mouth daily before breakfast. 90 tablet 3  . pantoprazole (PROTONIX) 20 MG tablet Take 1 tablet (20 mg total) by mouth daily. Or three days a week if tolerated 30 tablet 5   No current facility-administered medications for this visit.     Review of Systems:  GENERAL:  Feeling "wonderful".  No fevers or sweats.  Weight gained 5 pounds. PERFORMANCE STATUS (ECOG):  1 HEENT:  No visual changes, runny nose, sore throat, mouth sores or tenderness. Lungs: No shortness of breath or cough.  No hemoptysis. Cardiac:  No chest pain, palpitations, orthopnea, or PND. GI:   Chronic constipation.  Reflux in check.  No diarrhea, constipation, melena or hematochezia. GU:  No urgency, frequency, dysuria, or hematuria. Musculoskeletal:  Charlie horse in calves, intermittent.  No back pain.  No joint pain.  No muscle tenderness. Extremities:  No pain or swelling. Skin:  No rashes or skin changes. Neuro:  No headache, numbness or weakness, balance or coordination issues. Endocrine:  No diabetes.  Thyroid issues disease on Synthroid.  No hot flashes or night sweats. Psych:  No mood changes, depression or anxiety. Pain:  No focal pain. Review of systems:  All other systems reviewed and found to be negative.  Physical Exam: Blood pressure 129/74, pulse 84, temperature 97.6 F (36.4 C), temperature source Tympanic, weight 145 lb 5 oz (65.9 kg). GENERAL:  Well developed, well nourished, woman sitting comfortably in the exam room in no acute  distress. MENTAL STATUS:  Alert and oriented to person, place and time. HEAD:  Short gray hair.  Normocephalic, atraumatic, face symmetric, no Cushingoid features. EYES:  Brown eyes.  Pupils equal round and reactive to light and accomodation.  No conjunctivitis or scleral icterus. ENT:  Oropharynx clear without lesion.  Tongue normal. Mucous membranes moist.  RESPIRATORY:  Clear to auscultation without rales, wheezes or rhonchi. CARDIOVASCULAR:  Regular rate and rhythm without murmur, rub or gallop. ABDOMEN:  Soft, non-tender, with active bowel sounds, and no hepatosplenomegaly.  No masses. SKIN:  No rashes, ulcers or lesions. EXTREMITIES: No edema, no skin discoloration or tenderness.  No  palpable cords. LYMPH NODES: No palpable cervical, supraclavicular, axillary or inguinal adenopathy  NEUROLOGICAL: Unremarkable. PSYCH:  Appropriate.   Appointment on 12/03/2016  Component Date Value Ref Range Status  . Sodium 12/03/2016 133* 135 - 145 mmol/L Final  . Potassium 12/03/2016 4.8  3.5 - 5.1 mmol/L Final  . Chloride 12/03/2016 100* 101 - 111 mmol/L Final  . CO2 12/03/2016 23  22 - 32 mmol/L Final  . Glucose, Bld 12/03/2016 109* 65 - 99 mg/dL Final  . BUN 12/03/2016 28* 6 - 20 mg/dL Final  . Creatinine, Ser 12/03/2016 1.16* 0.44 - 1.00 mg/dL Final  . Calcium 12/03/2016 9.3  8.9 - 10.3 mg/dL Final  . Total Protein 12/03/2016 7.7  6.5 - 8.1 g/dL Final  . Albumin 12/03/2016 4.4  3.5 - 5.0 g/dL Final  . AST 12/03/2016 27  15 - 41 U/L Final  . ALT 12/03/2016 21  14 - 54 U/L Final  . Alkaline Phosphatase 12/03/2016 87  38 - 126 U/L Final  . Total Bilirubin 12/03/2016 1.2  0.3 - 1.2 mg/dL Final  . GFR calc non Af Amer 12/03/2016 41* >60 mL/min Final  . GFR calc Af Amer 12/03/2016 47* >60 mL/min Final   Comment: (NOTE) The eGFR has been calculated using the CKD EPI equation. This calculation has not been validated in all clinical situations. eGFR's persistently <60 mL/min signify possible  Chronic Kidney Disease.   . Anion gap 12/03/2016 10  5 - 15 Final  . WBC 12/03/2016 7.8  3.6 - 11.0 K/uL Final  . RBC 12/03/2016 4.19  3.80 - 5.20 MIL/uL Final  . Hemoglobin 12/03/2016 12.9  12.0 - 16.0 g/dL Final  . HCT 12/03/2016 38.6  35.0 - 47.0 % Final  . MCV 12/03/2016 91.9  80.0 - 100.0 fL Final  . MCH 12/03/2016 30.8  26.0 - 34.0 pg Final  . MCHC 12/03/2016 33.6  32.0 - 36.0 g/dL Final  . RDW 12/03/2016 15.2* 11.5 - 14.5 % Final  . Platelets 12/03/2016 407  150 - 440 K/uL Final  . Neutrophils Relative % 12/03/2016 71  % Final  . Neutro Abs 12/03/2016 5.5  1.4 - 6.5 K/uL Final  . Lymphocytes Relative 12/03/2016 18  % Final  . Lymphs Abs 12/03/2016 1.4  1.0 - 3.6 K/uL Final  . Monocytes Relative 12/03/2016 7  % Final  . Monocytes Absolute 12/03/2016 0.6  0.2 - 0.9 K/uL Final  . Eosinophils Relative 12/03/2016 3  % Final  . Eosinophils Absolute 12/03/2016 0.2  0 - 0.7 K/uL Final  . Basophils Relative 12/03/2016 1  % Final  . Basophils Absolute 12/03/2016 0.1  0 - 0.1 K/uL Final    Assessment:  Milissa Fesperman is a 83 y.o. female with essential thrombocythemia (ET) diagnosed on 06/24/2015.  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  MPL is negative.  She is on hydroxyurea 500 mg po every Monday, Wednesday and Friday.  She is on a baby aspirin BID.  She has mild renal insufficiency (CrCl 36 ml/min).  She has intermittent mild hyperbilirubinemia (? Gilbert's disease).  Bilirubin has ranged between 1.2 -1.8.  Direct bilirubin was 0.1 on 06/01/2016.  Symptomatically, patient is feeling "wonderful". She has chronic constipation and increased GERD symptoms unrelated to PV or her treatment.  She is seeing GI.  Patient taking Hydroxyurea 53m on Monday, Wednesday, and Friday.  Exam is unremarkable.   Platelet count is 407,000.  Plan: 1.  Labs today: CBC with diff, CMP. 2.  Continue hydroxyurea 508mevery  Mon, Wed, Friday.  Goal platelet count is < 400,000. 3.  Continue aspirin 81 mg po BID. 4.  RTC in  3 months for MD assessment and labs (CBC with diff, CMP).   Lequita Asal, MD  12/03/2016, 10:53 AM   I saw and evaluated the patient, participating in the key portions of the service and reviewing pertinent diagnostic studies and records.  I reviewed the nurse practitioner's note and agree with the findings and the plan.  The assessment and plan were discussed with the patient.  A few questions were asked by the patient and answered.   Nolon Stalls, MD 12/03/2016,10:53 AM

## 2016-12-03 NOTE — Progress Notes (Signed)
Pt in for follow up.  States "feels great" has no concerns at this time.

## 2016-12-27 ENCOUNTER — Encounter: Payer: Self-pay | Admitting: Hematology and Oncology

## 2016-12-30 DIAGNOSIS — I10 Essential (primary) hypertension: Secondary | ICD-10-CM | POA: Diagnosis not present

## 2016-12-30 DIAGNOSIS — N183 Chronic kidney disease, stage 3 (moderate): Secondary | ICD-10-CM | POA: Diagnosis not present

## 2017-01-05 ENCOUNTER — Encounter: Payer: Self-pay | Admitting: Family Medicine

## 2017-01-05 ENCOUNTER — Ambulatory Visit (INDEPENDENT_AMBULATORY_CARE_PROVIDER_SITE_OTHER): Payer: Medicare HMO | Admitting: Family Medicine

## 2017-01-05 DIAGNOSIS — N183 Chronic kidney disease, stage 3 unspecified: Secondary | ICD-10-CM

## 2017-01-05 DIAGNOSIS — E785 Hyperlipidemia, unspecified: Secondary | ICD-10-CM

## 2017-01-05 DIAGNOSIS — Z0000 Encounter for general adult medical examination without abnormal findings: Secondary | ICD-10-CM | POA: Diagnosis not present

## 2017-01-05 DIAGNOSIS — E039 Hypothyroidism, unspecified: Secondary | ICD-10-CM | POA: Diagnosis not present

## 2017-01-05 DIAGNOSIS — D473 Essential (hemorrhagic) thrombocythemia: Secondary | ICD-10-CM

## 2017-01-05 LAB — TSH: TSH: 1.3 m[IU]/L (ref 0.40–4.50)

## 2017-01-05 LAB — LIPID PANEL
CHOLESTEROL: 148 mg/dL (ref <200)
HDL: 61 mg/dL (ref >50)
LDL Cholesterol (Calc): 64 mg/dL (calc)
Non-HDL Cholesterol (Calc): 87 mg/dL (calc) (ref <130)
Total CHOL/HDL Ratio: 2.4 (calc) (ref <5.0)
Triglycerides: 142 mg/dL (ref <150)

## 2017-01-05 NOTE — Assessment & Plan Note (Signed)
Check fastintg lipids today

## 2017-01-05 NOTE — Assessment & Plan Note (Signed)
Checked by Dr. Mike Gip

## 2017-01-05 NOTE — Patient Instructions (Addendum)
You can ask your pharmacist for the new shingles vaccine (Shingrix)  Health Maintenance  Topic Date Due  . PNA vac Low Risk Adult (2 of 2 - PPSV23) 12/30/2016  . DEXA SCAN  01/05/2018 (Originally 06/22/1993)  . TETANUS/TDAP  02/23/2018 (Originally 06/23/1945)  . MAMMOGRAM  04/23/2017  . INFLUENZA VACCINE  Completed    Health Maintenance for Postmenopausal Women Menopause is a normal process in which your reproductive ability comes to an end. This process happens gradually over a span of months to years, usually between the ages of 86 and 58. Menopause is complete when you have missed 12 consecutive menstrual periods. It is important to talk with your health care provider about some of the most common conditions that affect postmenopausal women, such as heart disease, cancer, and bone loss (osteoporosis). Adopting a healthy lifestyle and getting preventive care can help to promote your health and wellness. Those actions can also lower your chances of developing some of these common conditions. What should I know about menopause? During menopause, you may experience a number of symptoms, such as:  Moderate-to-severe hot flashes.  Night sweats.  Decrease in sex drive.  Mood swings.  Headaches.  Tiredness.  Irritability.  Memory problems.  Insomnia.  Choosing to treat or not to treat menopausal changes is an individual decision that you make with your health care provider. What should I know about hormone replacement therapy and supplements? Hormone therapy products are effective for treating symptoms that are associated with menopause, such as hot flashes and night sweats. Hormone replacement carries certain risks, especially as you become older. If you are thinking about using estrogen or estrogen with progestin treatments, discuss the benefits and risks with your health care provider. What should I know about heart disease and stroke? Heart disease, heart attack, and stroke become  more likely as you age. This may be due, in part, to the hormonal changes that your body experiences during menopause. These can affect how your body processes dietary fats, triglycerides, and cholesterol. Heart attack and stroke are both medical emergencies. There are many things that you can do to help prevent heart disease and stroke:  Have your blood pressure checked at least every 1-2 years. High blood pressure causes heart disease and increases the risk of stroke.  If you are 59-74 years old, ask your health care provider if you should take aspirin to prevent a heart attack or a stroke.  Do not use any tobacco products, including cigarettes, chewing tobacco, or electronic cigarettes. If you need help quitting, ask your health care provider.  It is important to eat a healthy diet and maintain a healthy weight. ? Be sure to include plenty of vegetables, fruits, low-fat dairy products, and lean protein. ? Avoid eating foods that are high in solid fats, added sugars, or salt (sodium).  Get regular exercise. This is one of the most important things that you can do for your health. ? Try to exercise for at least 150 minutes each week. The type of exercise that you do should increase your heart rate and make you sweat. This is known as moderate-intensity exercise. ? Try to do strengthening exercises at least twice each week. Do these in addition to the moderate-intensity exercise.  Know your numbers.Ask your health care provider to check your cholesterol and your blood glucose. Continue to have your blood tested as directed by your health care provider.  What should I know about cancer screening? There are several types of cancer. Take  the following steps to reduce your risk and to catch any cancer development as early as possible. Breast Cancer  Practice breast self-awareness. ? This means understanding how your breasts normally appear and feel. ? It also means doing regular breast  self-exams. Let your health care provider know about any changes, no matter how small.  If you are 41 or older, have a clinician do a breast exam (clinical breast exam or CBE) every year. Depending on your age, family history, and medical history, it may be recommended that you also have a yearly breast X-ray (mammogram).  If you have a family history of breast cancer, talk with your health care provider about genetic screening.  If you are at high risk for breast cancer, talk with your health care provider about having an MRI and a mammogram every year.  Breast cancer (BRCA) gene test is recommended for women who have family members with BRCA-related cancers. Results of the assessment will determine the need for genetic counseling and BRCA1 and for BRCA2 testing. BRCA-related cancers include these types: ? Breast. This occurs in males or females. ? Ovarian. ? Tubal. This may also be called fallopian tube cancer. ? Cancer of the abdominal or pelvic lining (peritoneal cancer). ? Prostate. ? Pancreatic.  Cervical, Uterine, and Ovarian Cancer Your health care provider may recommend that you be screened regularly for cancer of the pelvic organs. These include your ovaries, uterus, and vagina. This screening involves a pelvic exam, which includes checking for microscopic changes to the surface of your cervix (Pap test).  For women ages 21-65, health care providers may recommend a pelvic exam and a Pap test every three years. For women ages 81-65, they may recommend the Pap test and pelvic exam, combined with testing for human papilloma virus (HPV), every five years. Some types of HPV increase your risk of cervical cancer. Testing for HPV may also be done on women of any age who have unclear Pap test results.  Other health care providers may not recommend any screening for nonpregnant women who are considered low risk for pelvic cancer and have no symptoms. Ask your health care provider if a screening  pelvic exam is right for you.  If you have had past treatment for cervical cancer or a condition that could lead to cancer, you need Pap tests and screening for cancer for at least 20 years after your treatment. If Pap tests have been discontinued for you, your risk factors (such as having a new sexual partner) need to be reassessed to determine if you should start having screenings again. Some women have medical problems that increase the chance of getting cervical cancer. In these cases, your health care provider may recommend that you have screening and Pap tests more often.  If you have a family history of uterine cancer or ovarian cancer, talk with your health care provider about genetic screening.  If you have vaginal bleeding after reaching menopause, tell your health care provider.  There are currently no reliable tests available to screen for ovarian cancer.  Lung Cancer Lung cancer screening is recommended for adults 31-57 years old who are at high risk for lung cancer because of a history of smoking. A yearly low-dose CT scan of the lungs is recommended if you:  Currently smoke.  Have a history of at least 30 pack-years of smoking and you currently smoke or have quit within the past 15 years. A pack-year is smoking an average of one pack of cigarettes per  day for one year.  Yearly screening should:  Continue until it has been 15 years since you quit.  Stop if you develop a health problem that would prevent you from having lung cancer treatment.  Colorectal Cancer  This type of cancer can be detected and can often be prevented.  Routine colorectal cancer screening usually begins at age 8 and continues through age 52.  If you have risk factors for colon cancer, your health care provider may recommend that you be screened at an earlier age.  If you have a family history of colorectal cancer, talk with your health care provider about genetic screening.  Your health care  provider may also recommend using home test kits to check for hidden blood in your stool.  A small camera at the end of a tube can be used to examine your colon directly (sigmoidoscopy or colonoscopy). This is done to check for the earliest forms of colorectal cancer.  Direct examination of the colon should be repeated every 5-10 years until age 26. However, if early forms of precancerous polyps or small growths are found or if you have a family history or genetic risk for colorectal cancer, you may need to be screened more often.  Skin Cancer  Check your skin from head to toe regularly.  Monitor any moles. Be sure to tell your health care provider: ? About any new moles or changes in moles, especially if there is a change in a mole's shape or color. ? If you have a mole that is larger than the size of a pencil eraser.  If any of your family members has a history of skin cancer, especially at a young age, talk with your health care provider about genetic screening.  Always use sunscreen. Apply sunscreen liberally and repeatedly throughout the day.  Whenever you are outside, protect yourself by wearing long sleeves, pants, a wide-brimmed hat, and sunglasses.  What should I know about osteoporosis? Osteoporosis is a condition in which bone destruction happens more quickly than new bone creation. After menopause, you may be at an increased risk for osteoporosis. To help prevent osteoporosis or the bone fractures that can happen because of osteoporosis, the following is recommended:  If you are 51-47 years old, get at least 1,000 mg of calcium and at least 600 mg of vitamin D per day.  If you are older than age 54 but younger than age 61, get at least 1,200 mg of calcium and at least 600 mg of vitamin D per day.  If you are older than age 61, get at least 1,200 mg of calcium and at least 800 mg of vitamin D per day.  Smoking and excessive alcohol intake increase the risk of osteoporosis. Eat  foods that are rich in calcium and vitamin D, and do weight-bearing exercises several times each week as directed by your health care provider. What should I know about how menopause affects my mental health? Depression may occur at any age, but it is more common as you become older. Common symptoms of depression include:  Low or sad mood.  Changes in sleep patterns.  Changes in appetite or eating patterns.  Feeling an overall lack of motivation or enjoyment of activities that you previously enjoyed.  Frequent crying spells.  Talk with your health care provider if you think that you are experiencing depression. What should I know about immunizations? It is important that you get and maintain your immunizations. These include:  Tetanus, diphtheria, and pertussis (  Tdap) booster vaccine.  Influenza every year before the flu season begins.  Pneumonia vaccine.  Shingles vaccine.  Your health care provider may also recommend other immunizations. This information is not intended to replace advice given to you by your health care provider. Make sure you discuss any questions you have with your health care provider. Document Released: 04/03/2005 Document Revised: 08/30/2015 Document Reviewed: 11/13/2014 Elsevier Interactive Patient Education  2018 [employer].

## 2017-01-05 NOTE — Progress Notes (Signed)
Patient: Gina [name], Female    DOB: 09-Aug-1926, 83 y.o.   MRN: [id number]  Visit Date: 01/12/2017  Today's Provider: Enid Derry, MD   Chief Complaint  Patient presents with  . Medicare Wellness    Subjective:   Gina [name] is a 83 y.o. female who presents today for her Subsequent Annual Wellness Visit.  Caregiver input:  N/a Since last visit, platelets were 407; taking low dose of her hydroxyurea Mon-Wed-Fri She saw the kidney doctor last week  USPSTF grade A and B recommendations Depression:  Depression screen [facility] 2/9 01/05/2017 07/29/2016 06/30/2016 12/31/2015 12/19/2014  Decreased Interest 0 0 0 0 0  Down, Depressed, Hopeless 0 1 1 0 0  PHQ - 2 Score 0 1 1 0 0   Hypertension: BP Readings from Last 3 Encounters:  01/05/17 124/78  12/03/16 129/74  10/21/16 130/80   Obesity: Wt Readings from Last 3 Encounters:  01/05/17 143 lb 11.2 oz (65.2 kg)  12/03/16 145 lb 5 oz (65.9 kg)  10/21/16 143 lb (64.9 kg)   BMI Readings from Last 3 Encounters:  01/05/17 24.67 kg/m  12/03/16 24.94 kg/m  10/21/16 24.55 kg/m    Skin cancer: sees dermatologist Lung cancer:  n/a Breast cancer: April 23, 2016; next due April 23, 2017 Colorectal cancer: aged out Cervical cancer screening: n/a Osteoporosis: declined the DEXA scan Fall prevention/vitamin D: discussed  Diet: good eater Exercise: active Alcohol: no Tobacco use: quit years ago Aspirin: taking daily for thrombocytosis Lipids:  Lab Results  Component Value Date   CHOL 148 01/05/2017   CHOL 237 (H) 08/27/2016   CHOL 174 02/28/2016   Lab Results  Component Value Date   HDL 61 01/05/2017   HDL 51 08/27/2016   HDL 60 02/28/2016   Lab Results  Component Value Date   LDLCALC 155 (H) 08/27/2016   LDLCALC 78 02/28/2016   LDLCALC 80 09/02/2015   Lab Results  Component Value Date   TRIG 142 01/05/2017   TRIG 154 (H) 08/27/2016   TRIG 181 (H) 02/28/2016   Lab Results  Component Value Date   CHOLHDL 2.4  01/05/2017   CHOLHDL 4.6 08/27/2016   CHOLHDL 2.9 02/28/2016   No results found for: LDLDIRECT Glucose:  Glucose  Date Value Ref Range Status  06/01/2014 153 (H) mg/dL Final    Comment:    65-99 NOTE: New Reference Range  05/01/14    Glucose, Bld  Date Value Ref Range Status  12/03/2016 109 (H) 65 - 99 mg/dL Final  08/31/2016 124 (H) 65 - 99 mg/dL Final  06/01/2016 101 (H) 65 - 99 mg/dL Final   HPI  Review of Systems  Past Medical History:  Diagnosis Date  . Breast mass in female    right breast  . Cancer ([place])    SKIN  . Cataract   . Dermatitis   . Difficult intubation 03/03/2016   September 06, 2013; left nasal fiberoptic intubation #7 ETT; see letter from Dr. Loanne Drilling, Dept of Anesthesiology[facility]  . Diverticulitis   . Diverticulosis 05/28/2015  . Essential thrombocytosis ([place]) 07/23/2015  . Genital prolapse   . GERD (gastroesophageal reflux disease)   . 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 Mike Gip  . Headache   . Hyperlipemia   . Hyperlipidemia    history of   . Hypertension   . Hypothyroidism   . Hypothyroidism   . Mild atherosclerosis of carotid artery 06/25/2015  . MRI contraindicated due to metal implant   .  Prolapse of uterus   . Radiculopathy   . Renal cyst   . Renal cyst   . Rosacea   . Shingles   . Thrombocytosis ([place])   . Thyroid disease     Past Surgical History:  Procedure Laterality Date  . ADENOIDECTOMY    . APPENDECTOMY    . biopsy, right breast     . CATARACT EXTRACTION W/PHACO Left 03/25/2015   Procedure: CATARACT EXTRACTION PHACO AND INTRAOCULAR LENS PLACEMENT (IOC);  Surgeon: Estill Cotta, MD;  Location: [facility] ORS;  Service: Ophthalmology;  Laterality: Left;  [place] 02:09 AP% 27.3 CDE 59.97 fluid pack lot #[id number]  . CESAREAN SECTION    . COLON SURGERY    . JOINT REPLACEMENT Right    total hip  . THYROID LOBECTOMY    . THYROIDECTOMY     partial  . TONSILLECTOMY    . TOTAL HIP ARTHROPLASTY    . UPPER GI ENDOSCOPY   10/17/2008    Family History  Problem Relation Age of Onset  . Stroke Mother   . Stroke Father   . Dementia Maternal Grandfather    Social History   Tobacco Use  . Smoking status: Former Research scientist (life sciences)  . Smokeless tobacco: Never Used  Substance Use Topics  . Alcohol use: No    Alcohol/week: 0.0 oz  . Drug use: No    Outpatient Encounter Medications as of 01/05/2017  Medication Sig Note  . amLODipine (NORVASC) 5 MG tablet Take 1 tablet (5 mg total) by mouth daily.   Marland Kitchen aspirin EC 81 MG tablet Take 81 mg by mouth 2 (two) times daily.    Marland Kitchen atorvastatin (LIPITOR) 20 MG tablet Take 1 tablet (20 mg total) by mouth daily.   . Cholecalciferol (VITAMIN D3) 1000 units CAPS Take by mouth daily.  12/31/2015: Received from: [place]: Take 1,000 Units by mouth daily.  . hydroxyurea (HYDREA) 500 MG capsule TAKE ONE CAPSULE BY MOUTH ON MONDAY, WEDNESDAY AND FRIDAY. MAY TAKE WITH FOOD TO MINIMIZE GI SIDE EFFECTS.   Marland Kitchen ketoconazole (NIZORAL) 2 % shampoo Apply 1 application topically 2 (two) times a week.  06/24/2015: Received from: External Pharmacy  . levothyroxine (SYNTHROID, LEVOTHROID) 88 MCG tablet Take 1 tablet (88 mcg total) by mouth daily before breakfast.   . ranitidine (ZANTAC) 150 MG tablet Take 150 mg by mouth.   . rosuvastatin (CRESTOR) 20 MG tablet Take 20 mg by mouth.   . cetirizine HCl (CETIRIZINE HCL CHILDRENS ALRGY) 5 MG/5ML SOLN Take 5 mg daily by mouth.    . diclofenac sodium (VOLTAREN) 1 % GEL Apply 2-4 grams over the knee(s) four times a day if needed for pain (Patient not taking: Reported on 01/05/2017)   . irbesartan (AVAPRO) 150 MG tablet Take 1 tablet (150 mg total) by mouth daily.   . pantoprazole (PROTONIX) 20 MG tablet Take 1 tablet (20 mg total) by mouth daily. Or three days a week if tolerated    No facility-administered encounter medications on file as of 01/05/2017.     Functional Ability / Safety Screening 1.  Was the timed Get Up and Go test longer  than 12 seconds?  no 2.  Does the patient need help with the phone, transpor2tation, shopping,      preparing meals, housework, laundry, medications, or managing money?  yes 3.  Does the patient's home have:  loose throw rugs in the hallway?   no      Grab bars in the bathroom? yes  Handrails on the stairs?   no  Stairs      Poor lighting?   no 4.  Has the patient noticed any hearing difficulties?   no  Fall Risk Assessment See under rooming  Depression Screen See under rooming Depression screen [facility] 2/9 01/05/2017 07/29/2016 06/30/2016 12/31/2015 12/19/2014  Decreased Interest 0 0 0 0 0  Down, Depressed, Hopeless 0 1 1 0 0  PHQ - 2 Score 0 1 1 0 0    Advanced Directives Does patient have a HCPOA?    yes If yes, name and contact information: husband, Neal Dy, same home phone Does patient have a living will or MOST form?  yes  DNR  Objective:   Vitals: BP 124/78 (BP Location: Left Arm, Patient Position: Sitting, Cuff Size: Normal)   Pulse 81   Ht 5\' 4"  (1.626 m)   Wt 143 lb 11.2 oz (65.2 kg)   SpO2 98%   BMI 24.67 kg/m  Body mass index is 24.67 kg/m. No exam data present  Physical Exam Mood/affect:  Very pleasant, euthymic Appearance:  Neatly dressed, good hygiene  No flowsheet data found.  Assessment & Plan:     Annual Wellness Visit  Reviewed patient's Family Medical History Reviewed and updated list of patient's medical providers Assessment of cognitive impairment was done Assessed patient's functional ability Established a written schedule for health screening [place] Completed and Reviewed   Immunization History  Administered Date(s) Administered  . Influenza, High Dose Seasonal PF 11/23/2016  . Influenza-Unspecified 11/06/2015  . Pneumococcal Conjugate-13 12/31/2015    Health Maintenance  Topic Date Due  . PNA vac Low Risk Adult (2 of 2 - PPSV23) 12/30/2016  . DEXA SCAN  01/05/2018 (Originally 06/22/1993)  . TETANUS/TDAP   02/23/2018 (Originally 06/23/1947)  . MAMMOGRAM  04/23/2017  . INFLUENZA VACCINE  Completed    Discussed health benefits of physical activity, and encouraged her to engage in regular exercise appropriate for her age and condition.   Meds ordered this encounter  Medications  . ranitidine (ZANTAC) 150 MG tablet    Sig: Take 150 mg by mouth.  . rosuvastatin (CRESTOR) 20 MG tablet    Sig: Take 20 mg by mouth.    Current Outpatient Medications:  .  amLODipine (NORVASC) 5 MG tablet, Take 1 tablet (5 mg total) by mouth daily., Disp: 90 tablet, Rfl: 3 .  aspirin EC 81 MG tablet, Take 81 mg by mouth 2 (two) times daily. , Disp: , Rfl:  .  atorvastatin (LIPITOR) 20 MG tablet, Take 1 tablet (20 mg total) by mouth daily., Disp: 90 tablet, Rfl: 1 .  Cholecalciferol (VITAMIN D3) 1000 units CAPS, Take by mouth daily. , Disp: , Rfl:  .  hydroxyurea (HYDREA) 500 MG capsule, TAKE ONE CAPSULE BY MOUTH ON MONDAY, WEDNESDAY AND FRIDAY. MAY TAKE WITH FOOD TO MINIMIZE GI SIDE EFFECTS., Disp: 30 capsule, Rfl: 3 .  ketoconazole (NIZORAL) 2 % shampoo, Apply 1 application topically 2 (two) times a week. , Disp: , Rfl:  .  levothyroxine (SYNTHROID, LEVOTHROID) 88 MCG tablet, Take 1 tablet (88 mcg total) by mouth daily before breakfast., Disp: 90 tablet, Rfl: 3 .  ranitidine (ZANTAC) 150 MG tablet, Take 150 mg by mouth., Disp: , Rfl:  .  rosuvastatin (CRESTOR) 20 MG tablet, Take 20 mg by mouth., Disp: , Rfl:  .  cetirizine HCl (CETIRIZINE HCL CHILDRENS ALRGY) 5 MG/5ML SOLN, Take 5 mg daily by mouth. , Disp: , Rfl:  .  diclofenac sodium (VOLTAREN) 1 % GEL, Apply 2-4 grams over the knee(s) four times a day if needed for pain (Patient not taking: Reported on 01/05/2017), Disp: 100 g, Rfl: 2 .  irbesartan (AVAPRO) 150 MG tablet, Take 1 tablet (150 mg total) by mouth daily., Disp: 90 tablet, Rfl: 5 .  pantoprazole (PROTONIX) 20 MG tablet, Take 1 tablet (20 mg total) by mouth daily. Or three days a week if tolerated, Disp:  30 tablet, Rfl: 5 There are no discontinued medications.  Next Medicare Wellness Visit in 12+ months  Problem List Items Addressed This Visit      Endocrine   Hypothyroidism, adult    Check TSH      Relevant Orders   TSH (Completed)     Genitourinary   CKD (chronic kidney disease), stage III [facility])    Seeing nephrologist        Hematopoietic and Hemostatic   Essential thrombocytosis ([place]) (Chronic)    Checked by Dr. Mike Gip        Other   Dyslipidemia    Check fastintg lipids today      Relevant Medications   rosuvastatin (CRESTOR) 20 MG tablet   Other Relevant Orders   Lipid panel (Completed)

## 2017-01-05 NOTE — Assessment & Plan Note (Signed)
Check TSH 

## 2017-01-05 NOTE — Assessment & Plan Note (Signed)
Seeing nephrologist 

## 2017-01-12 DIAGNOSIS — Z0000 Encounter for general adult medical examination without abnormal findings: Secondary | ICD-10-CM | POA: Insufficient documentation

## 2017-01-12 NOTE — Assessment & Plan Note (Signed)
USPSTF grade A and B recommendations reviewed with patient; age-appropriate recommendations, preventive care, screening tests, etc discussed and encouraged; healthy living encouraged; see AVS for patient education given to patient  

## 2017-01-20 ENCOUNTER — Telehealth: Payer: Self-pay | Admitting: Family Medicine

## 2017-01-20 NOTE — Telephone Encounter (Signed)
Copied from [place] [id number]. Topic: Quick Communication - See Telephone Encounter >> Jan 20, 2017  9:11 AM Bea Graff, NT wrote: CRM for notification. See Telephone encounter for: Parks Ranger a nurse from [facility] calling with a question regarding atorvasatin 20mg  was not filled in October or November and she wants to know if this medication has been discontinued for this pt. CB#: [phone]   01/20/17.

## 2017-01-20 NOTE — Telephone Encounter (Signed)
I have reviewed chart and do not see documentation that this pt was to d/c her atorvastatin. Can you confirm or deny this? Thanks!

## 2017-01-20 NOTE — Telephone Encounter (Signed)
Called pt to clarify medications, pt states that she is getting a 90 day supply of atorvastatin and is taking it as prescribed. Pt states that she is not taking rosuvastatin.   Called Parks Ranger informed her that pt is taking medication and is getting 90 day supply.

## 2017-01-20 NOTE — Telephone Encounter (Signed)
Please check with patient It looks like she is getting 90 day supplies, so it would not be unusual to not fill a Rx every single month She has two statins in her med list Find out which she is using, remove the other and contact Malachy Mood Thank you

## 2017-01-21 ENCOUNTER — Encounter: Payer: Self-pay | Admitting: Obstetrics & Gynecology

## 2017-01-21 ENCOUNTER — Ambulatory Visit: Payer: Medicare HMO | Admitting: Obstetrics & Gynecology

## 2017-01-21 VITALS — BP 120/70 | HR 74 | Ht 63.0 in | Wt 145.0 lb

## 2017-01-21 DIAGNOSIS — N814 Uterovaginal prolapse, unspecified: Secondary | ICD-10-CM | POA: Diagnosis not present

## 2017-01-21 NOTE — Patient Instructions (Signed)
Pelvic Organ Prolapse Pelvic organ prolapse is the stretching, bulging, or dropping of pelvic organs into an abnormal position. It happens when the muscles and tissues that surround and support pelvic structures are stretched or weak. Pelvic organ prolapse can involve:  Vagina (vaginal prolapse).  Uterus (uterine prolapse).  Bladder (cystocele).  Rectum (rectocele).  Intestines (enterocele).  When organs other than the vagina are involved, they often bulge into the vagina or protrude from the vagina, depending on how severe the prolapse is. What are the causes? Causes of this condition include:  Pregnancy, labor, and childbirth.  Long-lasting (chronic) cough.  Chronic constipation.  Obesity.  Past pelvic surgery.  Aging. During and after menopause, a decreased production of the hormone estrogen can weaken pelvic ligaments and muscles.  Consistently lifting more than 50 lb (23 kg).  Buildup of fluid in the abdomen due to certain diseases and other conditions.  What are the signs or symptoms? Symptoms of this condition include:  Loss of bladder control when you cough, sneeze, strain, and exercise (stress incontinence). This may be worse immediately following childbirth, and it may gradually improve over time.  Feeling pressure in your pelvis or vagina. This pressure may increase when you cough or when you are having a bowel movement.  A bulge that protrudes from the opening of your vagina or against your vaginal wall. If your uterus protrudes through the opening of your vagina and rubs against your clothing, you may also experience soreness, ulcers, infection, pain, and bleeding.  Increased effort to have a bowel movement or urinate.  Pain in your low back.  Pain, discomfort, or disinterest in sexual intercourse.  Repeated bladder infections (urinary tract infections).  Difficulty inserting or inability to insert a tampon or applicator.  In some people, this  condition does not cause any symptoms. How is this diagnosed? Your health care provider may perform an internal and external vaginal and rectal exam. During the exam, you may be asked to cough and strain while you are lying down, sitting, and standing up. Your health care provider will determine if other tests are required, such as bladder function tests. How is this treated? In most cases, this condition needs to be treated only if it produces symptoms. No treatment is guaranteed to correct the prolapse or relieve the symptoms completely. Treatment may include:  Lifestyle changes, such as: ? Avoiding drinking beverages that contain caffeine. ? Increasing your intake of high-fiber foods. This can help to decrease constipation and straining during bowel movements. ? Emptying your bladder at scheduled times (bladder training therapy). This can help to reduce or avoid urinary incontinence. ? Losing weight if you are overweight or obese.  Estrogen. Estrogen may help mild prolapse by increasing the strength and tone of pelvic floor muscles.  Kegel exercises. These may help mild cases of prolapse by strengthening and tightening the muscles of the pelvic floor.  Pessary insertion. A pessary is a soft, flexible device that is placed into your vagina by your health care provider to help support the vaginal walls and keep pelvic organs in place.  Surgery. This is often the only form of treatment for severe prolapse. Different types of surgeries are available.  Follow these instructions at home:  Wear a sanitary pad or absorbent product if you have urinary incontinence.  Avoid heavy lifting and straining with exercise and work. Do not hold your breath when you perform mild to moderate lifting and exercise activities. Limit your activities as directed by your health care   provider.  Take medicines only as directed by your health care provider.  Perform Kegel exercises as directed by your health care  provider.  If you have a pessary, take care of it as directed by your health care provider. Contact a health care provider if:  Your symptoms interfere with your daily activities or sex life.  You need medicine to help with the discomfort.  You notice bleeding from the vagina that is not related to your period.  You have a fever.  You have pain or bleeding when you urinate.  You have bleeding when you have a bowel movement.  You lose urine when you have sex.  You have chronic constipation.  You have a pessary that falls out.  You have vaginal discharge that has a bad smell.  You have low abdominal pain or cramping that is unusual for you. This information is not intended to replace advice given to you by your health care provider. Make sure you discuss any questions you have with your health care provider. Document Released: 09/06/2013 Document Revised: 07/18/2015 Document Reviewed: 04/24/2013 Elsevier Interactive Patient Education  2018 [employer]  

## 2017-01-21 NOTE — Progress Notes (Signed)
  HPI:      [name] is a 83 y.o. [phone] who presents today for her pessary follow up and examination related to her pelvic floor weakening.  Pt reports tolerating the pessary well with  no vaginal bleeding and  no vaginal discharge.  Symptoms of pelvic floor weakening have greatly improved. She is voiding and defecating without difficulty. She currently has a #3 ring pessary.  PMHx: She  has a past medical history of Breast mass in female, Cancer [facility]), Cataract, Dermatitis, Difficult intubation (03/03/2016), Diverticulitis, Diverticulosis (05/28/2015), Essential thrombocytosis ([place]) (07/23/2015), Genital prolapse, GERD (gastroesophageal reflux disease), Rosanna Randy syndrome (06/30/2016), Headache, Hyperlipemia, Hyperlipidemia, Hypertension, Hypothyroidism, Hypothyroidism, Mild atherosclerosis of carotid artery (06/25/2015), MRI contraindicated due to metal implant, Prolapse of uterus, Radiculopathy, Renal cyst, Renal cyst, Rosacea, Shingles, Thrombocytosis ([place]), and Thyroid disease. Also,  has a past surgical history that includes Tonsillectomy; Appendectomy; Cesarean section; Joint replacement (Right); Thyroidectomy; Colon surgery; Cataract extraction w/PHACO (Left, 03/25/2015); Adenoidectomy; biopsy, right breast ; Total hip arthroplasty; Thyroid lobectomy; and Upper gi endoscopy (10/17/2008)., family history includes Dementia in her maternal grandfather; Stroke in her father and mother.,  reports that she has quit smoking. she has never used smokeless tobacco. She reports that she does not drink alcohol or use drugs.  She has a current medication list which includes the following prescription(s): amlodipine, aspirin ec, atorvastatin, cetirizine hcl, vitamin d3, diclofenac sodium, hydroxyurea, irbesartan, ketoconazole, levothyroxine, pantoprazole, and ranitidine. Also, is allergic to novocain [procaine].  Review of Systems  All other systems reviewed and are negative.   Objective: BP 120/70   Pulse 74    Ht 5\' 3"  (1.6 m)   Wt 145 lb (65.8 kg)   BMI 25.69 kg/m  Physical Exam  Constitutional: She is oriented to person, place, and time. She appears well-developed and well-nourished. No distress.  Genitourinary: Vagina normal and uterus normal. Pelvic exam was performed with patient supine. There is no rash, tenderness or lesion on the right labia. There is no rash, tenderness or lesion on the left labia. No erythema or bleeding in the vagina. Right adnexum does not display mass and does not display tenderness. Left adnexum does not display mass and does not display tenderness. Cervix does not exhibit motion tenderness, discharge, polyp or nabothian cyst.   Uterus is mobile and midaxial. Uterus is not enlarged or exhibiting a mass.  Genitourinary Comments: Stage 3 prolapse and cystocele present   Abdominal: Soft. She exhibits no distension. There is no tenderness.  Musculoskeletal: Normal range of motion.  Neurological: She is alert and oriented to person, place, and time. No cranial nerve deficit.  Skin: Skin is warm and dry.  Psychiatric: She has a normal mood and affect.   Pessary Care Pessary removed and cleaned.  Vagina checked - without erosions - pessary replaced.  A/P: Pessary was cleaned and replaced today. Instructions given for care. Concerning symptoms to observe for are counseled to patient. Follow up scheduled for 3 months.  Barnett Applebaum, MD, [facility], [facility] 01/21/2017  10:22 AM

## 2017-01-23 DIAGNOSIS — D479 Neoplasm of uncertain behavior of lymphoid, hematopoietic and related tissue, unspecified: Secondary | ICD-10-CM | POA: Diagnosis not present

## 2017-01-23 DIAGNOSIS — I129 Hypertensive chronic kidney disease with stage 1 through stage 4 chronic kidney disease, or unspecified chronic kidney disease: Secondary | ICD-10-CM | POA: Diagnosis not present

## 2017-01-23 DIAGNOSIS — N814 Uterovaginal prolapse, unspecified: Secondary | ICD-10-CM | POA: Diagnosis not present

## 2017-01-23 DIAGNOSIS — E039 Hypothyroidism, unspecified: Secondary | ICD-10-CM | POA: Diagnosis not present

## 2017-01-23 DIAGNOSIS — Z6824 Body mass index (BMI) 24.0-24.9, adult: Secondary | ICD-10-CM | POA: Diagnosis not present

## 2017-01-23 DIAGNOSIS — N189 Chronic kidney disease, unspecified: Secondary | ICD-10-CM | POA: Diagnosis not present

## 2017-01-23 DIAGNOSIS — Z0000 Encounter for general adult medical examination without abnormal findings: Secondary | ICD-10-CM | POA: Diagnosis not present

## 2017-01-23 DIAGNOSIS — Z85828 Personal history of other malignant neoplasm of skin: Secondary | ICD-10-CM | POA: Diagnosis not present

## 2017-01-23 DIAGNOSIS — Z87891 Personal history of nicotine dependence: Secondary | ICD-10-CM | POA: Diagnosis not present

## 2017-01-23 DIAGNOSIS — E785 Hyperlipidemia, unspecified: Secondary | ICD-10-CM | POA: Diagnosis not present

## 2017-02-19 DIAGNOSIS — L720 Epidermal cyst: Secondary | ICD-10-CM | POA: Diagnosis not present

## 2017-02-19 DIAGNOSIS — L814 Other melanin hyperpigmentation: Secondary | ICD-10-CM | POA: Diagnosis not present

## 2017-02-19 DIAGNOSIS — D1800 Hemangioma unspecified site: Secondary | ICD-10-CM | POA: Diagnosis not present

## 2017-02-19 DIAGNOSIS — L820 Inflamed seborrheic keratosis: Secondary | ICD-10-CM | POA: Diagnosis not present

## 2017-02-19 DIAGNOSIS — Z85828 Personal history of other malignant neoplasm of skin: Secondary | ICD-10-CM | POA: Diagnosis not present

## 2017-02-19 DIAGNOSIS — D229 Melanocytic nevi, unspecified: Secondary | ICD-10-CM | POA: Diagnosis not present

## 2017-02-19 DIAGNOSIS — L821 Other seborrheic keratosis: Secondary | ICD-10-CM | POA: Diagnosis not present

## 2017-02-19 DIAGNOSIS — B078 Other viral warts: Secondary | ICD-10-CM | POA: Diagnosis not present

## 2017-02-19 DIAGNOSIS — L578 Other skin changes due to chronic exposure to nonionizing radiation: Secondary | ICD-10-CM | POA: Diagnosis not present

## 2017-02-19 DIAGNOSIS — Z1283 Encounter for screening for malignant neoplasm of skin: Secondary | ICD-10-CM | POA: Diagnosis not present

## 2017-03-02 ENCOUNTER — Ambulatory Visit: Payer: Medicare HMO | Admitting: Hematology and Oncology

## 2017-03-02 ENCOUNTER — Inpatient Hospital Stay (HOSPITAL_BASED_OUTPATIENT_CLINIC_OR_DEPARTMENT_OTHER): Payer: Medicare HMO | Admitting: Urgent Care

## 2017-03-02 ENCOUNTER — Inpatient Hospital Stay: Payer: Medicare HMO | Attending: Hematology and Oncology

## 2017-03-02 VITALS — BP 122/76 | HR 72 | Temp 96.5°F | Resp 20 | Wt 144.3 lb

## 2017-03-02 DIAGNOSIS — K5909 Other constipation: Secondary | ICD-10-CM | POA: Diagnosis not present

## 2017-03-02 DIAGNOSIS — Z7982 Long term (current) use of aspirin: Secondary | ICD-10-CM

## 2017-03-02 DIAGNOSIS — R69 Illness, unspecified: Secondary | ICD-10-CM | POA: Diagnosis not present

## 2017-03-02 DIAGNOSIS — N289 Disorder of kidney and ureter, unspecified: Secondary | ICD-10-CM | POA: Diagnosis not present

## 2017-03-02 DIAGNOSIS — D473 Essential (hemorrhagic) thrombocythemia: Secondary | ICD-10-CM

## 2017-03-02 DIAGNOSIS — D75839 Thrombocytosis, unspecified: Secondary | ICD-10-CM

## 2017-03-02 LAB — COMPREHENSIVE METABOLIC PANEL
ALT: 16 U/L (ref 14–54)
AST: 22 U/L (ref 15–41)
Albumin: 4.4 g/dL (ref 3.5–5.0)
Alkaline Phosphatase: 83 U/L (ref 38–126)
Anion gap: 10 (ref 5–15)
BUN: 30 mg/dL — ABNORMAL HIGH (ref 6–20)
CO2: 24 mmol/L (ref 22–32)
Calcium: 9.5 mg/dL (ref 8.9–10.3)
Chloride: 102 mmol/L (ref 101–111)
Creatinine, Ser: 1.08 mg/dL — ABNORMAL HIGH (ref 0.44–1.00)
GFR calc Af Amer: 52 mL/min — ABNORMAL LOW (ref >60)
GFR calc non Af Amer: 44 mL/min — ABNORMAL LOW (ref >60)
Glucose, Bld: 102 mg/dL — ABNORMAL HIGH (ref 65–99)
Potassium: 4.7 mmol/L (ref 3.5–5.1)
Sodium: 136 mmol/L (ref 135–145)
Total Bilirubin: 1.2 mg/dL (ref 0.3–1.2)
Total Protein: 7.7 g/dL (ref 6.5–8.1)

## 2017-03-02 LAB — CBC WITH DIFFERENTIAL/PLATELET
Basophils Absolute: 0.1 10*3/uL (ref 0–0.1)
Basophils Relative: 1 %
Eosinophils Absolute: 0.2 10*3/uL (ref 0–0.7)
Eosinophils Relative: 3 %
HCT: 41.1 % (ref 35.0–47.0)
Hemoglobin: 13.9 g/dL (ref 12.0–16.0)
Lymphocytes Relative: 18 %
Lymphs Abs: 1.5 10*3/uL (ref 1.0–3.6)
MCH: 31.4 pg (ref 26.0–34.0)
MCHC: 33.7 g/dL (ref 32.0–36.0)
MCV: 93 fL (ref 80.0–100.0)
Monocytes Absolute: 0.7 10*3/uL (ref 0.2–0.9)
Monocytes Relative: 8 %
Neutro Abs: 5.8 10*3/uL (ref 1.4–6.5)
Neutrophils Relative %: 70 %
Platelets: 405 10*3/uL (ref 150–440)
RBC: 4.42 MIL/uL (ref 3.80–5.20)
RDW: 14.5 % (ref 11.5–14.5)
WBC: 8.3 10*3/uL (ref 3.6–11.0)

## 2017-03-02 NOTE — Progress Notes (Signed)
Patient offers no complaints today. 

## 2017-03-02 NOTE — Progress Notes (Signed)
[facility] day:  03/02/2017  Chief Complaint: Gina [name] is a 84 y.o. female with essential thrombocythemia (ET) who is seen for 3 month assessment.  HPI:  The patient was last seen in the medical oncology clinic on 12/03/2016.  At that time, patient was doing "wonderful". She has been seen in consult by GI for her reflux. Patient complained of chronic constipation, for which she was using Miralax. Exam was stable. WBC 7.8 with an [place] of 5500. Hemoglobin 12.9, hematocrit 38.6, and platelets 407,000. Patient continued her Hydroxyurea 578m on Monday, Wednesday, and Friday. She also continued aspirin twice a day.   In the interim, patient has been doing "very well". She denies acute physical complaints today. Patient denies bleeding; no hematochezia, melena, or vaginal bleeding. Patient has not appreciated an petechial rashes or areas of unexplained bruising.   Patient continues to eat well, and her weight is stable. Patient denies any B symptoms or interval infections. Patient continues her Hydrea and ASA as previously prescribed.   Past Medical History:  Diagnosis Date  . Breast mass in female    right breast  . Cancer ([place]    SKIN  . Cataract   . Dermatitis   . Difficult intubation 03/03/2016   September 06, 2013; left nasal fiberoptic intubation #7 ETT; see [facility]oanne Drilling [facility] . Diverticulitis   . Diverticulosis 05/28/2015  . Essential thrombocytosis ([place] 07/23/2015  . Genital prolapse   . GERD (gastroesophageal reflux disease)   . 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 CMike Gip . Headache   . Hyperlipemia   . Hyperlipidemia    history of   . Hypertension   . Hypothyroidism   . Hypothyroidism   . Mild atherosclerosis of carotid artery 06/25/2015  . MRI contraindicated due to metal implant   . Prolapse of uterus   . Radiculopathy   . Renal cyst   . Renal cyst   . Rosacea   . Shingles   .  Thrombocytosis ([place]   . Thyroid disease     Past Surgical History:  Procedure Laterality Date  . ADENOIDECTOMY    . APPENDECTOMY    . biopsy, right breast     . CATARACT EXTRACTION W/PHACO Left 03/25/2015   Procedure: CATARACT EXTRACTION PHACO AND INTRAOCULAR LENS PLACEMENT (IOC);  Surgeon: SEstill Cotta MD;  Location: [facility] ORS;  Service: Ophthalmology;  Laterality: Left;  [place]:09 AP% 27.3 CDE 59.97 fluid pack lot ##[id number]  . CESAREAN SECTION    . COLON SURGERY    . JOINT REPLACEMENT Right    total hip  . THYROID LOBECTOMY    . THYROIDECTOMY     partial  . TONSILLECTOMY    . TOTAL HIP ARTHROPLASTY    . UPPER GI ENDOSCOPY  10/17/2008    Family History  Problem Relation Age of Onset  . Stroke Mother   . Stroke Father   . Dementia Maternal Grandfather     Social History:  reports that she has quit smoking. she has never used smokeless tobacco. She reports that she does not drink alcohol or use drugs.  [place]  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  She will be 89 in 2 weeks.  She has 8 children (3 + 5 between  she and her husband).  Her husband is 17.  He has stage IV prostate cancer and is treated by Dr. Grayland Ormond.  The patient is alone today.  Allergies:  Allergies  Allergen Reactions  . Novocain [Procaine] Palpitations    Current Medications: Current Outpatient Medications  Medication Sig Dispense Refill  . amLODipine (NORVASC) 5 MG tablet Take 1 tablet (5 mg total) by mouth daily. 90 tablet 3  . aspirin EC 81 MG tablet Take 81 mg by mouth 2 (two) times daily.     Marland Kitchen atorvastatin (LIPITOR) 20 MG tablet Take 1 tablet (20 mg total) by mouth daily. 90 tablet 1  . cetirizine HCl (CETIRIZINE HCL CHILDRENS ALRGY) 5 MG/5ML SOLN Take 5 mg daily by mouth.     . Cholecalciferol (VITAMIN D3) 1000 units CAPS Take by mouth  daily.     . hydroxyurea (HYDREA) 500 MG capsule TAKE ONE CAPSULE BY MOUTH ON MONDAY, WEDNESDAY AND FRIDAY. MAY TAKE WITH FOOD TO MINIMIZE GI SIDE EFFECTS. 30 capsule 3  . irbesartan (AVAPRO) 150 MG tablet Take 1 tablet (150 mg total) by mouth daily. 90 tablet 5  . ketoconazole (NIZORAL) 2 % shampoo Apply 1 application topically 2 (two) times a week.     . levothyroxine (SYNTHROID, LEVOTHROID) 88 MCG tablet Take 1 tablet (88 mcg total) by mouth daily before breakfast. 90 tablet 3  . pantoprazole (PROTONIX) 20 MG tablet Take 1 tablet (20 mg total) by mouth daily. Or three days a week if tolerated 30 tablet 5  . ranitidine (ZANTAC) 150 MG tablet Take 150 mg by mouth.    . diclofenac sodium (VOLTAREN) 1 % GEL Apply 2-4 grams over the knee(s) four times a day if needed for pain (Patient not taking: Reported on 01/05/2017) 100 g 2   No current facility-administered medications for this visit.     Review of Systems:  GENERAL:  Feeling "wonderful".  No fevers or sweats.  Weight down 1 pound. PERFORMANCE STATUS (ECOG):  1 HEENT:  No visual changes, runny nose, sore throat, mouth sores or tenderness. Lungs: No shortness of breath or cough.  No hemoptysis. Cardiac:  No chest pain, palpitations, orthopnea, or PND. GI:   Chronic constipation; improved.  Reflux in check.  No diarrhea, constipation, melena or hematochezia. GU:  No urgency, frequency, dysuria, or hematuria. Musculoskeletal:  Charlie horse in calves, intermittent.  No back pain.  No joint pain.  No muscle tenderness. Extremities:  No pain or swelling. Skin:  No rashes or skin changes. Neuro:  No headache, numbness or weakness, balance or coordination issues. Endocrine:  No diabetes.  Thyroid issues disease on Synthroid.  No hot flashes or night sweats. Psych:  No mood changes, depression or anxiety. Pain:  No focal pain. Review of systems:  All other systems reviewed and found to be negative.  Physical Exam: Blood pressure 122/76,  pulse 72, temperature (!) 96.5 F (35.8 C), temperature source Tympanic, resp. rate 20, weight 144 lb 5 oz (65.5 kg), SpO2 99 %. GENERAL:  Well developed, well nourished, woman sitting comfortably in the exam room in no acute distress. MENTAL STATUS:  Alert and oriented to person, place and time. HEAD:  Short Maleek Craver hair.  Normocephalic, atraumatic, face symmetric, no Cushingoid features. EYES:  Brown eyes.  Pupils equal round and reactive to light and accomodation.  No conjunctivitis or scleral icterus. ENT:  Oropharynx clear without lesion.  Tongue normal. Mucous membranes moist.  RESPIRATORY:  Clear to auscultation without rales, wheezes or rhonchi.  CARDIOVASCULAR:  Regular rate and rhythm without murmur, rub or gallop. ABDOMEN:  Soft, non-tender, with active bowel sounds, and no hepatosplenomegaly.  No masses. SKIN:  No rashes, ulcers or lesions. EXTREMITIES: No edema, no skin discoloration or tenderness.  No palpable cords. LYMPH NODES: No palpable cervical, supraclavicular, axillary or inguinal adenopathy  NEUROLOGICAL: Unremarkable. PSYCH:  Appropriate.   Appointment on 03/02/2017  Component Date Value Ref Range Status  . Sodium 03/02/2017 136  135 - 145 mmol/L Final  . Potassium 03/02/2017 4.7  3.5 - 5.1 mmol/L Final  . Chloride 03/02/2017 102  101 - 111 mmol/L Final  . CO2 03/02/2017 24  22 - 32 mmol/L Final  . Glucose, Bld 03/02/2017 102* 65 - 99 mg/dL Final  . BUN 03/02/2017 30* 6 - 20 mg/dL Final  . Creatinine, Ser 03/02/2017 1.08* 0.44 - 1.00 mg/dL Final  . Calcium 03/02/2017 9.5  8.9 - 10.3 mg/dL Final  . Total Protein 03/02/2017 7.7  6.5 - 8.1 g/dL Final  . Albumin 03/02/2017 4.4  3.5 - 5.0 g/dL Final  . AST 03/02/2017 22  15 - 41 U/L Final  . ALT 03/02/2017 16  14 - 54 U/L Final  . Alkaline Phosphatase 03/02/2017 83  38 - 126 U/L Final  . Total Bilirubin 03/02/2017 1.2  0.3 - 1.2 mg/dL Final  . GFR calc non Af Amer 03/02/2017 44* >60 mL/min Final  . GFR calc Af Amer  03/02/2017 52* >60 mL/min Final   Comment: (NOTE) The eGFR has been calculated using the CKD EPI equation. This calculation has not been validated in all clinical situations. eGFR's persistently <60 mL/min signify possible Chronic Kidney Disease.   Georgiann Hahn gap 03/02/2017 10  5 - 15 Final   Performed at [facility], [address] [place], [place] [address]  . WBC 03/02/2017 8.3  3.6 - 11.0 K/uL Final  . RBC 03/02/2017 4.42  3.80 - 5.20 MIL/uL Final  . Hemoglobin 03/02/2017 13.9  12.0 - 16.0 g/dL Final  . HCT 03/02/2017 41.1  35.0 - 47.0 % Final  . MCV 03/02/2017 93.0  80.0 - 100.0 fL Final  . MCH 03/02/2017 31.4  26.0 - 34.0 pg Final  . MCHC 03/02/2017 33.7  32.0 - 36.0 g/dL Final  . RDW 03/02/2017 14.5  11.5 - 14.5 % Final  . Platelets 03/02/2017 405  150 - 440 K/uL Final  . Neutrophils Relative % 03/02/2017 70  % Final  . Neutro Abs 03/02/2017 5.8  1.4 - 6.5 K/uL Final  . Lymphocytes Relative 03/02/2017 18  % Final  . Lymphs Abs 03/02/2017 1.5  1.0 - 3.6 K/uL Final  . Monocytes Relative 03/02/2017 8  % Final  . Monocytes Absolute 03/02/2017 0.7  0.2 - 0.9 K/uL Final  . Eosinophils Relative 03/02/2017 3  % Final  . Eosinophils Absolute 03/02/2017 0.2  0 - 0.7 K/uL Final  . Basophils Relative 03/02/2017 1  % Final  . Basophils Absolute 03/02/2017 0.1  0 - 0.1 K/uL Final   Performed at [facility], [address], [place], [place] [address]    Assessment:  Veatrice Eckstein is a 84 y.o. female with essential thrombocythemia (ET) diagnosed on 06/24/2015.  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  MPL is negative.  She is on hydroxyurea 500 mg po every Monday, Wednesday and Friday.  She is on a baby aspirin BID.  She has mild renal insufficiency (CrCl 36 ml/min).  She has intermittent mild hyperbilirubinemia (? Gilbert's disease).  Bilirubin  has ranged between 1.2 -1.8.  Direct bilirubin was 0.1 on 06/01/2016.  Symptomatically, patient is feeling "wonderful". She verbalizes no acute  concerns today. Patient has chronic constipation, however this has improved significantly with the use of Miralax. GERD symptoms have also improved.  She is seeing GI.  Patient taking Hydroxyurea 535m on Monday, Wednesday, and Friday.  Exam is unremarkable.  Platelet count is 405,000.  Plan: 1.  Labs today: CBC with diff, CMP. 2.  Discussed platelet count of 405,000. Goal platelet count is < 400,000. Continue hydroxyurea 5059mevery Monday, Wednesday, Friday.  3.  Discussed aspirin therapy. Patient desires to reduce dose to once daily. She denies erythromelalgia. Will reduce aspirin to 81 mg once a day. 4.  Discussed visit frequency. Patient notes that her platelets have been stable and that Dr. CoMike Gipalways tells me things are great". Will discuss with Dr. CoMike Gipnd call patient on 03/03/2017. 5.  RTC in 3 months for MD assessment and labs (CBC with diff, CMP).  BrHonor LohNP  03/02/2017, 5:30 PM   ADDENDUM: Discussed patient with Dr. CoMike GipMD made aware that patient wanting to space out visit frequency given her platelet level stability. Dr. CoMike Gipk with spacing out visits, with interval labs in between. Patient to have labs (CBC with diff, CMP) every 3 months, and a RTC visit every 6 months for labs (CBC with diff, CMP) and provider assessment. Patient and scheduling made aware of changes to previous RTC plan of care.

## 2017-03-03 ENCOUNTER — Telehealth: Payer: Self-pay | Admitting: *Deleted

## 2017-03-03 NOTE — Telephone Encounter (Signed)
Called patient to ask if she had any redness in the palms of her hands prior to starting aspirin bid.  Patient states she did not.  Per Honor Loh, NP patient should only take one aspirin a day. She can rtc every 3 months for labs and every 6 months for lab/MD.  Patient verbalized understanding.

## 2017-03-04 ENCOUNTER — Other Ambulatory Visit: Payer: Medicare HMO

## 2017-03-04 ENCOUNTER — Ambulatory Visit: Payer: Medicare HMO | Admitting: Hematology and Oncology

## 2017-03-26 ENCOUNTER — Telehealth: Payer: Self-pay | Admitting: *Deleted

## 2017-03-26 DIAGNOSIS — D473 Essential (hemorrhagic) thrombocythemia: Secondary | ICD-10-CM

## 2017-03-26 NOTE — Telephone Encounter (Signed)
Patient is having cataract surgery and wants clearance from oncologist.  Per Dr. Mike Gip patient will need to have CBC drawn before procedure.  Spoke with patient this morning and she will call us back when surgery is scheduled and we will have her come in for CBC.  CBC entered in orders.

## 2017-03-28 ENCOUNTER — Other Ambulatory Visit: Payer: Self-pay | Admitting: Family Medicine

## 2017-03-30 ENCOUNTER — Telehealth: Payer: Self-pay | Admitting: Family Medicine

## 2017-03-30 NOTE — Telephone Encounter (Signed)
Copied from [place] [id number]. Topic: Quick Communication - See Telephone Encounter >> Mar 30, 2017  9:05 AM Burnis Medin, NT wrote: CRM for notification. See Telephone encounter for: Patient is calling to see if the nurse could call her back regarding her husbands appointment. Husband name is Neal Dy. DOB is 06/08/25  03/30/17.

## 2017-04-01 ENCOUNTER — Encounter: Payer: Self-pay | Admitting: Obstetrics & Gynecology

## 2017-04-01 ENCOUNTER — Ambulatory Visit: Payer: Medicare HMO | Admitting: Obstetrics & Gynecology

## 2017-04-01 VITALS — BP 130/80 | Ht 64.0 in | Wt 146.0 lb

## 2017-04-01 DIAGNOSIS — N814 Uterovaginal prolapse, unspecified: Secondary | ICD-10-CM | POA: Diagnosis not present

## 2017-04-01 NOTE — Progress Notes (Signed)
  HPI:      [name] is a 84 y.o. [id number] who presents today for her pessary follow up and examination related to her pelvic floor weakening.  Pt reports tolerating the pessary well with  no vaginal bleeding and  no vaginal discharge.  Symptoms of pelvic floor weakening have greatly improved. She is voiding and defecating without difficulty. She currently has a RING #3 pessary.  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 Also,  has a past surgical history that includes Tonsillectomy; Appendectomy; Cesarean section; Joint replacement (Right); Thyroidectomy; Colon surgery; Cataract extraction w/PHACO (Left, 03/25/2015); Adenoidectomy; biopsy, right breast ; Total hip arthroplasty; Thyroid lobectomy; and Upper gi endoscopy (10/17/2008)., family history includes Dementia in her maternal grandfather; Stroke in her father and mother.,  reports that she has quit smoking. she has never used smokeless tobacco. She reports that she does not drink alcohol or use drugs.  She has a current medication list which includes the following prescription(s): amlodipine, aspirin ec, atorvastatin, cetirizine hcl, vitamin d3, diclofenac sodium, hydroxyurea, irbesartan, ketoconazole, levothyroxine, pantoprazole, and ranitidine. Also, is allergic to novocain [procaine].  Review of Systems  All other systems reviewed and are negative.   Objective: BP 130/80   Ht 5\' 4"   (1.626 m)   Wt 146 lb (66.2 kg)   BMI 25.06 kg/m  Physical Exam  Constitutional: She is oriented to person, place, and time. She appears well-developed and well-nourished. No distress.  Genitourinary: Vagina normal and uterus normal. Pelvic exam was performed with patient supine. There is no rash, tenderness or lesion on the right labia. There is no rash, tenderness or lesion on the left labia. No erythema or bleeding in the vagina. Right adnexum does not display mass and does not display tenderness. Left adnexum does not display mass and does not display tenderness. Cervix does not exhibit motion tenderness, discharge, polyp or nabothian cyst.   Uterus is mobile and midaxial. Uterus is not enlarged or exhibiting a mass.  Genitourinary Comments: Stage 3 prolapse and cystocele present  No bleeding or erosions Atrophy present  Abdominal: Soft. She exhibits no distension. There is no tenderness.  Musculoskeletal: Normal range of motion.  Neurological: She is alert and oriented to person, place, and time. No cranial nerve deficit.  Skin: Skin is warm and dry.  Psychiatric: She has a normal mood and affect.   Pessary Care Pessary removed and cleaned.  Vagina checked - without erosions - pessary replaced.  A/P:1. Uterine prolapse 2. Cystocele with prolapse Pessary was cleaned and replaced today. Instructions given for care. Concerning symptoms to observe for are counseled to patient. Follow up scheduled for 3 months.  A total of 15 minutes were spent face-to-face with the patient during this encounter and over half of that time dealt with counseling and coordination of care.  Barnett Applebaum, MD, [facility], [facility] 04/01/2017  9:35 AM

## 2017-04-10 NOTE — Progress Notes (Signed)
Closing out scanned document note open since  Jan 2018

## 2017-04-10 NOTE — Progress Notes (Signed)
Closing out lab/order note open since:  01/24/16

## 2017-04-10 NOTE — Progress Notes (Signed)
Closing out lab/order note open since:  Jan 2018

## 2017-04-10 NOTE — Progress Notes (Signed)
GI report; already wet signed

## 2017-04-17 ENCOUNTER — Other Ambulatory Visit: Payer: Self-pay | Admitting: Family Medicine

## 2017-04-17 DIAGNOSIS — E039 Hypothyroidism, unspecified: Secondary | ICD-10-CM

## 2017-04-17 NOTE — Telephone Encounter (Signed)
Lab Results  Component Value Date   TSH 1.30 01/05/2017

## 2017-04-19 ENCOUNTER — Other Ambulatory Visit: Payer: Self-pay

## 2017-04-19 DIAGNOSIS — E039 Hypothyroidism, unspecified: Secondary | ICD-10-CM

## 2017-04-20 ENCOUNTER — Ambulatory Visit (INDEPENDENT_AMBULATORY_CARE_PROVIDER_SITE_OTHER): Payer: Medicare HMO | Admitting: Family Medicine

## 2017-04-20 ENCOUNTER — Encounter: Payer: Self-pay | Admitting: Family Medicine

## 2017-04-20 VITALS — BP 120/78 | HR 83 | Temp 97.4°F | Resp 14 | Wt 146.8 lb

## 2017-04-20 DIAGNOSIS — M4722 Other spondylosis with radiculopathy, cervical region: Secondary | ICD-10-CM | POA: Diagnosis not present

## 2017-04-20 DIAGNOSIS — M65341 Trigger finger, right ring finger: Secondary | ICD-10-CM | POA: Diagnosis not present

## 2017-04-20 DIAGNOSIS — J069 Acute upper respiratory infection, unspecified: Secondary | ICD-10-CM

## 2017-04-20 DIAGNOSIS — Z636 Dependent relative needing care at home: Secondary | ICD-10-CM

## 2017-04-20 DIAGNOSIS — G8929 Other chronic pain: Secondary | ICD-10-CM

## 2017-04-20 DIAGNOSIS — E039 Hypothyroidism, unspecified: Secondary | ICD-10-CM | POA: Diagnosis not present

## 2017-04-20 DIAGNOSIS — M5412 Radiculopathy, cervical region: Secondary | ICD-10-CM | POA: Diagnosis not present

## 2017-04-20 DIAGNOSIS — M791 Myalgia, unspecified site: Secondary | ICD-10-CM | POA: Diagnosis not present

## 2017-04-20 DIAGNOSIS — M546 Pain in thoracic spine: Secondary | ICD-10-CM

## 2017-04-20 DIAGNOSIS — R69 Illness, unspecified: Secondary | ICD-10-CM | POA: Diagnosis not present

## 2017-04-20 LAB — POCT INFLUENZA A/B
INFLUENZA B, POC: NEGATIVE
Influenza A, POC: NEGATIVE

## 2017-04-20 NOTE — Progress Notes (Signed)
BP 120/78   Pulse 83   Temp (!) 97.4 F (36.3 C) (Oral)   Resp 14   Wt 146 lb 12.8 oz (66.6 kg)   SpO2 95%   BMI 25.20 kg/m    Subjective:    Patient ID: Gina [name], female    DOB: [birth year], 84 y.o.   MRN: [id number]  HPI: Gina [name] is a 84 y.o. female  Chief Complaint  Patient presents with  . Back Pain    neck and shoulder pain  . Cough    sneezing, runny nose  . Chills  . Generalized Body Aches    head ache    HPI Patient is here for back and neck and shoulder pain  She is also having runny nose, body aches, headache, sneezing; no fevers; just started to feel not well yesterday or day before; doing natural remedies and two tylenols; twinge in her head  Patient remembers having radiculopathy in her arm, they thought years ago that she was having a heart attack; 4th finger and 5th finger; right hand now; feeling pain in her neck; sits for hours doing puzzles; felt a little shock in her back and then burning sensation; in the upper back No rash  Hypothyroidism; needs to switch manufacturers  Depression screen [facility] 2/9 04/20/2017 01/05/2017 07/29/2016 06/30/2016 12/31/2015  Decreased Interest 0 0 0 0 0  Down, Depressed, Hopeless 3 0 1 1 0  PHQ - 2 Score 3 0 1 1 0  Altered sleeping 1 - - - -  Tired, decreased energy 0 - - - -  Change in appetite 0 - - - -  Feeling bad or failure about yourself  0 - - - -  Trouble concentrating 0 - - - -  Moving slowly or fidgety/restless 0 - - - -  Suicidal thoughts 0 - - - -  PHQ-9 Score 4 - - - -  Difficult doing work/chores Not difficult at all - - - -    Relevant past medical, surgical, family and social history reviewed Past Medical History:  Diagnosis Date  . Breast mass in female    right breast  . Cataract   . Difficult intubation 03/03/2016   September 06, 2013; left nasal fiberoptic intubation #7 ETT; see letter from Dr. Loanne Drilling, Dept of Anest[facility]iology, [facility]  . Diverticulitis   . Diverticulosis 05/28/2015  .  Essential thrombocytosis ([place]) 07/23/2015  . Genital prolapse   . GERD (gastroesophageal reflux disease)   . 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 Mike Gip  . Hyperlipemia   . Hyperlipidemia    history of   . Hypothyroidism   . Mild atherosclerosis of carotid artery 06/25/2015  . MRI contraindicated due to metal implant   . Prolapse of uterus   . Radiculopathy   . Renal cyst   . Rosacea   . Shingles   . Thrombocytosis ([place])    Past Surgical History:  Procedure Laterality Date  . ADENOIDECTOMY    . APPENDECTOMY    . biopsy, right breast     . CATARACT EXTRACTION W/PHACO Left 03/25/2015   Procedure: CATARACT EXTRACTION PHACO AND INTRAOCULAR LENS PLACEMENT (IOC);  Surgeon: Estill Cotta, MD;  Location: [facility] ORS;  Service: Ophthalmology;  Laterality: Left;  [place] 02:09 AP% 27.3 CDE 59.97 fluid pack lot #[id number]  . CESAREAN SECTION    . COLON SURGERY    . JOINT REPLACEMENT Right    total hip  . THYROID LOBECTOMY    . THYROIDECTOMY  partial  . TONSILLECTOMY    . TOTAL HIP ARTHROPLASTY    . UPPER GI ENDOSCOPY  10/17/2008   Family History  Problem Relation Age of Onset  . Stroke Mother   . Stroke Father   . Dementia Maternal Grandfather    Social History   Tobacco Use  . Smoking status: Former Research scientist (life sciences)  . Smokeless tobacco: Never Used  Substance Use Topics  . Alcohol use: No    Alcohol/week: 0.0 oz  . Drug use: No    Interim medical history since last visit reviewed. Allergies and medications reviewed  Review of Systems Per HPI unless specifically indicated above     Objective:    BP 120/78   Pulse 83   Temp (!) 97.4 F (36.3 C) (Oral)   Resp 14   Wt 146 lb 12.8 oz (66.6 kg)   SpO2 95%   BMI 25.20 kg/m   Wt Readings from Last 3 Encounters:  04/20/17 146 lb 12.8 oz (66.6 kg)  04/01/17 146 lb (66.2 kg)  03/02/17 144 lb 5 oz (65.5 kg)    Physical Exam  Constitutional: She appears well-developed and well-nourished.  HENT:  Right Ear:  Tympanic membrane is not erythematous. No middle ear effusion.  Left Ear: Tympanic membrane is not erythematous.  No middle ear effusion.  Nose: Rhinorrhea present.  Mouth/Throat: Mucous membranes are normal. No posterior oropharyngeal edema or posterior oropharyngeal erythema.  Eyes: EOM are normal. No scleral icterus.  Cardiovascular: Normal rate and regular rhythm.  Pulmonary/Chest: Effort normal and breath sounds normal.  Musculoskeletal:       Right hand: She exhibits deformity (trigger finger right ring finger).  Psychiatric: She has a normal mood and affect. Her behavior is normal.    Results for orders placed or performed in visit on 04/20/17  POCT Influenza A/B  Result Value Ref Range   Influenza A, POC Negative Negative   Influenza B, POC Negative Negative      Assessment & Plan:   Problem List Items Addressed This Visit      Endocrine   Hypothyroidism, adult    Recheck TSH 6-8 weeks after changing manufacturer        Nervous and Auditory   Cervical radiculopathy   Cervical radiculopathy due to degenerative joint disease of spine    Can refer or image; patient passed on both today; she'll try conservative measures       Other Visit Diagnoses    Myalgia    -  Primary   Relevant Orders   POCT Influenza A/B (Completed)   Chronic bilateral thoracic back pain       Trigger ring finger of right hand       Upper respiratory tract infection, unspecified type       do not suspect flu; rest, hydration, notify me or seek help if worsening   Caregiver stress       discussed, support provided       Follow up plan: Return in about 7 weeks (around 06/08/2017) for labs that day only.  An after-visit summary was printed and given to the patient at [facility].  Please see the patient instructions which may contain other information and recommendations beyond what is mentioned above in the assessment and plan.  No orders of the defined types were placed in this  encounter.   Orders Placed This Encounter  Procedures  . POCT Influenza A/B

## 2017-04-20 NOTE — Patient Instructions (Addendum)
Try stretching exercises Try turmeric as a natural anti-inflammatory (for pain and arthritis). It comes in capsules where you buy aspirin and fish oil, but also as a spice where you buy pepper and garlic powder. If you need a prescription for a massage, let me know Lavender aromatherapy may help stress When you start the new manufacturer of the thyroid medicine, return 6-8 weeks later for the blood test (no appointment needed, and not necessary to fast)  Upper Respiratory Infection, Adult Most upper respiratory infections (URIs) are caused by a virus. A URI affects the nose, throat, and upper air passages. The most common type of URI is often called "the common cold." Follow these instructions at home:  Take medicines only as told by your doctor.  Gargle warm saltwater or take cough drops to comfort your throat as told by your doctor.  Use a warm mist humidifier or inhale steam from a shower to increase air moisture. This may make it easier to breathe.  Drink enough fluid to keep your pee (urine) clear or pale yellow.  Eat soups and other clear broths.  Have a healthy diet.  Rest as needed.  Go back to work when your fever is gone or your doctor says it is okay. ? You may need to stay home longer to avoid giving your URI to others. ? You can also wear a face mask and wash your hands often to prevent spread of the virus.  Use your inhaler more if you have asthma.  Do not use any tobacco products, including cigarettes, chewing tobacco, or electronic cigarettes. If you need help quitting, ask your doctor. Contact a doctor if:  You are getting worse, not better.  Your symptoms are not helped by medicine.  You have chills.  You are getting more short of breath.  You have brown or red mucus.  You have yellow or brown discharge from your nose.  You have pain in your face, especially when you bend forward.  You have a fever.  You have puffy (swollen) neck glands.  You have  pain while swallowing.  You have white areas in the back of your throat. Get help right away if:  You have very bad or constant: ? Headache. ? Ear pain. ? Pain in your forehead, behind your eyes, and over your cheekbones (sinus pain). ? Chest pain.  You have long-lasting (chronic) lung disease and any of the following: ? Wheezing. ? Long-lasting cough. ? Coughing up blood. ? A change in your usual mucus.  You have a stiff neck.  You have changes in your: ? Vision. ? Hearing. ? Thinking. ? Mood. This information is not intended to replace advice given to you by your health care provider. Make sure you discuss any questions you have with your health care provider. Document Released: 07/29/2007 Document Revised: 10/13/2015 Document Reviewed: 05/17/2013 Elsevier Interactive Patient Education  2018 [employer].

## 2017-04-21 ENCOUNTER — Other Ambulatory Visit: Payer: Self-pay | Admitting: Hematology and Oncology

## 2017-04-21 DIAGNOSIS — D473 Essential (hemorrhagic) thrombocythemia: Secondary | ICD-10-CM

## 2017-04-21 DIAGNOSIS — D75839 Thrombocytosis, unspecified: Secondary | ICD-10-CM

## 2017-04-26 DIAGNOSIS — Z1231 Encounter for screening mammogram for malignant neoplasm of breast: Secondary | ICD-10-CM | POA: Diagnosis not present

## 2017-04-28 ENCOUNTER — Encounter: Payer: Self-pay | Admitting: Family Medicine

## 2017-04-28 NOTE — Assessment & Plan Note (Signed)
Can refer or image; patient passed on both today; she'll try conservative measures

## 2017-04-28 NOTE — Assessment & Plan Note (Signed)
Recheck TSH 6-8 weeks after changing manufacturer

## 2017-05-04 ENCOUNTER — Other Ambulatory Visit: Payer: Self-pay | Admitting: Family Medicine

## 2017-05-04 ENCOUNTER — Telehealth: Payer: Self-pay | Admitting: Family Medicine

## 2017-05-04 NOTE — Telephone Encounter (Signed)
I spoke with patient about her sx; sounds like shingles She is not so bothered to want medicine Discussed spread of chicken pox virus to others, avoid pregnant women, newborn nursery at church, etc. May get new shingles vaccine in a few months

## 2017-05-04 NOTE — Telephone Encounter (Signed)
Copied from [place] [id number]. Topic: Quick Communication - See Telephone Encounter >> May 04, 2017  3:57 PM Robina Ade, Helene Kelp D wrote: CRM for notification. See Telephone encounter for: 05/04/17. Patient called and said that she went to the office a week or two ago she has Dr. Sanda Klein for shingles. She would like Dr. Sanda Klein to know that the rash is under her left breast and would like to talk to her about this. Please call patient, thanks.

## 2017-06-01 ENCOUNTER — Ambulatory Visit: Payer: Medicare HMO | Admitting: Hematology and Oncology

## 2017-06-01 ENCOUNTER — Other Ambulatory Visit: Payer: Self-pay | Admitting: Urgent Care

## 2017-06-01 ENCOUNTER — Other Ambulatory Visit: Payer: Self-pay

## 2017-06-01 ENCOUNTER — Inpatient Hospital Stay: Payer: Medicare HMO | Attending: Hematology and Oncology

## 2017-06-01 DIAGNOSIS — D75839 Thrombocytosis, unspecified: Secondary | ICD-10-CM

## 2017-06-01 DIAGNOSIS — D473 Essential (hemorrhagic) thrombocythemia: Secondary | ICD-10-CM | POA: Diagnosis not present

## 2017-06-01 DIAGNOSIS — R17 Unspecified jaundice: Secondary | ICD-10-CM

## 2017-06-01 LAB — CBC WITH DIFFERENTIAL/PLATELET
BASOS PCT: 1 %
Basophils Absolute: 0.1 10*3/uL (ref 0–0.1)
Eosinophils Absolute: 0.2 10*3/uL (ref 0–0.7)
Eosinophils Relative: 2 %
HEMATOCRIT: 41 % (ref 35.0–47.0)
HEMOGLOBIN: 14 g/dL (ref 12.0–16.0)
Lymphocytes Relative: 19 %
Lymphs Abs: 1.6 10*3/uL (ref 1.0–3.6)
MCH: 31.5 pg (ref 26.0–34.0)
MCHC: 34 g/dL (ref 32.0–36.0)
MCV: 92.4 fL (ref 80.0–100.0)
MONO ABS: 0.6 10*3/uL (ref 0.2–0.9)
MONOS PCT: 7 %
NEUTROS ABS: 6.1 10*3/uL (ref 1.4–6.5)
Neutrophils Relative %: 71 %
Platelets: 350 10*3/uL (ref 150–440)
RBC: 4.44 MIL/uL (ref 3.80–5.20)
RDW: 14.9 % — ABNORMAL HIGH (ref 11.5–14.5)
WBC: 8.6 10*3/uL (ref 3.6–11.0)

## 2017-06-01 LAB — COMPREHENSIVE METABOLIC PANEL
ALBUMIN: 4.4 g/dL (ref 3.5–5.0)
ALK PHOS: 80 U/L (ref 38–126)
ALT: 14 U/L (ref 14–54)
AST: 20 U/L (ref 15–41)
Anion gap: 9 (ref 5–15)
BUN: 29 mg/dL — ABNORMAL HIGH (ref 6–20)
CALCIUM: 9.4 mg/dL (ref 8.9–10.3)
CHLORIDE: 105 mmol/L (ref 101–111)
CO2: 24 mmol/L (ref 22–32)
CREATININE: 1.15 mg/dL — AB (ref 0.44–1.00)
GFR calc Af Amer: 48 mL/min — ABNORMAL LOW (ref >60)
GFR calc non Af Amer: 41 mL/min — ABNORMAL LOW (ref >60)
GLUCOSE: 105 mg/dL — AB (ref 65–99)
Potassium: 4.8 mmol/L (ref 3.5–5.1)
SODIUM: 138 mmol/L (ref 135–145)
Total Bilirubin: 1.5 mg/dL — ABNORMAL HIGH (ref 0.3–1.2)
Total Protein: 7.7 g/dL (ref 6.5–8.1)

## 2017-06-01 LAB — BILIRUBIN, DIRECT: Bilirubin, Direct: 0.2 mg/dL (ref 0.1–0.5)

## 2017-06-22 DIAGNOSIS — H2511 Age-related nuclear cataract, right eye: Secondary | ICD-10-CM | POA: Diagnosis not present

## 2017-06-29 ENCOUNTER — Ambulatory Visit: Payer: Medicare HMO | Admitting: Obstetrics & Gynecology

## 2017-06-29 ENCOUNTER — Encounter: Payer: Self-pay | Admitting: Obstetrics & Gynecology

## 2017-06-29 VITALS — BP 120/80 | Ht 64.0 in | Wt 146.0 lb

## 2017-06-29 DIAGNOSIS — N814 Uterovaginal prolapse, unspecified: Secondary | ICD-10-CM

## 2017-06-29 NOTE — Progress Notes (Signed)
  HPI:      [name] is a 84 y.o. [phone] who presents today for her pessary follow up and examination related to her pelvic floor weakening.  Pt reports tolerating the pessary well with  no vaginal bleeding and  no vaginal discharge.  Symptoms of pelvic floor weakening have greatly improved. She is voiding and defecating without difficulty. She currently has a RING #3 pessary.  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 Also,  has a past surgical history that includes Tonsillectomy; Appendectomy; Cesarean section; Joint replacement (Right); Thyroidectomy; Colon surgery; Cataract extraction w/PHACO (Left, 03/25/2015); Adenoidectomy; biopsy, right breast ; Total hip arthroplasty; Thyroid lobectomy; and Upper gi endoscopy (10/17/2008)., family history includes Dementia in her maternal grandfather; Stroke in her father and mother.,  reports that she has quit smoking. She has never used smokeless tobacco. She reports that she does not drink alcohol or use drugs.  She has a current medication list which includes the following prescription(s): amlodipine, aspirin ec, atorvastatin, cetirizine hcl, vitamin d3, diclofenac sodium, hydroxyurea, irbesartan, ketoconazole, levothyroxine, and ranitidine. Also, is allergic to novocain [procaine].  Review of Systems  All other systems reviewed and are negative.  Objective: BP 120/80   Ht 5\' 4"  (1.626 m)   Wt 146 lb (66.2 kg)   BMI 25.06 kg/m  Physical Exam  Constitutional: She is oriented to  person, place, and time. She appears well-developed and well-nourished. No distress.  Genitourinary: Vagina normal and uterus normal. Pelvic exam was performed with patient supine. There is no rash, tenderness or lesion on the right labia. There is no rash, tenderness or lesion on the left labia. No erythema or bleeding in the vagina. Right adnexum does not display mass and does not display tenderness. Left adnexum does not display mass and does not display tenderness. Cervix does not exhibit motion tenderness, discharge, polyp or nabothian cyst.   Uterus is mobile and midaxial. Uterus is not enlarged or exhibiting a mass.  Genitourinary Comments: Vag atrophy present Cystocele and uterine prolapse at stage 3 No bleeding or erosions  Neck: Full passive range of motion without pain. No thyroid mass and no thyromegaly present.  Abdominal: Soft. She exhibits no distension. There is no tenderness.  Musculoskeletal: Normal range of motion.  Neurological: She is alert and oriented to person, place, and time. No cranial nerve deficit.  Skin: Skin is warm and dry.  Psychiatric: She has a normal mood and affect.   Pessary Care Pessary removed and cleaned.  Vagina checked - without erosions - pessary replaced.  A/P: PROLAPSE, CYSTOCELE Pessary was cleaned and replaced today. Instructions given for care. Concerning symptoms to observe for are counseled to patient. Follow up scheduled for 3 months.  A total of 15 minutes were spent face-to-face with the patient during this encounter and over half of that time dealt with counseling and coordination of care.  Barnett Applebaum, MD, [facility], [facility] 06/29/2017  9:49 AM

## 2017-08-20 ENCOUNTER — Ambulatory Visit: Payer: Self-pay | Admitting: Family Medicine

## 2017-08-20 NOTE — Telephone Encounter (Signed)
   Pt concerned of BP fluctuation. BP today 122/66 and BP's for the 26 and 27 128-157/63-85. Pt denies SOB, headache, dizziness, chest pain, double vision or spots.  Pt states that she is under a great deal of stress. She is currently taking care of her 82 year old husband and stated that she feels low energy and stressed out. Pt advised if she develops any sx or worsens to call back or got to the nearest [facility] or ED.   Reason for Disposition . Health Information question, no triage required and triager able to answer question  Answer Assessment - Initial Assessment Questions 1. REASON FOR CALL or QUESTION: "What is your reason for calling today?" or "How can I best help you?" or "What question do you have that I can help answer?"     Pt concerned of BP fluctuation. BP today 122/66 and BP's for the 26 and 27 128-157/63-85. Pt denies SOB, headache, dizziness, chest pain, double vision or spots.  Pt states that she is under a great deal of stress. She is currently taking care of her 67 year old husband and stated that she feels low energy and stressed out. Pt advised if she develops any sx or worsens to call back or got to the nearest [facility] or ED.  Protocols used: INFORMATION ONLY CALL-A-AH

## 2017-08-23 ENCOUNTER — Other Ambulatory Visit: Payer: Self-pay | Admitting: *Deleted

## 2017-08-23 DIAGNOSIS — D473 Essential (hemorrhagic) thrombocythemia: Secondary | ICD-10-CM

## 2017-08-24 ENCOUNTER — Encounter: Payer: Self-pay | Admitting: Hematology and Oncology

## 2017-08-24 ENCOUNTER — Inpatient Hospital Stay: Payer: Medicare HMO

## 2017-08-24 ENCOUNTER — Telehealth: Payer: Self-pay | Admitting: *Deleted

## 2017-08-24 ENCOUNTER — Other Ambulatory Visit: Payer: Self-pay

## 2017-08-24 ENCOUNTER — Inpatient Hospital Stay: Payer: Medicare HMO | Attending: Hematology and Oncology | Admitting: Hematology and Oncology

## 2017-08-24 VITALS — BP 149/72 | HR 80 | Temp 97.7°F | Resp 18 | Wt 145.2 lb

## 2017-08-24 DIAGNOSIS — Z87891 Personal history of nicotine dependence: Secondary | ICD-10-CM | POA: Insufficient documentation

## 2017-08-24 DIAGNOSIS — Z7982 Long term (current) use of aspirin: Secondary | ICD-10-CM | POA: Insufficient documentation

## 2017-08-24 DIAGNOSIS — D473 Essential (hemorrhagic) thrombocythemia: Secondary | ICD-10-CM | POA: Diagnosis not present

## 2017-08-24 DIAGNOSIS — K5909 Other constipation: Secondary | ICD-10-CM | POA: Insufficient documentation

## 2017-08-24 DIAGNOSIS — N289 Disorder of kidney and ureter, unspecified: Secondary | ICD-10-CM | POA: Diagnosis not present

## 2017-08-24 LAB — CBC WITH DIFFERENTIAL/PLATELET
Basophils Absolute: 0.1 10*3/uL (ref 0–0.1)
Basophils Relative: 1 %
Eosinophils Absolute: 0.2 10*3/uL (ref 0–0.7)
Eosinophils Relative: 2 %
HCT: 39.5 % (ref 35.0–47.0)
Hemoglobin: 13.4 g/dL (ref 12.0–16.0)
Lymphocytes Relative: 16 %
Lymphs Abs: 1.3 10*3/uL (ref 1.0–3.6)
MCH: 31.5 pg (ref 26.0–34.0)
MCHC: 34 g/dL (ref 32.0–36.0)
MCV: 92.5 fL (ref 80.0–100.0)
Monocytes Absolute: 0.7 10*3/uL (ref 0.2–0.9)
Monocytes Relative: 8 %
Neutro Abs: 5.9 10*3/uL (ref 1.4–6.5)
Neutrophils Relative %: 73 %
Platelets: 475 10*3/uL — ABNORMAL HIGH (ref 150–440)
RBC: 4.27 MIL/uL (ref 3.80–5.20)
RDW: 14.6 % — ABNORMAL HIGH (ref 11.5–14.5)
WBC: 8.1 10*3/uL (ref 3.6–11.0)

## 2017-08-24 LAB — COMPREHENSIVE METABOLIC PANEL
ALT: 18 U/L (ref 0–44)
AST: 23 U/L (ref 15–41)
Albumin: 4.3 g/dL (ref 3.5–5.0)
Alkaline Phosphatase: 86 U/L (ref 38–126)
Anion gap: 9 (ref 5–15)
BUN: 30 mg/dL — ABNORMAL HIGH (ref 8–23)
CO2: 22 mmol/L (ref 22–32)
Calcium: 9.7 mg/dL (ref 8.9–10.3)
Chloride: 103 mmol/L (ref 98–111)
Creatinine, Ser: 1.21 mg/dL — ABNORMAL HIGH (ref 0.44–1.00)
GFR calc Af Amer: 45 mL/min — ABNORMAL LOW (ref >60)
GFR calc non Af Amer: 38 mL/min — ABNORMAL LOW (ref >60)
Glucose, Bld: 109 mg/dL — ABNORMAL HIGH (ref 70–99)
Potassium: 5.1 mmol/L (ref 3.5–5.1)
Sodium: 134 mmol/L — ABNORMAL LOW (ref 135–145)
Total Bilirubin: 1.2 mg/dL (ref 0.3–1.2)
Total Protein: 7.6 g/dL (ref 6.5–8.1)

## 2017-08-24 NOTE — Progress Notes (Signed)
[facility] day:  08/24/2017  Chief Complaint: Gina [name] is a 84 y.o. female with essential thrombocythemia (ET) who is seen for 5 month assessment.  HPI:  The patient was last seen in the medical oncology clinic on 03/02/2017 by Honor Loh, NP.  At that time, she was feeling "wonderful".  She had chronic constipation, improved significantly with the use of Miralax. GERD symptoms had also improved.  She was seeing GI.  She was taking Hydroxyurea 540m on Monday, Wednesday, and Friday.  Exam was unremarkable.  Platelet count was 405,000.  CBC on 06/01/2017 revealed a hematocrit of 41.0, hemoglobin 14.0, MCV 92.4, platelets 350,000, WBC 8600 with an ANC of 6100.  During the interim, she has done well.  She continues on hydroxyurea.  She denies any Charlie horses.  She notes a Morton's neuroma in her foot.  She notes chronic constipation (since she was a child).   Past Medical History:  Diagnosis Date  . Breast mass in female    right breast  . Cataract   . Difficult intubation 03/03/2016   September 06, 2013; left nasal fiberoptic intubation #7 ETT; see [facility]anne Drilling [facility] . Diverticulitis   . Diverticulosis 05/28/2015  . Essential thrombocytosis ([place] 07/23/2015  . Genital prolapse   . GERD (gastroesophageal reflux disease)   . 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 CMike Gip . Hyperlipemia   . Hyperlipidemia    history of   . Hypothyroidism   . Mild atherosclerosis of carotid artery 06/25/2015  . MRI contraindicated due to metal implant   . Prolapse of uterus   . Radiculopathy   . Renal cyst   . Rosacea   . Shingles   . [place]     Past Surgical History:  Procedure Laterality Date  . ADENOIDECTOMY    . APPENDECTOMY    . biopsy, right breast     . CATARACT EXTRACTION W/PHACO Left 03/25/2015   Procedure: CATARACT EXTRACTION PHACO AND INTRAOCULAR LENS PLACEMENT (IOC);  Surgeon:  SEstill Cotta MD;  Location: [facility] ORS;  Service: Ophthalmology;  Laterality: Left;  [place]:09 AP% 27.3 CDE 59.97 fluid pack lot [id number]  . CESAREAN SECTION    . COLON SURGERY    . JOINT REPLACEMENT Right    total hip  . THYROID LOBECTOMY    . THYROIDECTOMY     partial  . TONSILLECTOMY    . TOTAL HIP ARTHROPLASTY    . UPPER GI ENDOSCOPY  10/17/2008    Family History  Problem Relation Age of Onset  . Stroke Mother   . Stroke Father   . Dementia Maternal Grandfather     Social History:  reports that she has quit smoking. She has never used smokeless tobacco. She reports that she does not drink alcohol or use drugs.  [place]  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  She will be 89 in 2 weeks.  She has 8 children (3 + 5 between she and her husband).  Her husband is 987  He has stage IV prostate cancer and is treated by Dr. FGrayland Ormond  He requires care 24/7.  He is housebound.  The patient is alone today.  Allergies:  Allergies  Allergen Reactions  . Novocain [Procaine] Palpitations  Current Medications: Current Outpatient Medications  Medication Sig Dispense Refill  . amLODipine (NORVASC) 5 MG tablet TAKE ONE TABLET BY MOUTH ONCE DAILY 90 tablet 3  . aspirin EC 81 MG tablet Take 81 mg by mouth daily.     Marland Kitchen atorvastatin (LIPITOR) 20 MG tablet Take 1 tablet (20 mg total) by mouth at bedtime. 90 tablet 1  . cetirizine HCl (CETIRIZINE HCL CHILDRENS ALRGY) 5 MG/5ML SOLN Take 5 mg daily by mouth.     . Cholecalciferol (VITAMIN D3) 1000 units CAPS Take by mouth daily.     . hydroxyurea (HYDREA) 500 MG capsule TAKE ONE CAPSULE BY MOUTH ON MONDAY, WEDNESDAY, AND FRIDAY. MAY TAKE WITH FOOD TO MINIMIZE GI SIDE EFFECTS 30 capsule 3  . irbesartan (AVAPRO) 150 MG tablet Take 1 tablet (150 mg total) by mouth daily. 90 tablet 5  .  ketoconazole (NIZORAL) 2 % shampoo Apply 1 application topically 2 (two) times a week.     . levothyroxine (SYNTHROID, LEVOTHROID) 88 MCG tablet TAKE 1 TABLET BY MOUTH ONCE DAILY BEFORE BREAKFAST 90 tablet 2  . polyethylene glycol powder (GLYCOLAX/MIRALAX) powder Take 1 Container by mouth once.    . ranitidine (ZANTAC) 150 MG tablet Take 150 mg by mouth.    . diclofenac sodium (VOLTAREN) 1 % GEL Apply 2-4 grams over the knee(s) four times a day if needed for pain (Patient not taking: Reported on 01/05/2017) 100 g 2   No current facility-administered medications for this visit.     Review of Systems:  GENERAL:  Feels "ok".  No fevers, sweats or weight loss.  Weight up 1 pound. PERFORMANCE STATUS (ECOG):  1 HEENT:  No visual changes, runny nose, sore throat, mouth sores or tenderness. Lungs: No shortness of breath or cough.  No hemoptysis. Cardiac:  No chest pain, palpitations, orthopnea, or PND. GI:  Chronic constipation.  No nausea, vomiting, diarrhea,melena or hematochezia. GU:  No urgency, frequency, dysuria, or hematuria. Musculoskeletal:  No Charlie horses.  No back pain.  No joint pain.  No muscle tenderness. Extremities:  No pain or swelling. Skin:  No rashes or skin changes. Neuro:  No headache, numbness or weakness, balance or coordination issues. Endocrine:  No diabetes.  Thyroid  Disease on Synthroid.  No hot flashes or night sweats. Psych:  No mood changes, depression or anxiety. Pain:  No focal pain. Review of systems:  All other systems reviewed and found to be negative.   Physical Exam: Blood pressure (!) 149/72, pulse 80, temperature 97.7 F (36.5 C), temperature source Tympanic, resp. rate 18, weight 145 lb 3 oz (65.9 kg). GENERAL:  Well developed, well nourished, woman sitting comfortably in the exam room in no acute distress. MENTAL STATUS:  Alert and oriented to person, place and time. HEAD:  Short gray hair.  Normocephalic, atraumatic, face symmetric, no Cushingoid  features. EYES:  Glasses.  Brown/hazel eyes.  Pupils equal round and reactive to light and accomodation.  No conjunctivitis or scleral icterus. ENT:  Oropharynx clear without lesion.  Tongue normal. Mucous membranes moist.  RESPIRATORY:  Clear to auscultation without rales, wheezes or rhonchi. CARDIOVASCULAR:  Regular rate and rhythm without murmur, rub or gallop. ABDOMEN:  Soft, non-tender, with active bowel sounds, and no hepatosplenomegaly.  No masses. SKIN:  No rashes, ulcers or lesions. EXTREMITIES: No edema, no skin discoloration or tenderness.  No palpable cords. LYMPH NODES: No palpable cervical, supraclavicular, axillary or inguinal adenopathy  NEUROLOGICAL: Unremarkable. PSYCH:  Appropriate.    Orders Only on  08/24/2017  Component Date Value Ref Range Status  . Sodium 08/24/2017 134* 135 - 145 mmol/L Final  . Potassium 08/24/2017 5.1  3.5 - 5.1 mmol/L Final  . Chloride 08/24/2017 103  98 - 111 mmol/L Final   Please note change in reference range.  . CO2 08/24/2017 22  22 - 32 mmol/L Final  . Glucose, Bld 08/24/2017 109* 70 - 99 mg/dL Final   Please note change in reference range.  . BUN 08/24/2017 30* 8 - 23 mg/dL Final   Please note change in reference range.  . Creatinine, Ser 08/24/2017 1.21* 0.44 - 1.00 mg/dL Final  . Calcium 08/24/2017 9.7  8.9 - 10.3 mg/dL Final  . Total Protein 08/24/2017 7.6  6.5 - 8.1 g/dL Final  . Albumin 08/24/2017 4.3  3.5 - 5.0 g/dL Final  . AST 08/24/2017 23  15 - 41 U/L Final  . ALT 08/24/2017 18  0 - 44 U/L Final   Please note change in reference range.  . Alkaline Phosphatase 08/24/2017 86  38 - 126 U/L Final  . Total Bilirubin 08/24/2017 1.2  0.3 - 1.2 mg/dL Final  . GFR calc non Af Amer 08/24/2017 38* >60 mL/min Final  . GFR calc Af Amer 08/24/2017 45* >60 mL/min Final   Comment: (NOTE) The eGFR has been calculated using the CKD EPI equation. This calculation has not been validated in all clinical situations. eGFR's persistently <60  mL/min signify possible Chronic Kidney Disease.   Georgiann Hahn gap 08/24/2017 9  5 - 15 Final   Performed at [facility], [address] [place], [place] [address]  . WBC 08/24/2017 8.1  3.6 - 11.0 K/uL Final  . RBC 08/24/2017 4.27  3.80 - 5.20 MIL/uL Final  . Hemoglobin 08/24/2017 13.4  12.0 - 16.0 g/dL Final  . HCT 08/24/2017 39.5  35.0 - 47.0 % Final  . MCV 08/24/2017 92.5  80.0 - 100.0 fL Final  . MCH 08/24/2017 31.5  26.0 - 34.0 pg Final  . MCHC 08/24/2017 34.0  32.0 - 36.0 g/dL Final  . RDW 08/24/2017 14.6* 11.5 - 14.5 % Final  . Platelets 08/24/2017 475* 150 - 440 K/uL Final  . Neutrophils Relative % 08/24/2017 73  % Final  . Neutro Abs 08/24/2017 5.9  1.4 - 6.5 K/uL Final  . Lymphocytes Relative 08/24/2017 16  % Final  . Lymphs Abs 08/24/2017 1.3  1.0 - 3.6 K/uL Final  . Monocytes Relative 08/24/2017 8  % Final  . Monocytes Absolute 08/24/2017 0.7  0.2 - 0.9 K/uL Final  . Eosinophils Relative 08/24/2017 2  % Final  . Eosinophils Absolute 08/24/2017 0.2  0 - 0.7 K/uL Final  . Basophils Relative 08/24/2017 1  % Final  . Basophils Absolute 08/24/2017 0.1  0 - 0.1 K/uL Final   Performed at [facility], [address] [place], [place] [address]    Assessment:  Gina [name] is a 84 y.o. female with essential thrombocythemia (ET) diagnosed on 06/24/2015.  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  MPL is negative.  She is on hydroxyurea 500 mg po every Monday, Wednesday and Friday.  She is on a baby aspirin BID.  She has mild renal insufficiency (CrCl 36 ml/min).  She has intermittent mild hyperbilirubinemia (? Gilbert's disease).  Bilirubin has ranged between 1.2 -1.8.  Direct bilirubin was 0.1 on 06/01/2016.  Symptomatically, she feels "ok".  She is taking Hydroxyurea 523m on Monday, Wednesday, and Friday.  Exam is stable.  Platelet count  is 475,000.  Plan: 1.  Labs today: CBC with diff, CMP. 2.  Contact patient with today's platelet count (not available at time of appointment).   Goal platelet count is < 400,000.  3.  Continue hydroxyurea 568m every Monday, Wednesday, Friday. Adjustment to be made based on today's counts. 4.  Continue aspirin 81 mg once a day. 5.  RTC in 3 months for labs (CBC with diff, CMP). 6.  RTC in 6 months for MD assessment and labs (CBC with diff, CMP).  Addendum:  Patient to be contacted regarding platelet count above goal.  Will need to increase dose slightly (1 pill a week OR 1 pill every other week).  Will need follow-up counts in 1 month.   MLequita Asal MD  08/24/2017, 1:45 PM

## 2017-08-24 NOTE — Telephone Encounter (Signed)
-----   Message from Lequita Asal, MD sent at 08/24/2017  1:33 PM EDT ----- Regarding: Please call patient  Platelet count 475,000.  Currently taking Hydroxyurea 500 mg M W F.  Consider increasing dose.  If she does not want to go up by a whole pill a week,  then maybe 1 pill every other week.  Thus   Week 1:  M, W, F Week 2:  M, W, F, Sat Week 3:  M, W, F Week 4:  M, W, F, Sat  Etc  Would check CBC in 1 month.  M  ----- Message ----- From: Interface, Lab In [place] Sent: 08/24/2017  11:49 AM To: Lequita Asal, MD

## 2017-08-24 NOTE — Telephone Encounter (Signed)
Called patient and LVM  to inform her that her platelets today are 475.  She is currently taking Hydroxyurea 500 mg M, W, F.  Per MD, increase dose to either one pill more each week or one pill more every other week.

## 2017-08-24 NOTE — Progress Notes (Signed)
Pt in for follow up has some small reddened areas on top of feet bilaterally.  Pt also reports "fuzzy vision at times".

## 2017-08-31 ENCOUNTER — Other Ambulatory Visit: Payer: Self-pay | Admitting: *Deleted

## 2017-08-31 ENCOUNTER — Telehealth: Payer: Self-pay | Admitting: *Deleted

## 2017-08-31 DIAGNOSIS — D473 Essential (hemorrhagic) thrombocythemia: Secondary | ICD-10-CM

## 2017-08-31 DIAGNOSIS — R69 Illness, unspecified: Secondary | ICD-10-CM | POA: Diagnosis not present

## 2017-08-31 NOTE — Telephone Encounter (Signed)
Called patient to inquire if she received my message regarding increasing her hydrea.  Patient states she did not get my message.  Reviewed her labs with her and advised her per MD that she would like her to increase her hydrea by one pill weekly or every other week.  Patient states she will take and extra one every week.  She will take on Monday, Wednesday, Friday and Saturday. (She states she would rather do it this way so she doesn't get confused with when to take the extra pill).  We will recheck her lab in one month.  Will send message to scheduler.

## 2017-08-31 NOTE — Telephone Encounter (Signed)
-----   Message from Karen Kitchens, NP sent at 08/30/2017 10:12 PM EDT ----- Regarding: FW: Inquire about hydrea I need help with this one. What is she going to do? We cant tell by the note.  Gaspar Bidding  ----- Message ----- From: Lequita Asal, MD Sent: 08/30/2017   9:49 PM To: Karen Kitchens, NP Subject: Inquire about hydrea                            See clinic note.  Platelet count above goal.  Patient to increase dose with follow-up counts in 1 month.  I am unsure by phone note what she will be doing.  M

## 2017-09-02 ENCOUNTER — Other Ambulatory Visit: Payer: Self-pay | Admitting: *Deleted

## 2017-09-02 DIAGNOSIS — D473 Essential (hemorrhagic) thrombocythemia: Secondary | ICD-10-CM

## 2017-09-05 ENCOUNTER — Other Ambulatory Visit: Payer: Self-pay | Admitting: Family Medicine

## 2017-09-28 DIAGNOSIS — M1612 Unilateral primary osteoarthritis, left hip: Secondary | ICD-10-CM | POA: Diagnosis not present

## 2017-10-06 ENCOUNTER — Encounter: Payer: Self-pay | Admitting: Obstetrics & Gynecology

## 2017-10-06 ENCOUNTER — Ambulatory Visit: Payer: Medicare HMO | Admitting: Obstetrics & Gynecology

## 2017-10-06 VITALS — BP 120/80 | Ht 64.0 in | Wt 147.0 lb

## 2017-10-06 DIAGNOSIS — N814 Uterovaginal prolapse, unspecified: Secondary | ICD-10-CM | POA: Diagnosis not present

## 2017-10-06 NOTE — Progress Notes (Signed)
  HPI:      [name] is a 84 y.o. [phone] who presents today for her pessary follow up and examination related to her pelvic floor weakening.  Pt reports tolerating the pessary well with  no vaginal bleeding and  no vaginal discharge, although does start to have d/c at end of 3 month cycle.  Symptoms of pelvic floor weakening have greatly improved. She is voiding and defecating without difficulty. She currently has a Ring #3 pessary.  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 Also,  has a past surgical history that includes Tonsillectomy; Appendectomy; Cesarean section; Joint replacement (Right); Thyroidectomy; Colon surgery; Cataract extraction w/PHACO (Left, 03/25/2015); Adenoidectomy; biopsy, right breast ; Total hip arthroplasty; Thyroid lobectomy; and Upper gi endoscopy (10/17/2008)., family history includes Dementia in her maternal grandfather; Stroke in her father and mother.,  reports that she has quit smoking. She has never used smokeless tobacco. She reports that she does not drink alcohol or use drugs.  She has a current medication list which includes the following prescription(s): amlodipine, aspirin ec, atorvastatin, cetirizine hcl, vitamin d3, diclofenac sodium, hydroxyurea, irbesartan, ketoconazole, levothyroxine, polyethylene glycol powder, ranitidine, and ranitidine. Also, is allergic to novocain [procaine].  Review of Systems  All other systems reviewed and are negative.  Objective: BP 120/80   Ht 5\' 4"  (1.626 m)    Wt 147 lb (66.7 kg)   BMI 25.23 kg/m  Physical Exam  Constitutional: She is oriented to person, place, and time. She appears well-developed and well-nourished. No distress.  Genitourinary: Vagina normal and uterus normal. Pelvic exam was performed with patient supine. There is no rash, tenderness or lesion on the right labia. There is no rash, tenderness or lesion on the left labia. No erythema or bleeding in the vagina. Right adnexum does not display mass and does not display tenderness. Left adnexum does not display mass and does not display tenderness. Cervix does not exhibit motion tenderness, discharge, polyp or nabothian cyst.   Uterus is mobile and midaxial. Uterus is not enlarged or exhibiting a mass.  Genitourinary Comments: Vag atrophy present Cystocele and uterine prolapse at stage 3 No bleeding or erosions   HENT:  Head: Normocephalic and atraumatic.  Nose: Nose normal.  Mouth/Throat: Oropharynx is clear and moist.  Abdominal: Soft. She exhibits no distension. There is no tenderness.  Musculoskeletal: Normal range of motion.  Neurological: She is alert and oriented to person, place, and time. No cranial nerve deficit.  Skin: Skin is warm and dry.  Psychiatric: She has a normal mood and affect.   Pessary Care Pessary removed and cleaned.  Vagina checked - without erosions - pessary replaced.  A/P: Uterine Prolapse Pessary was cleaned and replaced today. Instructions given for care. Concerning symptoms to observe for are counseled to patient. Follow up scheduled for 3 months.  A total of 15 minutes were spent face-to-face with the patient during this encounter and over half of that time dealt with counseling and coordination of care.  Barnett Applebaum, MD, [facility], [facility] 10/06/2017  11:34 AM

## 2017-10-14 ENCOUNTER — Other Ambulatory Visit: Payer: Medicare HMO

## 2017-10-15 ENCOUNTER — Inpatient Hospital Stay: Payer: Medicare HMO | Attending: Hematology and Oncology

## 2017-10-15 DIAGNOSIS — D473 Essential (hemorrhagic) thrombocythemia: Secondary | ICD-10-CM | POA: Insufficient documentation

## 2017-10-15 LAB — CBC WITH DIFFERENTIAL/PLATELET
Basophils Absolute: 0.1 10*3/uL (ref 0–0.1)
Basophils Relative: 1 %
EOS ABS: 0.2 10*3/uL (ref 0–0.7)
EOS PCT: 3 %
HCT: 38.8 % (ref 35.0–47.0)
Hemoglobin: 13.2 g/dL (ref 12.0–16.0)
LYMPHS ABS: 1.4 10*3/uL (ref 1.0–3.6)
Lymphocytes Relative: 18 %
MCH: 31.8 pg (ref 26.0–34.0)
MCHC: 34.1 g/dL (ref 32.0–36.0)
MCV: 93.2 fL (ref 80.0–100.0)
MONO ABS: 0.6 10*3/uL (ref 0.2–0.9)
MONOS PCT: 8 %
Neutro Abs: 5.7 10*3/uL (ref 1.4–6.5)
Neutrophils Relative %: 70 %
PLATELETS: 389 10*3/uL (ref 150–440)
RBC: 4.17 MIL/uL (ref 3.80–5.20)
RDW: 15.3 % — AB (ref 11.5–14.5)
WBC: 8 10*3/uL (ref 3.6–11.0)

## 2017-11-01 ENCOUNTER — Other Ambulatory Visit: Payer: Self-pay | Admitting: Family Medicine

## 2017-11-04 DIAGNOSIS — R69 Illness, unspecified: Secondary | ICD-10-CM | POA: Diagnosis not present

## 2017-11-11 DIAGNOSIS — I10 Essential (primary) hypertension: Secondary | ICD-10-CM | POA: Diagnosis not present

## 2017-11-11 DIAGNOSIS — N183 Chronic kidney disease, stage 3 (moderate): Secondary | ICD-10-CM | POA: Diagnosis not present

## 2017-11-11 DIAGNOSIS — R8290 Unspecified abnormal findings in urine: Secondary | ICD-10-CM | POA: Diagnosis not present

## 2017-11-24 ENCOUNTER — Inpatient Hospital Stay: Payer: Medicare HMO | Attending: Hematology and Oncology

## 2017-11-24 ENCOUNTER — Other Ambulatory Visit: Payer: Self-pay

## 2017-11-24 DIAGNOSIS — D473 Essential (hemorrhagic) thrombocythemia: Secondary | ICD-10-CM | POA: Insufficient documentation

## 2017-11-24 LAB — CBC WITH DIFFERENTIAL/PLATELET
Basophils Absolute: 0 10*3/uL (ref 0–0.1)
Basophils Relative: 1 %
Eosinophils Absolute: 0.2 10*3/uL (ref 0–0.7)
Eosinophils Relative: 2 %
HCT: 39 % (ref 35.0–47.0)
Hemoglobin: 13 g/dL (ref 12.0–16.0)
Lymphocytes Relative: 17 %
Lymphs Abs: 1.3 10*3/uL (ref 1.0–3.6)
MCH: 31.3 pg (ref 26.0–34.0)
MCHC: 33.4 g/dL (ref 32.0–36.0)
MCV: 93.9 fL (ref 80.0–100.0)
Monocytes Absolute: 0.6 10*3/uL (ref 0.2–0.9)
Monocytes Relative: 7 %
Neutro Abs: 5.5 10*3/uL (ref 1.4–6.5)
Neutrophils Relative %: 73 %
Platelets: 351 10*3/uL (ref 150–440)
RBC: 4.15 MIL/uL (ref 3.80–5.20)
RDW: 15 % — ABNORMAL HIGH (ref 11.5–14.5)
WBC: 7.6 10*3/uL (ref 3.6–11.0)

## 2017-11-24 LAB — COMPREHENSIVE METABOLIC PANEL
ALT: 18 U/L (ref 0–44)
AST: 23 U/L (ref 15–41)
Albumin: 4.1 g/dL (ref 3.5–5.0)
Alkaline Phosphatase: 70 U/L (ref 38–126)
Anion gap: 8 (ref 5–15)
BUN: 26 mg/dL — ABNORMAL HIGH (ref 8–23)
CO2: 23 mmol/L (ref 22–32)
Calcium: 9.2 mg/dL (ref 8.9–10.3)
Chloride: 106 mmol/L (ref 98–111)
Creatinine, Ser: 1.1 mg/dL — ABNORMAL HIGH (ref 0.44–1.00)
GFR calc Af Amer: 50 mL/min — ABNORMAL LOW (ref >60)
GFR calc non Af Amer: 43 mL/min — ABNORMAL LOW (ref >60)
Glucose, Bld: 133 mg/dL — ABNORMAL HIGH (ref 70–99)
Potassium: 4.5 mmol/L (ref 3.5–5.1)
Sodium: 137 mmol/L (ref 135–145)
Total Bilirubin: 1.4 mg/dL — ABNORMAL HIGH (ref 0.3–1.2)
Total Protein: 6.8 g/dL (ref 6.5–8.1)

## 2017-12-22 ENCOUNTER — Other Ambulatory Visit: Payer: Self-pay | Admitting: Hematology and Oncology

## 2017-12-22 DIAGNOSIS — D75839 Thrombocytosis, unspecified: Secondary | ICD-10-CM

## 2017-12-22 DIAGNOSIS — D473 Essential (hemorrhagic) thrombocythemia: Secondary | ICD-10-CM

## 2017-12-22 NOTE — Telephone Encounter (Signed)
)     Ref Range & Units 4wk ago 30mo ago 86mo ago  WBC 3.6 - 11.0 K/uL 7.6  8.0  8.1   RBC 3.80 - 5.20 MIL/uL 4.15  4.17  4.27   Hemoglobin 12.0 - 16.0 g/dL 13.0  13.2  13.4   HCT 35.0 - 47.0 % 39.0  38.8  39.5   MCV 80.0 - 100.0 fL 93.9  93.2  92.5   MCH 26.0 - 34.0 pg 31.3  31.8  31.5   MCHC 32.0 - 36.0 g/dL 33.4  34.1  34.0   RDW 11.5 - 14.5 % 15.0High   15.3High   14.6High    Platelets 150 - 440 K/uL 351  389  475High    Neutrophils Relative % % 73  70  73   Neutro Abs 1.4 - 6.5 K/uL 5.5  5.7  5.9   Lymphocytes Relative % 17  18  16    Lymphs Abs 1.0 - 3.6 K/uL 1.3  1.4  1.3   Monocytes Relative % 7  8  8    Monocytes Absolute 0.2 - 0.9 K/uL 0.6  0.6  0.7   Eosinophils Relative % 2  3  2    Eosinophils Absolute 0 - 0.7 K/uL 0.2  0.2  0.2   Basophils Relative % 1  1  1    Basophils Absolute 0 - 0.1 K/uL 0.0  0.1 CM 0.1 CM  Comment: Performed at [facility], [address] [place], [place] [address]  Resulting Agency  [facility] CLIN LAB [facility]      Specimen Collected: 11/24/17 12:45 Last Resulted: 11/24/17 13:23

## 2017-12-24 ENCOUNTER — Encounter: Payer: Self-pay | Admitting: Nurse Practitioner

## 2017-12-24 ENCOUNTER — Ambulatory Visit (INDEPENDENT_AMBULATORY_CARE_PROVIDER_SITE_OTHER): Payer: Medicare HMO | Admitting: Nurse Practitioner

## 2017-12-24 VITALS — BP 132/64 | HR 81 | Temp 97.4°F | Resp 12 | Ht 64.0 in | Wt 145.4 lb

## 2017-12-24 DIAGNOSIS — H6982 Other specified disorders of Eustachian tube, left ear: Secondary | ICD-10-CM | POA: Diagnosis not present

## 2017-12-24 MED ORDER — FLUTICASONE PROPIONATE 50 MCG/ACT NA SUSP: 2.0000 | Freq: Every day | NASAL | 6 refills | Status: DC

## 2017-12-24 NOTE — Progress Notes (Signed)
Name: Gina [name]   MRN: [id number]    DOB: Jan 06, 1927   Date:12/24/2017       Progress Note  Subjective  Chief Complaint  Chief Complaint  Patient presents with  . Ear Fullness    has history of vertigo with runnny nose    HPI  Patient endorses  left ear feels clogged and head congestion, muffled sounds  x 2-3 days. States feels some mild dizziness States has post nasal drip and allergies. States has had vertigo before; does not feel like that states as very off balanced then; tried meclizine but was unable to tolerate it. Denies fevers, chills, blurry vision, falls, off balanced, lightheadedness.    Patient Active Problem List   Diagnosis Date Noted  . Cystocele with prolapse 04/01/2017  . Preventative health care 01/12/2017  . Hemorrhoids 07/29/2016  . Stress due to illness of family member 07/29/2016  . Gilbert syndrome 06/30/2016  . Difficult intubation 03/03/2016  . Need for SBE (subacute bacterial endocarditis) prophylaxis 10/16/2015  . Essential thrombocytosis ([place]) 07/23/2015  . Mild atherosclerosis of carotid artery 06/25/2015  . Dizziness 06/03/2015  . Dyslipidemia 06/03/2015  . Medication monitoring encounter 05/28/2015  . Thrombocytosis ([place]) 05/28/2015  . Diverticulosis 05/28/2015  . GERD without esophagitis 02/06/2015  . Leg cramps 01/31/2015  . Neuropathy 01/31/2015  . Cervical radiculopathy 01/31/2015  . CKD (chronic kidney disease), stage III ([place]) 10/06/2014  . Renal cyst, acquired, right 10/06/2014  . Uterine prolapse 10/06/2014  . Cervical radiculopathy due to degenerative joint disease of spine 10/06/2014  . History of right hip replacement 10/06/2014  . Pneumococcal vaccination declined by patient 10/06/2014  . Hypothyroidism, adult 10/05/2014  . Essential hypertension 09/13/2014    Past Medical History:  Diagnosis Date  . Breast mass in female    right breast  . Cataract   . Difficult intubation 03/03/2016   September 06, 2013; left nasal  fiberoptic intubation #7 ETT; see letter from Dr. Loanne Drilling, Dept of Anesthesiology, [facility]  . Diverticulitis   . Diverticulosis 05/28/2015  . Essential thrombocytosis ([place]) 07/23/2015  . Genital prolapse   . GERD (gastroesophageal reflux disease)   . 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 Mike Gip  . Hyperlipemia   . Hyperlipidemia    history of   . Hypothyroidism   . Mild atherosclerosis of carotid artery 06/25/2015  . MRI contraindicated due to metal implant   . Prolapse of uterus   . Radiculopathy   . Renal cyst   . Rosacea   . Shingles   . Thrombocytosis ([place])     Past Surgical History:  Procedure Laterality Date  . ADENOIDECTOMY    . APPENDECTOMY    . biopsy, right breast     . CATARACT EXTRACTION W/PHACO Left 03/25/2015   Procedure: CATARACT EXTRACTION PHACO AND INTRAOCULAR LENS PLACEMENT (IOC);  Surgeon: Estill Cotta, MD;  Location: [facility] ORS;  Service: Ophthalmology;  Laterality: Left;  [place] 02:09 AP% 27.3 CDE 59.97 fluid pack lot #[id number]  . CESAREAN SECTION    . COLON SURGERY    . JOINT REPLACEMENT Right    total hip  . THYROID LOBECTOMY    . THYROIDECTOMY     partial  . TONSILLECTOMY    . TOTAL HIP ARTHROPLASTY    . UPPER GI ENDOSCOPY  10/17/2008    Social History   Tobacco Use  . Smoking status: Former Research scientist (life sciences)  . Smokeless tobacco: Never Used  Substance Use Topics  . Alcohol use: No  Alcohol/week: 0.0 standard drinks     Current Outpatient Medications:  .  amLODipine (NORVASC) 5 MG tablet, TAKE ONE TABLET BY MOUTH ONCE DAILY, Disp: 90 tablet, Rfl: 3 .  aspirin EC 81 MG tablet, Take 81 mg by mouth daily. , Disp: , Rfl:  .  atorvastatin (LIPITOR) 20 MG tablet, TAKE 1 TABLET BY MOUTH AT BEDTIME, Disp: 90 tablet, Rfl: 1 .  cetirizine HCl (CETIRIZINE HCL CHILDRENS ALRGY) 5 MG/5ML SOLN, Take 5 mg daily by mouth. , Disp: , Rfl:  .  Cholecalciferol (VITAMIN D3) 1000 units CAPS, Take by mouth daily. , Disp: , Rfl:  .  hydroxyurea (HYDREA) 500 MG  capsule, TAKE 1 CAPSULE BY MOUTH ON MONDAY, WEDNESDAY, AND FRIDAY. MAY TAKE WITH FOOD TO MINIMIZE GI SIDE EFFECTS., Disp: 30 capsule, Rfl: 3 .  irbesartan (AVAPRO) 150 MG tablet, Take 1 tablet (150 mg total) by mouth daily., Disp: 90 tablet, Rfl: 5 .  ketoconazole (NIZORAL) 2 % shampoo, Apply 1 application topically 2 (two) times a week. , Disp: , Rfl:  .  levothyroxine (SYNTHROID, LEVOTHROID) 88 MCG tablet, TAKE 1 TABLET BY MOUTH ONCE DAILY BEFORE BREAKFAST, Disp: 90 tablet, Rfl: 2 .  polyethylene glycol powder (GLYCOLAX/MIRALAX) powder, Take 1 Container by mouth once., Disp: , Rfl:  .  ranitidine (ZANTAC) 150 MG tablet, Take 150 mg by mouth., Disp: , Rfl:  .  ranitidine (ZANTAC) 150 MG tablet, TAKE 1 TABLET BY MOUTH TWICE DAILY AS NEEDED FOR  HEARTBURN, REFLUX, Disp: 60 tablet, Rfl: 11 .  diclofenac sodium (VOLTAREN) 1 % GEL, Apply 2-4 grams over the knee(s) four times a day if needed for pain (Patient not taking: Reported on 12/24/2017), Disp: 100 g, Rfl: 2  Allergies  Allergen Reactions  . Novocain [Procaine] Palpitations    ROS  No other specific complaints in a complete review of systems (except as listed in HPI above).  Objective  Vitals:   12/24/17 1133  BP: 132/64  Pulse: 81  Resp: 12  Temp: (!) 97.4 F (36.3 C)  TempSrc: Oral  SpO2: 99%  Weight: 145 lb 6.4 oz (66 kg)  Height: 5\' 4"  (1.626 m)     Body mass index is 24.96 kg/m.  Nursing Note and Vital Signs reviewed.  Physical Exam  HENT:  Head: Normocephalic and atraumatic.  Right Ear: Hearing, tympanic membrane, external ear and ear canal normal.  Left Ear: External ear and ear canal normal. No swelling or tenderness. Tympanic membrane is not perforated and not erythematous. A middle ear effusion is present.  Nose: Nose normal.  Mouth/Throat: Oropharynx is clear and moist. No oropharyngeal exudate.  Eyes: Conjunctivae are normal. Right eye exhibits no discharge. Left eye exhibits no discharge.  Neck: Normal  range of motion.  Cardiovascular: Normal rate and regular rhythm.  Pulmonary/Chest: Effort normal and breath sounds normal.  Abdominal: Soft. There is no tenderness.  Lymphadenopathy:    She has no cervical adenopathy.  Neurological: She is alert.  Skin: Skin is warm and dry. No rash noted.  Psychiatric: Judgment normal.     No results found for this or any previous visit (from the past 48 hour(s)).  Assessment & Plan  1. Dysfunction of left eustachian tube - take antihistamine daily; see ENT if not resolving  - fluticasone (FLONASE) 50 MCG/ACT nasal spray; Place 2 sprays into both nostrils daily.  Dispense: 16 g; Refill: 6

## 2017-12-24 NOTE — Patient Instructions (Addendum)
- take daily Claritin (loratadine) 10mg   - Use flonase daily  - If you start to have facial pain, fevers, chills, or if symptoms are not improving please let us know  - If you have worsening dizziness go the ER;  Eustachian Tube Dysfunction The eustachian tube connects the middle ear to the back of the nose. It regulates air pressure in the middle ear by allowing air to move between the ear and nose. It also helps to drain fluid from the middle ear space. When the eustachian tube does not function properly, air pressure, fluid, or both can build up in the middle ear. Eustachian tube dysfunction can affect one or both ears. What are the causes? This condition happens when the eustachian tube becomes blocked or cannot open normally. This may result from:  Ear infections.  Colds and other upper respiratory infections.  Allergies.  Irritation, such as from cigarette smoke or acid from the stomach coming up into the esophagus (gastroesophageal reflux).  Sudden changes in air pressure, such as from descending in an airplane.  Abnormal growths in the nose or throat, such as nasal polyps, tumors, or enlarged tissue at the back of the throat (adenoids).  What increases the risk? This condition may be more likely to develop in people who smoke and people who are overweight. Eustachian tube dysfunction may also be more likely to develop in children, especially children who have:  Certain birth defects of the mouth, such as cleft palate.  Large tonsils and adenoids.  What are the signs or symptoms? Symptoms of this condition may include:  A feeling of fullness in the ear.  Ear pain.  Clicking or popping noises in the ear.  Ringing in the ear.  Hearing loss.  Loss of balance.  Symptoms may get worse when the air pressure around you changes, such as when you travel to an area of high elevation or fly on an airplane. How is this diagnosed? This condition may be diagnosed based  on:  Your symptoms.  A physical exam of your ear, nose, and throat.  Tests, such as those that measure: ? The movement of your eardrum (tympanogram). ? Your hearing (audiometry).  How is this treated? Treatment depends on the cause and severity of your condition. If your symptoms are mild, you may be able to relieve your symptoms by moving air into ("popping") your ears. If you have symptoms of fluid in your ears, treatment may include:  Decongestants.  Antihistamines.  Nasal sprays or ear drops that contain medicines that reduce swelling (steroids).  In some cases, you may need to have a procedure to drain the fluid in your eardrum (myringotomy). In this procedure, a small tube is placed in the eardrum to:  Drain the fluid.  Restore the air in the middle ear space.  Follow these instructions at home:  Take over-the-counter and prescription medicines only as told by your health care provider.  Use techniques to help pop your ears as recommended by your health care provider. These may include: ? Chewing gum. ? Yawning. ? Frequent, forceful swallowing. ? Closing your mouth, holding your nose closed, and gently blowing as if you are trying to blow air out of your nose.  Do not do any of the following until your health care provider approves: ? Travel to high altitudes. ? Fly in airplanes. ? Work in a Pension scheme manager or room. ? Scuba dive.  Keep your ears dry. Dry your ears completely after showering or bathing.  Do not smoke.  Keep all follow-up visits as told by your health care provider. This is important. Contact a health care provider if:  Your symptoms do not go away after treatment.  Your symptoms come back after treatment.  You are unable to pop your ears.  You have: ? A fever. ? Pain in your ear. ? Pain in your head or neck. ? Fluid draining from your ear.  Your hearing suddenly changes.  You become very dizzy.  You lose your balance. This  information is not intended to replace advice given to you by your health care provider. Make sure you discuss any questions you have with your health care provider. Document Released: 03/08/2015 Document Revised: 07/18/2015 Document Reviewed: 02/28/2014 Elsevier Interactive Patient Education  [employer].

## 2017-12-29 DIAGNOSIS — I10 Essential (primary) hypertension: Secondary | ICD-10-CM | POA: Diagnosis not present

## 2017-12-29 DIAGNOSIS — E079 Disorder of thyroid, unspecified: Secondary | ICD-10-CM | POA: Diagnosis not present

## 2017-12-29 DIAGNOSIS — H903 Sensorineural hearing loss, bilateral: Secondary | ICD-10-CM | POA: Diagnosis not present

## 2017-12-29 DIAGNOSIS — D473 Essential (hemorrhagic) thrombocythemia: Secondary | ICD-10-CM | POA: Diagnosis not present

## 2017-12-29 DIAGNOSIS — R42 Dizziness and giddiness: Secondary | ICD-10-CM | POA: Diagnosis not present

## 2017-12-30 ENCOUNTER — Other Ambulatory Visit: Payer: Self-pay | Admitting: Physician Assistant

## 2017-12-30 DIAGNOSIS — H918X2 Other specified hearing loss, left ear: Secondary | ICD-10-CM

## 2017-12-30 DIAGNOSIS — IMO0001 Reserved for inherently not codable concepts without codable children: Secondary | ICD-10-CM

## 2018-01-06 ENCOUNTER — Encounter: Payer: Self-pay | Admitting: Obstetrics & Gynecology

## 2018-01-06 ENCOUNTER — Ambulatory Visit: Payer: Medicare HMO | Admitting: Obstetrics & Gynecology

## 2018-01-06 VITALS — BP 138/80 | Ht 64.0 in | Wt 140.0 lb

## 2018-01-06 DIAGNOSIS — N814 Uterovaginal prolapse, unspecified: Secondary | ICD-10-CM | POA: Diagnosis not present

## 2018-01-06 NOTE — Progress Notes (Signed)
  HPI:      [name] is a 84 y.o. [phone] who presents today for her pessary follow up and examination related to her pelvic floor weakening.  Pt reports tolerating the pessary well with  no vaginal bleeding and  no vaginal discharge.  Symptoms of pelvic floor weakening have greatly improved. She is voiding and defecating without difficulty. She currently has a #3 Ring type pessary.  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 Also,  has a past surgical history that includes Tonsillectomy; Appendectomy; Cesarean section; Joint replacement (Right); Thyroidectomy; Colon surgery; Cataract extraction w/PHACO (Left, 03/25/2015); Adenoidectomy; biopsy, right breast ; Total hip arthroplasty; Thyroid lobectomy; and Upper gi endoscopy (10/17/2008)., family history includes Dementia in her maternal grandfather; Stroke in her father and mother.,  reports that she has quit smoking. She has never used smokeless tobacco. She reports that she does not drink alcohol or use drugs.  She has a current medication list which includes the following prescription(s): amlodipine, aspirin ec, atorvastatin, cetirizine hcl, vitamin d3, diclofenac sodium, fluticasone, hydroxyurea, irbesartan, ketoconazole, levothyroxine, polyethylene glycol powder, ranitidine, and ranitidine. Also, is allergic to novocain [procaine].  Review of Systems  All other systems reviewed and are negative.   Objective: BP 138/80   Ht 5\' 4"  (1.626 m)   Wt 140 lb (63.5 kg)   BMI 24.03  kg/m  Physical Exam  Constitutional: She is oriented to person, place, and time. She appears well-developed and well-nourished. No distress.  Genitourinary: Vagina normal and uterus normal. Pelvic exam was performed with patient supine. There is no rash, tenderness or lesion on the right labia. There is no rash, tenderness or lesion on the left labia. No erythema or bleeding in the vagina. Right adnexum does not display mass and does not display tenderness. Left adnexum does not display mass and does not display tenderness. Cervix does not exhibit motion tenderness, discharge, polyp or nabothian cyst.   Uterus is mobile and midaxial. Uterus is not enlarged or exhibiting a mass.  Genitourinary Comments: Vag atrophy present Cystocele and uterine prolapse at stage 3 No bleeding or erosions    HENT:  Head: Normocephalic and atraumatic.  Nose: Nose normal.  Mouth/Throat: Oropharynx is clear and moist.  Abdominal: Soft. She exhibits no distension. There is no tenderness.  Musculoskeletal: Normal range of motion.  Neurological: She is alert and oriented to person, place, and time. No cranial nerve deficit.  Skin: Skin is warm and dry.  Psychiatric: She has a normal mood and affect.   Pessary Care Pessary removed and cleaned.  Vagina checked - without erosions - pessary replaced.  A/P:1. Uterine prolapse 2. Cystocele with prolapse Pessary was cleaned and replaced today. Instructions given for care. Concerning symptoms to observe for are counseled to patient. Follow up scheduled for 3 months.  A total of 15 minutes were spent face-to-face with the patient during this encounter and over half of that time dealt with counseling and coordination of care.  Barnett Applebaum, MD, [facility], [facility] 01/06/2018  10:07 AM

## 2018-01-07 ENCOUNTER — Encounter: Payer: Self-pay | Admitting: Family Medicine

## 2018-01-07 ENCOUNTER — Ambulatory Visit (INDEPENDENT_AMBULATORY_CARE_PROVIDER_SITE_OTHER): Payer: Medicare HMO | Admitting: Family Medicine

## 2018-01-07 VITALS — BP 118/60 | HR 89 | Temp 98.2°F | Ht 64.0 in | Wt 146.5 lb

## 2018-01-07 DIAGNOSIS — R6889 Other general symptoms and signs: Secondary | ICD-10-CM

## 2018-01-07 DIAGNOSIS — I6523 Occlusion and stenosis of bilateral carotid arteries: Secondary | ICD-10-CM | POA: Diagnosis not present

## 2018-01-07 DIAGNOSIS — I878 Other specified disorders of veins: Secondary | ICD-10-CM

## 2018-01-07 DIAGNOSIS — E039 Hypothyroidism, unspecified: Secondary | ICD-10-CM | POA: Diagnosis not present

## 2018-01-07 DIAGNOSIS — M5412 Radiculopathy, cervical region: Secondary | ICD-10-CM

## 2018-01-07 DIAGNOSIS — E785 Hyperlipidemia, unspecified: Secondary | ICD-10-CM | POA: Diagnosis not present

## 2018-01-07 DIAGNOSIS — Z6379 Other stressful life events affecting family and household: Secondary | ICD-10-CM | POA: Diagnosis not present

## 2018-01-07 DIAGNOSIS — H9190 Unspecified hearing loss, unspecified ear: Secondary | ICD-10-CM

## 2018-01-07 DIAGNOSIS — R69 Illness, unspecified: Secondary | ICD-10-CM | POA: Diagnosis not present

## 2018-01-07 LAB — LIPID PANEL
Cholesterol: 158 mg/dL (ref <200)
HDL: 53 mg/dL (ref >50)
LDL CHOLESTEROL (CALC): 80 mg/dL
Non-HDL Cholesterol (Calc): 105 mg/dL (calc) (ref <130)
Total CHOL/HDL Ratio: 3 (calc) (ref <5.0)
Triglycerides: 151 mg/dL — ABNORMAL HIGH (ref <150)

## 2018-01-07 LAB — TSH: TSH: 1.55 m[IU]/L (ref 0.40–4.50)

## 2018-01-07 LAB — T4, FREE: FREE T4: 1.6 ng/dL (ref 0.8–1.8)

## 2018-01-07 NOTE — Assessment & Plan Note (Signed)
Offered PT; she does her own exercises; she will continue massage

## 2018-01-07 NOTE — Assessment & Plan Note (Signed)
Mild; patient politely declines, will consider next year

## 2018-01-07 NOTE — Patient Instructions (Addendum)
Please do see the ENT about your symptoms We'll get labs today Try to elevate your feet or wear compression stockings

## 2018-01-07 NOTE — Assessment & Plan Note (Signed)
Check thyroid tests

## 2018-01-07 NOTE — Progress Notes (Signed)
BP 118/60   Pulse 89   Temp 98.2 F (36.8 C) (Oral)   Ht 5\' 4"  (1.626 m)   Wt 146 lb 8 oz (66.5 kg)   SpO2 98%   BMI 25.15 kg/m    Subjective:    Patient ID: Gina [name], female    DOB: October 18, 1926, 84 y.o.   MRN: [id number]  HPI: Gina [name] is a 84 y.o. female  Chief Complaint  Patient presents with  . Dizziness    lightheaded, cold  . Referral    Discuss ENT appt about left ear  . Rash    red spots on top of feet    HPI Patient is here for multiple issues  She has some red spots of the tops of both feet; she showed it to Dr. Mike Gip; she has thrombocytosis and sees Dr. Mike Gip, hematologist  She saw orthopaedist; left hip might need replacement; she had the right one done She walks and is fine; does not want to undergo surgery at this time  Working so hard as a caregiver; taking care of Rob; she can get help but it costs money, she knows; the shot for his cancer costs $$$; he is at least ambulatory with a walker  About 3 weeks ago, she had bites on her arm; no pus, did not come to a head; just a red mark; intense itching, "like it was in the core of my being"; she has rosacea and skin cancer and shingles; these marks go away with triamcnolone; she got them in multiple sites, stomach, arms, breasts; stress every day; she cannot change her situation, but can change her view of it; she has her own "alone room"; she has her own sanctuary   She saw an ENT PA; she looked in her ears and was told no infection, everything was fine; they gave her a battery of hearing tests and said it was hearing loss; she had "severe hearing loss"; they suggested putting a needle in her ear and shooting her with mega-doses of steroids; another option was for an MRI; they made an appt for her for an MRI; she does not want to do the MRI and wants to talk to the doctor before deciding about the MRI Her left ear was really plugged up; that went on and she was light-headedness, a little off;  thought it was the ear; saw DNP Suezanne Cheshire, then sent to ENT; she gets very nauseated very easily, brother was the same way  She has cervical radiculopathy; has crunching in her neck; does not really want anything new; just metnions it; lump on the back of the head, been there for decades, as long as she can remember; no change  She would like to have thyroid checked; cold intolerance; weight stable but constipated and chronic  Last CBC just checked in October, no anemia  CKD; just saw DR. Candiss Norse recently  Seeing Dr. Kenton Kingfisher for her pessary   Depression screen [facility] 2/9 01/07/2018 12/24/2017 04/20/2017 01/05/2017 07/29/2016  Decreased Interest 0 0 0 0 0  Down, Depressed, Hopeless 0 0 3 0 1  PHQ - 2 Score 0 0 3 0 1  Altered sleeping 0 0 1 - -  Tired, decreased energy 0 0 0 - -  Change in appetite 0 0 0 - -  Feeling bad or failure about yourself  0 0 0 - -  Trouble concentrating 0 0 0 - -  Moving slowly or fidgety/restless 0 0 0 - -  Suicidal  thoughts 0 0 0 - -  PHQ-9 Score 0 0 4 - -  Difficult doing work/chores Not difficult at all Not difficult at all Not difficult at all - -   Fall Risk  01/07/2018 12/24/2017 04/20/2017 01/05/2017 07/29/2016  Falls in the past year? 0 0 Yes No Yes  Number falls in past yr: 0 0 2 or more - 1  Injury with Fall? 0 - No - No    Relevant past medical, surgical, family and social history reviewed Past Medical History:  Diagnosis Date  . Breast mass in female    right breast  . Cataract   . Difficult intubation 03/03/2016   September 06, 2013; left nasal fiberoptic intubation #7 ETT; see letter from Dr. Loanne Drilling, Dept of Anesthesiology, [facility]  . Diverticulitis   . Diverticulosis 05/28/2015  . Essential thrombocytosis ([place]) 07/23/2015  . Genital prolapse   . GERD (gastroesophageal reflux disease)   . 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 Mike Gip  . Hyperlipemia   . Hyperlipidemia    history of   . Hypothyroidism   . Mild atherosclerosis of  carotid artery 06/25/2015  . Morton's neuroma   . MRI contraindicated due to metal implant   . Prolapse of uterus   . Radiculopathy   . Renal cyst   . Rosacea   . Shingles   . Thrombocytosis ([place])    Past Surgical History:  Procedure Laterality Date  . ADENOIDECTOMY    . APPENDECTOMY    . biopsy, right breast     . CATARACT EXTRACTION W/PHACO Left 03/25/2015   Procedure: CATARACT EXTRACTION PHACO AND INTRAOCULAR LENS PLACEMENT (IOC);  Surgeon: Estill Cotta, MD;  Location: [facility] ORS;  Service: Ophthalmology;  Laterality: Left;  [place] 02:09 AP% 27.3 CDE 59.97 fluid pack lot #[id number]  . CESAREAN SECTION    . COLON SURGERY    . JOINT REPLACEMENT Right    total hip  . THYROID LOBECTOMY    . THYROIDECTOMY     partial  . TONSILLECTOMY    . TOTAL HIP ARTHROPLASTY    . UPPER GI ENDOSCOPY  10/17/2008   Family History  Problem Relation Age of Onset  . Stroke Mother   . Stroke Father   . Dementia Maternal Grandfather    Social History   Tobacco Use  . Smoking status: Former Research scientist (life sciences)  . Smokeless tobacco: Never Used  Substance Use Topics  . Alcohol use: No    Alcohol/week: 0.0 standard drinks  . Drug use: No     Office Visit from 01/07/2018 in [facility]  AUDIT-C Score  0      Interim medical history since last visit reviewed. Allergies and medications reviewed  Review of Systems Per HPI unless specifically indicated above     Objective:    BP 118/60   Pulse 89   Temp 98.2 F (36.8 C) (Oral)   Ht 5\' 4"  (1.626 m)   Wt 146 lb 8 oz (66.5 kg)   SpO2 98%   BMI 25.15 kg/m   Wt Readings from Last 3 Encounters:  01/07/18 146 lb 8 oz (66.5 kg)  01/06/18 140 lb (63.5 kg)  12/24/17 145 lb 6.4 oz (66 kg)    Physical Exam  Constitutional: She appears well-developed and well-nourished.  HENT:  Right Ear: Tympanic membrane and ear canal normal. No decreased hearing is noted.  Left Ear: Tympanic membrane and ear canal normal. No decreased hearing is  noted.  Nose: No rhinorrhea.  Mouth/Throat: Mucous membranes are normal. No posterior oropharyngeal edema or posterior oropharyngeal erythema.  I did not appreciate any decreased hearing during our conversation today; firm swelling over the posterior scalp RIGHT occipital region; no fluctuance; no overlying skin changes  Eyes: EOM are normal. No scleral icterus.  Cardiovascular: Normal rate and regular rhythm.  Pulses:      Dorsalis pedis pulses are 2+ on the right side, and 2+ on the left side.  Pulmonary/Chest: Effort normal and breath sounds normal.  Musculoskeletal:       Right foot: There is no deformity.       Left foot: There is no deformity.       Feet:  Slightly rust-colored skin changes with fine petechiae in symmetric fashion over the dorsa of both feet; nontender, no increased warmth to the touch  Feet:  Right Foot:  Protective Sensation: 5 sites tested. 5 sites sensed.  Skin Integrity: Negative for ulcer, blister, skin breakdown, warmth or callus.  Left Foot:  Protective Sensation: 5 sites tested. 5 sites sensed.  Skin Integrity: Negative for ulcer, blister, skin breakdown, warmth or callus.  Psychiatric: She has a normal mood and affect. Her behavior is normal. Her mood appears not anxious. Cognition and memory are not impaired. She does not exhibit a depressed mood.    Results for orders placed or performed in visit on 11/24/17  Comprehensive metabolic panel  Result Value Ref Range   Sodium 137 135 - 145 mmol/L   Potassium 4.5 3.5 - 5.1 mmol/L   Chloride 106 98 - 111 mmol/L   CO2 23 22 - 32 mmol/L   Glucose, Bld 133 (H) 70 - 99 mg/dL   BUN 26 (H) 8 - 23 mg/dL   Creatinine, Ser 1.10 (H) 0.44 - 1.00 mg/dL   Calcium 9.2 8.9 - 10.3 mg/dL   Total Protein 6.8 6.5 - 8.1 g/dL   Albumin 4.1 3.5 - 5.0 g/dL   AST 23 15 - 41 U/L   ALT 18 0 - 44 U/L   Alkaline Phosphatase 70 38 - 126 U/L   Total Bilirubin 1.4 (H) 0.3 - 1.2 mg/dL   GFR calc non Af Amer 43 (L) >60 mL/min    GFR calc Af Amer 50 (L) >60 mL/min   Anion gap 8 5 - 15  CBC with Differential  Result Value Ref Range   WBC 7.6 3.6 - 11.0 K/uL   RBC 4.15 3.80 - 5.20 MIL/uL   Hemoglobin 13.0 12.0 - 16.0 g/dL   HCT 39.0 35.0 - 47.0 %   MCV 93.9 80.0 - 100.0 fL   MCH 31.3 26.0 - 34.0 pg   MCHC 33.4 32.0 - 36.0 g/dL   RDW 15.0 (H) 11.5 - 14.5 %   Platelets 351 150 - 440 K/uL   Neutrophils Relative % 73 %   Neutro Abs 5.5 1.4 - 6.5 K/uL   Lymphocytes Relative 17 %   Lymphs Abs 1.3 1.0 - 3.6 K/uL   Monocytes Relative 7 %   Monocytes Absolute 0.6 0.2 - 0.9 K/uL   Eosinophils Relative 2 %   Eosinophils Absolute 0.2 0 - 0.7 K/uL   Basophils Relative 1 %   Basophils Absolute 0.0 0 - 0.1 K/uL      Assessment & Plan:   Problem List Items Addressed This Visit      Cardiovascular and Mediastinum   Mild atherosclerosis of carotid artery (Chronic)    Mild; patient politely declines, will consider next year  Relevant Orders   Lipid panel     Endocrine   Hypothyroidism, adult    Check thyroid tests        Nervous and Auditory   Cervical radiculopathy    Offered PT; she does her own exercises; she will continue massage        Other   Stress due to illness of family member   Relevant Orders   Ambulatory referral to [facility]   Dyslipidemia   Relevant Orders   Lipid panel    Other Visit Diagnoses    Cold intolerance    -  Primary   Relevant Orders   TSH   T4, free   Unilateral hearing loss       pt does not want to have the MRI; I explained they are probably looking for an acoustic neuroma, but I don't have any records to confirm; she'll talk to ENT   Venous stasis       skin changes on both feet suggestive of venous stasis, mild; she can elevate her feet, try compression stockings       Follow up plan: Return in about 6 months (around 07/08/2018) for follow-up visit with Dr. Sanda Klein.  An after-visit summary was printed and given to the patient at [facility].  Please see the  patient instructions which may contain other information and recommendations beyond what is mentioned above in the assessment and plan.  No orders of the defined types were placed in this encounter.   Orders Placed This Encounter  Procedures  . TSH  . T4, free  . Lipid panel  . Ambulatory referral to [facility]

## 2018-01-11 ENCOUNTER — Other Ambulatory Visit: Payer: Self-pay | Admitting: Family Medicine

## 2018-01-11 DIAGNOSIS — E039 Hypothyroidism, unspecified: Secondary | ICD-10-CM

## 2018-01-11 MED ORDER — FAMOTIDINE 20 MG PO TABS: 20.0000 mg | ORAL_TABLET | Freq: Two times a day (BID) | ORAL | 2 refills | Status: DC

## 2018-01-11 NOTE — Progress Notes (Signed)
Zantac recalled New Rx sent

## 2018-01-12 NOTE — Telephone Encounter (Signed)
Lab Results  Component Value Date   TSH 1.55 01/07/2018   Rx approved

## 2018-01-14 ENCOUNTER — Ambulatory Visit: Payer: Medicare HMO

## 2018-02-03 ENCOUNTER — Ambulatory Visit: Payer: Medicare HMO

## 2018-02-25 NOTE — Progress Notes (Signed)
[facility]  [phone] [phone]  ID: [name] OB: [birth year]  MR#: [id number]  JQG#:[id number]  Patient Care Team: Arnetha Courser, MD as PCP - General (Family Medicine) Murlean Iba, MD (Internal Medicine) Gae Dry, MD as Referring Physician (Obstetrics and Gynecology) Estill Cotta, MD (Ophthalmology) Anabel Bene, MD as Referring Physician (Neurology) Beverly Gust, MD (Otolaryngology) Lequita Asal, MD as Referring Physician (Hematology and Oncology) Brendolyn Patty, MD (Dermatology)  CHIEF COMPLAINT: Essential thrombocytosis  INTERVAL HISTORY: Patient is an 85 year old female is seen for the first time after transitioning physicians.  She continues to tolerate Hydrea well without significant side effects.  She currently feels well and is asymptomatic.  She has no neurologic complaints.  She denies any recent fevers or illnesses.  She has a good appetite and denies weight loss.  She has no chest pain or shortness of breath.  She denies any nausea, vomiting, constipation, or diarrhea.  She has no urinary complaints.  Patient feels at her baseline and offers no specific complaints today.  REVIEW OF SYSTEMS:   Review of Systems  Constitutional: Negative.  Negative for fever, malaise/fatigue and weight loss.  Respiratory: Negative.  Negative for cough, hemoptysis and shortness of breath.   Cardiovascular: Negative.  Negative for chest pain and leg swelling.  Gastrointestinal: Negative.  Negative for abdominal pain.  Genitourinary: Negative.  Negative for dysuria.  Musculoskeletal: Negative.  Negative for back pain.  Skin: Negative.  Negative for rash.  Neurological: Negative.  Negative for focal weakness, weakness and headaches.  Psychiatric/Behavioral: Negative.  The patient is not nervous/anxious.     As per HPI. Otherwise, a complete review of systems is negative.  PAST MEDICAL HISTORY: Past Medical  History:  Diagnosis Date  . Breast mass in female    right breast  . Cataract   . Difficult intubation 03/03/2016   September 06, 2013; left nasal fiberoptic intubation #7 ETT; see letter from Dr. Loanne Drilling, Dept of Anesthesiology, [facility]  . Diverticulitis   . Diverticulosis 05/28/2015  . Essential thrombocytosis ([place]) 07/23/2015  . Genital prolapse   . GERD (gastroesophageal reflux disease)   . 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 Mike Gip  . Hyperlipemia   . Hyperlipidemia    history of   . Hypothyroidism   . Mild atherosclerosis of carotid artery 06/25/2015  . Morton's neuroma   . MRI contraindicated due to metal implant   . Prolapse of uterus   . Radiculopathy   . Renal cyst   . Rosacea   . Shingles   . Thrombocytosis ([place])     PAST SURGICAL HISTORY: Past Surgical History:  Procedure Laterality Date  . ADENOIDECTOMY    . APPENDECTOMY    . biopsy, right breast     . CATARACT EXTRACTION W/PHACO Left 03/25/2015   Procedure: CATARACT EXTRACTION PHACO AND INTRAOCULAR LENS PLACEMENT (IOC);  Surgeon: Estill Cotta, MD;  Location: [facility] ORS;  Service: Ophthalmology;  Laterality: Left;  [place] 02:09 AP% 27.3 CDE 59.97 fluid pack lot #[id number]  . CESAREAN SECTION    . COLON SURGERY    . JOINT REPLACEMENT Right    total hip  . THYROID LOBECTOMY    . THYROIDECTOMY     partial  . TONSILLECTOMY    . TOTAL HIP ARTHROPLASTY    . UPPER GI ENDOSCOPY  10/17/2008    FAMILY HISTORY: Family History  Problem Relation Age of Onset  . Stroke Mother   . Stroke Father   .  Dementia Maternal Grandfather     ADVANCED DIRECTIVES (Y/N):  N  HEALTH MAINTENANCE: Social History   Tobacco Use  . Smoking status: Former Research scientist (life sciences)  . Smokeless tobacco: Never Used  Substance Use Topics  . Alcohol use: No    Alcohol/week: 0.0 standard drinks  . Drug use: No     Colonoscopy:  PAP:  Bone density:  Lipid panel:  Allergies  Allergen Reactions  . Novocain [Procaine] Palpitations     Current Outpatient Medications  Medication Sig Dispense Refill  . amLODipine (NORVASC) 5 MG tablet TAKE ONE TABLET BY MOUTH ONCE DAILY 90 tablet 3  . aspirin EC 81 MG tablet Take 81 mg by mouth daily.     Marland Kitchen atorvastatin (LIPITOR) 20 MG tablet TAKE 1 TABLET BY MOUTH AT BEDTIME 90 tablet 1  . cetirizine HCl (CETIRIZINE HCL CHILDRENS ALRGY) 5 MG/5ML SOLN Take 5 mg daily by mouth.     . Cholecalciferol (VITAMIN D3) 1000 units CAPS Take by mouth daily.     . famotidine (PEPCID) 20 MG tablet Take 1 tablet (20 mg total) by mouth 2 (two) times daily. 60 tablet 2  . hydroxyurea (HYDREA) 500 MG capsule Take 1 capsule (500 mg total) by mouth daily. Take 1 capsule on Monday, Wednesday, Friday and Saturday. 30 capsule 3  . irbesartan (AVAPRO) 150 MG tablet Take 1 tablet (150 mg total) by mouth daily. 90 tablet 5  . ketoconazole (NIZORAL) 2 % shampoo Apply 1 application topically 2 (two) times a week.     . levothyroxine (SYNTHROID, LEVOTHROID) 88 MCG tablet TAKE 1 TABLET BY MOUTH ONCE DAILY BEFORE BREAKFAST 90 tablet 3  . polyethylene glycol powder (GLYCOLAX/MIRALAX) powder Take 1 Container by mouth once.    . diclofenac sodium (VOLTAREN) 1 % GEL Apply 2-4 grams over the knee(s) four times a day if needed for pain (Patient not taking: Reported on 12/24/2017) 100 g 2  . fluticasone (FLONASE) 50 MCG/ACT nasal spray Place 2 sprays into both nostrils daily. (Patient not taking: Reported on 01/07/2018) 16 g 6   No current facility-administered medications for this visit.     OBJECTIVE: Vitals:   02/28/18 1439  BP: 133/76  Pulse: 80  Resp: 18  Temp: (!) 97.4 F (36.3 C)     Body mass index is 24.92 kg/m.    ECOG FS:0 - Asymptomatic  General: Well-developed, well-nourished, no acute distress. Eyes: Pink conjunctiva, anicteric sclera. HEENT: Normocephalic, moist mucous membranes, clear oropharnyx. Lungs: Clear to auscultation bilaterally. Heart: Regular rate and rhythm. No rubs, murmurs, or  gallops. Abdomen: Soft, nontender, nondistended. No organomegaly noted, normoactive bowel sounds. Musculoskeletal: No edema, cyanosis, or clubbing. Neuro: Alert, answering all questions appropriately. Cranial nerves grossly intact. Skin: No rashes or petechiae noted. Psych: Normal affect. Lymphatics: No cervical, calvicular, axillary or inguinal LAD.   LAB RESULTS:  Lab Results  Component Value Date   NA 139 02/28/2018   K 4.3 02/28/2018   CL 108 02/28/2018   CO2 23 02/28/2018   GLUCOSE 169 (H) 02/28/2018   BUN 40 (H) 02/28/2018   CREATININE 1.46 (H) 02/28/2018   CALCIUM 9.3 02/28/2018   PROT 7.2 02/28/2018   ALBUMIN 4.3 02/28/2018   AST 21 02/28/2018   ALT 15 02/28/2018   ALKPHOS 74 02/28/2018   BILITOT 1.2 02/28/2018   GFRNONAA 32 (L) 02/28/2018   GFRAA 37 (L) 02/28/2018    Lab Results  Component Value Date   WBC 8.5 02/28/2018   NEUTROABS 6.4 02/28/2018   HGB  13.1 02/28/2018   HCT 39.7 02/28/2018   MCV 94.3 02/28/2018   PLT 352 02/28/2018     STUDIES: No results found.  ASSESSMENT: Essential thrombocytosis  PLAN:   1. Essential thrombocytosis: Patient was recently on Hydrea 500 mg 4 days a week, and she wishes to transition back to 3 days a week on Monday, Wednesday, and Friday.  She is also been instructed to continue 81 mg aspirin.  No further interventions are needed.  Return to clinic in 3 months for laboratory work only and then in 6 months for laboratory work and further evaluation.  I spent a total of 30 minutes face-to-face with the patient of which greater than 50% of the visit was spent in counseling and coordination of care as detailed above.   Patient expressed understanding and was in agreement with this plan. She also understands that She can call clinic at any time with any questions, concerns, or complaints.    Lloyd Huger, MD   03/03/2018 6:23 AM

## 2018-02-28 ENCOUNTER — Encounter: Payer: Self-pay | Admitting: Oncology

## 2018-02-28 ENCOUNTER — Ambulatory Visit: Payer: Medicare HMO | Admitting: Hematology and Oncology

## 2018-02-28 ENCOUNTER — Other Ambulatory Visit: Payer: Medicare HMO

## 2018-02-28 ENCOUNTER — Other Ambulatory Visit: Payer: Self-pay

## 2018-02-28 ENCOUNTER — Inpatient Hospital Stay: Payer: Medicare HMO | Attending: Hematology and Oncology

## 2018-02-28 ENCOUNTER — Inpatient Hospital Stay (HOSPITAL_BASED_OUTPATIENT_CLINIC_OR_DEPARTMENT_OTHER): Payer: Medicare HMO | Admitting: Oncology

## 2018-02-28 VITALS — BP 133/76 | HR 80 | Temp 97.4°F | Resp 18 | Wt 145.2 lb

## 2018-02-28 DIAGNOSIS — D473 Essential (hemorrhagic) thrombocythemia: Secondary | ICD-10-CM

## 2018-02-28 DIAGNOSIS — D75839 Thrombocytosis, unspecified: Secondary | ICD-10-CM

## 2018-02-28 DIAGNOSIS — Z87891 Personal history of nicotine dependence: Secondary | ICD-10-CM

## 2018-02-28 LAB — COMPREHENSIVE METABOLIC PANEL
ALT: 15 U/L (ref 0–44)
AST: 21 U/L (ref 15–41)
Albumin: 4.3 g/dL (ref 3.5–5.0)
Alkaline Phosphatase: 74 U/L (ref 38–126)
Anion gap: 8 (ref 5–15)
BUN: 40 mg/dL — ABNORMAL HIGH (ref 8–23)
CO2: 23 mmol/L (ref 22–32)
Calcium: 9.3 mg/dL (ref 8.9–10.3)
Chloride: 108 mmol/L (ref 98–111)
Creatinine, Ser: 1.46 mg/dL — ABNORMAL HIGH (ref 0.44–1.00)
GFR calc Af Amer: 37 mL/min — ABNORMAL LOW (ref >60)
GFR calc non Af Amer: 32 mL/min — ABNORMAL LOW (ref >60)
Glucose, Bld: 169 mg/dL — ABNORMAL HIGH (ref 70–99)
Potassium: 4.3 mmol/L (ref 3.5–5.1)
Sodium: 139 mmol/L (ref 135–145)
Total Bilirubin: 1.2 mg/dL (ref 0.3–1.2)
Total Protein: 7.2 g/dL (ref 6.5–8.1)

## 2018-02-28 LAB — CBC WITH DIFFERENTIAL/PLATELET
Abs Immature Granulocytes: 0.03 10*3/uL (ref 0.00–0.07)
Basophils Absolute: 0.1 10*3/uL (ref 0.0–0.1)
Basophils Relative: 1 %
Eosinophils Absolute: 0.2 10*3/uL (ref 0.0–0.5)
Eosinophils Relative: 2 %
HCT: 39.7 % (ref 36.0–46.0)
Hemoglobin: 13.1 g/dL (ref 12.0–15.0)
Immature Granulocytes: 0 %
Lymphocytes Relative: 15 %
Lymphs Abs: 1.3 10*3/uL (ref 0.7–4.0)
MCH: 31.1 pg (ref 26.0–34.0)
MCHC: 33 g/dL (ref 30.0–36.0)
MCV: 94.3 fL (ref 80.0–100.0)
Monocytes Absolute: 0.6 10*3/uL (ref 0.1–1.0)
Monocytes Relative: 7 %
Neutro Abs: 6.4 10*3/uL (ref 1.7–7.7)
Neutrophils Relative %: 75 %
Platelets: 352 10*3/uL (ref 150–400)
RBC: 4.21 MIL/uL (ref 3.87–5.11)
RDW: 13.8 % (ref 11.5–15.5)
WBC: 8.5 10*3/uL (ref 4.0–10.5)
nRBC: 0 % (ref 0.0–0.2)

## 2018-02-28 MED ORDER — HYDROXYUREA 500 MG PO CAPS: 500.0000 mg | ORAL_CAPSULE | Freq: Every day | ORAL | 3 refills | Status: DC

## 2018-02-28 NOTE — Progress Notes (Signed)
Patient denies any concerns today.  

## 2018-03-03 DIAGNOSIS — R69 Illness, unspecified: Secondary | ICD-10-CM | POA: Diagnosis not present

## 2018-03-22 DIAGNOSIS — D229 Melanocytic nevi, unspecified: Secondary | ICD-10-CM | POA: Diagnosis not present

## 2018-03-22 DIAGNOSIS — Z85828 Personal history of other malignant neoplasm of skin: Secondary | ICD-10-CM | POA: Diagnosis not present

## 2018-03-22 DIAGNOSIS — Z1283 Encounter for screening for malignant neoplasm of skin: Secondary | ICD-10-CM | POA: Diagnosis not present

## 2018-03-22 DIAGNOSIS — D1800 Hemangioma unspecified site: Secondary | ICD-10-CM | POA: Diagnosis not present

## 2018-03-22 DIAGNOSIS — L720 Epidermal cyst: Secondary | ICD-10-CM | POA: Diagnosis not present

## 2018-03-22 DIAGNOSIS — L300 Nummular dermatitis: Secondary | ICD-10-CM | POA: Diagnosis not present

## 2018-03-22 DIAGNOSIS — I8393 Asymptomatic varicose veins of bilateral lower extremities: Secondary | ICD-10-CM | POA: Diagnosis not present

## 2018-03-22 DIAGNOSIS — L219 Seborrheic dermatitis, unspecified: Secondary | ICD-10-CM | POA: Diagnosis not present

## 2018-03-22 DIAGNOSIS — D692 Other nonthrombocytopenic purpura: Secondary | ICD-10-CM | POA: Diagnosis not present

## 2018-03-22 DIAGNOSIS — I781 Nevus, non-neoplastic: Secondary | ICD-10-CM | POA: Diagnosis not present

## 2018-03-23 ENCOUNTER — Other Ambulatory Visit: Payer: Self-pay | Admitting: Family Medicine

## 2018-03-28 ENCOUNTER — Telehealth: Payer: Self-pay

## 2018-03-28 NOTE — Telephone Encounter (Signed)
Please let her know that I'll suggest 18 mmHg first. Those will be easier to put on. If those help, great. If not enough compression, the next strength is 20-30 mmHg.

## 2018-03-28 NOTE — Telephone Encounter (Signed)
Copied from [place]. Topic: General - Other >> Mar 25, 2018  8:54 AM Carolyn Stare wrote:  Pt said Dr Sanda Klein suggested her getting support stockings and she went to [facility] and they did not the pressure. Pt would like a call back >> Mar 25, 2018  4:18 PM Bea Graff, NT wrote: Pt checking status on hearing back about which stockings Dr. Sanda Klein recommends.

## 2018-03-28 NOTE — Telephone Encounter (Signed)
Left voice mail

## 2018-04-08 ENCOUNTER — Ambulatory Visit: Payer: Medicare HMO | Admitting: Obstetrics & Gynecology

## 2018-04-08 ENCOUNTER — Encounter: Payer: Self-pay | Admitting: Obstetrics & Gynecology

## 2018-04-08 VITALS — BP 130/80 | Ht 64.0 in | Wt 148.0 lb

## 2018-04-08 DIAGNOSIS — N814 Uterovaginal prolapse, unspecified: Secondary | ICD-10-CM | POA: Diagnosis not present

## 2018-04-08 NOTE — Progress Notes (Signed)
HPI:      [name] is a 85 y.o. [phone] who presents today for her pessary follow up and examination related to her pelvic floor weakening.  Pt reports tolerating the pessary well with  no vaginal bleeding and  no vaginal discharge.  Symptoms of pelvic floor weakening have greatly improved. She is voiding and defecating without difficulty. She currently has a Ring #3 pessary.  PMHx: She  has a past medical history of Breast mass in female, Cataract, Difficult intubation (03/03/2016), Diverticulitis, Diverticulosis (05/28/2015), Essential thrombocytosis ([place]) (07/23/2015), Genital prolapse, GERD (gastroesophageal reflux disease), Rosanna Randy syndrome (06/30/2016), Hyperlipemia, Hyperlipidemia, Hypothyroidism, Mild atherosclerosis of carotid artery (06/25/2015), Morton's neuroma, MRI contraindicated due to metal implant, Prolapse of uterus, Radiculopathy, Renal cyst, Rosacea, Shingles, and Thrombocytosis ([place]). Also,  has a past surgical history that includes Tonsillectomy; Appendectomy; Cesarean section; Joint replacement (Right); Thyroidectomy; Colon surgery; Cataract extraction w/PHACO (Left, 03/25/2015); Adenoidectomy; biopsy, right breast ; Total hip arthroplasty; Thyroid lobectomy; and Upper gi endoscopy (10/17/2008)., family history includes Dementia in her maternal grandfather; Stroke in her father and mother.,  reports that she has quit smoking. She has never used smokeless tobacco. She reports that she does not drink alcohol or use drugs.  She has a current medication list which includes the following prescription(s): amlodipine, aspirin ec, atorvastatin, cetirizine hcl, vitamin d3, diclofenac sodium, famotidine, fluticasone, hydroxyurea, irbesartan, ketoconazole, levothyroxine, and polyethylene glycol powder. Also, is allergic to novocain [procaine].  Review of Systems  All other systems reviewed and are negative.   Objective: BP 130/80   Ht 5\' 4"  (1.626 m)   Wt 148 lb (67.1 kg)   BMI 25.40  kg/m  Physical Exam Constitutional:      General: She is not in acute distress.    Appearance: She is well-developed.  Genitourinary:     Pelvic exam was performed with patient supine.     Vagina and uterus normal.     No vaginal erythema or bleeding.     No cervical motion tenderness, discharge, polyp or nabothian cyst.     Uterus is mobile.     Uterus is not enlarged.     No uterine mass detected.    Uterus is midaxial.     No right or left adnexal mass present.     Right adnexa not tender.     Left adnexa not tender.     Genitourinary Comments: Uterine prolapse and cystocele present Cervix normal No erosions Min atrophy  HENT:     Head: Normocephalic and atraumatic.     Nose: Nose normal.  Abdominal:     General: There is no distension.     Palpations: Abdomen is soft.     Tenderness: There is no abdominal tenderness.  Musculoskeletal: Normal range of motion.  Neurological:     Mental Status: She is alert and oriented to person, place, and time.     Cranial Nerves: No cranial nerve deficit.  Skin:    General: Skin is warm and dry.   Pessary Care Pessary removed and cleaned.  Vagina checked - without erosions - pessary replaced.  A/P:1. Uterine prolapse 2. Cystocele with prolapse  Pessary was cleaned and replaced today. Instructions given for care. Concerning symptoms to observe for are counseled to patient. Follow up scheduled for 3 months.  A total of 15 minutes were spent face-to-face with the patient during this encounter and over half of that time dealt with counseling and coordination of care.  Barnett Applebaum, MD, [facility] Ob/Gyn,  [facility] 04/08/2018  10:39 AM

## 2018-04-15 ENCOUNTER — Ambulatory Visit: Payer: Medicare HMO | Admitting: Family Medicine

## 2018-04-21 ENCOUNTER — Encounter: Payer: Self-pay | Admitting: Family Medicine

## 2018-04-21 ENCOUNTER — Ambulatory Visit (INDEPENDENT_AMBULATORY_CARE_PROVIDER_SITE_OTHER): Payer: Medicare HMO | Admitting: Family Medicine

## 2018-04-21 VITALS — BP 126/68 | HR 78 | Temp 97.8°F | Ht 64.0 in | Wt 146.1 lb

## 2018-04-21 DIAGNOSIS — M5412 Radiculopathy, cervical region: Secondary | ICD-10-CM | POA: Diagnosis not present

## 2018-04-21 DIAGNOSIS — Z66 Do not resuscitate: Secondary | ICD-10-CM

## 2018-04-21 DIAGNOSIS — I10 Essential (primary) hypertension: Secondary | ICD-10-CM | POA: Diagnosis not present

## 2018-04-21 DIAGNOSIS — I878 Other specified disorders of veins: Secondary | ICD-10-CM | POA: Diagnosis not present

## 2018-04-21 DIAGNOSIS — N183 Chronic kidney disease, stage 3 unspecified: Secondary | ICD-10-CM

## 2018-04-21 DIAGNOSIS — Z1239 Encounter for other screening for malignant neoplasm of breast: Secondary | ICD-10-CM

## 2018-04-21 DIAGNOSIS — M4722 Other spondylosis with radiculopathy, cervical region: Secondary | ICD-10-CM | POA: Diagnosis not present

## 2018-04-21 DIAGNOSIS — R42 Dizziness and giddiness: Secondary | ICD-10-CM

## 2018-04-21 NOTE — Assessment & Plan Note (Signed)
Signed compression stocking form, 16-20 mmHg

## 2018-04-21 NOTE — Assessment & Plan Note (Signed)
Refer to PT

## 2018-04-21 NOTE — Patient Instructions (Addendum)
Get rid of the spray deodorant  We'll have you see Joneen Boers, PT Just ask for the number and you can schedule an appointment  Try turmeric as a natural anti-inflammatory (for pain and arthritis). It comes in capsules where you buy aspirin and fish oil, but also as a spice where you buy pepper and garlic powder.  Return in May and we'll get labs then

## 2018-04-21 NOTE — Progress Notes (Signed)
BP 126/68   Pulse 78   Temp 97.8 F (36.6 C)   Ht 5\' 4"  (1.626 m)   Wt 146 lb 1.6 oz (66.3 kg)   SpO2 97%   BMI 25.08 kg/m    Subjective:    Patient ID: Gina [name], female    DOB: [birth year], 85 y.o.   MRN: [id number]  HPI: Gina [name] is a 85 y.o. female  Chief Complaint  Patient presents with  . Hip Pain    Left  . Follow-up  . paperwork    stockings form signed, also would like letter for mail to be delivered to her door  . Dizziness    when turns head to the left and bends head down    HPI  Patient is here for follow-up  Venous stasis and going to get 16-20 mmHg socks, loves them so much better; no edema  DNR paperwork here today; confirmed  She has vertigo and has had it before; iut has come back, but it's not as bad as before; she hears cracking in her ears, like something crunching in the neck; pain and tightness down the left shoulder; bothering her for weeks; does some exercises and stretching; turning head to the left hurts  Cannot straighten the fingers of the RIGHT hand; if it's not going to kill her  Hiccups a lot; thinks it is gastric reason; has acid reflux; using famotidine now Loud hiccup; not painful; thinks it is after eating; does not want to see GI ("I have one")  Left hip, saw orthopaedist, Dr. Harlow Mares was not keen on operating on her  Postnasal drip and cough; thinks it is after taking pills; no phlegm, just dry; spraying underarm deodorant  High cholesterol Lab Results  Component Value Date   CHOL 158 01/07/2018   HDL 53 01/07/2018   [place] 80 01/07/2018   TRIG 151 (H) 01/07/2018   CHOLHDL 3.0 01/07/2018   Lightheaded, thinks it is tighness in the neck and will see eye doctor, has convereegnce issues; already saw ENT, did all the testing  [place] to go the mailbox to get her mail; trouble walking from the hip and left leg  Depression screen [facility] 2/9 04/21/2018 01/07/2018 12/24/2017 04/20/2017 01/05/2017  Decreased Interest 0 0 0  0 0  Down, Depressed, Hopeless 0 0 0 3 0  PHQ - 2 Score 0 0 0 3 0  Altered sleeping 0 0 0 1 -  Tired, decreased energy 0 0 0 0 -  Change in appetite 0 0 0 0 -  Feeling bad or failure about yourself  0 0 0 0 -  Trouble concentrating 0 0 0 0 -  Moving slowly or fidgety/restless 0 0 0 0 -  Suicidal thoughts 0 0 0 0 -  PHQ-9 Score 0 0 0 4 -  Difficult doing work/chores Not difficult at all Not difficult at all Not difficult at all Not difficult at all -   Fall Risk  04/21/2018 01/07/2018 12/24/2017 04/20/2017 01/05/2017  Falls in the past year? 0 0 0 Yes No  Number falls in past yr: - 0 0 2 or more -  Injury with Fall? - 0 - No -    Relevant past medical, surgical, family and social history reviewed Past Medical History:  Diagnosis Date  . Breast mass in female    right breast  . Cataract   . Difficult intubation 03/03/2016   September 06, 2013; left nasal fiberoptic intubation #7 ETT; see letter from Dr.  Loanne Drilling, Dept of Anesthesiology, [facility]  . Diverticulitis   . Diverticulosis 05/28/2015  . Essential thrombocytosis ([place]) 07/23/2015  . Genital prolapse   . GERD (gastroesophageal reflux disease)   . 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 Mike Gip  . Hyperlipemia   . Hyperlipidemia    history of   . Hypothyroidism   . Mild atherosclerosis of carotid artery 06/25/2015  . Morton's neuroma   . MRI contraindicated due to metal implant   . Prolapse of uterus   . Radiculopathy   . Renal cyst   . Rosacea   . Shingles   . Thrombocytosis ([place])    Past Surgical History:  Procedure Laterality Date  . ADENOIDECTOMY    . APPENDECTOMY    . biopsy, right breast     . CATARACT EXTRACTION W/PHACO Left 03/25/2015   Procedure: CATARACT EXTRACTION PHACO AND INTRAOCULAR LENS PLACEMENT (IOC);  Surgeon: Estill Cotta, MD;  Location: [facility] ORS;  Service: Ophthalmology;  Laterality: Left;  [place] 02:09 AP% 27.3 CDE 59.97 fluid pack lot #[id number]  . CESAREAN SECTION    . COLON SURGERY    . JOINT  REPLACEMENT Right    total hip  . THYROID LOBECTOMY    . THYROIDECTOMY     partial  . TONSILLECTOMY    . TOTAL HIP ARTHROPLASTY    . UPPER GI ENDOSCOPY  10/17/2008   Family History  Problem Relation Age of Onset  . Stroke Mother   . Stroke Father   . Dementia Maternal Grandfather    Social History   Tobacco Use  . Smoking status: Former Research scientist (life sciences)  . Smokeless tobacco: Never Used  Substance Use Topics  . Alcohol use: No    Alcohol/week: 0.0 standard drinks  . Drug use: No     [facility] Visit from 04/21/2018 in [facility]  AUDIT-C Score  0      Interim medical history since last visit reviewed. Allergies and medications reviewed  Review of Systems Per HPI unless specifically indicated above     Objective:    BP 126/68   Pulse 78   Temp 97.8 F (36.6 C)   Ht 5\' 4"  (1.626 m)   Wt 146 lb 1.6 oz (66.3 kg)   SpO2 97%   BMI 25.08 kg/m   Wt Readings from Last 3 Encounters:  04/21/18 146 lb 1.6 oz (66.3 kg)  04/08/18 148 lb (67.1 kg)  02/28/18 145 lb 3.2 oz (65.9 kg)    Physical Exam Constitutional:      General: She is not in acute distress.    Appearance: She is well-developed. She is not diaphoretic.  HENT:     Head: Normocephalic and atraumatic.  Eyes:     General: No scleral icterus. Neck:     Thyroid: No thyromegaly.  Cardiovascular:     Rate and Rhythm: Normal rate and regular rhythm.     Heart sounds: Normal heart sounds. No murmur.  Pulmonary:     Effort: Pulmonary effort is normal. No respiratory distress.     Breath sounds: Normal breath sounds. No wheezing.  Abdominal:     General: Bowel sounds are normal. There is no distension.     Palpations: Abdomen is soft.  Skin:    General: Skin is warm and dry.     Coloration: Skin is not pale.  Neurological:     Mental Status: She is alert.  Psychiatric:        Mood and Affect: Mood is not  anxious or depressed.        Behavior: Behavior normal.        Thought Content: Thought  content normal.        Cognition and Memory: Memory is not impaired.        Judgment: Judgment normal.     Results for orders placed or performed in visit on 02/28/18  Comprehensive metabolic panel  Result Value Ref Range   Sodium 139 135 - 145 mmol/L   Potassium 4.3 3.5 - 5.1 mmol/L   Chloride 108 98 - 111 mmol/L   CO2 23 22 - 32 mmol/L   Glucose, Bld 169 (H) 70 - 99 mg/dL   BUN 40 (H) 8 - 23 mg/dL   Creatinine, Ser 1.46 (H) 0.44 - 1.00 mg/dL   Calcium 9.3 8.9 - 10.3 mg/dL   Total Protein 7.2 6.5 - 8.1 g/dL   Albumin 4.3 3.5 - 5.0 g/dL   AST 21 15 - 41 U/L   ALT 15 0 - 44 U/L   Alkaline Phosphatase 74 38 - 126 U/L   Total Bilirubin 1.2 0.3 - 1.2 mg/dL   GFR calc non Af Amer 32 (L) >60 mL/min   GFR calc Af Amer 37 (L) >60 mL/min   Anion gap 8 5 - 15  CBC with Differential  Result Value Ref Range   WBC 8.5 4.0 - 10.5 K/uL   RBC 4.21 3.87 - 5.11 MIL/uL   Hemoglobin 13.1 12.0 - 15.0 g/dL   HCT 39.7 36.0 - 46.0 %   MCV 94.3 80.0 - 100.0 fL   MCH 31.1 26.0 - 34.0 pg   MCHC 33.0 30.0 - 36.0 g/dL   RDW 13.8 11.5 - 15.5 %   Platelets 352 150 - 400 K/uL   nRBC 0.0 0.0 - 0.2 %   Neutrophils Relative % 75 %   Neutro Abs 6.4 1.7 - 7.7 K/uL   Lymphocytes Relative 15 %   Lymphs Abs 1.3 0.7 - 4.0 K/uL   Monocytes Relative 7 %   Monocytes Absolute 0.6 0.1 - 1.0 K/uL   Eosinophils Relative 2 %   Eosinophils Absolute 0.2 0.0 - 0.5 K/uL   Basophils Relative 1 %   Basophils Absolute 0.1 0.0 - 0.1 K/uL   Immature Granulocytes 0 %   Abs Immature Granulocytes 0.03 0.00 - 0.07 K/uL      Assessment & Plan:   Problem List Items Addressed This Visit      Cardiovascular and Mediastinum   Essential hypertension (Chronic)    Controlled, chronic, stable        Nervous and Auditory   Cervical radiculopathy due to degenerative joint disease of spine    Refer to PT      Relevant Orders   Ambulatory referral to Physical Therapy     Genitourinary   CKD (chronic kidney disease),  stage III ([place])    Managed by nephrologist        Other   Venous stasis - Primary    Signed compression stocking form, 16-20 mmHg      DNR (do not resuscitate)    She brought in paperwork to be scanned into the chart; she does not want resuscitative efforts; she wishes to be DNR      Relevant Orders   DNR (Do Not Resuscitate)   Dizziness    Requesting ENT work-up; she will see PT to see if some component of cervical spine issues may contribute       Other  Visit Diagnoses    Cervical radicular pain       refer to PT   Relevant Orders   Ambulatory referral to Physical Therapy   Screening for breast cancer       Relevant Orders   MM 3D SCREEN BREAST BILATERAL       Follow up plan: No follow-ups on file.  An after-visit summary was printed and given to the patient at [place].  Please see the patient instructions which may contain other information and recommendations beyond what is mentioned above in the assessment and plan.  No orders of the defined types were placed in this encounter.   Orders Placed This Encounter  Procedures  . MM 3D SCREEN BREAST BILATERAL  . Ambulatory referral to Physical Therapy  . DNR (Do Not Resuscitate)

## 2018-04-22 DIAGNOSIS — Z66 Do not resuscitate: Secondary | ICD-10-CM | POA: Insufficient documentation

## 2018-04-22 NOTE — Assessment & Plan Note (Signed)
She brought in paperwork to be scanned into the chart; she does not want resuscitative efforts; she wishes to be DNR

## 2018-04-22 NOTE — Assessment & Plan Note (Signed)
Controlled, chronic, stable

## 2018-04-22 NOTE — Assessment & Plan Note (Signed)
Requesting ENT work-up; she will see PT to see if some component of cervical spine issues may contribute

## 2018-04-22 NOTE — Assessment & Plan Note (Signed)
Managed by nephrologist 

## 2018-04-26 ENCOUNTER — Encounter: Payer: Self-pay | Admitting: Family Medicine

## 2018-04-29 DIAGNOSIS — Z1231 Encounter for screening mammogram for malignant neoplasm of breast: Secondary | ICD-10-CM | POA: Diagnosis not present

## 2018-05-02 ENCOUNTER — Other Ambulatory Visit: Payer: Self-pay | Admitting: Obstetrics & Gynecology

## 2018-05-03 ENCOUNTER — Other Ambulatory Visit: Payer: Self-pay | Admitting: Family Medicine

## 2018-05-04 ENCOUNTER — Telehealth: Payer: Self-pay

## 2018-05-04 NOTE — Telephone Encounter (Signed)
Pt would like to discuss mammogram results c PH.  Needs additional views.  [phone]

## 2018-05-18 DIAGNOSIS — N6321 Unspecified lump in the left breast, upper outer quadrant: Secondary | ICD-10-CM | POA: Diagnosis not present

## 2018-05-18 DIAGNOSIS — R921 Mammographic calcification found on diagnostic imaging of breast: Secondary | ICD-10-CM | POA: Diagnosis not present

## 2018-05-18 DIAGNOSIS — R928 Other abnormal and inconclusive findings on diagnostic imaging of breast: Secondary | ICD-10-CM | POA: Diagnosis not present

## 2018-05-24 DIAGNOSIS — R928 Other abnormal and inconclusive findings on diagnostic imaging of breast: Secondary | ICD-10-CM | POA: Diagnosis not present

## 2018-05-25 DIAGNOSIS — N6321 Unspecified lump in the left breast, upper outer quadrant: Secondary | ICD-10-CM | POA: Diagnosis not present

## 2018-05-25 DIAGNOSIS — R928 Other abnormal and inconclusive findings on diagnostic imaging of breast: Secondary | ICD-10-CM | POA: Diagnosis not present

## 2018-05-25 DIAGNOSIS — N6092 Unspecified benign mammary dysplasia of left breast: Secondary | ICD-10-CM | POA: Diagnosis not present

## 2018-05-25 DIAGNOSIS — D242 Benign neoplasm of left breast: Secondary | ICD-10-CM | POA: Diagnosis not present

## 2018-05-27 ENCOUNTER — Telehealth: Payer: Self-pay | Admitting: Obstetrics & Gynecology

## 2018-05-27 NOTE — Telephone Encounter (Signed)
Let her know we will know closer to that time, and for her and her pessary it could wait til June if necessary

## 2018-05-27 NOTE — Telephone Encounter (Signed)
Patient is calling to find out the status of her appointment 07/06/18 due to our current status with the Covid 19. Patient states she is not wanting an office visit for her follow up if we are still going through the quarantine at that time. She also say she want you and everyone to be safe!

## 2018-05-30 ENCOUNTER — Other Ambulatory Visit: Payer: Medicare HMO

## 2018-06-02 ENCOUNTER — Other Ambulatory Visit: Payer: Medicare HMO

## 2018-06-20 ENCOUNTER — Other Ambulatory Visit: Payer: Self-pay | Admitting: Family Medicine

## 2018-06-25 ENCOUNTER — Other Ambulatory Visit: Payer: Self-pay | Admitting: Family Medicine

## 2018-06-30 ENCOUNTER — Telehealth: Payer: Self-pay

## 2018-06-30 NOTE — Telephone Encounter (Signed)
Can you reschedule till june

## 2018-06-30 NOTE — Telephone Encounter (Signed)
Patient has been reschedule to June 17 with [facility]

## 2018-07-06 ENCOUNTER — Ambulatory Visit: Payer: Medicare HMO | Admitting: Obstetrics & Gynecology

## 2018-07-08 ENCOUNTER — Ambulatory Visit: Payer: Medicare HMO

## 2018-07-08 ENCOUNTER — Ambulatory Visit: Payer: Medicare HMO | Admitting: Family Medicine

## 2018-07-27 ENCOUNTER — Other Ambulatory Visit: Payer: Self-pay | Admitting: Family Medicine

## 2018-08-10 ENCOUNTER — Ambulatory Visit: Payer: Medicare HMO | Admitting: Obstetrics & Gynecology

## 2018-08-10 ENCOUNTER — Encounter: Payer: Self-pay | Admitting: Obstetrics & Gynecology

## 2018-08-10 ENCOUNTER — Other Ambulatory Visit: Payer: Self-pay

## 2018-08-10 VITALS — BP 118/80 | Ht 64.0 in | Wt 143.0 lb

## 2018-08-10 DIAGNOSIS — N814 Uterovaginal prolapse, unspecified: Secondary | ICD-10-CM | POA: Diagnosis not present

## 2018-08-10 NOTE — Progress Notes (Signed)
  HPI:      [name] is a 85 y.o. [phone] who presents today for her pessary follow up and examination related to her pelvic floor weakening.  Pt reports tolerating the pessary well with  no vaginal bleeding and  no vaginal discharge.  Symptoms of pelvic floor weakening have greatly improved. She is voiding and defecating without difficulty. She currently has a #3 ring type pessary.  PMHx: She  has a past medical history of Breast mass in female, Cataract, Difficult intubation (03/03/2016), Diverticulitis, Diverticulosis (05/28/2015), Essential thrombocytosis ([place]) (07/23/2015), Genital prolapse, GERD (gastroesophageal reflux disease), Rosanna Randy syndrome (06/30/2016), Hyperlipemia, Hyperlipidemia, Hypothyroidism, Mild atherosclerosis of carotid artery (06/25/2015), Morton's neuroma, MRI contraindicated due to metal implant, Prolapse of uterus, Radiculopathy, Renal cyst, Rosacea, Shingles, and Thrombocytosis ([place]). Also,  has a past surgical history that includes Tonsillectomy; Appendectomy; Cesarean section; Joint replacement (Right); Thyroidectomy; Colon surgery; Cataract extraction w/PHACO (Left, 03/25/2015); Adenoidectomy; biopsy, right breast ; Total hip arthroplasty; Thyroid lobectomy; and Upper gi endoscopy (10/17/2008)., family history includes Dementia in her maternal grandfather; Stroke in her father and mother.,  reports that she has quit smoking. She has never used smokeless tobacco. She reports that she does not drink alcohol or use drugs.  She has a current medication list which includes the following prescription(s): amlodipine, aspirin ec, atorvastatin, vitamin d3, famotidine, hydroxyurea, irbesartan, ketoconazole, levothyroxine, and polyethylene glycol powder. Also, is allergic to novocain [procaine].  Review of Systems  All other systems reviewed and are negative.   Objective: BP 118/80   Ht 5\' 4"  (1.626 m)   Wt 143 lb (64.9 kg)   BMI 24.55 kg/m  Physical Exam Constitutional:    General: She is not in acute distress.    Appearance: She is well-developed.  Genitourinary:     Pelvic exam was performed with patient supine.     Vagina and uterus normal.     No vaginal erythema or bleeding.     No cervical motion tenderness, discharge, polyp or nabothian cyst.     Uterus is mobile.     Uterus is not enlarged.     No uterine mass detected.    Uterus is midaxial.     No right or left adnexal mass present.     Right adnexa not tender.     Left adnexa not tender.     Genitourinary Comments: Gr 2 uterine prolapse Gr 2 cystocele  HENT:     Head: Normocephalic and atraumatic.     Nose: Nose normal.  Abdominal:     General: There is no distension.     Palpations: Abdomen is soft.     Tenderness: There is no abdominal tenderness.  Musculoskeletal: Normal range of motion.  Neurological:     Mental Status: She is alert and oriented to person, place, and time.     Cranial Nerves: No cranial nerve deficit.  Skin:    General: Skin is warm and dry.     Pessary Care Pessary removed and cleaned.  Vagina checked - without erosions - pessary replaced.  A/P:1. Uterine prolapse 2. Cystocele with prolapse Pessary was cleaned and replaced today. Instructions given for care. Concerning symptoms to observe for are counseled to patient. Follow up scheduled for 3 months.  A total of 15 minutes were spent face-to-face with the patient during this encounter and over half of that time dealt with counseling and coordination of care.  Barnett Applebaum, MD, [facility], [facility] 08/10/2018  10:58 AM

## 2018-08-19 ENCOUNTER — Other Ambulatory Visit: Payer: Self-pay | Admitting: Hematology and Oncology

## 2018-08-19 DIAGNOSIS — D75839 Thrombocytosis, unspecified: Secondary | ICD-10-CM

## 2018-08-19 DIAGNOSIS — D473 Essential (hemorrhagic) thrombocythemia: Secondary | ICD-10-CM

## 2018-09-04 NOTE — Progress Notes (Signed)
[facility]  [phone] [phone]  ID: [name] OB: [id number]  MR#: [id number]  GEX#:[id number]  Patient Care Team: Arnetha Courser, MD as PCP - General (Family Medicine) Murlean Iba, MD (Internal Medicine) Gae Dry, MD as Referring Physician (Obstetrics and Gynecology) Estill Cotta, MD (Ophthalmology) Anabel Bene, MD as Referring Physician (Neurology) Beverly Gust, MD (Otolaryngology) Brendolyn Patty, MD (Dermatology) Lloyd Huger, MD as Consulting Physician (Oncology)  CHIEF COMPLAINT: Essential thrombocytosis  INTERVAL HISTORY: Patient returns to clinic today for repeat laboratory work and further evaluation.  She continues to feel well and remains asymptomatic, although admits to some mild fatigue having to take care of her husband.  She continues to tolerate Hydrea well without significant side effects. She has no neurologic complaints.  She denies any recent fevers or illnesses.  She has a good appetite and denies weight loss.  She denies any chest pain, shortness of breath, cough, or hemoptysis.  She denies any nausea, vomiting, constipation, or diarrhea.  She has no urinary complaints patient offers no specific complaints today.  REVIEW OF SYSTEMS:   Review of Systems  Constitutional: Positive for malaise/fatigue. Negative for fever and weight loss.  Respiratory: Negative.  Negative for cough, hemoptysis and shortness of breath.   Cardiovascular: Negative.  Negative for chest pain and leg swelling.  Gastrointestinal: Negative.  Negative for abdominal pain.  Genitourinary: Negative.  Negative for dysuria.  Musculoskeletal: Negative.  Negative for back pain.  Skin: Negative.  Negative for rash.  Neurological: Negative.  Negative for focal weakness, weakness and headaches.  Psychiatric/Behavioral: Negative.  The patient is not nervous/anxious.     As per HPI. Otherwise, a complete review of systems is  negative.  PAST MEDICAL HISTORY: Past Medical History:  Diagnosis Date  . Breast mass in female    right breast  . Cataract   . Difficult intubation 03/03/2016   September 06, 2013; left nasal fiberoptic intubation #7 ETT; see letter from Dr. Loanne Drilling, Dept of Anesthesiology, [facility]  . Diverticulitis   . Diverticulosis 05/28/2015  . Essential thrombocytosis ([place]) 07/23/2015  . Genital prolapse   . GERD (gastroesophageal reflux disease)   . 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 Mike Gip  . Hyperlipemia   . Hyperlipidemia    history of   . Hypothyroidism   . Mild atherosclerosis of carotid artery 06/25/2015  . Morton's neuroma   . MRI contraindicated due to metal implant   . Prolapse of uterus   . Radiculopathy   . Renal cyst   . Rosacea   . Shingles   . Thrombocytosis ([place])     PAST SURGICAL HISTORY: Past Surgical History:  Procedure Laterality Date  . ADENOIDECTOMY    . APPENDECTOMY    . biopsy, right breast     . CATARACT EXTRACTION W/PHACO Left 03/25/2015   Procedure: CATARACT EXTRACTION PHACO AND INTRAOCULAR LENS PLACEMENT (IOC);  Surgeon: Estill Cotta, MD;  Location: [facility] ORS;  Service: Ophthalmology;  Laterality: Left;  [place] 02:09 AP% 27.3 CDE 59.97 fluid pack lot #[id number]  . CESAREAN SECTION    . COLON SURGERY    . JOINT REPLACEMENT Right    total hip  . THYROID LOBECTOMY    . THYROIDECTOMY     partial  . TONSILLECTOMY    . TOTAL HIP ARTHROPLASTY    . UPPER GI ENDOSCOPY  10/17/2008    FAMILY HISTORY: Family History  Problem Relation Age of Onset  . Stroke Mother   .  Stroke Father   . Dementia Maternal Grandfather     ADVANCED DIRECTIVES (Y/N):  N  HEALTH MAINTENANCE: Social History   Tobacco Use  . Smoking status: Former Research scientist (life sciences)  . Smokeless tobacco: Never Used  Substance Use Topics  . Alcohol use: No    Alcohol/week: 0.0 standard drinks  . Drug use: No     Colonoscopy:  PAP:  Bone density:  Lipid panel:  Allergies  Allergen  Reactions  . Novocain [Procaine] Palpitations    Current Outpatient Medications  Medication Sig Dispense Refill  . amLODipine (NORVASC) 5 MG tablet Take 1 tablet by mouth once daily 90 tablet 0  . aspirin EC 81 MG tablet Take 81 mg by mouth daily.     Marland Kitchen atorvastatin (LIPITOR) 20 MG tablet TAKE 1 TABLET BY MOUTH AT BEDTIME 90 tablet 0  . Cholecalciferol (VITAMIN D3) 1000 units CAPS Take by mouth daily.     . famotidine (PEPCID) 20 MG tablet Take 1 tablet (20 mg total) by mouth 2 (two) times daily. 60 tablet 2  . hydroxyurea (HYDREA) 500 MG capsule Take 1 capsule (500 mg total) by mouth daily. Take 1 capsule on Monday, Wednesday, Friday and Saturday. 30 capsule 3  . irbesartan (AVAPRO) 150 MG tablet Take 1 tablet (150 mg total) by mouth daily. 90 tablet 5  . ketoconazole (NIZORAL) 2 % shampoo Apply 1 application topically 2 (two) times a week.     . levothyroxine (SYNTHROID, LEVOTHROID) 88 MCG tablet TAKE 1 TABLET BY MOUTH ONCE DAILY BEFORE BREAKFAST 90 tablet 3  . polyethylene glycol powder (GLYCOLAX/MIRALAX) powder Take 1 Container by mouth once.     No current facility-administered medications for this visit.     OBJECTIVE: Vitals:   09/06/18 1338  BP: (!) 143/66  Pulse: 85  Temp: (!) 97.4 F (36.3 C)     Body mass index is 24.55 kg/m.    ECOG FS:0 - Asymptomatic  General: Well-developed, well-nourished, no acute distress. Eyes: Pink conjunctiva, anicteric sclera. HEENT: Normocephalic, moist mucous membranes. Lungs: Clear to auscultation bilaterally. Heart: Regular rate and rhythm. No rubs, murmurs, or gallops. Abdomen: Soft, nontender, nondistended. No organomegaly noted, normoactive bowel sounds. Musculoskeletal: No edema, cyanosis, or clubbing. Neuro: Alert, answering all questions appropriately. Cranial nerves grossly intact. Skin: No rashes or petechiae noted. Psych: Normal affect.   LAB RESULTS:  Lab Results  Component Value Date   NA 139 02/28/2018   K 4.3  02/28/2018   CL 108 02/28/2018   CO2 23 02/28/2018   GLUCOSE 169 (H) 02/28/2018   BUN 40 (H) 02/28/2018   CREATININE 1.46 (H) 02/28/2018   CALCIUM 9.3 02/28/2018   PROT 7.2 02/28/2018   ALBUMIN 4.3 02/28/2018   AST 21 02/28/2018   ALT 15 02/28/2018   ALKPHOS 74 02/28/2018   BILITOT 1.2 02/28/2018   GFRNONAA 32 (L) 02/28/2018   GFRAA 37 (L) 02/28/2018    Lab Results  Component Value Date   WBC 9.4 09/06/2018   NEUTROABS 6.8 09/06/2018   HGB 13.3 09/06/2018   HCT 40.3 09/06/2018   MCV 92.0 09/06/2018   PLT 475 (H) 09/06/2018     STUDIES: No results found.  ASSESSMENT: Essential thrombocytosis  PLAN:   1. Essential thrombocytosis: Patient's platelet count has trended up slightly since decreasing her Hydrea dose to 3 days a week on Monday, Wednesday, and Friday.  We will not increase dose back up to 4 days a week at this point, but if her platelet count continues to  trend up we will consider it at a later date.  She has been instructed to continue 81 mg of aspirin daily.  No intervention is needed.  Return to clinic in 3 months for laboratory work only and then in 6 months for laboratory work and further evaluation. 2.  Renal insufficiency: Mild, monitor.  I spent a total of 20 minutes face-to-face with the patient of which greater than 50% of the visit was spent in counseling and coordination of care as detailed above.   Patient expressed understanding and was in agreement with this plan. She also understands that She can call clinic at any time with any questions, concerns, or complaints.    Lloyd Huger, MD   09/07/2018 3:21 PM

## 2018-09-06 ENCOUNTER — Encounter: Payer: Self-pay | Admitting: Oncology

## 2018-09-06 ENCOUNTER — Other Ambulatory Visit: Payer: Self-pay

## 2018-09-06 ENCOUNTER — Inpatient Hospital Stay: Payer: Medicare HMO | Attending: Oncology

## 2018-09-06 ENCOUNTER — Inpatient Hospital Stay (HOSPITAL_BASED_OUTPATIENT_CLINIC_OR_DEPARTMENT_OTHER): Payer: Medicare HMO | Admitting: Oncology

## 2018-09-06 VITALS — BP 143/66 | HR 85 | Temp 97.4°F | Ht 64.0 in | Wt 143.0 lb

## 2018-09-06 DIAGNOSIS — N289 Disorder of kidney and ureter, unspecified: Secondary | ICD-10-CM | POA: Diagnosis not present

## 2018-09-06 DIAGNOSIS — D473 Essential (hemorrhagic) thrombocythemia: Secondary | ICD-10-CM | POA: Diagnosis not present

## 2018-09-06 DIAGNOSIS — R5383 Other fatigue: Secondary | ICD-10-CM

## 2018-09-06 DIAGNOSIS — Z87891 Personal history of nicotine dependence: Secondary | ICD-10-CM

## 2018-09-06 LAB — CBC WITH DIFFERENTIAL/PLATELET
Abs Immature Granulocytes: 0.05 10*3/uL (ref 0.00–0.07)
Basophils Absolute: 0 10*3/uL (ref 0.0–0.1)
Basophils Relative: 0 %
Eosinophils Absolute: 0.2 10*3/uL (ref 0.0–0.5)
Eosinophils Relative: 2 %
HCT: 40.3 % (ref 36.0–46.0)
Hemoglobin: 13.3 g/dL (ref 12.0–15.0)
Immature Granulocytes: 1 %
Lymphocytes Relative: 16 %
Lymphs Abs: 1.5 10*3/uL (ref 0.7–4.0)
MCH: 30.4 pg (ref 26.0–34.0)
MCHC: 33 g/dL (ref 30.0–36.0)
MCV: 92 fL (ref 80.0–100.0)
Monocytes Absolute: 0.8 10*3/uL (ref 0.1–1.0)
Monocytes Relative: 8 %
Neutro Abs: 6.8 10*3/uL (ref 1.7–7.7)
Neutrophils Relative %: 73 %
Platelets: 475 10*3/uL — ABNORMAL HIGH (ref 150–400)
RBC: 4.38 MIL/uL (ref 3.87–5.11)
RDW: 13.8 % (ref 11.5–15.5)
WBC: 9.4 10*3/uL (ref 4.0–10.5)
nRBC: 0 % (ref 0.0–0.2)

## 2018-09-06 NOTE — Progress Notes (Signed)
Patient stated that she had been doing well with no complaints. 

## 2018-09-08 ENCOUNTER — Ambulatory Visit: Payer: Medicare HMO | Admitting: Family Medicine

## 2018-09-08 ENCOUNTER — Ambulatory Visit: Payer: Medicare HMO

## 2018-09-12 ENCOUNTER — Other Ambulatory Visit: Payer: Self-pay

## 2018-09-12 ENCOUNTER — Ambulatory Visit (INDEPENDENT_AMBULATORY_CARE_PROVIDER_SITE_OTHER): Payer: Medicare HMO | Admitting: Nurse Practitioner

## 2018-09-12 ENCOUNTER — Encounter: Payer: Self-pay | Admitting: Nurse Practitioner

## 2018-09-12 VITALS — BP 118/66 | HR 88 | Temp 96.9°F | Resp 14 | Ht 64.0 in | Wt 142.9 lb

## 2018-09-12 DIAGNOSIS — D473 Essential (hemorrhagic) thrombocythemia: Secondary | ICD-10-CM

## 2018-09-12 DIAGNOSIS — K219 Gastro-esophageal reflux disease without esophagitis: Secondary | ICD-10-CM

## 2018-09-12 DIAGNOSIS — E039 Hypothyroidism, unspecified: Secondary | ICD-10-CM | POA: Diagnosis not present

## 2018-09-12 DIAGNOSIS — N183 Chronic kidney disease, stage 3 unspecified: Secondary | ICD-10-CM

## 2018-09-12 DIAGNOSIS — I10 Essential (primary) hypertension: Secondary | ICD-10-CM

## 2018-09-12 DIAGNOSIS — Z5181 Encounter for therapeutic drug level monitoring: Secondary | ICD-10-CM | POA: Diagnosis not present

## 2018-09-12 DIAGNOSIS — K5901 Slow transit constipation: Secondary | ICD-10-CM

## 2018-09-12 DIAGNOSIS — E785 Hyperlipidemia, unspecified: Secondary | ICD-10-CM | POA: Diagnosis not present

## 2018-09-12 MED ORDER — IRBESARTAN 150 MG PO TABS: 150.0000 mg | ORAL_TABLET | Freq: Every day | ORAL | 3 refills | Status: DC

## 2018-09-12 MED ORDER — AMLODIPINE BESYLATE 5 MG PO TABS: 5.0000 mg | ORAL_TABLET | Freq: Every day | ORAL | 3 refills | Status: DC

## 2018-09-12 MED ORDER — LEVOTHYROXINE SODIUM 88 MCG PO TABS: ORAL_TABLET | ORAL | 3 refills | Status: DC

## 2018-09-12 NOTE — Patient Instructions (Signed)
Always:  - Increase water intake - Increase activity as tolerated.  _______________ Can Do always or sometimes:   - Metamucil Powder; 1 teaspon start once a day for 3 days then twice a day for 3 days then can go up to 3 times a day if needed.  - Take docusate sodium 100 mg twice a day  ________  Rarely just as needed; Please follow direction on box:   - Take Polyethylene glycol 17 grams per day  OR  - Sennosides 8.6 mg: 2 tablets orally once a day at bedtime   _______ If unable to have Bowel movement, having abdominal pain, or nausea and vomiting please get medical assistance immediately.

## 2018-09-12 NOTE — Progress Notes (Signed)
Name: Gina [name]   MRN: [id number]    DOB: [birth year]   Date:09/12/2018       Progress Note  Subjective  Chief Complaint  Chief Complaint  Patient presents with  . Follow-up    HPI  Hypertension Patient is on amlodipine 5mg  daily and irbesartan 150mg  daily .  Takes medications as prescribed with no missed doses a month.  She is compliant with low-salt diet.  She occasionally checks blood pressures at home with range of 110-130/60's Denies chest pain, headaches, blurry vision. opthalmology appointment next month.  BP Readings from Last 3 Encounters:  09/12/18 118/66  09/06/18 (!) 143/66  08/10/18 118/80   Hyperlipidemia Patient rx atorvastatin 20mg  nightly. Takes medications as prescribed with no missed doses a month.  Diet: eats vegetables daily, no fried foods, seldom red meats.  Denies myalgias Lab Results  Component Value Date   CHOL 158 01/07/2018   HDL 53 01/07/2018   LDLCALC 80 01/07/2018   TRIG 151 (H) 01/07/2018   CHOLHDL 3.0 01/07/2018    Hypothyroidism Patient rx levothryoxine 88 mcg daily. Has been on this dose for many  Years. Patient denies fatigue/palpitations, insomnia, diarrhea, hot/cold intolerances,  Takes miralax PRN  Lab Results  Component Value Date   TSH 1.55 01/07/2018   Thrombocytopenia Follows up with Dr. Grayland Ormond; take hydroxyurea.  Cervical radiculopathy  Takes tylenol PRN   PHQ2/9: Depression screen [facility] 2/9 09/12/2018 04/21/2018 01/07/2018 12/24/2017 04/20/2017  Decreased Interest 0 0 0 0 0  Down, Depressed, Hopeless 0 0 0 0 3  PHQ - 2 Score 0 0 0 0 3  Altered sleeping 0 0 0 0 1  Tired, decreased energy 0 0 0 0 0  Change in appetite 0 0 0 0 0  Feeling bad or failure about yourself  0 0 0 0 0  Trouble concentrating 0 0 0 0 0  Moving slowly or fidgety/restless 0 0 0 0 0  Suicidal thoughts 0 0 0 0 0  PHQ-9 Score 0 0 0 0 4  Difficult doing work/chores Not difficult at all Not difficult at all Not difficult at all Not difficult at  all Not difficult at all    PHQ reviewed. Negative  Patient Active Problem List   Diagnosis Date Noted  . DNR (do not resuscitate) 04/22/2018  . Venous stasis 04/21/2018  . Cystocele with prolapse 04/01/2017  . Preventative health care 01/12/2017  . Hemorrhoids 07/29/2016  . Stress due to illness of family member 07/29/2016  . Gilbert syndrome 06/30/2016  . Difficult intubation 03/03/2016  . Need for SBE (subacute bacterial endocarditis) prophylaxis 10/16/2015  . Essential thrombocytosis ([place]) 07/23/2015  . Mild atherosclerosis of carotid artery 06/25/2015  . Dizziness 06/03/2015  . Dyslipidemia 06/03/2015  . Medication monitoring encounter 05/28/2015  . Thrombocytosis ([place]) 05/28/2015  . Diverticulosis 05/28/2015  . GERD without esophagitis 02/06/2015  . Leg cramps 01/31/2015  . Neuropathy 01/31/2015  . Cervical radiculopathy 01/31/2015  . CKD (chronic kidney disease), stage III ([place]) 10/06/2014  . Renal cyst, acquired, right 10/06/2014  . Uterine prolapse 10/06/2014  . Cervical radiculopathy due to degenerative joint disease of spine 10/06/2014  . History of right hip replacement 10/06/2014  . Pneumococcal vaccination declined by patient 10/06/2014  . Hypothyroidism, adult 10/05/2014  . Essential hypertension 09/13/2014    Past Medical History:  Diagnosis Date  . Breast mass in female    right breast  . Cataract   . Difficult intubation 03/03/2016   September 06, 2013; left nasal fiberoptic  intubation #7 ETT; see letter from Dr. Loanne Drilling, Dept of Anesthesiology, [facility]  . Diverticulitis   . Diverticulosis 05/28/2015  . Essential thrombocytosis ([place]) 07/23/2015  . Genital prolapse   . GERD (gastroesophageal reflux disease)   . 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 Mike Gip  . Hyperlipemia   . Hyperlipidemia    history of   . Hypothyroidism   . Mild atherosclerosis of carotid artery 06/25/2015  . Morton's neuroma   . MRI contraindicated due to metal implant   .  Prolapse of uterus   . Radiculopathy   . Renal cyst   . Rosacea   . Shingles   . Thrombocytosis ([place])     Past Surgical History:  Procedure Laterality Date  . ADENOIDECTOMY    . APPENDECTOMY    . biopsy, right breast     . CATARACT EXTRACTION W/PHACO Left 03/25/2015   Procedure: CATARACT EXTRACTION PHACO AND INTRAOCULAR LENS PLACEMENT (IOC);  Surgeon: Estill Cotta, MD;  Location: [facility] ORS;  Service: Ophthalmology;  Laterality: Left;  [place] 02:09 AP% 27.3 CDE 59.97 fluid pack lot #[id number]  . CESAREAN SECTION    . COLON SURGERY    . JOINT REPLACEMENT Right    total hip  . THYROID LOBECTOMY    . THYROIDECTOMY     partial  . TONSILLECTOMY    . TOTAL HIP ARTHROPLASTY    . UPPER GI ENDOSCOPY  10/17/2008    Social History   Tobacco Use  . Smoking status: Former Research scientist (life sciences)  . Smokeless tobacco: Never Used  Substance Use Topics  . Alcohol use: No    Alcohol/week: 0.0 standard drinks     Current Outpatient Medications:  .  amLODipine (NORVASC) 5 MG tablet, Take 1 tablet by mouth once daily, Disp: 90 tablet, Rfl: 0 .  aspirin EC 81 MG tablet, Take 81 mg by mouth daily. , Disp: , Rfl:  .  atorvastatin (LIPITOR) 20 MG tablet, TAKE 1 TABLET BY MOUTH AT BEDTIME, Disp: 90 tablet, Rfl: 0 .  Cholecalciferol (VITAMIN D3) 1000 units CAPS, Take by mouth daily. , Disp: , Rfl:  .  famotidine (PEPCID) 20 MG tablet, Take 1 tablet (20 mg total) by mouth 2 (two) times daily. (Patient taking differently: Take 20 mg by mouth as needed. ), Disp: 60 tablet, Rfl: 2 .  hydroxyurea (HYDREA) 500 MG capsule, Take 1 capsule (500 mg total) by mouth daily. Take 1 capsule on Monday, Wednesday, Friday and Saturday., Disp: 30 capsule, Rfl: 3 .  ketoconazole (NIZORAL) 2 % shampoo, Apply 1 application topically 2 (two) times a week. , Disp: , Rfl:  .  levothyroxine (SYNTHROID, LEVOTHROID) 88 MCG tablet, TAKE 1 TABLET BY MOUTH ONCE DAILY BEFORE BREAKFAST, Disp: 90 tablet, Rfl: 3 .  irbesartan (AVAPRO) 150 MG  tablet, Take 1 tablet (150 mg total) by mouth daily. (Patient not taking: Reported on 09/12/2018), Disp: 90 tablet, Rfl: 5 .  polyethylene glycol powder (GLYCOLAX/MIRALAX) powder, Take 1 Container by mouth once., Disp: , Rfl:   Allergies  Allergen Reactions  . Novocain [Procaine] Palpitations    Review of Systems  Constitutional: Negative for chills, fever and malaise/fatigue.  HENT: Negative for congestion, sinus pain and sore throat.   Eyes: Negative for blurred vision.  Respiratory: Negative for cough and shortness of breath.   Cardiovascular: Negative for chest pain, palpitations and leg swelling.  Gastrointestinal: Positive for constipation. Negative for abdominal pain, blood in stool, diarrhea and nausea.  Genitourinary: Negative for dysuria and hematuria.  Musculoskeletal: Negative for falls and joint pain.  Skin: Negative for rash.  Neurological: Negative for dizziness, weakness and headaches.  Endo/Heme/Allergies: Negative for polydipsia.  Psychiatric/Behavioral: The patient is not nervous/anxious and does not have insomnia.       No other specific complaints in a complete review of systems (except as listed in HPI above).  Objective  Vitals:   09/12/18 1012  BP: 118/66  Pulse: 88  Resp: 14  Temp: (!) 96.9 F (36.1 C)  TempSrc: Temporal  SpO2: 98%  Weight: 142 lb 14.4 oz (64.8 kg)  Height: 5\' 4"  (1.626 m)     Body mass index is 24.53 kg/m.  Nursing Note and Vital Signs reviewed.  Physical Exam     No results found for this or any previous visit (from the past 48 hour(s)).  Assessment & Plan 1. Essential hypertension stable - amLODipine (NORVASC) 5 MG tablet; Take 1 tablet (5 mg total) by mouth daily.  Dispense: 90 tablet; Refill: 3 - irbesartan (AVAPRO) 150 MG tablet; Take 1 tablet (150 mg total) by mouth daily.  Dispense: 90 tablet; Refill: 3  2. GERD without esophagitis Prn famotidine   3. Hypothyroidism, adult Stable, refill meds if  needed - TSH - levothyroxine (SYNTHROID) 88 MCG tablet; TAKE 1 TABLET BY MOUTH ONCE DAILY BEFORE BREAKFAST  Dispense: 90 tablet; Refill: 3  4. CKD (chronic kidney disease), stage III (HCC) Hydration, avoid nsaids, follows up with nephrology  - COMPLETE METABOLIC PANEL WITH GFR  5. Essential thrombocytosis (HCC) Stable, continue meds follows up with heme/onc  6. Dyslipidemia Monitor  - Lipid Profile  7. Medication monitoring encounter - COMPLETE METABOLIC PANEL WITH GFR  9. Slow transit constipation - Metamucil Powder; 1 teaspon start once a day for 3 days then twice a day for 3 days then can go up to 3 times a day if needed.  - Take docusate sodium 100 mg twice a day

## 2018-09-13 ENCOUNTER — Telehealth: Payer: Self-pay | Admitting: Nurse Practitioner

## 2018-09-13 LAB — COMPLETE METABOLIC PANEL WITH GFR
AG Ratio: 1.4 (calc) (ref 1.0–2.5)
ALT: 11 U/L (ref 6–29)
AST: 19 U/L (ref 10–35)
Albumin: 4.4 g/dL (ref 3.6–5.1)
Alkaline phosphatase (APISO): 90 U/L (ref 37–153)
BUN/Creatinine Ratio: 24 (calc) — ABNORMAL HIGH (ref 6–22)
BUN: 28 mg/dL — ABNORMAL HIGH (ref 7–25)
CO2: 17 mmol/L — ABNORMAL LOW (ref 20–32)
Calcium: 10.1 mg/dL (ref 8.6–10.4)
Chloride: 105 mmol/L (ref 98–110)
Creat: 1.18 mg/dL — ABNORMAL HIGH (ref 0.60–0.88)
GFR, Est African American: 47 mL/min/{1.73_m2} — ABNORMAL LOW (ref >=60)
GFR, Est Non African American: 41 mL/min/{1.73_m2} — ABNORMAL LOW (ref >=60)
Globulin: 3.2 g/dL (calc) (ref 1.9–3.7)
Glucose, Bld: 108 mg/dL — ABNORMAL HIGH (ref 65–99)
Potassium: 5.4 mmol/L — ABNORMAL HIGH (ref 3.5–5.3)
Sodium: 140 mmol/L (ref 135–146)
Total Bilirubin: 1.2 mg/dL (ref 0.2–1.2)
Total Protein: 7.6 g/dL (ref 6.1–8.1)

## 2018-09-13 LAB — LIPID PANEL
Cholesterol: 171 mg/dL (ref <200)
HDL: 51 mg/dL (ref >=50)
LDL Cholesterol (Calc): 94 mg/dL (calc)
Non-HDL Cholesterol (Calc): 120 mg/dL (calc) (ref <130)
Total CHOL/HDL Ratio: 3.4 (calc) (ref <5.0)
Triglycerides: 162 mg/dL — ABNORMAL HIGH (ref <150)

## 2018-09-13 LAB — TSH: TSH: 1.24 mIU/L (ref 0.40–4.50)

## 2018-09-13 NOTE — Telephone Encounter (Signed)
-----   Message from Sibyl Parr, [place] sent at 09/13/2018  3:38 PM EDT ----- Pt notified and wanted to let you know she has had some odor with urine but no other symptoms. Please advise.

## 2018-09-13 NOTE — Telephone Encounter (Signed)
Encourage her to drink at least 6 glasses of water daily, avoid caffeine for one week. If she develops dsyruia, lower back pain, fatigue, weakness please follow-up with appt

## 2018-09-14 NOTE — Telephone Encounter (Signed)
Pt.notified

## 2018-09-15 DIAGNOSIS — R69 Illness, unspecified: Secondary | ICD-10-CM | POA: Diagnosis not present

## 2018-09-22 DIAGNOSIS — Z961 Presence of intraocular lens: Secondary | ICD-10-CM | POA: Diagnosis not present

## 2018-10-14 ENCOUNTER — Emergency Department
Admission: EM | Admit: 2018-10-14 | Discharge: 2018-10-14 | Disposition: A | Discharge status: Home / Self Care | Payer: Medicare HMO | Attending: Student | Admitting: Student

## 2018-10-14 ENCOUNTER — Other Ambulatory Visit: Payer: Self-pay

## 2018-10-14 ENCOUNTER — Emergency Department: Payer: Medicare HMO

## 2018-10-14 ENCOUNTER — Encounter: Payer: Self-pay | Admitting: Emergency Medicine

## 2018-10-14 DIAGNOSIS — E039 Hypothyroidism, unspecified: Secondary | ICD-10-CM | POA: Diagnosis not present

## 2018-10-14 DIAGNOSIS — N183 Chronic kidney disease, stage 3 (moderate): Secondary | ICD-10-CM | POA: Insufficient documentation

## 2018-10-14 DIAGNOSIS — Z96641 Presence of right artificial hip joint: Secondary | ICD-10-CM | POA: Insufficient documentation

## 2018-10-14 DIAGNOSIS — I129 Hypertensive chronic kidney disease with stage 1 through stage 4 chronic kidney disease, or unspecified chronic kidney disease: Secondary | ICD-10-CM | POA: Diagnosis not present

## 2018-10-14 DIAGNOSIS — Z7982 Long term (current) use of aspirin: Secondary | ICD-10-CM | POA: Diagnosis not present

## 2018-10-14 DIAGNOSIS — R0602 Shortness of breath: Secondary | ICD-10-CM | POA: Diagnosis not present

## 2018-10-14 DIAGNOSIS — Z79899 Other long term (current) drug therapy: Secondary | ICD-10-CM | POA: Diagnosis not present

## 2018-10-14 DIAGNOSIS — Z87891 Personal history of nicotine dependence: Secondary | ICD-10-CM | POA: Insufficient documentation

## 2018-10-14 LAB — BASIC METABOLIC PANEL
Anion gap: 7 (ref 5–15)
BUN: 21 mg/dL (ref 8–23)
CO2: 25 mmol/L (ref 22–32)
Calcium: 9.8 mg/dL (ref 8.9–10.3)
Chloride: 99 mmol/L (ref 98–111)
Creatinine, Ser: 1.09 mg/dL — ABNORMAL HIGH (ref 0.44–1.00)
GFR calc Af Amer: 52 mL/min — ABNORMAL LOW (ref >60)
GFR calc non Af Amer: 45 mL/min — ABNORMAL LOW (ref >60)
Glucose, Bld: 126 mg/dL — ABNORMAL HIGH (ref 70–99)
Potassium: 4.1 mmol/L (ref 3.5–5.1)
Sodium: 131 mmol/L — ABNORMAL LOW (ref 135–145)

## 2018-10-14 LAB — CBC
HCT: 43.2 % (ref 36.0–46.0)
Hemoglobin: 14.4 g/dL (ref 12.0–15.0)
MCH: 30.7 pg (ref 26.0–34.0)
MCHC: 33.3 g/dL (ref 30.0–36.0)
MCV: 92.1 fL (ref 80.0–100.0)
Platelets: 460 10*3/uL — ABNORMAL HIGH (ref 150–400)
RBC: 4.69 MIL/uL (ref 3.87–5.11)
RDW: 13.8 % (ref 11.5–15.5)
WBC: 7.7 10*3/uL (ref 4.0–10.5)
nRBC: 0 % (ref 0.0–0.2)

## 2018-10-14 LAB — TROPONIN I (HIGH SENSITIVITY)
Troponin I (High Sensitivity): 4 ng/L (ref <18)
Troponin I (High Sensitivity): 6 ng/L (ref <18)

## 2018-10-14 LAB — BRAIN NATRIURETIC PEPTIDE: B Natriuretic Peptide: 78 pg/mL (ref 0.0–100.0)

## 2018-10-14 NOTE — ED Triage Notes (Signed)
Patient reports shortness of breath and headache since yesterday. Denies cough or fever. Denies contact with anyone sick. States its  Hard to take a deep breath. Breathing improves when laying down. Also complaining of pain in bilateral knees

## 2018-10-14 NOTE — ED Notes (Signed)
Pt given water and graham crackers 

## 2018-10-14 NOTE — ED Provider Notes (Signed)
[facility] Emergency Department Provider Note  ____________________________________________   First MD Initiated Contact with Patient 10/14/18 [phone]     (approximate)  I have reviewed the triage vital signs and the nursing notes.  History  Chief Complaint Shortness of Breath, Knee Pain, and Headache    HPI Gina [name] is a 85 y.o. female who presents to the emergency department for shortness of breath.  Patient states she has felt this way since yesterday. Says it feels like she has to take "an extra deep breath to get enough air in."  She denies any associated fevers, cough, chest pain, palpitations.  No sick contacts. Symptoms improve when laying down.   She thinks the majority of her symptoms are likely related to anxiety/worrying.  She states she is the primary caregiver of her 31 year old husband at home.  She is also worried about the general state of things in the world, and this is on her mind frequently.  In the emergency department, she is feeling well.  She states she presented for evaluation just to make sure everything was okay.  No history of DVT, PE, or recent travel.         Past Medical Hx Past Medical History:  Diagnosis Date  . Breast mass in female    right breast  . Cataract   . Difficult intubation 03/03/2016   September 06, 2013; left nasal fiberoptic intubation #7 ETT; see letter from Dr. Loanne Drilling, Dept of Anesthesiology, [facility]  . Diverticulitis   . Diverticulosis 05/28/2015  . Essential thrombocytosis ([place]) 07/23/2015  . Genital prolapse   . GERD (gastroesophageal reflux disease)   . 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 Mike Gip  . Hyperlipemia   . Hyperlipidemia    history of   . Hypothyroidism   . Mild atherosclerosis of carotid artery 06/25/2015  . Morton's neuroma   . MRI contraindicated due to metal implant   . Prolapse of uterus   . Radiculopathy   . Renal cyst   . Rosacea   . Shingles   . Thrombocytosis ([place])      Problem List Patient Active Problem List   Diagnosis Date Noted  . DNR (do not resuscitate) 04/22/2018  . Venous stasis 04/21/2018  . Cystocele with prolapse 04/01/2017  . Hemorrhoids 07/29/2016  . Gilbert syndrome 06/30/2016  . Essential thrombocytosis ([place]) 07/23/2015  . Mild atherosclerosis of carotid artery 06/25/2015  . Dyslipidemia 06/03/2015  . Diverticulosis 05/28/2015  . GERD without esophagitis 02/06/2015  . Neuropathy 01/31/2015  . Cervical radiculopathy 01/31/2015  . CKD (chronic kidney disease), stage III ([place]) 10/06/2014  . Renal cyst, acquired, right 10/06/2014  . Uterine prolapse 10/06/2014  . Cervical radiculopathy due to degenerative joint disease of spine 10/06/2014  . History of right hip replacement 10/06/2014  . Hypothyroidism, adult 10/05/2014  . Essential hypertension 09/13/2014    Past Surgical Hx Past Surgical History:  Procedure Laterality Date  . ADENOIDECTOMY    . APPENDECTOMY    . biopsy, right breast     . CATARACT EXTRACTION W/PHACO Left 03/25/2015   Procedure: CATARACT EXTRACTION PHACO AND INTRAOCULAR LENS PLACEMENT (IOC);  Surgeon: Estill Cotta, MD;  Location: [facility] ORS;  Service: Ophthalmology;  Laterality: Left;  [place] 02:09 AP% 27.3 CDE 59.97 fluid pack lot [id number]  . CESAREAN SECTION    . COLON SURGERY    . JOINT REPLACEMENT Right    total hip  . THYROID LOBECTOMY    . THYROIDECTOMY  partial  . TONSILLECTOMY    . TOTAL HIP ARTHROPLASTY    . UPPER GI ENDOSCOPY  10/17/2008    Medications Prior to Admission medications   Medication Sig Start Date End Date Taking? Authorizing Provider  amLODipine (NORVASC) 5 MG tablet Take 1 tablet (5 mg total) by mouth daily. 09/12/18   Poulose, Bethel Born, NP  aspirin EC 81 MG tablet Take 81 mg by mouth daily.  08/19/15   Arnetha Courser, MD  atorvastatin (LIPITOR) 20 MG tablet TAKE 1 TABLET BY MOUTH AT BEDTIME 07/27/18   Hubbard Hartshorn, FNP  Cholecalciferol (VITAMIN D3) 1000 units CAPS  Take by mouth daily.     [provider]  famotidine (PEPCID) 20 MG tablet Take 1 tablet (20 mg total) by mouth 2 (two) times daily. Patient taking differently: Take 20 mg by mouth as needed.  01/11/18   Arnetha Courser, MD  hydroxyurea (HYDREA) 500 MG capsule Take 1 capsule (500 mg total) by mouth daily. Take 1 capsule on Monday, Wednesday, Friday and Saturday. 02/28/18   Lloyd Huger, MD  irbesartan (AVAPRO) 150 MG tablet Take 1 tablet (150 mg total) by mouth daily. 09/12/18   Poulose, Bethel Born, NP  ketoconazole (NIZORAL) 2 % shampoo Apply 1 application topically 2 (two) times a week.  05/28/15   [provider]  levothyroxine (SYNTHROID) 88 MCG tablet TAKE 1 TABLET BY MOUTH ONCE DAILY BEFORE BREAKFAST 09/12/18   Poulose, Bethel Born, NP  polyethylene glycol powder (GLYCOLAX/MIRALAX) powder Take 1 Container by mouth once.    [provider]    Allergies Novocain [procaine]  Family Hx Family History  Problem Relation Age of Onset  . Stroke Mother   . Stroke Father   . Dementia Maternal Grandfather     Social Hx Social History   Tobacco Use  . Smoking status: Former Research scientist (life sciences)  . Smokeless tobacco: Never Used  Substance Use Topics  . Alcohol use: No    Alcohol/week: 0.0 standard drinks  . Drug use: No     Review of Systems  Constitutional: Negative for fever. Negative for chills. Eyes: Negative for visual changes. ENT: Negative for sore throat. Cardiovascular: Negative for chest pain. Respiratory: + for shortness of breath. Gastrointestinal: Negative for abdominal pain. Negative for nausea. Negative for vomiting. Genitourinary: Negative for dysuria. Musculoskeletal: Negative for leg swelling. Skin: Negative for rash. Neurological: Negative for for headaches.   Physical Exam  Vital Signs: ED Triage Vitals  Enc Vitals Group     BP 10/14/18 0854 (!) 156/69     Pulse Rate 10/14/18 0854 69     Resp 10/14/18 0854 18     Temp 10/14/18 0854  98.1 F (36.7 C)     Temp Source 10/14/18 0854 Oral     SpO2 10/14/18 0854 99 %     Weight 10/14/18 0855 140 lb (63.5 kg)     Height 10/14/18 0855 5\' 4"  (1.626 m)     Head Circumference --      Peak Flow --      Pain Score 10/14/18 0855 0     Pain Loc --      Pain Edu? --      Excl. in [place]? --     Constitutional: Alert and oriented.  Appears younger than stated age. Eyes: Conjunctivae clear. Sclera anicteric. Head: Normocephalic. Atraumatic. Nose: No congestion. No rhinorrhea. Mouth/Throat: Mucous membranes are moist.  Neck: No stridor.   Cardiovascular: Normal rate, regular rhythm. No murmurs. Extremities  well perfused. Respiratory: Normal respiratory effort.  Lungs CTAB. No respiratory distress. Gastrointestinal: Soft and non-tender. No distention.  Musculoskeletal: No lower extremity edema. Neurologic:  Normal speech and language. No gross focal neurologic deficits are appreciated.  Skin: Skin is warm, dry and intact. No rash noted. Psychiatric: Mood and affect are appropriate for situation.  EKG  Personally reviewed.   Rate: 74 Rhythm: sinus Axis: normal Intervals: within normal limits No acute ischemic changes   Radiology  XR:  IMPRESSION: No active cardiopulmonary disease.     Procedures  Procedure(s) performed (including critical care):  Procedures   Initial Impression / Assessment and Plan / ED Course  85 y.o. female who presents to the ED for shortness of breath as described above.  On exam, she is well appearing, vitals stable.  Lungs clear bilaterally, satting 99 to 100% on room air.  No respiratory distress.  No lower extremity swelling or edema.  Differential includes anxiety, bronchitis, atypical ACS.  Less likely pneumonia given lack of cough or infectious symptoms.  Do not suspect PE given no recent travel, no history of same, no hypoxia, tachycardia, or tachypnea. No fevers/body aches or exposures to suggest coronavirus. We will plan for  labs, EKG, imaging and reassess.   Work-up without actionable derangements, creatinine slightly improved from prior.  EKG without acute ischemic changes, high-sensitivity troponin x2 are less than 18 and delta less than 5.  BNP within normal limits.  Chest x-ray clear.  Updated patient on results, who feels comfortable with discharge and PCP follow up. Given return precautions.   Final Clinical Impression(s) / ED Diagnosis  Final diagnoses:  Shortness of breath       Note:  This document was prepared using Dragon voice recognition software and may include unintentional dictation errors.   Lilia Pro., MD 10/14/18 [phone]

## 2018-10-14 NOTE — Discharge Instructions (Signed)
Thank you for letting us take care of you in the emergency department today.  ° °Please continue to take your regular, prescribed medications.  ° °Please follow up with: °- Your primary care doctor to review your ER visit and follow up on your symptoms.  ° °Please return to the ER for any new or worsening symptoms.  ° °

## 2018-10-14 NOTE — ED Notes (Signed)
Patient transported to X-ray 

## 2018-10-29 IMAGING — US US ABDOMEN LIMITED
1 series · 14 of 25 positions shown · non-contrast
Comparison: 12/29/2014 CT

CLINICAL DATA: Elevated bilirubin

EXAM:
US ABDOMEN LIMITED - RIGHT UPPER QUADRANT

[Series 1: us abdomen limited · 0.15mm/px · 14 of 47 slices shown]
[im 1/47]
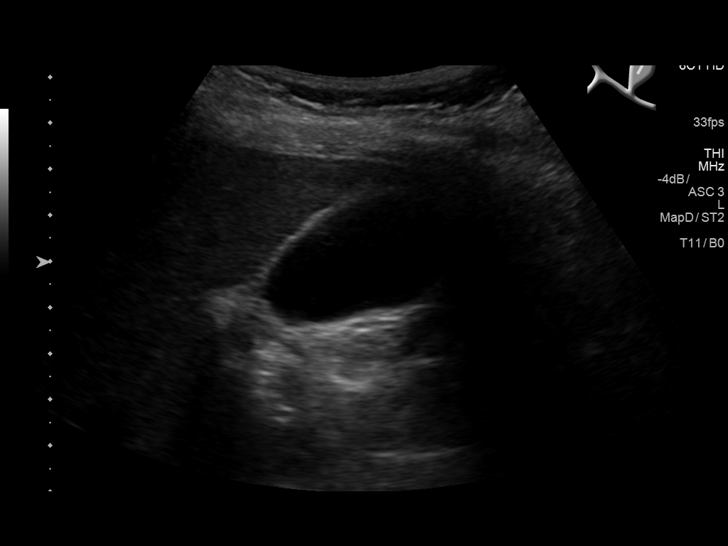
[im 4/47]
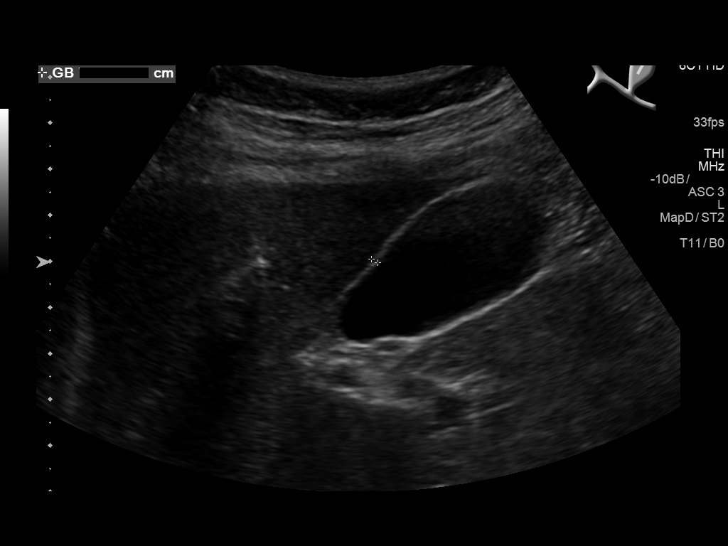
[im 8/47]
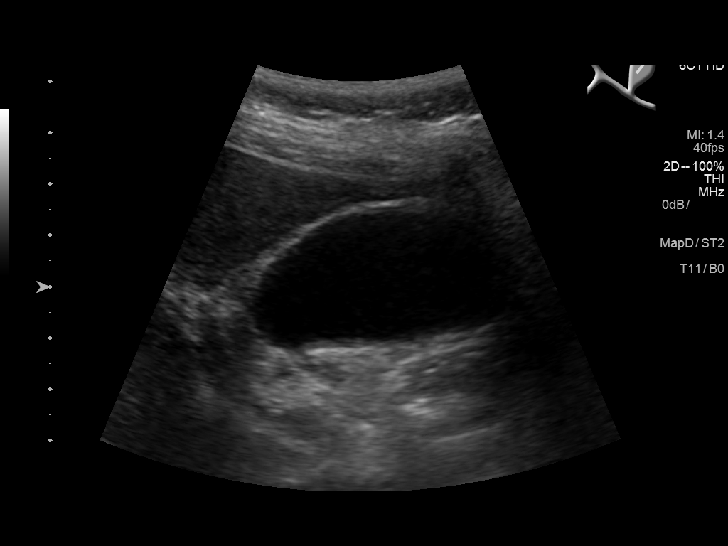
[im 12/47]
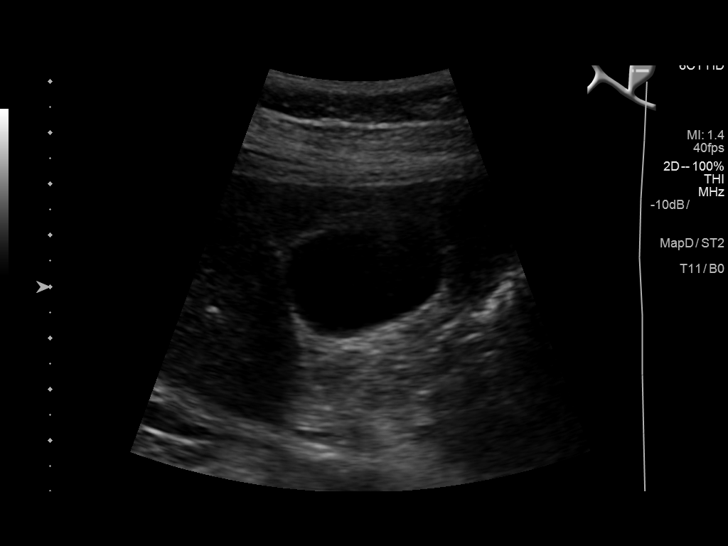
[im 16/47]
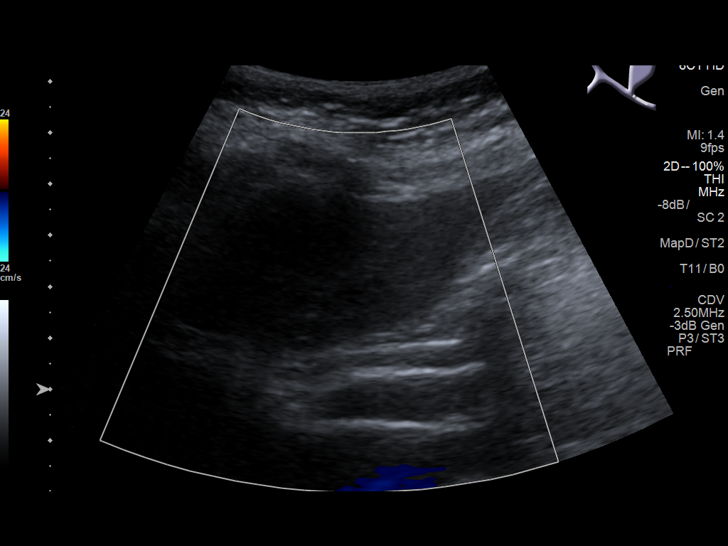
[im 18/47]
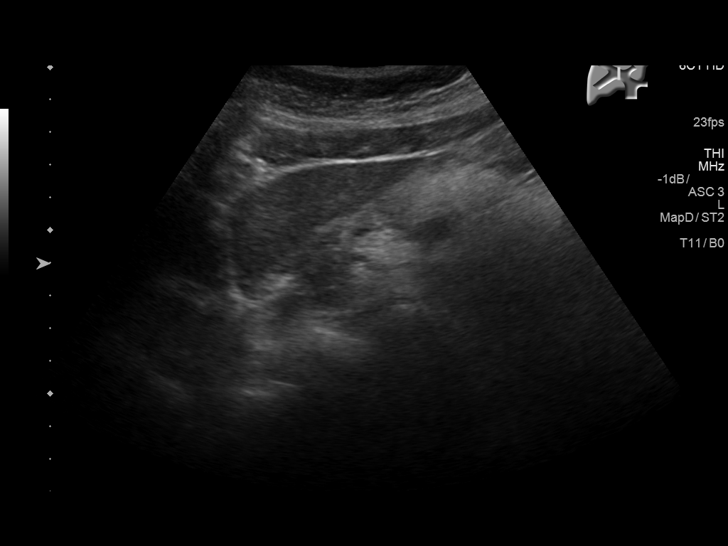
[im 22/47]
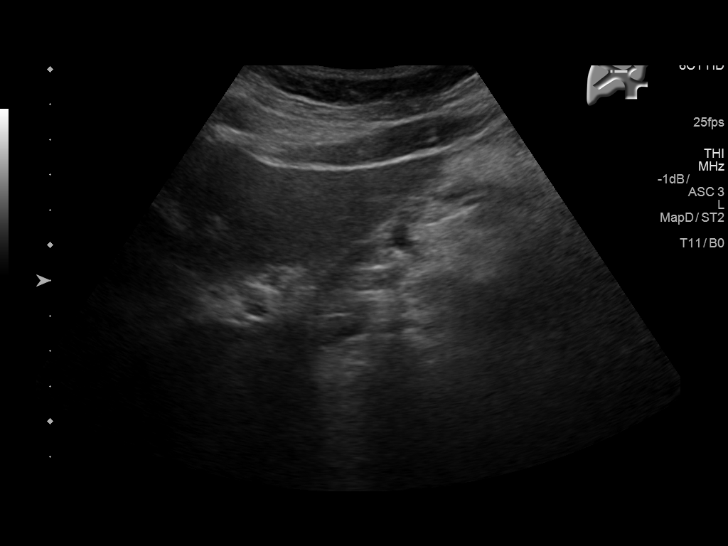
[im 25/47]
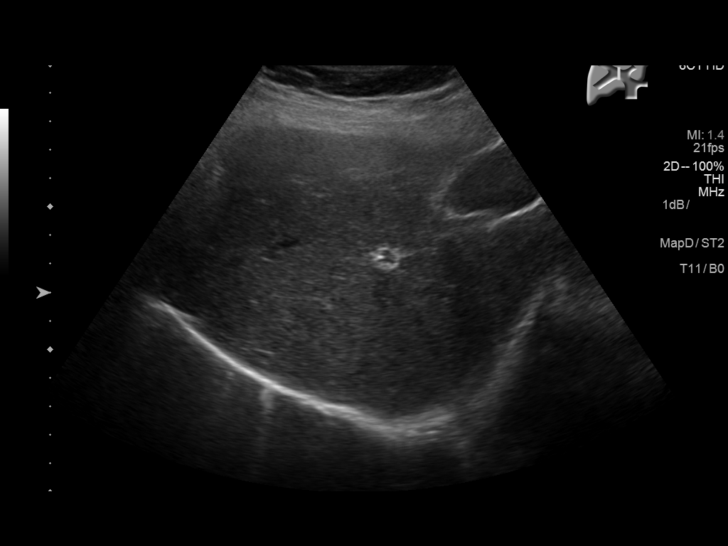
[im 29/47]
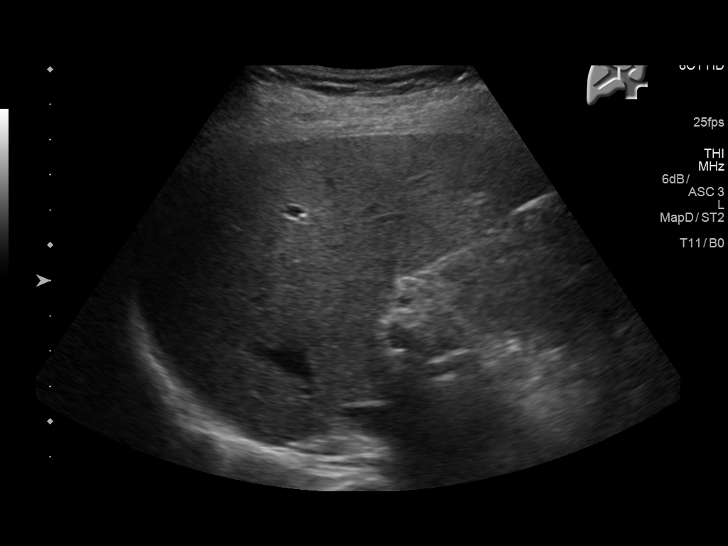
[im 31/47]
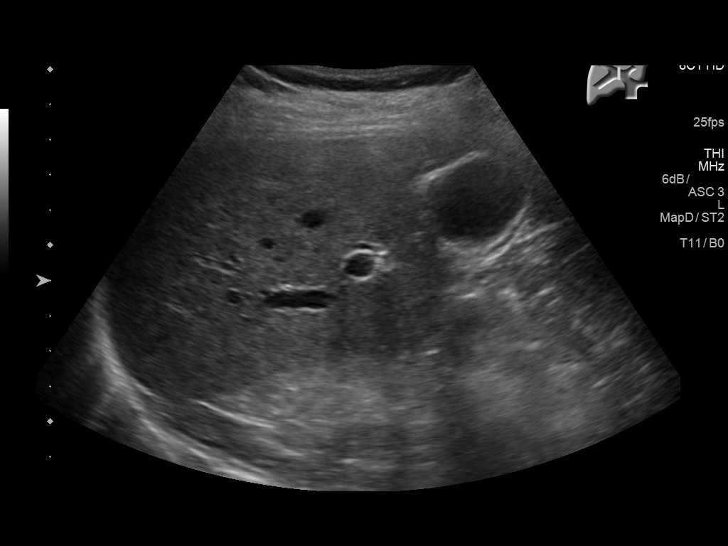
[im 35/47]
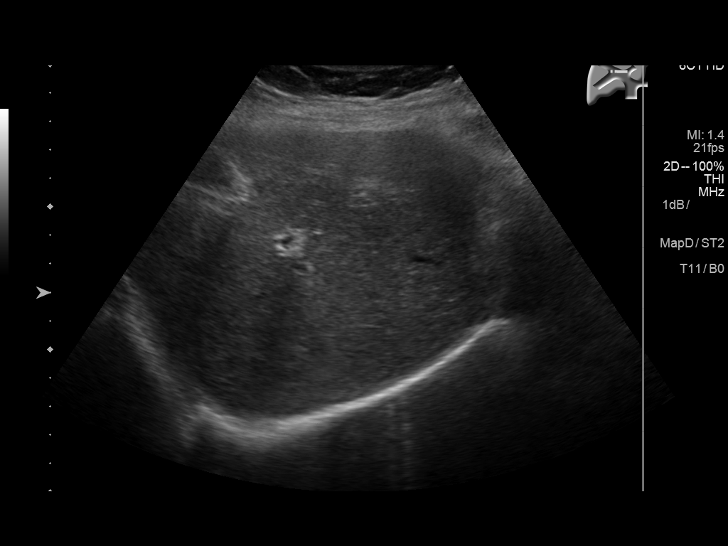
[im 39/47]
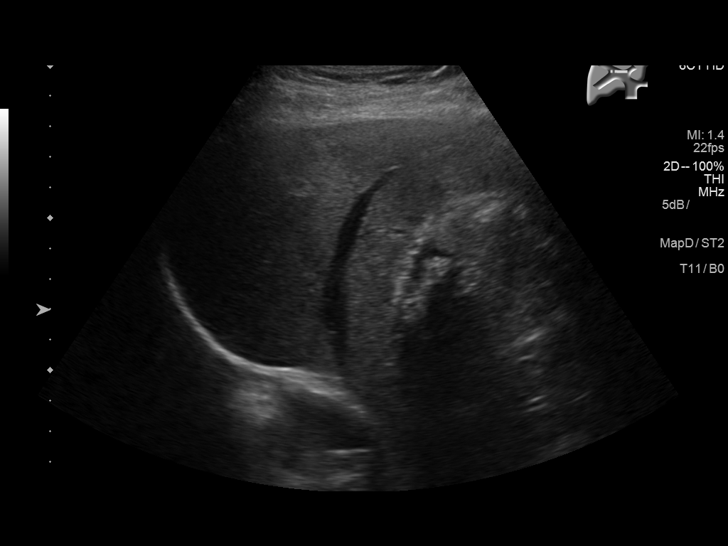
[im 43/47]
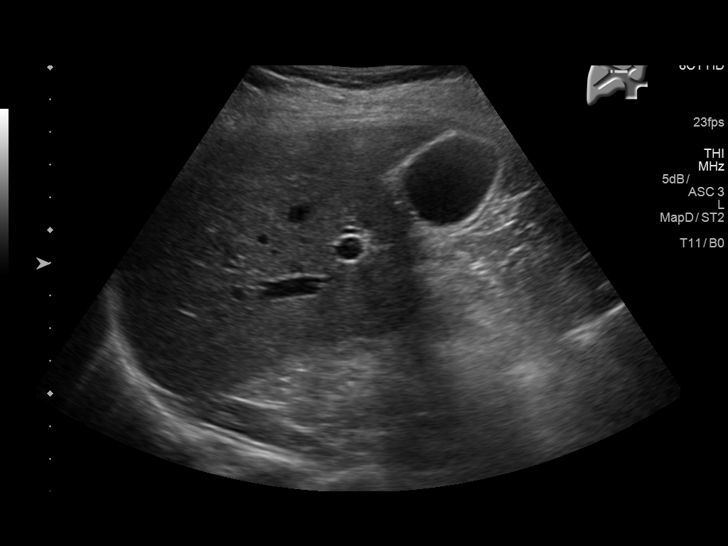
[im 47/47]
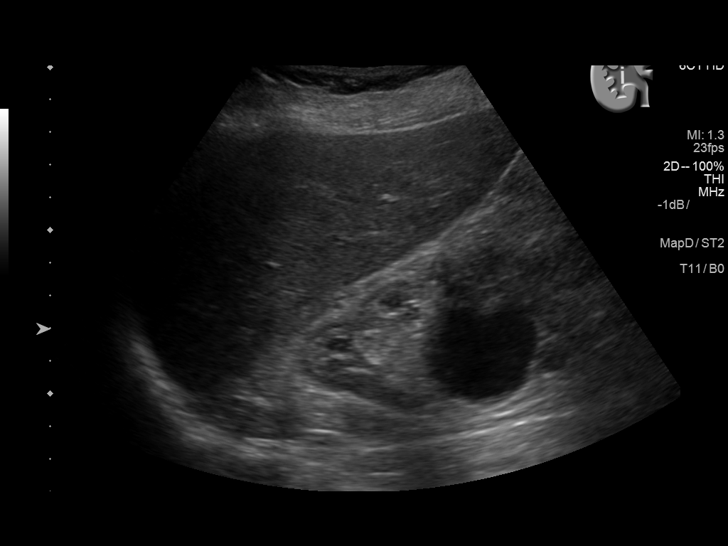

[14 of 25 positions shown; findings below may reference images not displayed]

FINDINGS: Gallbladder:

No gallstones or wall thickening visualized. No sonographic Murphy
sign noted by sonographer.

Common bile duct:

Diameter: Normal caliber, 4 mm

Liver:

No focal lesion identified. Within normal limits in parenchymal
echogenicity.

Large right renal cyst partially imaged as seen on prior CT.
IMPRESSION: No acute findings in the right upper quadrant.

## 2018-10-30 ENCOUNTER — Other Ambulatory Visit: Payer: Self-pay | Admitting: Family Medicine

## 2018-11-10 ENCOUNTER — Other Ambulatory Visit: Payer: Self-pay

## 2018-11-10 ENCOUNTER — Encounter: Payer: Self-pay | Admitting: Obstetrics & Gynecology

## 2018-11-10 ENCOUNTER — Ambulatory Visit (INDEPENDENT_AMBULATORY_CARE_PROVIDER_SITE_OTHER): Payer: Medicare HMO | Admitting: Obstetrics & Gynecology

## 2018-11-10 VITALS — BP 150/90 | Ht 64.0 in | Wt 145.0 lb

## 2018-11-10 DIAGNOSIS — N3001 Acute cystitis with hematuria: Secondary | ICD-10-CM | POA: Diagnosis not present

## 2018-11-10 DIAGNOSIS — N814 Uterovaginal prolapse, unspecified: Secondary | ICD-10-CM | POA: Diagnosis not present

## 2018-11-10 DIAGNOSIS — R8290 Unspecified abnormal findings in urine: Secondary | ICD-10-CM

## 2018-11-10 LAB — POCT URINALYSIS DIPSTICK
Bilirubin, UA: NEGATIVE
Blood, UA: NEGATIVE
Glucose, UA: NEGATIVE
Ketones, UA: NEGATIVE
Nitrite, UA: NEGATIVE
Protein, UA: NEGATIVE
Spec Grav, UA: 1.01 (ref 1.010–1.025)
Urobilinogen, UA: 0.2 E.U./dL
pH, UA: 5 (ref 5.0–8.0)

## 2018-11-10 MED ORDER — CIPROFLOXACIN HCL 500 MG PO TABS: 500.0000 mg | ORAL_TABLET | Freq: Two times a day (BID) | ORAL | 0 refills | Status: DC

## 2018-11-10 NOTE — Progress Notes (Signed)
HPI:      [name] is a 85 y.o. [phone] who presents today for her pessary follow up and examination related to her pelvic floor weakening.  Pt reports tolerating the pessary well with  no vaginal bleeding and  no vaginal discharge.  Symptoms of pelvic floor weakening have greatly improved. She is voiding and defecating without difficulty. She currently has a Ring #3 pessary. She does report some recent urine odor.  PMHx: She  has a past medical history of Breast mass in female, Cataract, Difficult intubation (03/03/2016), Diverticulitis, Diverticulosis (05/28/2015), Essential thrombocytosis ([place]) (07/23/2015), Genital prolapse, GERD (gastroesophageal reflux disease), Rosanna Randy syndrome (06/30/2016), Hyperlipemia, Hyperlipidemia, Hypothyroidism, Mild atherosclerosis of carotid artery (06/25/2015), Morton's neuroma, MRI contraindicated due to metal implant, Prolapse of uterus, Radiculopathy, Renal cyst, Rosacea, Shingles, and Thrombocytosis ([place]). Also,  has a past surgical history that includes Tonsillectomy; Appendectomy; Cesarean section; Joint replacement (Right); Thyroidectomy; Colon surgery; Cataract extraction w/PHACO (Left, 03/25/2015); Adenoidectomy; biopsy, right breast ; Total hip arthroplasty; Thyroid lobectomy; and Upper gi endoscopy (10/17/2008)., family history includes Dementia in her maternal grandfather; Stroke in her father and mother.,  reports that she has quit smoking. She has never used smokeless tobacco. She reports that she does not drink alcohol or use drugs.  She has a current medication list which includes the following prescription(s): amlodipine, aspirin ec, atorvastatin, vitamin d3, famotidine, hydroxyurea, irbesartan, ketoconazole, levothyroxine, polyethylene glycol powder, and ciprofloxacin. Also, is allergic to novocain [procaine].  Review of Systems  All other systems reviewed and are negative.   Objective: BP (!) 150/90   Ht 5\' 4"  (1.626 m)   Wt 145 lb (65.8 kg)    BMI 24.89 kg/m  Physical Exam Constitutional:      General: She is not in acute distress.    Appearance: She is well-developed.  Genitourinary:     Pelvic exam was performed with patient supine.     Vagina and uterus normal.     No vaginal erythema or bleeding.     No cervical motion tenderness, discharge, polyp or nabothian cyst.     Uterus is mobile.     Uterus is not enlarged.     No uterine mass detected.    Uterus is midaxial.     No right or left adnexal mass present.     Right adnexa not tender.     Left adnexa not tender.     Genitourinary Comments: Gr 2 uterine prolapse Gr 2 cystocele   HENT:     Head: Normocephalic and atraumatic.     Nose: Nose normal.  Abdominal:     General: There is no distension.     Palpations: Abdomen is soft.     Tenderness: There is no abdominal tenderness.  Musculoskeletal: Normal range of motion.  Neurological:     Mental Status: She is alert and oriented to person, place, and time.     Cranial Nerves: No cranial nerve deficit.  Skin:    General: Skin is warm and dry.  Psychiatric:        Attention and Perception: Attention normal.        Mood and Affect: Mood and affect normal.        Speech: Speech normal.        Behavior: Behavior normal.        Thought Content: Thought content normal.        Judgment: Judgment normal.    Pessary Care Pessary removed and cleaned.  Vagina checked - without erosions -  pessary replaced.  A/P:   ICD-10-CM   1. Abnormal urine odor  R82.90 POCT urinalysis dipstick  2. Uterine prolapse  N81.4   3. Cystocele with prolapse  N81.4   4. Acute cystitis with hematuria  N30.01 ciprofloxacin (CIPRO) 500 MG tablet  ABX Cipro for UTI given to pt  Pessary was cleaned and replaced today. Instructions given for care. Concerning symptoms to observe for are counseled to patient. Follow up scheduled for 3 months.  A total of 15 minutes were spent face-to-face with the patient during this encounter and over  half of that time dealt with counseling and coordination of care.  Barnett Applebaum, MD, [facility], [facility] 11/10/2018  10:59 AM

## 2018-11-10 NOTE — Patient Instructions (Signed)
Ciprofloxacin tablets What is this medicine? CIPROFLOXACIN (sip roe FLOX a sin) is a quinolone antibiotic. It is used to treat certain kinds of bacterial infections. It will not work for colds, flu, or other viral infections. This medicine may be used for other purposes; ask your health care provider or pharmacist if you have questions. COMMON BRAND NAME(S): Cipro What should I tell my health care provider before I take this medicine? They need to know if you have any of these conditions:  bone problems  diabetes  heart disease  high blood pressure  history of irregular heartbeat  history of low levels of potassium in the blood  joint problems  kidney disease  liver disease  mental illness  myasthenia gravis  seizures  tendon problems  tingling of the fingers or toes, or other nerve disorder  an unusual or allergic reaction to ciprofloxacin, other antibiotics or medicines, foods, dyes, or preservatives  pregnant or trying to get pregnant  breast-feeding How should I use this medicine? Take this medicine by mouth with a full glass of water. Follow the directions on the prescription label. You can take it with or without food. If it upsets your stomach, take it with food. Take your medicine at regular intervals. Do not take your medicine more often than directed. Take all of your medicine as directed even if you think you are better. Do not skip doses or stop your medicine early. Avoid antacids, aluminum, calcium, iron, magnesium, and zinc products for 6 hours before and 2 hours after taking a dose of this medicine. A special MedGuide will be given to you by the pharmacist with each prescription and refill. Be sure to read this information carefully each time. Talk to your pediatrician regarding the use of this medicine in children. Special care may be needed. Overdosage: If you think you have taken too much of this medicine contact a poison control center or emergency  room at once. NOTE: This medicine is only for you. Do not share this medicine with others. What if I miss a dose? If you miss a dose, take it as soon as you can. If it is almost time for your next dose, take only that dose. Do not take double or extra doses. What may interact with this medicine? Do not take this medicine with any of the following medications:  cisapride  dronedarone  flibanserin  lomitapide  pimozide  thioridazine  tizanidine This medicine may also interact with the following medications:  antacids  birth control pills  caffeine  certain medicines for diabetes, like glipizide, glyburide, or insulin  certain medicines that treat or prevent blood clots like warfarin  clozapine  cyclosporine  didanosine buffered tablets or powder  dofetilide  duloxetine  lanthanum carbonate  lidocaine  methotrexate  multivitamins  NSAIDS, medicines for pain and inflammation, like ibuprofen or naproxen  olanzapine  omeprazole  other medicines that prolong the QT interval (cause an abnormal heart rhythm)  phenytoin  probenecid  ropinirole  sevelamer  sildenafil  sucralfate  theophylline  ziprasidone  zolpidem This list may not describe all possible interactions. Give your health care provider a list of all the medicines, herbs, non-prescription drugs, or dietary supplements you use. Also tell them if you smoke, drink alcohol, or use illegal drugs. Some items may interact with your medicine. What should I watch for while using this medicine? Tell your doctor or health care provider if your symptoms do not start to get better or if they get worse.  This medicine may cause serious skin reactions. They can happen weeks to months after starting the medicine. Contact your health care provider right away if you notice fevers or flu-like symptoms with a rash. The rash may be red or purple and then turn into blisters or peeling of the skin. Or, you might  notice a red rash with swelling of the face, lips or lymph nodes in your neck or under your arms. Do not treat diarrhea with over the counter products. Contact your doctor if you have diarrhea that lasts more than 2 days or if it is severe and watery. Check with your doctor or health care provider if you get an attack of severe diarrhea, nausea and vomiting, or if you sweat a lot. The loss of too much body fluid can make it dangerous for you to take this medicine. This medicine may increase blood sugar. Ask your health care provider if changes in diet or medicines are needed if you have diabetes. You may get drowsy or dizzy. Do not drive, use machinery, or do anything that needs mental alertness until you know how this medicine affects you. Do not sit or stand up quickly, especially if you are an older patient. This reduces the risk of dizzy or fainting spells. This medicine can make you more sensitive to the sun. Keep out of the sun. If you cannot avoid being in the sun, wear protective clothing and use sunscreen. Do not use sun lamps or tanning beds/booths. What side effects may I notice from receiving this medicine? Side effects that you should report to your doctor or health care professional as soon as possible:  allergic reactions like skin rash or hives, swelling of the face, lips, or tongue  anxious  bloody or watery diarrhea  confusion  depressed mood  fast, irregular heartbeat  fever  hallucination, loss of contact with reality  joint, muscle, or tendon pain or swelling  loss of memory  pain, tingling, numbness in the hands or feet  redness, blistering, peeling or loosening of the skin, including inside the mouth  seizures  signs and symptoms of aortic dissection such as sudden chest, stomach, or back pain  signs and symptoms of high blood sugar such as being more thirsty or hungry or having to urinate more than normal. You may also feel very tired or have blurry  vision.  signs and symptoms of liver injury like dark yellow or brown urine; general ill feeling or flu-like symptoms; light-colored stools; loss of appetite; nausea; right upper belly pain; unusually weak or tired; yellowing of the eyes or skin  signs and symptoms of low blood sugar such as feeling anxious; confusion; dizziness; increased hunger; unusually weak or tired; sweating; shakiness; cold; irritable; headache; blurred vision; fast heartbeat; loss of consciousness; pale skin  suicidal thoughts or other mood changes  sunburn  unusually weak or tired Side effects that usually do not require medical attention (report to your doctor or health care professional if they continue or are bothersome):  dry mouth  headache  nausea  trouble sleeping This list may not describe all possible side effects. Call your doctor for medical advice about side effects. You may report side effects to FDA at 1-800-FDA-1088. Where should I keep my medicine? Keep out of the reach of children. Store at room temperature below 30 degrees C (86 degrees F). Keep container tightly closed. Throw away any unused medicine after the expiration date. NOTE: This sheet is a summary. It may not cover  all possible information. If you have questions about this medicine, talk to your doctor, pharmacist, or health care provider.  2020 Elsevier/Gold Standard (2018-05-12 11:26:08)

## 2018-11-12 LAB — URINE CULTURE

## 2018-11-14 DIAGNOSIS — N183 Chronic kidney disease, stage 3 (moderate): Secondary | ICD-10-CM | POA: Diagnosis not present

## 2018-11-14 DIAGNOSIS — N390 Urinary tract infection, site not specified: Secondary | ICD-10-CM | POA: Diagnosis not present

## 2018-11-14 DIAGNOSIS — N281 Cyst of kidney, acquired: Secondary | ICD-10-CM | POA: Diagnosis not present

## 2018-11-14 DIAGNOSIS — I10 Essential (primary) hypertension: Secondary | ICD-10-CM | POA: Diagnosis not present

## 2018-12-07 ENCOUNTER — Inpatient Hospital Stay: Payer: Medicare HMO | Attending: Oncology

## 2018-12-07 ENCOUNTER — Other Ambulatory Visit: Payer: Self-pay

## 2018-12-07 DIAGNOSIS — D473 Essential (hemorrhagic) thrombocythemia: Secondary | ICD-10-CM | POA: Diagnosis not present

## 2018-12-07 LAB — CBC WITH DIFFERENTIAL/PLATELET
Abs Immature Granulocytes: 0.06 10*3/uL (ref 0.00–0.07)
Basophils Absolute: 0.1 10*3/uL (ref 0.0–0.1)
Basophils Relative: 1 %
Eosinophils Absolute: 0.2 10*3/uL (ref 0.0–0.5)
Eosinophils Relative: 2 %
HCT: 39.4 % (ref 36.0–46.0)
Hemoglobin: 13.2 g/dL (ref 12.0–15.0)
Immature Granulocytes: 1 %
Lymphocytes Relative: 17 %
Lymphs Abs: 1.5 10*3/uL (ref 0.7–4.0)
MCH: 30.6 pg (ref 26.0–34.0)
MCHC: 33.5 g/dL (ref 30.0–36.0)
MCV: 91.4 fL (ref 80.0–100.0)
Monocytes Absolute: 0.8 10*3/uL (ref 0.1–1.0)
Monocytes Relative: 9 %
Neutro Abs: 6.4 10*3/uL (ref 1.7–7.7)
Neutrophils Relative %: 70 %
Platelets: 476 10*3/uL — ABNORMAL HIGH (ref 150–400)
RBC: 4.31 MIL/uL (ref 3.87–5.11)
RDW: 13.8 % (ref 11.5–15.5)
WBC: 9 10*3/uL (ref 4.0–10.5)
nRBC: 0 % (ref 0.0–0.2)

## 2019-02-08 ENCOUNTER — Ambulatory Visit: Payer: Medicare HMO | Admitting: Obstetrics & Gynecology

## 2019-02-14 ENCOUNTER — Encounter: Payer: Self-pay | Admitting: Obstetrics & Gynecology

## 2019-02-14 ENCOUNTER — Other Ambulatory Visit: Payer: Self-pay

## 2019-02-14 ENCOUNTER — Ambulatory Visit: Payer: Medicare HMO | Admitting: Obstetrics & Gynecology

## 2019-02-14 VITALS — BP 120/70

## 2019-02-14 DIAGNOSIS — N814 Uterovaginal prolapse, unspecified: Secondary | ICD-10-CM

## 2019-02-14 NOTE — Progress Notes (Signed)
HPI:      Gina [name] is a 85 y.o. [phone] who presents today for her pessary follow up and examination related to her pelvic floor weakening.  Pt reports tolerating the pessary well with  no vaginal bleeding and  no vaginal discharge.  Symptoms of pelvic floor weakening have greatly improved. She is voiding and defecating without difficulty. She currently has a #3Ring pessary.  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 Also,  has a past surgical history that includes Tonsillectomy; Appendectomy; Cesarean section; Joint replacement (Right); Thyroidectomy; Colon surgery; Cataract extraction w/PHACO (Left, 03/25/2015); Adenoidectomy; biopsy, right breast ; Total hip arthroplasty; Thyroid lobectomy; and Upper gi endoscopy (10/17/2008)., family history includes Dementia in her maternal grandfather; Stroke in her father and mother.,  reports that she has quit smoking. She has never used smokeless tobacco. She reports that she does not drink alcohol or use drugs.  She has a current medication list which includes the following prescription(s): amlodipine, aspirin ec, atorvastatin, vitamin d3, ciprofloxacin, famotidine, hydroxyurea, irbesartan, ketoconazole, levothyroxine, and polyethylene glycol powder. Also, is allergic to novocain [procaine].  Review of Systems  All other systems reviewed and are negative.   Objective: BP 120/70  Physical Exam Constitutional:      General: She is not in acute distress.    Appearance:  She is well-developed.  Genitourinary:     Pelvic exam was performed with patient supine.     Vagina and uterus normal.     No vaginal erythema or bleeding.     No cervical motion tenderness, discharge, polyp or nabothian cyst.     Uterus is mobile.     Uterus is not enlarged.     No uterine mass detected.    Uterus is midaxial.     No right or left adnexal mass present.     Right adnexa not tender.     Left adnexa not tender.     Genitourinary Comments: Gr 2 uterine prolapse Gr 2 cystocele    HENT:     Head: Normocephalic and atraumatic.     Nose: Nose normal.  Abdominal:     General: There is no distension.     Palpations: Abdomen is soft.     Tenderness: There is no abdominal tenderness.  Musculoskeletal:        General: Normal range of motion.  Neurological:     Mental Status: She is alert and oriented to person, place, and time.     Cranial Nerves: No cranial nerve deficit.  Skin:    General: Skin is warm and dry.  Psychiatric:        Attention and Perception: Attention normal.        Mood and Affect: Mood and affect normal.        Speech: Speech normal.        Behavior: Behavior normal.        Thought Content: Thought content normal.        Judgment: Judgment normal.     Pessary Care Pessary removed and cleaned.  Vagina checked - without erosions - pessary replaced.  A/P:   ICD-10-CM   1. Uterine prolapse  N81.4   2.  Cystocele with prolapse  N81.4    Pessary was cleaned and replaced today. Instructions given for care. Concerning symptoms to observe for are counseled to patient. Follow up scheduled for 3 months.  A total of 15 minutes were spent face-to-face with the patient during this encounter and over half of that time dealt with counseling and coordination of care.  Barnett Applebaum, MD, [facility], [facility] 02/14/2019  1:17 PM

## 2019-02-14 NOTE — Patient Instructions (Signed)
MERRY CHRISTMAS! From Dr Kenton Kingfisher

## 2019-02-27 ENCOUNTER — Telehealth: Payer: Self-pay | Admitting: Obstetrics & Gynecology

## 2019-02-27 NOTE — Telephone Encounter (Signed)
Patient is calling needing an order for diagnostic bilateral mammogram for after March. Please advise

## 2019-02-28 ENCOUNTER — Other Ambulatory Visit: Payer: Self-pay | Admitting: Obstetrics & Gynecology

## 2019-02-28 DIAGNOSIS — Z1239 Encounter for other screening for malignant neoplasm of breast: Secondary | ICD-10-CM

## 2019-02-28 NOTE — Telephone Encounter (Signed)
Left message to advose pt to schedule her mammo, orders have been placed

## 2019-03-03 ENCOUNTER — Telehealth: Payer: Self-pay | Admitting: Obstetrics & Gynecology

## 2019-03-03 NOTE — Telephone Encounter (Signed)
Patient is calling about her mammogram order that was to be sent to [facility]. Looks like the order was sent to [place]. Could we get that changed for [facility]. Pt would like to have the mammogram request sent to fax # [phone].   CB# [phone]

## 2019-03-10 ENCOUNTER — Encounter: Payer: Self-pay | Admitting: Oncology

## 2019-03-10 NOTE — Progress Notes (Signed)
[facility]  [phone] [phone]) [phone]  ID: [name] OB: [id number]  MR#: [id number]  [id number]  Patient Care Team: Arnetha Courser, MD as PCP - General (Family Medicine) Murlean Iba, MD (Internal Medicine) Gae Dry, MD as Referring Physician (Obstetrics and Gynecology) Estill Cotta, MD (Ophthalmology) Anabel Bene, MD as Referring Physician (Neurology) Beverly Gust, MD (Otolaryngology) Brendolyn Patty, MD (Dermatology) Lloyd Huger, MD as Consulting Physician (Oncology)  CHIEF COMPLAINT: Essential thrombocytosis  INTERVAL HISTORY: Patient returns to clinic today for repeat laboratory work and routine 2-month evaluation.  She continues to feel well and remains asymptomatic.  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  She continues to tolerate Hydrea well without significant side effects. She has no neurologic complaints.  She denies any recent fevers or illnesses.  She has a good appetite and denies weight loss.  She denies any chest pain, shortness of breath, cough, or hemoptysis.  She denies any nausea, vomiting, constipation, or diarrhea.  She has no urinary complaints.  Patient offers no further specific complaints today.   REVIEW OF SYSTEMS:   Review of Systems  Constitutional: Positive for malaise/fatigue. Negative for fever and weight loss.  Respiratory: Negative.  Negative for cough, hemoptysis and shortness of breath.   Cardiovascular: Negative.  Negative for chest pain and leg swelling.  Gastrointestinal: Negative.  Negative for abdominal pain.  Genitourinary: Negative.  Negative for dysuria.  Musculoskeletal: Negative.  Negative for back pain.  Skin: Negative.  Negative for rash.  Neurological: Negative.  Negative for focal weakness, weakness and headaches.  Psychiatric/Behavioral: Negative.  The patient is not nervous/anxious.     As per HPI.  Otherwise, a complete review of systems is negative.  PAST MEDICAL HISTORY: Past Medical History:  Diagnosis Date  . Breast mass in female    right breast  . Cataract   . Difficult intubation 03/03/2016   September 06, 2013; left nasal fiberoptic intubation #7 ETT; see letter from Dr. Loanne Drilling, Dept of Anesthesiology, [facility]  . Diverticulitis   . Diverticulosis 05/28/2015  . Essential thrombocytosis ([place]) 07/23/2015  . Genital prolapse   . GERD (gastroesophageal reflux disease)   . 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 Mike Gip  . Hyperlipemia   . Hyperlipidemia    history of   . Hypothyroidism   . Mild atherosclerosis of carotid artery 06/25/2015  . Morton's neuroma   . MRI contraindicated due to metal implant   . Prolapse of uterus   . Radiculopathy   . Renal cyst   . Rosacea   . Shingles   . Thrombocytosis ([place])     PAST SURGICAL HISTORY: Past Surgical History:  Procedure Laterality Date  . ADENOIDECTOMY    . APPENDECTOMY    . biopsy, right breast     . CATARACT EXTRACTION W/PHACO Left 03/25/2015   Procedure: CATARACT EXTRACTION PHACO AND INTRAOCULAR LENS PLACEMENT (IOC);  Surgeon: Estill Cotta, MD;  Location: [facility] ORS;  Service: Ophthalmology;  Laterality: Left;  [place] 02:09 AP% 27.3 CDE 59.97 fluid pack lot [id number]  . CESAREAN SECTION    . COLON SURGERY    . JOINT REPLACEMENT Right    total hip  . THYROID LOBECTOMY    . THYROIDECTOMY     partial  . TONSILLECTOMY    . TOTAL HIP ARTHROPLASTY    . UPPER GI ENDOSCOPY  10/17/2008    FAMILY HISTORY: Family History  Problem Relation Age of  Onset  . Stroke Mother   . Stroke Father   . Dementia Maternal Grandfather     ADVANCED DIRECTIVES (Y/N):  N  HEALTH MAINTENANCE: Social History   Tobacco Use  . Smoking status: Former Research scientist (life sciences)  . Smokeless tobacco: Never Used  Substance Use Topics  . Alcohol use: No    Alcohol/week: 0.0 standard drinks  . Drug use: No     Colonoscopy:  PAP:  Bone  density:  Lipid panel:  Allergies  Allergen Reactions  . Novocain [Procaine] Palpitations    Current Outpatient Medications  Medication Sig Dispense Refill  . amLODipine (NORVASC) 5 MG tablet Take 1 tablet (5 mg total) by mouth daily. 90 tablet 3  . aspirin EC 81 MG tablet Take 81 mg by mouth daily.     Marland Kitchen atorvastatin (LIPITOR) 20 MG tablet TAKE 1 TABLET BY MOUTH AT BEDTIME 90 tablet 2  . Cholecalciferol (VITAMIN D3) 1000 units CAPS Take by mouth daily.     . ciprofloxacin (CIPRO) 500 MG tablet Take 1 tablet (500 mg total) by mouth 2 (two) times daily. 10 tablet 0  . famotidine (PEPCID) 20 MG tablet Take 1 tablet (20 mg total) by mouth 2 (two) times daily. (Patient taking differently: Take 20 mg by mouth as needed. ) 60 tablet 2  . fluticasone (FLONASE) 50 MCG/ACT nasal spray     . hydroxyurea (HYDREA) 500 MG capsule Take 1 capsule (500 mg total) by mouth daily. Take 1 capsule on Monday, Wednesday, Friday and Saturday. 30 capsule 3  . irbesartan (AVAPRO) 150 MG tablet Take 1 tablet (150 mg total) by mouth daily. 90 tablet 3  . ketoconazole (NIZORAL) 2 % shampoo Apply 1 application topically 2 (two) times a week.     . levothyroxine (SYNTHROID) 88 MCG tablet TAKE 1 TABLET BY MOUTH ONCE DAILY BEFORE BREAKFAST 90 tablet 3  . polyethylene glycol powder (GLYCOLAX/MIRALAX) powder Take 1 Container by mouth once.     No current facility-administered medications for this visit.    OBJECTIVE: Vitals:   03/14/19 1415  BP: (!) 154/74  Pulse: 95  Temp: (!) 97.3 F (36.3 C)     Body mass index is 25.01 kg/m.    ECOG FS:0 - Asymptomatic  General: Well-developed, well-nourished, no acute distress. Eyes: Pink conjunctiva, anicteric sclera. HEENT: Normocephalic, moist mucous membranes. Lungs: No audible wheezing or coughing. Heart: Regular rate and rhythm. Abdomen: Soft, nontender, no obvious distention. Musculoskeletal: No edema, cyanosis, or clubbing. Neuro: Alert, answering all questions  appropriately. Cranial nerves grossly intact. Skin: No rashes or petechiae noted. Psych: Normal affect.   LAB RESULTS:  Lab Results  Component Value Date   NA 131 (L) 10/14/2018   K 4.1 10/14/2018   CL 99 10/14/2018   CO2 25 10/14/2018   GLUCOSE 126 (H) 10/14/2018   BUN 21 10/14/2018   CREATININE 1.09 (H) 10/14/2018   CALCIUM 9.8 10/14/2018   PROT 7.6 09/12/2018   ALBUMIN 4.3 02/28/2018   AST 19 09/12/2018   ALT 11 09/12/2018   ALKPHOS 74 02/28/2018   BILITOT 1.2 09/12/2018   GFRNONAA 45 (L) 10/14/2018   GFRAA 52 (L) 10/14/2018    Lab Results  Component Value Date   WBC 11.1 (H) 03/14/2019   NEUTROABS 8.0 (H) 03/14/2019   HGB 13.7 03/14/2019   HCT 42.1 03/14/2019   MCV 93.6 03/14/2019   PLT 573 (H) 03/14/2019     STUDIES: No results found.  ASSESSMENT: Essential thrombocytosis  PLAN:   1. Essential  thrombocytosis: Since decreasing patient's Hydrea dose to 3 days a week on Monday, Wednesday, and Friday, her platelet count has trended up to 573.  She also has an increased white blood cell count of 11.1.  Patient does not wish to increase her pills back to 4 times a week, therefore will maintain current dosing schedule.  Continue 81 mg of aspirin.  If patient platelet count continues to trend up and becomes greater than 600, will increase dose of Hydrea.  No further intervention is needed.  Return to clinic in 6 months with repeat laboratory work and further evaluation.  2.  Renal insufficiency: Mild, monitor.  Continue follow-up with nephrology as scheduled. 3.  Leukocytosis: Mild, monitor.  I spent a total of 20 minutes reviewing chart data, face-to-face evaluation with the patient, counseling and coordination of care as detailed above.   Patient expressed understanding and was in agreement with this plan. She also understands that She can call clinic at any time with any questions, concerns, or complaints.    Lloyd Huger, MD   03/14/2019 3:19 PM

## 2019-03-10 NOTE — Progress Notes (Signed)
Pt reports bilateral hand numbness for the last two weeks, is concerned that it's related to her radiculopathy. Also reports shocking sensation.

## 2019-03-13 ENCOUNTER — Other Ambulatory Visit: Payer: Self-pay

## 2019-03-13 DIAGNOSIS — D75839 Thrombocytosis, unspecified: Secondary | ICD-10-CM

## 2019-03-13 DIAGNOSIS — D473 Essential (hemorrhagic) thrombocythemia: Secondary | ICD-10-CM

## 2019-03-14 ENCOUNTER — Other Ambulatory Visit: Payer: Self-pay

## 2019-03-14 ENCOUNTER — Inpatient Hospital Stay: Payer: Medicare HMO

## 2019-03-14 ENCOUNTER — Inpatient Hospital Stay: Payer: Medicare HMO | Attending: Oncology | Admitting: Oncology

## 2019-03-14 ENCOUNTER — Telehealth: Payer: Self-pay

## 2019-03-14 ENCOUNTER — Other Ambulatory Visit: Payer: Self-pay | Admitting: Obstetrics & Gynecology

## 2019-03-14 VITALS — BP 154/74 | HR 95 | Temp 97.3°F | Wt 145.7 lb

## 2019-03-14 DIAGNOSIS — N289 Disorder of kidney and ureter, unspecified: Secondary | ICD-10-CM

## 2019-03-14 DIAGNOSIS — D72829 Elevated white blood cell count, unspecified: Secondary | ICD-10-CM | POA: Insufficient documentation

## 2019-03-14 DIAGNOSIS — Z7982 Long term (current) use of aspirin: Secondary | ICD-10-CM | POA: Insufficient documentation

## 2019-03-14 DIAGNOSIS — D473 Essential (hemorrhagic) thrombocythemia: Secondary | ICD-10-CM

## 2019-03-14 DIAGNOSIS — Z87891 Personal history of nicotine dependence: Secondary | ICD-10-CM | POA: Insufficient documentation

## 2019-03-14 DIAGNOSIS — Z9889 Other specified postprocedural states: Secondary | ICD-10-CM

## 2019-03-14 DIAGNOSIS — D75839 Thrombocytosis, unspecified: Secondary | ICD-10-CM

## 2019-03-14 LAB — CBC WITH DIFFERENTIAL/PLATELET
Abs Immature Granulocytes: 0.06 10*3/uL (ref 0.00–0.07)
Basophils Absolute: 0.1 10*3/uL (ref 0.0–0.1)
Basophils Relative: 1 %
Eosinophils Absolute: 0.2 10*3/uL (ref 0.0–0.5)
Eosinophils Relative: 2 %
HCT: 42.1 % (ref 36.0–46.0)
Hemoglobin: 13.7 g/dL (ref 12.0–15.0)
Immature Granulocytes: 1 %
Lymphocytes Relative: 17 %
Lymphs Abs: 1.9 10*3/uL (ref 0.7–4.0)
MCH: 30.4 pg (ref 26.0–34.0)
MCHC: 32.5 g/dL (ref 30.0–36.0)
MCV: 93.6 fL (ref 80.0–100.0)
Monocytes Absolute: 0.9 10*3/uL (ref 0.1–1.0)
Monocytes Relative: 8 %
Neutro Abs: 8 10*3/uL — ABNORMAL HIGH (ref 1.7–7.7)
Neutrophils Relative %: 71 %
Platelets: 573 10*3/uL — ABNORMAL HIGH (ref 150–400)
RBC: 4.5 MIL/uL (ref 3.87–5.11)
RDW: 14 % (ref 11.5–15.5)
WBC: 11.1 10*3/uL — ABNORMAL HIGH (ref 4.0–10.5)
nRBC: 0 % (ref 0.0–0.2)

## 2019-03-14 NOTE — Telephone Encounter (Signed)
Order in [place] placed

## 2019-03-14 NOTE — Telephone Encounter (Signed)
Bonnita Nasuti from [facility] mammography is calling to get an order for pt's mammogram. Bonnita Nasuti states pt had a breast biopsy in 05/2018. The protocol for one year out should be Bilateral diagnostic. Please order so they can get pt scheduled.

## 2019-03-16 ENCOUNTER — Encounter: Payer: Self-pay | Admitting: Internal Medicine

## 2019-03-16 ENCOUNTER — Ambulatory Visit (INDEPENDENT_AMBULATORY_CARE_PROVIDER_SITE_OTHER): Payer: Medicare HMO | Admitting: Internal Medicine

## 2019-03-16 ENCOUNTER — Other Ambulatory Visit: Payer: Self-pay

## 2019-03-16 VITALS — BP 138/72 | HR 89 | Temp 97.1°F | Resp 16 | Ht 64.0 in | Wt 145.0 lb

## 2019-03-16 DIAGNOSIS — K219 Gastro-esophageal reflux disease without esophagitis: Secondary | ICD-10-CM

## 2019-03-16 DIAGNOSIS — E785 Hyperlipidemia, unspecified: Secondary | ICD-10-CM | POA: Diagnosis not present

## 2019-03-16 DIAGNOSIS — I10 Essential (primary) hypertension: Secondary | ICD-10-CM

## 2019-03-16 DIAGNOSIS — E890 Postprocedural hypothyroidism: Secondary | ICD-10-CM

## 2019-03-16 DIAGNOSIS — N1831 Chronic kidney disease, stage 3a: Secondary | ICD-10-CM | POA: Diagnosis not present

## 2019-03-16 DIAGNOSIS — M5412 Radiculopathy, cervical region: Secondary | ICD-10-CM | POA: Diagnosis not present

## 2019-03-16 DIAGNOSIS — D473 Essential (hemorrhagic) thrombocythemia: Secondary | ICD-10-CM

## 2019-03-16 DIAGNOSIS — K5901 Slow transit constipation: Secondary | ICD-10-CM

## 2019-03-16 NOTE — Patient Instructions (Signed)
Call the office after you have received the second dose of the Covid vaccine as we discussed to obtain the Shingrix  Pneumonia vaccines.

## 2019-03-16 NOTE — Telephone Encounter (Signed)
Pt needs the order for her mammo to go to [facility], and the order needs to states Diagnostic Bilateral.

## 2019-03-16 NOTE — Progress Notes (Signed)
Patient ID: Gina [name], female    DOB: 06-Jun-1926, 86 y.o.   MRN: [id number]  PCP: Towanda Malkin, MD  Chief Complaint  Patient presents with  . Hypothyroidism  . Chronic Kidney Disease  . Hyperlipidemia    Subjective:   Gina [name] is a 86 y.o. female, presents to clinic with CC of the following:  Chief Complaint  Patient presents with  . Hypothyroidism  . Chronic Kidney Disease  . Hyperlipidemia   HPI: Last visit here was 09/12/2018 and that note reviewed  She was seen in the ER 10/14/2018 with SOB concerns.She noted was more radiculopathy concerns and pains down her left arm and that prompted her to go.That work-up was without actionable derangements, creatinine was noted to be slightly improved from prior.  EKG was without acute ischemic changes, high-sensitivity troponin x2 were less than 18 and delta less than 5.  BNP was within normal limits.  Chest x-ray was clear. She was discharged from ER, no hospital admission.   In general, she noted she is doing well.  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 She denies any recent fevers or Covid concerning sx's. She denies any chest pain, shortness of breath, LE swelling.   She denies any nausea, vomiting, + constipation which is chronic and belches often, takes miralax and helps some  no urinary complaints.  Still has intermittent numbness in her distal finger tips, and occas pains down from the neck into her upper left back area and at times into the arms, not dropping things, not weak. Notes can be like a shock at times but not persistent or limiting. No numbness or tingling in her feet. Takes tylenol prn presently and not often. She noted she is not anxious to add any more medicines presently.  Regarding some specific problems following:   HTN Patient is on amlodipine 5mg  daily and  irbesartan 150mg  daily .  Takes medications as prescribed, not missing doses routinely.  Tries to eat a healthy diet, limiting salt She occasionally checks blood pressures at home with range of 120-130/low 70's Denies chest pain, SOB, increased headaches.   BP Readings from Last 3 Encounters:  03/16/19 138/72  03/14/19 (!) 154/74  02/14/19 120/70     Hyperlipidemia Patient rx atorvastatin 20mg  nightly. Takes medications as prescribed. Diet: eats fruits and vegetables, avoids fried foods, likes seafood and eats fish frequently  Denies myalgias (except an occasional charlie horse noted) Desires to have lipids checked today Lab Results  Component Value Date   CHOL 171 09/12/2018   CHOL 158 01/07/2018   CHOL 148 01/05/2017   Lab Results  Component Value Date   HDL 51 09/12/2018   HDL 53 01/07/2018   HDL 61 01/05/2017   Lab Results  Component Value Date   LDLCALC 94 09/12/2018   LDLCALC 80 01/07/2018   LDLCALC 64 01/05/2017   Lab Results  Component Value Date   TRIG 162 (H) 09/12/2018   TRIG 151 (H) 01/07/2018   TRIG 142 01/05/2017   Lab Results  Component Value Date   CHOLHDL 3.4 09/12/2018   CHOLHDL 3.0 01/07/2018   CHOLHDL 2.4 01/05/2017    Hypothyroidism, h/o partial thyroidectomy Patient rx levothryoxine. Has been on this dose for many years. Desires to have checked today  Lab Results  Component Value Date   TSH 1.24 09/12/2018    Thrombocytosis/leukocytosis Follows up  with Dr. Grayland Ormond; last visit 03/14/2019 and noted was doing well. That note was reviewed. Takes hydroxyurea - may increase dose back to four times daily in near future with platelet count increased last visit,currently taking three times daily (pending follow-up checks).  CKD - stage 3, renal cyst, HTN   continue to follow with nephrology (last visit 10/2018), note reviewed  GERD: Takes pepcid prn and helpful, about 2-3 times per week at present  Cervical radiculopathy  Sx's as  noted above, Takes tylenol PRN. Noted not limiting her activities presently and has tried massage to help, also does exercises routinely  Constipation (intermittent): long h/o this  Managing with diet and takes miralax prn, had tried colace when suggested this possibility, but didn't think helped and stopped.  OB-GYN follow-up: Uterine prolapse, pessary replaced 01/2019 visit with Dr. Kenton Kingfisher, OB/GYN Prior breast biopsy 05/2018 - f/u mammography order recently placed  Continue to follow with Dr. Iona Beard, s/p surgery: Saw ophthalmology 09/22/2018 for f/u and ophth continues to follow. She noted more difficulty with vision from right eye now and not wanting to have an elective procedure now (cataract on right) due to Covid.  HIM - she had shingles once in past and had zostavax in past. Discussed Shingrix and will pursue after gets Covid vaccine (on waiting list and supposed to get by the end of this month). Also will get the pneumovax 23 vaccine.   Patient Active Problem List   Diagnosis Date Noted  . DNR (do not resuscitate) 04/22/2018  . Venous stasis 04/21/2018  . Cystocele with prolapse 04/01/2017  . Hemorrhoids 07/29/2016  . Gilbert syndrome 06/30/2016  . Essential thrombocytosis ([place]) 07/23/2015  . Mild atherosclerosis of carotid artery 06/25/2015  . Dyslipidemia 06/03/2015  . Diverticulosis 05/28/2015  . GERD without esophagitis 02/06/2015  . Neuropathy 01/31/2015  . Cervical radiculopathy 01/31/2015  . Stage 3 chronic kidney disease 10/06/2014  . Renal cyst 10/06/2014  . Uterine prolapse 10/06/2014  . Cervical radiculopathy due to degenerative joint disease of spine 10/06/2014  . History of right hip replacement 10/06/2014  . Hypothyroidism 10/05/2014  . Essential hypertension 09/13/2014      Current Outpatient Medications:  .  amLODipine (NORVASC) 5 MG tablet, Take 1 tablet (5 mg total) by mouth daily., Disp: 90 tablet, Rfl: 3 .  aspirin EC 81 MG tablet, Take  81 mg by mouth daily. , Disp: , Rfl:  .  atorvastatin (LIPITOR) 20 MG tablet, TAKE 1 TABLET BY MOUTH AT BEDTIME, Disp: 90 tablet, Rfl: 2 .  Cholecalciferol (VITAMIN D3) 1000 units CAPS, Take by mouth daily. , Disp: , Rfl:  .  famotidine (PEPCID) 20 MG tablet, Take 1 tablet (20 mg total) by mouth 2 (two) times daily., Disp: 60 tablet, Rfl: 2 .  hydroxyurea (HYDREA) 500 MG capsule, Take 1 capsule (500 mg total) by mouth daily. Take 1 capsule on Monday, Wednesday, Friday and Saturday., Disp: 30 capsule, Rfl: 3 .  irbesartan (AVAPRO) 150 MG tablet, Take 1 tablet (150 mg total) by mouth daily., Disp: 90 tablet, Rfl: 3 .  ketoconazole (NIZORAL) 2 % shampoo, Apply 1 application topically 2 (two) times a week. , Disp: , Rfl:  .  levothyroxine (SYNTHROID) 88 MCG tablet, TAKE 1 TABLET BY MOUTH ONCE DAILY BEFORE BREAKFAST, Disp: 90 tablet, Rfl: 3 .  polyethylene glycol powder (GLYCOLAX/MIRALAX) powder, Take 1 Container by mouth once., Disp: , Rfl:  .  fluticasone (FLONASE) 50 MCG/ACT nasal spray, , Disp: , Rfl:  Allergies  Allergen Reactions  . Novocain [Procaine] Palpitations     Past Surgical History:  Procedure Laterality Date  . ADENOIDECTOMY    . APPENDECTOMY    . biopsy, right breast     . CATARACT EXTRACTION W/PHACO Left 03/25/2015   Procedure: CATARACT EXTRACTION PHACO AND INTRAOCULAR LENS PLACEMENT (IOC);  Surgeon: Estill Cotta, MD;  Location: [facility] ORS;  Service: Ophthalmology;  Laterality: Left;  [place] 02:09 AP% 27.3 CDE 59.97 fluid pack lot [id number]  . CESAREAN SECTION    . COLON SURGERY    . JOINT REPLACEMENT Right    total hip  . THYROID LOBECTOMY    . THYROIDECTOMY     partial  . TONSILLECTOMY    . TOTAL HIP ARTHROPLASTY    . UPPER GI ENDOSCOPY  10/17/2008     Family History  Problem Relation Age of Onset  . Stroke Mother   . Stroke Father   . Dementia Maternal Grandfather    Tob - former smoker, 3 children, also with grandchildren.  Social History    Socioeconomic History  . Marital status: Married    Spouse name: Not on file  . Number of children: Not on file  . Years of education: Not on file  . Highest education level: Not on file  Occupational History  . Not on file  Tobacco Use  . Smoking status: Former Research scientist (life sciences)  . Smokeless tobacco: Never Used  Substance and Sexual Activity  . Alcohol use: No    Alcohol/week: 0.0 standard drinks  . Drug use: No  . Sexual activity: Not Currently  Other Topics Concern  . Not on file  Social History Narrative  . Not on file   Social Determinants of Health   Financial Resource Strain:   . Difficulty of Paying Living Expenses: Not on file  Food Insecurity:   . Worried About Charity fundraiser in the Last Year: Not on file  . Ran Out of Food in the Last Year: Not on file  Transportation Needs:   . Lack of Transportation (Medical): Not on file  . Lack of Transportation (Non-Medical): Not on file  Physical Activity:   . Days of Exercise per Week: Not on file  . Minutes of Exercise per Session: Not on file  Stress:   . Feeling of Stress : Not on file  Social Connections:   . Frequency of Communication with Friends and Family: Not on file  . Frequency of Social Gatherings with Friends and Family: Not on file  . Attends Religious Services: Not on file  . Active Member of Clubs or Organizations: Not on file  . Attends Archivist Meetings: Not on file  . Marital Status: Not on file  Intimate Partner Violence:   . Fear of Current or Ex-Partner: Not on file  . Emotionally Abused: Not on file  . Physically Abused: Not on file  . Sexually Abused: Not on file    I personally reviewed active problem list, medication list, allergies, social history, health maintenance, notes from last encounter with the patient/caregiver today.  ROS: As per HPI, otherwise no specific complaints on a limited and focused system review   Results for orders placed or performed in visit on 03/14/19  (from the past 72 hour(s))  CBC with Differential/Platelet     Status: Abnormal   Collection Time: 03/14/19  1:57 PM  Result Value Ref Range   WBC 11.1 (H) 4.0 - 10.5 K/uL   RBC 4.50 3.87 - 5.11  MIL/uL   Hemoglobin 13.7 12.0 - 15.0 g/dL   HCT 42.1 36.0 - 46.0 %   MCV 93.6 80.0 - 100.0 fL   MCH 30.4 26.0 - 34.0 pg   MCHC 32.5 30.0 - 36.0 g/dL   RDW 14.0 11.5 - 15.5 %   Platelets 573 (H) 150 - 400 K/uL   nRBC 0.0 0.0 - 0.2 %   Neutrophils Relative % 71 %   Neutro Abs 8.0 (H) 1.7 - 7.7 K/uL   Lymphocytes Relative 17 %   Lymphs Abs 1.9 0.7 - 4.0 K/uL   Monocytes Relative 8 %   Monocytes Absolute 0.9 0.1 - 1.0 K/uL   Eosinophils Relative 2 %   Eosinophils Absolute 0.2 0.0 - 0.5 K/uL   Basophils Relative 1 %   Basophils Absolute 0.1 0.0 - 0.1 K/uL   Immature Granulocytes 1 %   Abs Immature Granulocytes 0.06 0.00 - 0.07 K/uL    Comment: Performed at [facility], [address] [place], [place] [address]     PHQ2/9: Depression screen [facility] 2/9 03/16/2019 09/12/2018 04/21/2018 01/07/2018 12/24/2017  Decreased Interest 0 0 0 0 0  Down, Depressed, Hopeless 1 0 0 0 0  PHQ - 2 Score 1 0 0 0 0  Altered sleeping 0 0 0 0 0  Tired, decreased energy 0 0 0 0 0  Change in appetite 0 0 0 0 0  Feeling bad or failure about yourself  0 0 0 0 0  Trouble concentrating 0 0 0 0 0  Moving slowly or fidgety/restless 0 0 0 0 0  Suicidal thoughts 0 0 0 0 0  PHQ-9 Score 1 0 0 0 0  Difficult doing work/chores Not difficult at all Not difficult at all Not difficult at all Not difficult at all Not difficult at all   PHQ-2/9 Result is negative for major depression concerns  Fall Risk: Fall Risk  03/16/2019 09/12/2018 04/21/2018 01/07/2018 12/24/2017  Falls in the past year? 0 0 0 0 0  Number falls in past yr: 0 0 - 0 0  Injury with Fall? 0 0 - 0 -      Objective:   Vitals:   03/16/19 0951  BP: 138/72  Pulse: 89  Resp: 16  Temp: (!) 97.1 F (36.2 C)  TempSrc: Temporal  Weight: 145 lb (65.8  kg)  Height: 5\' 4"  (1.626 m)    Body mass index is 24.89 kg/m.  Physical Exam   NAD, masked, very pleasant HEENT - + glasses, sclera anicteric, PERRL, + arcus,  EOMI, conj - non-inj'ed, No sinus tenderness, TM's and canals clear, dentition great Neck - Good ROM, NT with palpation over cervical spine, supple, no adenopathy, no TM, small scar present, carotids 2+ and = without bruits bilat Car - RRR without m/g/r Pulm- CTA without wheeze or rales Abd - soft, NT, ND, BS+, no obvious HSM, no masses Back - no CVA tenderness Ext - no LE edema, no active joints Breast/pelvic - follows with ob/gyn  Neuro - affect was not flat, very appropriate with conversation  Grossly non-focal with good strength on testing upper and lower ext's, good grip and shoulder shrug , sensation intact to LT in distal extremities, good pulses distal upper ext's and good cap refill in hands, gait normal,         Results for orders placed or performed in visit on 03/14/19  CBC with Differential/Platelet  Result Value Ref Range   WBC 11.1 (H) 4.0 - 10.5  K/uL   RBC 4.50 3.87 - 5.11 MIL/uL   Hemoglobin 13.7 12.0 - 15.0 g/dL   HCT 42.1 36.0 - 46.0 %   MCV 93.6 80.0 - 100.0 fL   MCH 30.4 26.0 - 34.0 pg   MCHC 32.5 30.0 - 36.0 g/dL   RDW 14.0 11.5 - 15.5 %   Platelets 573 (H) 150 - 400 K/uL   nRBC 0.0 0.0 - 0.2 %   Neutrophils Relative % 71 %   Neutro Abs 8.0 (H) 1.7 - 7.7 K/uL   Lymphocytes Relative 17 %   Lymphs Abs 1.9 0.7 - 4.0 K/uL   Monocytes Relative 8 %   Monocytes Absolute 0.9 0.1 - 1.0 K/uL   Eosinophils Relative 2 %   Eosinophils Absolute 0.2 0.0 - 0.5 K/uL   Basophils Relative 1 %   Basophils Absolute 0.1 0.0 - 0.1 K/uL   Immature Granulocytes 1 %   Abs Immature Granulocytes 0.06 0.00 - 0.07 K/uL       Assessment & Plan:   1. Essential hypertension - controlled  - COMPLETE METABOLIC PANEL WITH GFR Continue current BP medications to manage Importance of healthy diet and staying active  noted Continue with home BP checks    2. Postoperative hypothyroidism - s/p partial thyroidectmy  - TSH Cont med  3. Dyslipidemia  - Lipid panel - COMPLETE METABOLIC PANEL WITH GFR Continue medication management - statin Importance of diet modifications emphasized Diet modifications to include a heart healthy diet with fruits and vegetables, whole grains,poultry, fish and nuts with less sugary foods and beverages and she is doing well with diet at present   4. GERD without esophagitis Cont with diet management and prn famotidine  5. Essential thrombocytosis ([place]) Cont to follow with hematology  6. Stage 3a chronic kidney disease Cont to follow with nephrology - COMPLETE METABOLIC PANEL WITH GFR  7. Cervical radiculopathy Discuss potential next steps, including an x-ray, and did discuss potential medicines that may help with some radicular symptoms.  She was not anxious to add any medicines presently, wants to continue with massage, staying active with range of motion activities, and using as needed Tylenol, and if symptoms are more problematic over time we will follow-up.  8. Slow transit constipation Continue with dietary measures, the MiraLAX product she takes as needed, a stool softener like Colace was mentioned as an option although she noted that was not helpful in the past. We will check her TSH as well, to help ensure her thyroid status is not contributing  Continued follow up with ophth and Ob/gyn as noted in HPI and also follow-up after Covid vaccination obtained  for Shingrix and pneumonia vaccine.    Towanda Malkin, MD 03/16/19 10:03 AM

## 2019-03-17 LAB — COMPLETE METABOLIC PANEL WITH GFR
AG Ratio: 1.6 (calc) (ref 1.0–2.5)
ALT: 25 U/L (ref 6–29)
AST: 26 U/L (ref 10–35)
Albumin: 4.6 g/dL (ref 3.6–5.1)
Alkaline phosphatase (APISO): 87 U/L (ref 37–153)
BUN/Creatinine Ratio: 21 (calc) (ref 6–22)
BUN: 26 mg/dL — ABNORMAL HIGH (ref 7–25)
CO2: 22 mmol/L (ref 20–32)
Calcium: 10.4 mg/dL (ref 8.6–10.4)
Chloride: 100 mmol/L (ref 98–110)
Creat: 1.23 mg/dL — ABNORMAL HIGH (ref 0.60–0.88)
GFR, Est African American: 45 mL/min/{1.73_m2} — ABNORMAL LOW (ref >=60)
GFR, Est Non African American: 39 mL/min/{1.73_m2} — ABNORMAL LOW (ref >=60)
Globulin: 2.8 g/dL (calc) (ref 1.9–3.7)
Glucose, Bld: 114 mg/dL — ABNORMAL HIGH (ref 65–99)
Potassium: 5.5 mmol/L — ABNORMAL HIGH (ref 3.5–5.3)
Sodium: 135 mmol/L (ref 135–146)
Total Bilirubin: 1.3 mg/dL — ABNORMAL HIGH (ref 0.2–1.2)
Total Protein: 7.4 g/dL (ref 6.1–8.1)

## 2019-03-17 LAB — LIPID PANEL
Cholesterol: 152 mg/dL (ref <200)
HDL: 52 mg/dL (ref >=50)
LDL Cholesterol (Calc): 76 mg/dL (calc)
Non-HDL Cholesterol (Calc): 100 mg/dL (calc) (ref <130)
Total CHOL/HDL Ratio: 2.9 (calc) (ref <5.0)
Triglycerides: 141 mg/dL (ref <150)

## 2019-03-17 LAB — TSH: TSH: 1.89 mIU/L (ref 0.40–4.50)

## 2019-03-17 NOTE — Progress Notes (Signed)
Melissa,   Please call patient and share results and comments below (she is not able to receive on MyChart at present).  Please let her know her thyroid test was good (TSH-1.89) , her lipid panel was also good and improved from 6 months ago. Remain on the current dose of her thyroid med and the statin.  Her glucose was a little high (114), consistent with prediabetes, not diabetes and was better than the last check 5 months ago (126). Continue to manage with diet.   Her kidney function is just a little decreased from last check 5 months ago, although was about the same as the results from six months ago (if needs details: Creatine was 1.23 and was 1.09 five months ago, 1.18 six months ago), and her GFR was 39. Will continue to follow. Her potassium was just a little high like it was 6 months ago (5.5 and was 5.4 six months ago). She saw her kidney doctor in September and plans were to see them again in March and she needs to keep that appointment to follow-up. If any delays, need to recheck these labs again in about a 6-8 week timeframe (by the end of March felt best).  Stay well hydrated and try to limit high potassium foods in her diet  (Some foods high in potassium are: avocados, bananas, coconut, sweet and regular potatoes, spinach, milk and lentils).  Thanks.

## 2019-03-24 ENCOUNTER — Other Ambulatory Visit: Payer: Self-pay | Admitting: Family Medicine

## 2019-03-24 NOTE — Telephone Encounter (Signed)
Requested medication (s) are due for refill today yes  Requested medication (s) are on the active medication list -yes  Future visit scheduled -yes- today  Last refill: 01/04/19  Notes to clinic: Patient has appointment today- she does meet refill requirements- needs change in PCP on Rx  Requested Prescriptions  Pending Prescriptions Disp Refills   famotidine (PEPCID) 20 MG tablet [Pharmacy Med Name: Famotidine 20 MG Oral Tablet] 60 tablet 0    Sig: Take 1 tablet by mouth twice daily      Gastroenterology:  H2 Antagonists Passed - 03/24/2019  9:35 AM      Passed - Valid encounter within last 12 months    Recent Outpatient Visits           1 week ago Essential hypertension   [facility] Towanda Malkin, MD   6 months ago Essential hypertension   [place], NP   11 months ago Venous stasis   [place], Satira Anis, MD   1 year ago Cold intolerance   [place], Satira Anis, MD   1 year ago Dysfunction of left eustachian tube   [place], NP       Future Appointments             In 5 months Towanda Malkin, MD [facility], [facility]                Requested Prescriptions  Pending Prescriptions Disp Refills   famotidine (PEPCID) 20 MG tablet [Pharmacy Med Name: Famotidine 20 MG Oral Tablet] 60 tablet 0    Sig: Take 1 tablet by mouth twice daily      Gastroenterology:  H2 Antagonists Passed - 03/24/2019  9:35 AM      Passed - Valid encounter within last 12 months    Recent Outpatient Visits           1 week ago Essential hypertension   [place], MD   6 months ago Essential hypertension   [place], NP   11 months ago Venous stasis   [place], Satira Anis, MD   1  year ago Cold intolerance   [place], MD   1 year ago Dysfunction of left eustachian tube   [place], NP       Future Appointments             In 5 months Towanda Malkin, MD [facility], [facility]

## 2019-03-28 DIAGNOSIS — L300 Nummular dermatitis: Secondary | ICD-10-CM | POA: Diagnosis not present

## 2019-03-28 DIAGNOSIS — L720 Epidermal cyst: Secondary | ICD-10-CM | POA: Diagnosis not present

## 2019-03-28 DIAGNOSIS — R600 Localized edema: Secondary | ICD-10-CM | POA: Diagnosis not present

## 2019-03-28 DIAGNOSIS — L0100 Impetigo, unspecified: Secondary | ICD-10-CM | POA: Diagnosis not present

## 2019-03-28 DIAGNOSIS — L578 Other skin changes due to chronic exposure to nonionizing radiation: Secondary | ICD-10-CM | POA: Diagnosis not present

## 2019-03-28 DIAGNOSIS — L814 Other melanin hyperpigmentation: Secondary | ICD-10-CM | POA: Diagnosis not present

## 2019-03-28 DIAGNOSIS — L570 Actinic keratosis: Secondary | ICD-10-CM | POA: Diagnosis not present

## 2019-03-28 DIAGNOSIS — Z1283 Encounter for screening for malignant neoplasm of skin: Secondary | ICD-10-CM | POA: Diagnosis not present

## 2019-03-28 DIAGNOSIS — D1800 Hemangioma unspecified site: Secondary | ICD-10-CM | POA: Diagnosis not present

## 2019-03-28 DIAGNOSIS — L853 Xerosis cutis: Secondary | ICD-10-CM | POA: Diagnosis not present

## 2019-03-29 IMAGING — US US CAROTID DUPLEX BILAT
1 series · 13 of 24 positions shown · non-contrast
Comparison: 06/25/2015

ADDENDUM:
The following is a correction to a typographical error within the
original impression.

Minimal amount of bilateral atherosclerotic plaque, right greater
than left, unchanged to minimally progressed compared to the [DATE]
examination, though again NOT resulting in a hemodynamically
significant stenosis within either internal carotid artery.
CLINICAL DATA: Carotid atherosclerosis. History of hypertension and
hyperlipidemia.
EXAM:
BILATERAL CAROTID DUPLEX ULTRASOUND
TECHNIQUE: Gray scale imaging, color Doppler and duplex ultrasound were
performed of bilateral carotid and vertebral arteries in the neck.

[Series 1: us carotid duplex bilat · 13 of 66 slices shown]
[im 1/66]
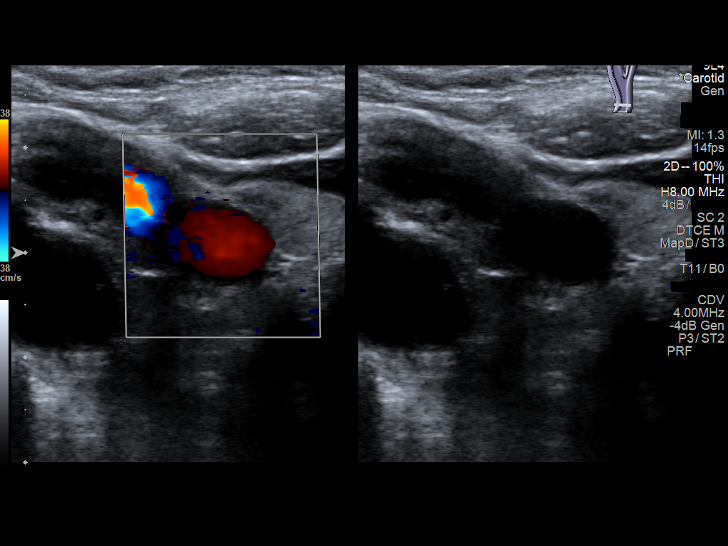
[im 6/66]
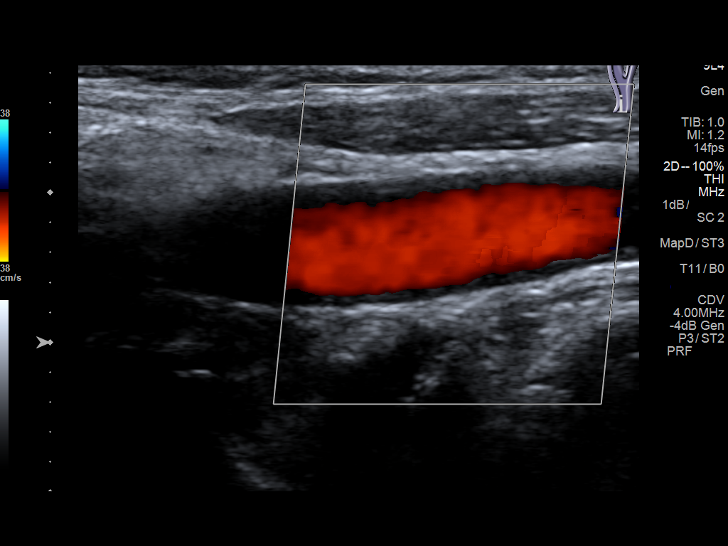
[im 12/66]
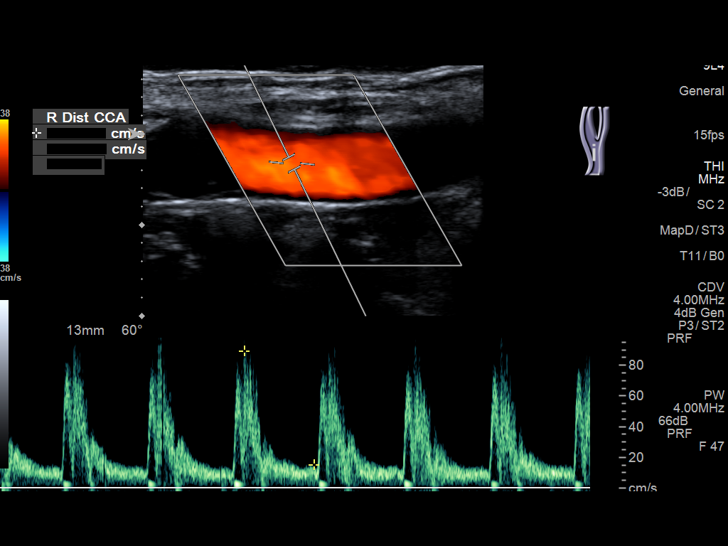
[im 17/66]
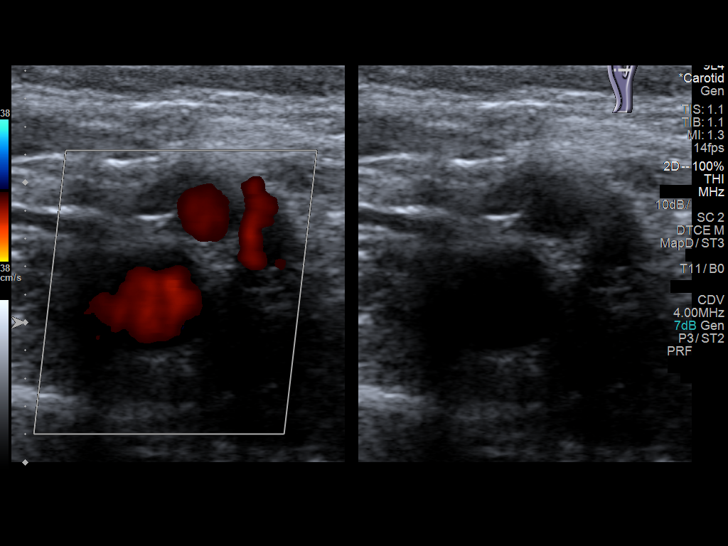
[im 23/66]
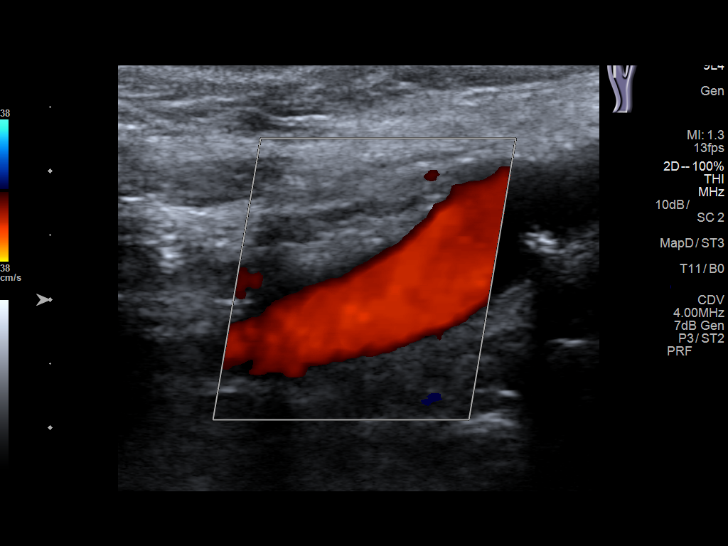
[im 29/66]
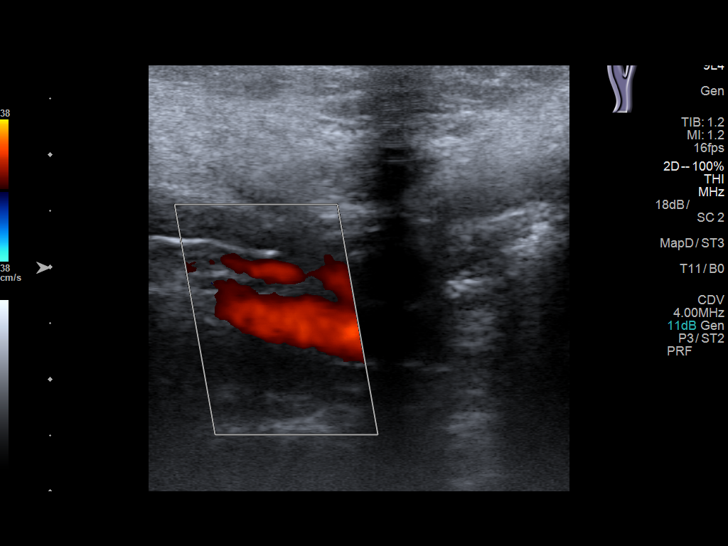
[im 34/66]
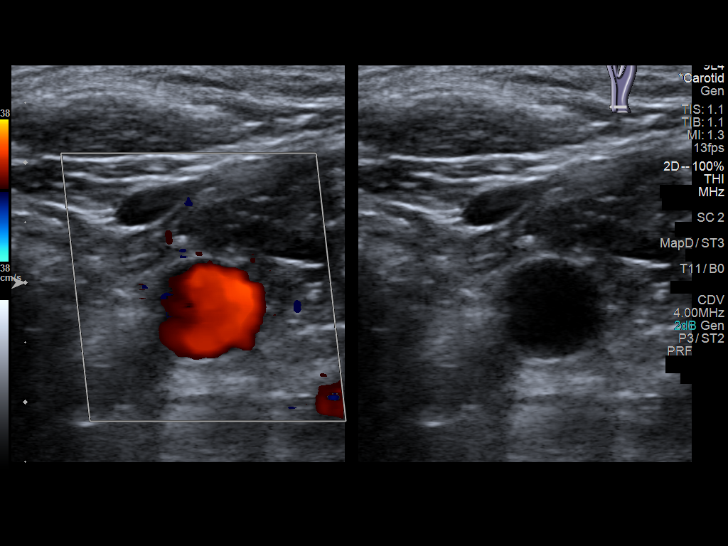
[im 37/66]
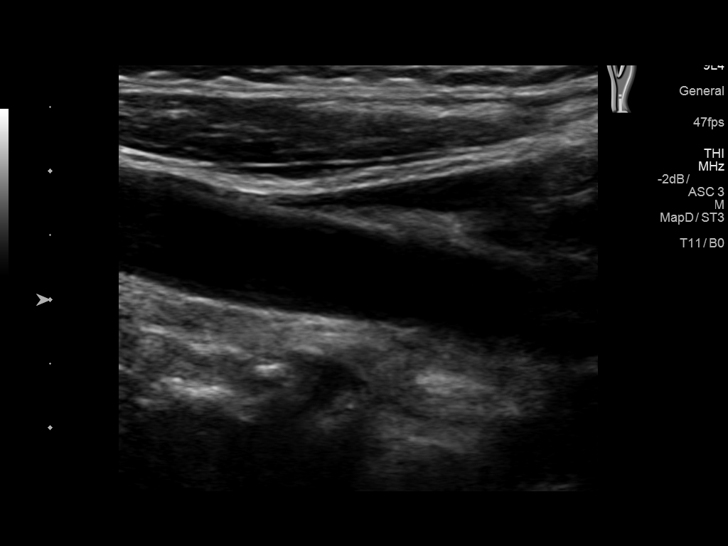
[im 43/66]
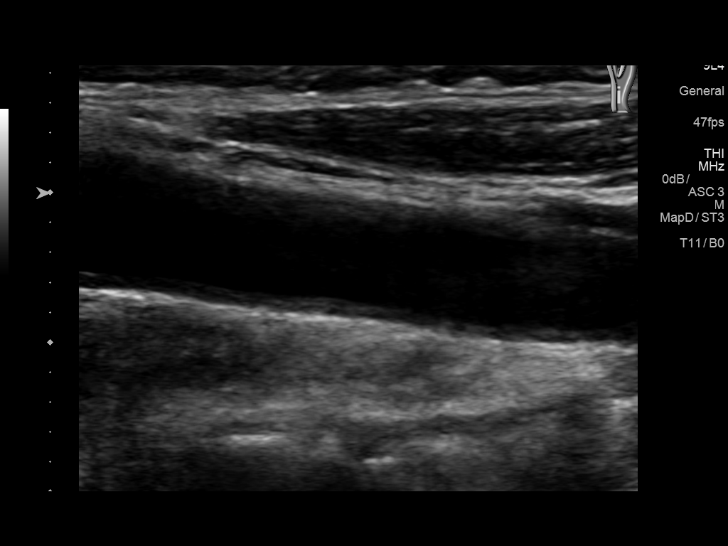
[im 49/66]
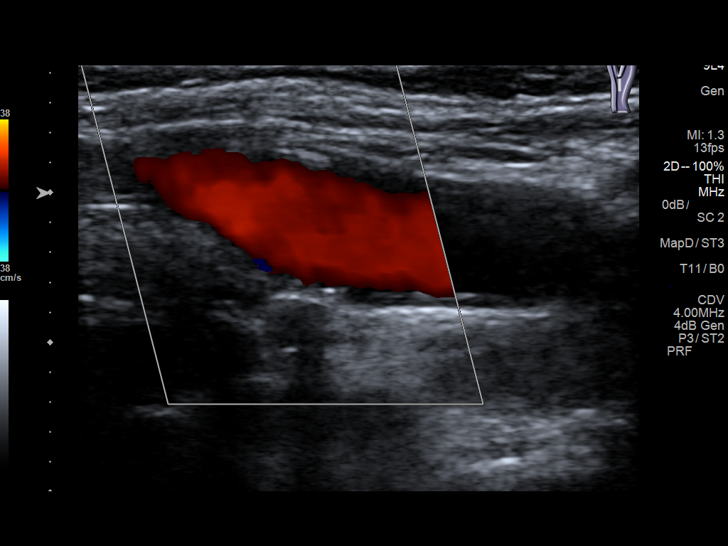
[im 54/66]
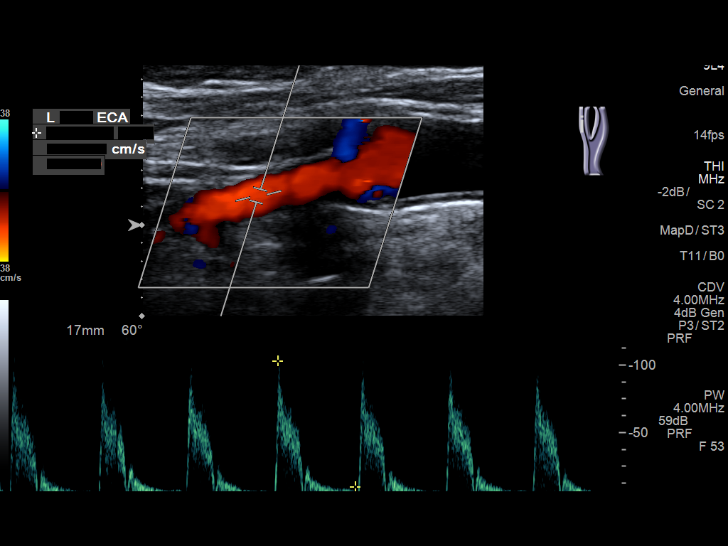
[im 60/66]
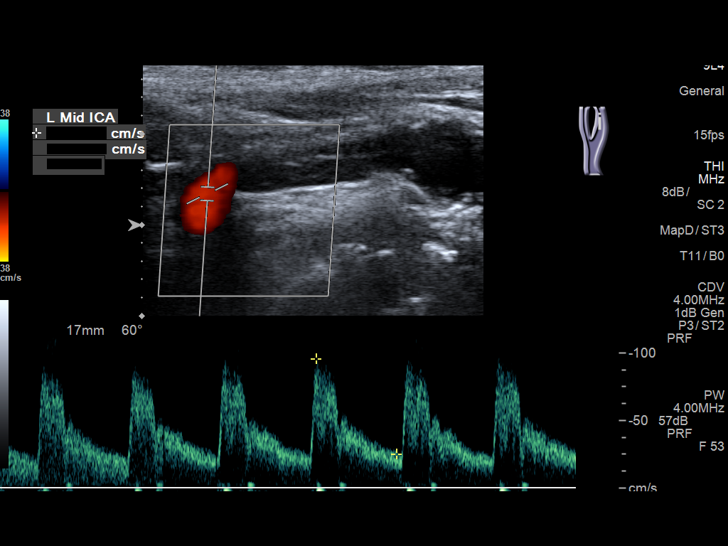
[im 66/66]
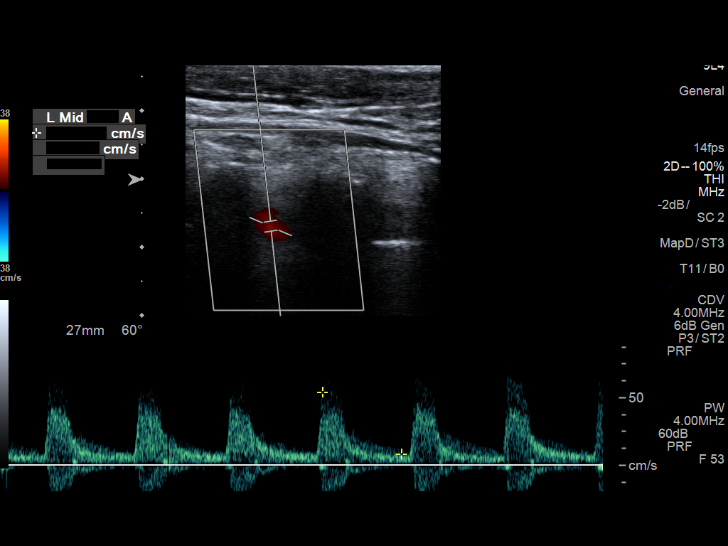

[13 of 24 positions shown; findings below may reference images not displayed]

FINDINGS: Criteria: Quantification of carotid stenosis is based on velocity
parameters that correlate the residual internal carotid diameter
with NASCET-based stenosis levels, using the diameter of the distal
internal carotid lumen as the denominator for stenosis measurement.

The following velocity measurements were obtained:

RIGHT

ICA:  82/50 cm/sec

CCA:  119/15 cm/sec

SYSTOLIC ICA/CCA RATIO:

DIASTOLIC ICA/CCA RATIO:

ECA:  1 L 5 cm/sec

LEFT

ICA:  96/25 cm/sec

CCA:  81/15 cm/sec

SYSTOLIC ICA/CCA RATIO:

DIASTOLIC ICA/CCA RATIO:

ECA:  103 cm/sec

RIGHT CAROTID ARTERY: There is a minimal amount of eccentric mixed
echogenic plaque involving the origin and proximal aspects of the
right internal carotid artery (image 25), likely progressed compared
to the [DATE] examination, though not resulting in elevated peak
systolic velocities within the interrogated course the right
internal carotid artery to suggest a hemodynamically significant
stenosis.

RIGHT VERTEBRAL ARTERY:  Antegrade flow

LEFT CAROTID ARTERY: There is a minimal amount of eccentric mixed
echogenic plaque within the left carotid bulb (image 51), similar to
the [DATE] examination, and again not resulting in elevated peak
systolic velocities within the interrogated course the left internal
carotid artery to suggest a hemodynamically significant stenosis.

LEFT VERTEBRAL ARTERY:  Antegrade flow
IMPRESSION: Minimal amount of bilateral atherosclerotic plaque, right greater
than left, unchanged to minimally progressed compared to the [DATE]
examination, though resulting in a hemodynamically significant
stenosis within either internal carotid artery.

## 2019-03-30 DIAGNOSIS — R69 Illness, unspecified: Secondary | ICD-10-CM | POA: Diagnosis not present

## 2019-04-06 ENCOUNTER — Telehealth: Payer: Self-pay

## 2019-04-06 NOTE — Telephone Encounter (Signed)
Left message with patient's pharmacy that it was ok per Dr. Roxan Hockey to change manufacture for her levothyroxine.

## 2019-04-08 ENCOUNTER — Ambulatory Visit: Payer: Medicare HMO | Attending: Internal Medicine

## 2019-04-08 DIAGNOSIS — Z23 Encounter for immunization: Secondary | ICD-10-CM | POA: Insufficient documentation

## 2019-04-08 NOTE — Progress Notes (Signed)
   Covid-19 Vaccination Clinic  Name:  Gina [name]    MRN: [id number] DOB: 13-Nov-1926  04/08/2019  [name] was observed post Covid-19 immunization for 30 minutes based on pre-vaccination screening without incidence. She was provided with Vaccine Information Sheet and instruction to access the V-Safe system.   [name] was instructed to call 911 with any severe reactions post vaccine: Marland Kitchen Difficulty breathing  . Swelling of your face and throat  . A fast heartbeat  . A bad rash all over your body  . Dizziness and weakness    Immunizations Administered    Name Date Dose VIS Date Route   Pfizer COVID-19 Vaccine 04/08/2019 10:23 AM 0.3 mL 02/03/2019 Intramuscular   Manufacturer: [place]   Lot: [id number]   [place]: [id number]

## 2019-04-21 ENCOUNTER — Other Ambulatory Visit: Payer: Self-pay | Admitting: Oncology

## 2019-04-21 DIAGNOSIS — D75839 Thrombocytosis, unspecified: Secondary | ICD-10-CM

## 2019-04-21 DIAGNOSIS — D473 Essential (hemorrhagic) thrombocythemia: Secondary | ICD-10-CM

## 2019-04-21 NOTE — Telephone Encounter (Signed)
CBC with Differential/Platelet Order: [id number] Status:  Final result  Visible to patient:  No (inaccessible in MyChart)  Next appt:  04/30/2019 at 03:15 PM in Lab ([facility]: [place])  Dx:  Thrombocytosis (H[facility])  Ref Range & Units 1 mo ago  WBC 4.0 - 10.5 K/uL 11.1High    RBC 3.87 - 5.11 MIL/uL 4.50   Hemoglobin 12.0 - 15.0 g/dL 13.7   HCT 36.0 - 46.0 % 42.1   MCV 80.0 - 100.0 fL 93.6   MCH 26.0 - 34.0 pg 30.4   MCHC 30.0 - 36.0 g/dL 32.5   RDW 11.5 - 15.5 % 14.0   Platelets 150 - 400 K/uL 573High    nRBC 0.0 - 0.2 % 0.0   Neutrophils Relative % % 71   Neutro Abs 1.7 - 7.7 K/uL 8.0High    Lymphocytes Relative % 17   Lymphs Abs 0.7 - 4.0 K/uL 1.9   Monocytes Relative % 8   Monocytes Absolute 0.1 - 1.0 K/uL 0.9   Eosinophils Relative % 2   Eosinophils Absolute 0.0 - 0.5 K/uL 0.2   Basophils Relative % 1   Basophils Absolute 0.0 - 0.1 K/uL 0.1   Immature Granulocytes % 1   Abs Immature Granulocytes 0.00 - 0.07 K/uL 0.06   Comment: Performed at [facility], [address] [place],  [address]  Resulting Agency  [facility] LAB      Specimen Collected: 03/14/19 13:57  Last Resulted: 03/14/19 14:30

## 2019-04-28 ENCOUNTER — Encounter: Payer: Self-pay | Admitting: Internal Medicine

## 2019-04-28 ENCOUNTER — Other Ambulatory Visit: Payer: Self-pay

## 2019-04-28 ENCOUNTER — Ambulatory Visit (INDEPENDENT_AMBULATORY_CARE_PROVIDER_SITE_OTHER): Payer: Medicare HMO | Admitting: Internal Medicine

## 2019-04-28 VITALS — BP 118/58 | HR 101 | Temp 97.3°F | Resp 16 | Ht 64.0 in | Wt 146.8 lb

## 2019-04-28 DIAGNOSIS — M25532 Pain in left wrist: Secondary | ICD-10-CM

## 2019-04-28 DIAGNOSIS — L03114 Cellulitis of left upper limb: Secondary | ICD-10-CM

## 2019-04-28 MED ORDER — CEPHALEXIN 500 MG PO CAPS: 500.0000 mg | ORAL_CAPSULE | Freq: Three times a day (TID) | ORAL | 0 refills | Status: AC

## 2019-04-28 NOTE — Patient Instructions (Signed)

## 2019-04-28 NOTE — Progress Notes (Signed)
Patient ID: Gina [name], female    DOB: Oct 11, 1926, 86 y.o.   MRN: [id number]  PCP: Towanda Malkin, MD  Chief Complaint  Patient presents with  . Wrist Pain    left wrist  . Joint Swelling    left wrist started one week ago    Subjective:   Gina [name] is a 86 y.o. female, presents to clinic with CC of the following:  Chief Complaint  Patient presents with  . Wrist Pain    left wrist  . Joint Swelling    left wrist started one week ago    HPI:  Patient is a 86 year old female who I last saw on 03/16/2019, with that note reviewed. Lab results from then were reviewed, with her potassium slightly high at 5.5 and your kidney function just a little decreased from 5 months prior, with follow-up with nephrology scheduled this month noted on 3/15, with plans to likely have that checked again at that time.  She follows up today with complaints of left wrist pain that she first noted about a week ago.  She denied any one-time trauma before her symptoms started.  She feels it on the palmar aspect of her left distal forearm more than over the wrist proper, with discomfort felt at this site as well as slightly up the forearm.  She feels some discomfort past the wrist towards the thumb.  She notes with wrist motions, and active thumb motions, some increased discomfort.  She is right-handed. She thinks the wrist is a little swollen compared to the opposite side, more on that distal forearm below the wrist.  She also notes some redness in this area, and increased warmth.  Some redness extends a little bit up the forearm as well.  She notes it can be itchy at times, and has been scratching the area, and actually feels some soreness up towards the elbow and upper arm.  She does not recall any bite from insect, and has had no open cuts or lesions. She denies any fevers, chills, sweats, or feeling ill. She is supposed to get her Covid vaccination again on Sunday.  Patient Active Problem  List   Diagnosis Date Noted  . DNR (do not resuscitate) 04/22/2018  . Venous stasis 04/21/2018  . Cystocele with prolapse 04/01/2017  . Hemorrhoids 07/29/2016  . Gilbert syndrome 06/30/2016  . Essential thrombocytosis ([place]) 07/23/2015  . Mild atherosclerosis of carotid artery 06/25/2015  . Dyslipidemia 06/03/2015  . Diverticulosis 05/28/2015  . GERD without esophagitis 02/06/2015  . Neuropathy 01/31/2015  . Cervical radiculopathy 01/31/2015  . Stage 3 chronic kidney disease 10/06/2014  . Renal cyst 10/06/2014  . Uterine prolapse 10/06/2014  . Cervical radiculopathy due to degenerative joint disease of spine 10/06/2014  . History of right hip replacement 10/06/2014  . Hypothyroidism 10/05/2014  . Essential hypertension 09/13/2014      Current Outpatient Medications:  .  amLODipine (NORVASC) 5 MG tablet, Take 1 tablet (5 mg total) by mouth daily., Disp: 90 tablet, Rfl: 3 .  aspirin EC 81 MG tablet, Take 81 mg by mouth daily. , Disp: , Rfl:  .  atorvastatin (LIPITOR) 20 MG tablet, TAKE 1 TABLET BY MOUTH AT BEDTIME, Disp: 90 tablet, Rfl: 2 .  Cholecalciferol (VITAMIN D3) 1000 units CAPS, Take by mouth daily. , Disp: , Rfl:  .  famotidine (PEPCID) 20 MG tablet, Take one tab once to twice daily as needed, Disp: 60 tablet, Rfl: 3 .  fluticasone (FLONASE) 50 MCG/ACT  nasal spray, , Disp: , Rfl:  .  hydroxyurea (HYDREA) 500 MG capsule, Take 500 mg (1 capsule) on Monday, Wednesday, and Friday., Disp: 30 capsule, Rfl: 5 .  irbesartan (AVAPRO) 150 MG tablet, Take 1 tablet (150 mg total) by mouth daily., Disp: 90 tablet, Rfl: 3 .  ketoconazole (NIZORAL) 2 % shampoo, Apply 1 application topically 2 (two) times a week. , Disp: , Rfl:  .  levothyroxine (SYNTHROID) 88 MCG tablet, TAKE 1 TABLET BY MOUTH ONCE DAILY BEFORE BREAKFAST, Disp: 90 tablet, Rfl: 3 .  polyethylene glycol powder (GLYCOLAX/MIRALAX) powder, Take 1 Container by mouth once., Disp: , Rfl:    Allergies  Allergen Reactions  .  Novocain [Procaine] Palpitations     Past Surgical History:  Procedure Laterality Date  . ADENOIDECTOMY    . APPENDECTOMY    . biopsy, right breast     . CATARACT EXTRACTION W/PHACO Left 03/25/2015   Procedure: CATARACT EXTRACTION PHACO AND INTRAOCULAR LENS PLACEMENT (IOC);  Surgeon: Estill Cotta, MD;  Location: [facility] ORS;  Service: Ophthalmology;  Laterality: Left;  [place] 02:09 AP% 27.3 CDE 59.97 fluid pack lot [id number]  . CESAREAN SECTION    . COLON SURGERY    . JOINT REPLACEMENT Right    total hip  . THYROID LOBECTOMY    . THYROIDECTOMY     partial  . TONSILLECTOMY    . TOTAL HIP ARTHROPLASTY    . UPPER GI ENDOSCOPY  10/17/2008     Family History  Problem Relation Age of Onset  . Stroke Mother   . Stroke Father   . Dementia Maternal Grandfather      Social History   Tobacco Use  . Smoking status: Former Research scientist (life sciences)  . Smokeless tobacco: Never Used  Substance Use Topics  . Alcohol use: No    Alcohol/week: 0.0 standard drinks    With staff assistance, above reviewed with the patient today.  ROS: As per HPI, otherwise no specific complaints on a limited and focused system review   No results found for this or any previous visit (from the past 72 hour(s)).   PHQ2/9: Depression screen [facility] 2/9 04/28/2019 03/16/2019 09/12/2018 04/21/2018 01/07/2018  Decreased Interest 1 0 0 0 0  Down, Depressed, Hopeless 1 1 0 0 0  PHQ - 2 Score 2 1 0 0 0  Altered sleeping 0 0 0 0 0  Tired, decreased energy 0 0 0 0 0  Change in appetite 0 0 0 0 0  Feeling bad or failure about yourself  0 0 0 0 0  Trouble concentrating 0 0 0 0 0  Moving slowly or fidgety/restless 0 0 0 0 0  Suicidal thoughts 0 0 0 0 0  PHQ-9 Score 2 1 0 0 0  Difficult doing work/chores Not difficult at all Not difficult at all Not difficult at all Not difficult at all Not difficult at all  Some recent data might be hidden   PHQ-2/9 Result is neg, recent sx's bothersome  Fall Risk: Fall Risk  04/28/2019 03/16/2019  09/12/2018 04/21/2018 01/07/2018  Falls in the past year? 0 0 0 0 0  Number falls in past yr: 0 0 0 - 0  Injury with Fall? 0 0 0 - 0      Objective:   Vitals:   04/28/19 1421  BP: (!) 118/58  Pulse: (!) 101  Resp: 16  Temp: (!) 97.3 F (36.3 C)  TempSrc: Temporal  SpO2: 96%  Weight: 146 lb 12.8 oz (66.6 kg)  Height: 5\' 4"  (1.626 m)    Body mass index is 25.2 kg/m.  Physical Exam   NAD, masked, not ill-appearing. HEENT - [place]/AT, sclera anicteric,  Ext -left upper extremity-her left wrist had good range of motion, minimal tenderness on the ulnar aspect to palpation, with more tenderness proximal to the wrist joint proper on the distal forearm with a small area of erythema present where tender.  Also some mild increased warmth in this area and mild swelling.  The erythema extended up to the distal third of the forearm, with no linear streak extending up towards the elbow or past the elbow joint.  She noted some discomfort more at the extremes of flexion and extension and supination and pronation of the wrist joint.  Did well with ulnar and radial different deviation.  Finkelstein's test was negative.  No focal snuffbox tenderness.  Some discomfort did extend up the extensor surface towards the base of the thumb.  No open skin lesions were evident. The elbow had good range of motion, there was no epitrochlear adenopathy (and she noted some soreness extending up the muscle beds of the elbow and proximal upper extremity at times.)  There was no axillary adenopathy palpated, no tenderness palpating the axilla. The hand had good grip and no focal tenderness over the metacarpals of the hand outside of some mild soreness at the first metacarpal, no hand swelling. Pulses were good at the wrist. Neuro/psychiatric - affect was not flat, very appropriate with conversation  Alert and oriented    Results for orders placed or performed in visit on 03/16/19  TSH  Result Value Ref Range   TSH 1.89  0.40 - 4.50 mIU/L  Lipid panel  Result Value Ref Range   Cholesterol 152 <200 mg/dL   HDL 52 > OR = 50 mg/dL   Triglycerides 141 <150 mg/dL   LDL Cholesterol (Calc) 76 mg/dL (calc)   Total CHOL/HDL Ratio 2.9 <5.0 (calc)   Non-HDL Cholesterol (Calc) 100 <130 mg/dL (calc)  COMPLETE METABOLIC PANEL WITH GFR  Result Value Ref Range   Glucose, Bld 114 (H) 65 - 99 mg/dL   BUN 26 (H) 7 - 25 mg/dL   Creat 1.23 (H) 0.60 - 0.88 mg/dL   GFR, Est Non African American 39 (L) > OR = 60 mL/min/1.25m2   GFR, Est African American 45 (L) > OR = 60 mL/min/1.12m2   BUN/Creatinine Ratio 21 6 - 22 (calc)   Sodium 135 135 - 146 mmol/L   Potassium 5.5 (H) 3.5 - 5.3 mmol/L   Chloride 100 98 - 110 mmol/L   CO2 22 20 - 32 mmol/L   Calcium 10.4 8.6 - 10.4 mg/dL   Total Protein 7.4 6.1 - 8.1 g/dL   Albumin 4.6 3.6 - 5.1 g/dL   Globulin 2.8 1.9 - 3.7 g/dL (calc)   AG Ratio 1.6 1.0 - 2.5 (calc)   Total Bilirubin 1.3 (H) 0.2 - 1.2 mg/dL   Alkaline phosphatase (APISO) 87 37 - 153 U/L   AST 26 10 - 35 U/L   ALT 25 6 - 29 U/L       Assessment & Plan:   1. Cellulitis of left upper extremity Do feel there are concerns for a cellulitis of the distal left upper extremity, and she has been scratching at it at times.  No lymphangitic streaks at this point, no fevers or systemic symptoms, and warned of signs to watch for in the very near future.  Do feel adding an antibiotic  is appropriate with Keflex prescribed She has been applying heat, and recommended not applying heat, and if anything can try some cold to help with the mild swelling at the wrist area.  Would continue Tylenol products at this point 3-4 times a day to help with the discomfort.  Discussed the possible Ace wrap for support of the wrist when out and about also can be tried, but not to keep covered routinely.  Also emphasized rest, limiting activities with her left wrist in the short-term is important until symptoms improve. - cephALEXin (KEFLEX) 500 MG  capsule; Take 1 capsule (500 mg total) by mouth 3 (three) times daily for 7 days.  Dispense: 21 capsule; Refill: 0  2. Left wrist pain May have an element of a mild wrist sprain, like a de Quervain's tenosynovitis, and rest, cold, and Tylenol products can help in the management.  Emphasized following up if not improving or more problematic, and if develops any fevers, feeling more ill, red streaks going up the arm towards the elbow, she needs to be seen immediately in an emergent setting and she was understanding of that (and noted that that scared her, although I did want her to be aware of the importance of taking these recommendations).       Towanda Malkin, MD 04/28/19 2:56 PM

## 2019-04-30 ENCOUNTER — Ambulatory Visit: Payer: Medicare HMO | Attending: Internal Medicine

## 2019-04-30 DIAGNOSIS — Z23 Encounter for immunization: Secondary | ICD-10-CM | POA: Insufficient documentation

## 2019-04-30 NOTE — Progress Notes (Signed)
   Covid-19 Vaccination Clinic  Name:  Gina [name]    MRN: [id number] DOB: Oct 11, 1926  04/30/2019  Gina [name] was observed post Covid-19 immunization for 30 minutes based on pre-vaccination screening without incident. She was provided with Vaccine Information Sheet and instruction to access the V-Safe system.   Gina [name] was instructed to call 911 with any severe reactions post vaccine: Marland Kitchen Difficulty breathing  . Swelling of face and throat  . A fast heartbeat  . A bad rash all over body  . Dizziness and weakness   Immunizations Administered    Name Date Dose VIS Date Route   Pfizer COVID-19 Vaccine 04/30/2019  3:16 PM 0.3 mL 02/03/2019 Intramuscular   Manufacturer: [place]   Lot: [id number]   [place]: [id number]

## 2019-05-01 DIAGNOSIS — Z9889 Other specified postprocedural states: Secondary | ICD-10-CM | POA: Diagnosis not present

## 2019-05-01 DIAGNOSIS — R928 Other abnormal and inconclusive findings on diagnostic imaging of breast: Secondary | ICD-10-CM | POA: Diagnosis not present

## 2019-05-01 DIAGNOSIS — R921 Mammographic calcification found on diagnostic imaging of breast: Secondary | ICD-10-CM | POA: Diagnosis not present

## 2019-05-02 NOTE — Progress Notes (Signed)
Patient ID: Gina [name], female    DOB: 10/25/1926, 86 y.o.   MRN: [id number]  PCP: Towanda Malkin, MD  Chief Complaint  Patient presents with  . Wrist Pain    Subjective:   Gina [name] is a 86 y.o. female, presents to clinic with CC of the following:  Chief Complaint  Patient presents with  . Wrist Pain    HPI:  Patient is a 86 y.o. female who was seen 04/28/2019 with concerns for cellulitis and a possible left wrist sprain with that note reviewed Last visit, felt there were concerns for a cellulitis of the distal left upper ext and Keflex was prescribed. Also Tylenol products 3-4 times a day to help with the discomfort and using a possible Ace wrap for support of the wrist when out and about, but not to keep covered routinely.  Also emphasized rest, limiting activities with her left wrist in the short-term. She returns today with noting that the wrist is feeling a little better, and no progression of infectious concerns, and she is still taking the Keflex.  She notes she is bothered still by some numbness in the hands which she brought up previously, but more bothered by achiness down from her left shoulder down her left arm and a burning pain that she feels from the neck down over the left shoulder.  This is been more problematic over the last couple weeks.  She denies dropping things, and it is the left side not the right where she has the symptoms. Still has some soreness at the wrist, felt beneath the thumb region and extending up past the wrist towards the lower forearm.  (Like a de Quervain's tenosynovitis that was noted last visit).  No increased arm redness, no streaks developed up the arm, and still without fevers or any systemic concerns for infection  Patient Active Problem List   Diagnosis Date Noted  . DNR (do not resuscitate) 04/22/2018  . Venous stasis 04/21/2018  . Cystocele with prolapse 04/01/2017  . Hemorrhoids 07/29/2016  . Gilbert syndrome  06/30/2016  . Essential thrombocytosis ([place]) 07/23/2015  . Mild atherosclerosis of carotid artery 06/25/2015  . Dyslipidemia 06/03/2015  . Diverticulosis 05/28/2015  . GERD without esophagitis 02/06/2015  . Neuropathy 01/31/2015  . Cervical radiculopathy 01/31/2015  . Stage 3 chronic kidney disease 10/06/2014  . Renal cyst 10/06/2014  . Uterine prolapse 10/06/2014  . Cervical radiculopathy due to degenerative joint disease of spine 10/06/2014  . History of right hip replacement 10/06/2014  . Hypothyroidism 10/05/2014  . Essential hypertension 09/13/2014      Current Outpatient Medications:  .  Acetaminophen 500 MG coapsule, Take by mouth every 4 (four) hours as needed for fever., Disp: , Rfl:  .  amLODipine (NORVASC) 5 MG tablet, Take 1 tablet (5 mg total) by mouth daily., Disp: 90 tablet, Rfl: 3 .  aspirin EC 81 MG tablet, Take 81 mg by mouth daily. , Disp: , Rfl:  .  atorvastatin (LIPITOR) 20 MG tablet, TAKE 1 TABLET BY MOUTH AT BEDTIME, Disp: 90 tablet, Rfl: 2 .  cephALEXin (KEFLEX) 500 MG capsule, Take 1 capsule (500 mg total) by mouth 3 (three) times daily for 7 days., Disp: 21 capsule, Rfl: 0 .  Cholecalciferol (VITAMIN D3) 1000 units CAPS, Take by mouth daily. , Disp: , Rfl:  .  famotidine (PEPCID) 20 MG tablet, Take one tab once to twice daily as needed, Disp: 60 tablet, Rfl: 3 .  fluticasone (FLONASE) 50 MCG/ACT  nasal spray, , Disp: , Rfl:  .  hydroxyurea (HYDREA) 500 MG capsule, Take 500 mg (1 capsule) on Monday, Wednesday, and Friday., Disp: 30 capsule, Rfl: 5 .  irbesartan (AVAPRO) 150 MG tablet, Take 1 tablet (150 mg total) by mouth daily., Disp: 90 tablet, Rfl: 3 .  ketoconazole (NIZORAL) 2 % shampoo, Apply 1 application topically 2 (two) times a week. , Disp: , Rfl:  .  levothyroxine (SYNTHROID) 88 MCG tablet, TAKE 1 TABLET BY MOUTH ONCE DAILY BEFORE BREAKFAST, Disp: 90 tablet, Rfl: 3 .  polyethylene glycol powder (GLYCOLAX/MIRALAX) powder, Take 1 Container by mouth  once., Disp: , Rfl:    Allergies  Allergen Reactions  . Novocain [Procaine] Palpitations     Past Surgical History:  Procedure Laterality Date  . ADENOIDECTOMY    . APPENDECTOMY    . biopsy, right breast     . CATARACT EXTRACTION W/PHACO Left 03/25/2015   Procedure: CATARACT EXTRACTION PHACO AND INTRAOCULAR LENS PLACEMENT (IOC);  Surgeon: Estill Cotta, MD;  Location: [facility] ORS;  Service: Ophthalmology;  Laterality: Left;  [place] 02:09 AP% 27.3 CDE 59.97 fluid pack lot [id number]  . CESAREAN SECTION    . COLON SURGERY    . JOINT REPLACEMENT Right    total hip  . THYROID LOBECTOMY    . THYROIDECTOMY     partial  . TONSILLECTOMY    . TOTAL HIP ARTHROPLASTY    . UPPER GI ENDOSCOPY  10/17/2008     Family History  Problem Relation Age of Onset  . Stroke Mother   . Stroke Father   . Dementia Maternal Grandfather      Social History   Tobacco Use  . Smoking status: Former Research scientist (life sciences)  . Smokeless tobacco: Never Used  Substance Use Topics  . Alcohol use: No    Alcohol/week: 0.0 standard drinks    With staff assistance, above reviewed with the patient today.  ROS: As per HPI, otherwise no specific complaints on a limited and focused system review   No results found for this or any previous visit (from the past 72 hour(s)).   PHQ2/9: Depression screen [facility] 2/9 05/03/2019 04/28/2019 03/16/2019 09/12/2018 04/21/2018  Decreased Interest 1 1 0 0 0  Down, Depressed, Hopeless 1 1 1  0 0  PHQ - 2 Score 2 2 1  0 0  Altered sleeping 0 0 0 0 0  Tired, decreased energy 0 0 0 0 0  Change in appetite 0 0 0 0 0  Feeling bad or failure about yourself  0 0 0 0 0  Trouble concentrating 0 0 0 0 0  Moving slowly or fidgety/restless 0 0 0 0 0  Suicidal thoughts 0 0 0 0 0  PHQ-9 Score 2 2 1  0 0  Difficult doing work/chores Not difficult at all Not difficult at all Not difficult at all Not difficult at all Not difficult at all  Some recent data might be hidden   PHQ-2/9 Result is as prior,  frustrated with wrist sx's    Fall Risk: Fall Risk  05/03/2019 04/28/2019 03/16/2019 09/12/2018 04/21/2018  Falls in the past year? 0 0 0 0 0  Number falls in past yr: 0 0 0 0 -  Injury with Fall? 0 0 0 0 -      Objective:   Vitals:   05/03/19 1305  BP: 136/70  Pulse: 96  Resp: 16  Temp: (!) 97.5 F (36.4 C)  TempSrc: Temporal  SpO2: 94%  Weight: 143 lb 12.8 oz (65.2  kg)  Height: 5\' 4"  (1.626 m)    Body mass index is 24.68 kg/m.  Physical Exam   NAD, masked HEENT - [place]/AT, sclera anicteric,  Neck - supple, adequate range of motion without rigidity, not markedly tender with palpation over the cervical spine, although notes occasional feels pain in this area and more left sided,  Left shoulder/elbow -adequate motion, notes more of an achy/soreness over the shoulder and down the arms and no focal tenderness at the shoulder or elbow joints. left wrist - had good range of motion, some mild tenderness persists with palpation more on the radial aspect of the wrist joint and extending slightly up the forearm, some faint erythema persists although improved, and no increased warmth.  Still question some subtle swelling in this region, although not marked. No focal snuffbox tenderness.  Some discomfort persists extending up the extensor surface towards the base of the thumb.  No open skin lesions The hands had good grip bilat Pulses were good at the wrist. Neuro/psychiatric - affect was not flat, appropriate with conversation  Alert and oriented  Grossly non-focal - sensation intact to LT in distal upper extremities including the hand  Speech normal  Results for orders placed or performed in visit on 03/16/19  TSH  Result Value Ref Range   TSH 1.89 0.40 - 4.50 mIU/L  Lipid panel  Result Value Ref Range   Cholesterol 152 <200 mg/dL   HDL 52 > OR = 50 mg/dL   Triglycerides 141 <150 mg/dL   LDL Cholesterol (Calc) 76 mg/dL (calc)   Total CHOL/HDL Ratio 2.9 <5.0 (calc)   Non-HDL  Cholesterol (Calc) 100 <130 mg/dL (calc)  COMPLETE METABOLIC PANEL WITH GFR  Result Value Ref Range   Glucose, Bld 114 (H) 65 - 99 mg/dL   BUN 26 (H) 7 - 25 mg/dL   Creat 1.23 (H) 0.60 - 0.88 mg/dL   GFR, Est Non African American 39 (L) > OR = 60 mL/min/1.62m2   GFR, Est African American 45 (L) > OR = 60 mL/min/1.72m2   BUN/Creatinine Ratio 21 6 - 22 (calc)   Sodium 135 135 - 146 mmol/L   Potassium 5.5 (H) 3.5 - 5.3 mmol/L   Chloride 100 98 - 110 mmol/L   CO2 22 20 - 32 mmol/L   Calcium 10.4 8.6 - 10.4 mg/dL   Total Protein 7.4 6.1 - 8.1 g/dL   Albumin 4.6 3.6 - 5.1 g/dL   Globulin 2.8 1.9 - 3.7 g/dL (calc)   AG Ratio 1.6 1.0 - 2.5 (calc)   Total Bilirubin 1.3 (H) 0.2 - 1.2 mg/dL   Alkaline phosphatase (APISO) 87 37 - 153 U/L   AST 26 10 - 35 U/L   ALT 25 6 - 29 U/L       Assessment & Plan:   1. Hand numbness Has noted some distal numbness in both hands in the past, although more recently having the radicular concerns down the left upper extremity. As noted below - DG Cervical Spine Complete; Future - predniSONE (DELTASONE) 10 MG tablet; Take three tabs daily x 2 days, then two tabs daily X 2 days, then one tab daily x 2 days  Dispense: 12 tablet; Refill: 0  2. Pain and numbness of left upper extremity Concern with the radicular symptoms, and do feel getting an x-ray of the cervical spine is indicated.  Also, a very brief course of a steroid would likely be helpful and did review side effects and concerns of this, with  keeping it a very short course and a low dose helpful to minimize that. We will see how she responds to this and await the x-ray result - DG Cervical Spine Complete; Future - predniSONE (DELTASONE) 10 MG tablet; Take three tabs daily x 2 days, then two tabs daily X 2 days, then one tab daily x 2 days  Dispense: 12 tablet; Refill: 0  3. Cellulitis of left upper extremity This looks much better today, and will finish the Keflex course.  Is improved  4. De  Quervain's tenosynovitis, left Do feel she has an element of this as well and educated on this.  We will hold off on a formal splint presently it seems to be getting a little better, and have some concerns with compression in this area where she feels the discomfort causing more discomfort.  We will continue with the modalities discussed last visit to help, and await her response.  Tentatively, felt best to have some follow-up, and will follow up in about 4 weeks, sooner as needed.  Also she will be notified of the x-ray result when it returns.         Towanda Malkin, MD 05/03/19 1:17 PM

## 2019-05-03 ENCOUNTER — Ambulatory Visit
Admission: RE | Admit: 2019-05-03 | Discharge: 2019-05-03 | Disposition: A | Discharge status: Home / Self Care | Payer: Medicare HMO | Source: Ambulatory Visit | Attending: Internal Medicine | Admitting: Internal Medicine

## 2019-05-03 ENCOUNTER — Encounter: Payer: Self-pay | Admitting: Internal Medicine

## 2019-05-03 ENCOUNTER — Ambulatory Visit
Admission: RE | Admit: 2019-05-03 | Discharge: 2019-05-03 | Disposition: A | Discharge status: Home / Self Care | Payer: Medicare HMO | Source: Ambulatory Visit | Attending: Physician Assistant | Admitting: Physician Assistant

## 2019-05-03 ENCOUNTER — Ambulatory Visit (INDEPENDENT_AMBULATORY_CARE_PROVIDER_SITE_OTHER): Payer: Medicare HMO | Admitting: Internal Medicine

## 2019-05-03 ENCOUNTER — Other Ambulatory Visit: Payer: Self-pay

## 2019-05-03 VITALS — BP 136/70 | HR 96 | Temp 97.5°F | Resp 16 | Ht 64.0 in | Wt 143.8 lb

## 2019-05-03 DIAGNOSIS — M79602 Pain in left arm: Secondary | ICD-10-CM | POA: Insufficient documentation

## 2019-05-03 DIAGNOSIS — M4802 Spinal stenosis, cervical region: Secondary | ICD-10-CM | POA: Diagnosis not present

## 2019-05-03 DIAGNOSIS — R200 Anesthesia of skin: Secondary | ICD-10-CM

## 2019-05-03 DIAGNOSIS — L03114 Cellulitis of left upper limb: Secondary | ICD-10-CM | POA: Diagnosis not present

## 2019-05-03 DIAGNOSIS — M654 Radial styloid tenosynovitis [de Quervain]: Secondary | ICD-10-CM

## 2019-05-03 MED ORDER — PREDNISONE 10 MG PO TABS: ORAL_TABLET | ORAL | 0 refills | Status: DC

## 2019-05-03 NOTE — Patient Instructions (Signed)
Please obtain neck x-ray today

## 2019-05-04 NOTE — Progress Notes (Signed)
Pease let Gina [name] know that I reviewed her c-spine films. They showed significant Disc Space Narrowing at several levels, more severe at C4-5 and C5-6, and moderate at C3-4 and C6-7.  She also has some narrowing where the nerves exit at several levels, and some degenerative changes which often occur with aging. I do feel these changes could be the source of her symptoms of the pains down the shoulder and arm and some numbness noted distally, and anxious to see how she responds to the short course of prednisone.  If is helped by this, but symptoms persist or return, may consider a medicine like gabapentin (Neurontin) to help as we discussed during her visit. Tentatively, we scheduled a follow-up for 4 weeks after our visit yesterday, but can return sooner as needed pending her response. Thanks, [facility]

## 2019-05-08 DIAGNOSIS — E875 Hyperkalemia: Secondary | ICD-10-CM | POA: Diagnosis not present

## 2019-05-08 DIAGNOSIS — N1832 Chronic kidney disease, stage 3b: Secondary | ICD-10-CM | POA: Diagnosis not present

## 2019-05-09 ENCOUNTER — Telehealth: Payer: Self-pay

## 2019-05-09 NOTE — Telephone Encounter (Signed)
Patient called, reports not being able to sleep last night due to sharp pain in her wrist and thumb. The pain is off and on, sharp and shooting. Patient has appointment scheduled with you tomorrow at 1100. She would like to know what she can do until then to help alleviate the pain. She has tried tylenol with no relief.

## 2019-05-09 NOTE — Telephone Encounter (Signed)
I spoke with the patient and she does not like to take ibuprofen, it upsets her stomach. Patient will wrap her wrist and rest until her visit tomorrow at 8. Patient did mention she is interested in a referral for her wrist.

## 2019-05-09 NOTE — Telephone Encounter (Signed)
Melissa, Please let patient know she can try to take ibuprofen 2 tablets with food alternating with the Tylenol every 4 hours today to try to help. Also can try to apply an Ace wrap to her wrist to keep it relatively neutral and rested to try to help as well. Thanks, [facility]

## 2019-05-10 ENCOUNTER — Other Ambulatory Visit: Payer: Self-pay

## 2019-05-10 ENCOUNTER — Ambulatory Visit
Admission: RE | Admit: 2019-05-10 | Discharge: 2019-05-10 | Disposition: A | Discharge status: Home / Self Care | Payer: Medicare HMO | Source: Ambulatory Visit | Attending: Internal Medicine | Admitting: Internal Medicine

## 2019-05-10 ENCOUNTER — Encounter: Payer: Self-pay | Admitting: Internal Medicine

## 2019-05-10 ENCOUNTER — Ambulatory Visit
Admission: RE | Admit: 2019-05-10 | Discharge: 2019-05-10 | Disposition: A | Discharge status: Home / Self Care | Payer: Medicare HMO | Attending: Internal Medicine | Admitting: Internal Medicine

## 2019-05-10 ENCOUNTER — Ambulatory Visit (INDEPENDENT_AMBULATORY_CARE_PROVIDER_SITE_OTHER): Payer: Medicare HMO | Admitting: Internal Medicine

## 2019-05-10 VITALS — BP 122/72 | HR 97 | Temp 96.8°F | Resp 16 | Ht 64.0 in | Wt 140.0 lb

## 2019-05-10 DIAGNOSIS — M25532 Pain in left wrist: Secondary | ICD-10-CM | POA: Diagnosis not present

## 2019-05-10 DIAGNOSIS — M5030 Other cervical disc degeneration, unspecified cervical region: Secondary | ICD-10-CM | POA: Diagnosis not present

## 2019-05-10 DIAGNOSIS — M19032 Primary osteoarthritis, left wrist: Secondary | ICD-10-CM | POA: Diagnosis not present

## 2019-05-10 DIAGNOSIS — R200 Anesthesia of skin: Secondary | ICD-10-CM | POA: Diagnosis not present

## 2019-05-10 DIAGNOSIS — M654 Radial styloid tenosynovitis [de Quervain]: Secondary | ICD-10-CM | POA: Diagnosis not present

## 2019-05-10 NOTE — Patient Instructions (Signed)
Please get wrist x-ray today.  Please get a soft wrist restraint to help over the next week, and if symptoms are better, can start to wean away from using.

## 2019-05-10 NOTE — Progress Notes (Signed)
Patient ID: Gina [name], female    DOB: 03-Apr-1926, 86 y.o.   MRN: [id number]  PCP: Towanda Malkin, MD  Chief Complaint  Patient presents with  . Wrist Pain    Subjective:   Gina [name] is a 86 y.o. female, presents to clinic with CC of the following:  Chief Complaint  Patient presents with  . Wrist Pain    HPI:  Patient is a 86 year old female who I last saw at 05/03/2019, and on that visit she noted the wrist was a little better, with more complaints of discomfort from the neck over the shoulders and down the arms and hand numbness.  She also was felt to have an element of de Quervain's tenosynovitis at that point.  C-spine films were obtained with those results reviewed with the patient, and a brief course of prednisone was initiated.  The concerns for a prior cellulitis component from her initial presentation with wrist discomfort on 04/28/2019 was improved. She returns today noting her wrist is again more painful.  She called yesterday, with some recommendations given, and she noted that by wrapping it yesterday, it feels much better today.  She feels the pain in the forearm above the wrist along the radial aspect, and radiates across the wrist down into her thumb area.  Can be more problematic at times with thumb movements.  There was no further trauma that occurred, the wrist has not swelled, and she notes a bony prominence on that side of the wrist that she was inquiring about as it seemed to be much more prominent versus the other wrist. Still has some numbness in her hands bilaterally, and did review the cervical spine film results with her today.  Noted she has degenerative changes in the cervical spine, which may be contributing to her hand numbness issues. She did follow-up with nephrology, and he did check labs although I cannot find them in epic.  He noted her sodium was a little low, but did not note that her potassium was a problem.  She asked about what she could  drink to get her sodium up.    1. Hand numbness Has noted some distal numbness in both hands in the past, although more recently having the radicular concerns down the left upper extremity. As noted below - DG Cervical Spine Complete; Future - predniSONE (DELTASONE) 10 MG tablet; Take three tabs daily x 2 days, then two tabs daily X 2 days, then one tab daily x 2 days  Dispense: 12 tablet; Refill: 0  2. Pain and numbness of left upper extremity Concern with the radicular symptoms, and do feel getting an x-ray of the cervical spine is indicated.  Also, a very brief course of a steroid would likely be helpful and did review side effects and concerns of this, with keeping it a very short course and a low dose helpful to minimize that. We will see how she responds to this and await the x-ray result - DG Cervical Spine Complete; Future - predniSONE (DELTASONE) 10 MG tablet; Take three tabs daily x 2 days, then two tabs daily X 2 days, then one tab daily x 2 days  Dispense: 12 tablet; Refill: 0  3. Cellulitis of left upper extremity This looks much better today, and will finish the Keflex course.  Is improved  4. De Quervain's tenosynovitis, left Do feel she has an element of this as well and educated on this.  We will hold off on a formal splint  presently it seems to be getting a little better, and have some concerns with compression in this area where she feels the discomfort causing more discomfort.  We will continue with the modalities discussed last visit to help, and await her response.  Tentatively, felt best to have some follow-up, and will follow up in about 4 weeks, sooner as needed.  Also she will be notified of the x-ray result when it returns  Patient Active Problem List   Diagnosis Date Noted  . DNR (do not resuscitate) 04/22/2018  . Venous stasis 04/21/2018  . Cystocele with prolapse 04/01/2017  . Hemorrhoids 07/29/2016  . Gilbert syndrome 06/30/2016  . Essential  thrombocytosis ([place]) 07/23/2015  . Mild atherosclerosis of carotid artery 06/25/2015  . Dyslipidemia 06/03/2015  . Diverticulosis 05/28/2015  . GERD without esophagitis 02/06/2015  . Neuropathy 01/31/2015  . Cervical radiculopathy 01/31/2015  . Stage 3 chronic kidney disease 10/06/2014  . Renal cyst 10/06/2014  . Uterine prolapse 10/06/2014  . Cervical radiculopathy due to degenerative joint disease of spine 10/06/2014  . History of right hip replacement 10/06/2014  . Hypothyroidism 10/05/2014  . Essential hypertension 09/13/2014      Current Outpatient Medications:  .  Acetaminophen 500 MG coapsule, Take by mouth every 4 (four) hours as needed for fever., Disp: , Rfl:  .  amLODipine (NORVASC) 5 MG tablet, Take 1 tablet (5 mg total) by mouth daily., Disp: 90 tablet, Rfl: 3 .  aspirin EC 81 MG tablet, Take 81 mg by mouth daily. , Disp: , Rfl:  .  atorvastatin (LIPITOR) 20 MG tablet, TAKE 1 TABLET BY MOUTH AT BEDTIME, Disp: 90 tablet, Rfl: 2 .  Cholecalciferol (VITAMIN D3) 1000 units CAPS, Take by mouth daily. , Disp: , Rfl:  .  famotidine (PEPCID) 20 MG tablet, Take one tab once to twice daily as needed, Disp: 60 tablet, Rfl: 3 .  fluticasone (FLONASE) 50 MCG/ACT nasal spray, , Disp: , Rfl:  .  hydroxyurea (HYDREA) 500 MG capsule, Take 500 mg (1 capsule) on Monday, Wednesday, and Friday., Disp: 30 capsule, Rfl: 5 .  irbesartan (AVAPRO) 150 MG tablet, Take 1 tablet (150 mg total) by mouth daily., Disp: 90 tablet, Rfl: 3 .  ketoconazole (NIZORAL) 2 % shampoo, Apply 1 application topically 2 (two) times a week. , Disp: , Rfl:  .  levothyroxine (SYNTHROID) 88 MCG tablet, TAKE 1 TABLET BY MOUTH ONCE DAILY BEFORE BREAKFAST, Disp: 90 tablet, Rfl: 3 .  polyethylene glycol powder (GLYCOLAX/MIRALAX) powder, Take 1 Container by mouth once., Disp: , Rfl:    Allergies  Allergen Reactions  . Novocain [Procaine] Palpitations     Past Surgical History:  Procedure Laterality Date  .  ADENOIDECTOMY    . APPENDECTOMY    . biopsy, right breast     . CATARACT EXTRACTION W/PHACO Left 03/25/2015   Procedure: CATARACT EXTRACTION PHACO AND INTRAOCULAR LENS PLACEMENT (IOC);  Surgeon: Estill Cotta, MD;  Location: [facility] ORS;  Service: Ophthalmology;  Laterality: Left;  [place] 02:09 AP% 27.3 CDE 59.97 fluid pack lot [id number]  . CESAREAN SECTION    . COLON SURGERY    . JOINT REPLACEMENT Right    total hip  . THYROID LOBECTOMY    . THYROIDECTOMY     partial  . TONSILLECTOMY    . TOTAL HIP ARTHROPLASTY    . UPPER GI ENDOSCOPY  10/17/2008     Family History  Problem Relation Age of Onset  . Stroke Mother   . Stroke Father   .  Dementia Maternal Grandfather      Social History   Tobacco Use  . Smoking status: Former Research scientist (life sciences)  . Smokeless tobacco: Never Used  Substance Use Topics  . Alcohol use: No    Alcohol/week: 0.0 standard drinks    With staff assistance, above reviewed with the patient/caregiver today.  ROS: As per HPI, otherwise no specific complaints on a limited and focused system review   No results found for this or any previous visit (from the past 72 hour(s)).   PHQ2/9: Depression screen [facility] 2/9 05/10/2019 05/03/2019 04/28/2019 03/16/2019 09/12/2018  Decreased Interest 1 1 1  0 0  Down, Depressed, Hopeless 1 1 1 1  0  PHQ - 2 Score 2 2 2 1  0  Altered sleeping 0 0 0 0 0  Tired, decreased energy 0 0 0 0 0  Change in appetite 0 0 0 0 0  Feeling bad or failure about yourself  0 0 0 0 0  Trouble concentrating 0 0 0 0 0  Moving slowly or fidgety/restless 0 0 0 0 0  Suicidal thoughts 0 0 0 0 0  PHQ-9 Score 2 2 2 1  0  Difficult doing work/chores Not difficult at all Not difficult at all Not difficult at all Not difficult at all Not difficult at all  Some recent data might be hidden   PHQ-2/9 Result reviewed   Fall Risk: Fall Risk  05/10/2019 05/03/2019 04/28/2019 03/16/2019 09/12/2018  Falls in the past year? 0 0 0 0 0  Number falls in past yr: 0 0 0 0 0    Injury with Fall? 0 0 0 0 0      Objective:   Vitals:   05/10/19 1107  BP: 122/72  Pulse: 97  Resp: 16  Temp: (!) 96.8 F (36 C)  TempSrc: Temporal  SpO2: 98%  Weight: 140 lb (63.5 kg)  Height: 5\' 4"  (1.626 m)    Body mass index is 24.03 kg/m.  Physical Exam   NAD, masked, pleasant HEENT - [place]/AT, sclera anicteric, Ext - left Wrist - ROM: not limited:   flexion and extension not limited, no pain (felt a "pulling" with extension)   Supination and pronation not limited, not painful    Ulnar and radial deviation not limited, some min discomfort with testing and Finklestein's test positive today, with mild discomfort with this.  (Not severe)  No swelling, a more pronounced bony protuberance was present adjacent to the distal radius on the dorsal aspect of the distal forearm, was not markedly tender today (although notes occasionally feels sore here), not erythematous with no bruising evident  Mildly tender to palpation around the base of the thumb over the first metatarsal region extending up towards the wrist joint and up the distal forearm, no focal tenderness to palpation over the wrist joint proper or any areas of the hand dorsally.  No focal snuff box tenderness  Good grip strength  Sensation intact in LT in forearm and hand  Pulses intact, good cap refill  Neuro/psychiatric - affect was not flat, appropriate with conversation  Alert and oriented  Speech and gait are normal   Results for orders placed or performed in visit on 03/16/19  TSH  Result Value Ref Range   TSH 1.89 0.40 - 4.50 mIU/L  Lipid panel  Result Value Ref Range   Cholesterol 152 <200 mg/dL   HDL 52 > OR = 50 mg/dL   Triglycerides 141 <150 mg/dL   LDL Cholesterol (Calc) 76 mg/dL (calc)  Total CHOL/HDL Ratio 2.9 <5.0 (calc)   Non-HDL Cholesterol (Calc) 100 <130 mg/dL (calc)  COMPLETE METABOLIC PANEL WITH GFR  Result Value Ref Range   Glucose, Bld 114 (H) 65 - 99 mg/dL   BUN 26 (H) 7 - 25 mg/dL    Creat 1.23 (H) 0.60 - 0.88 mg/dL   GFR, Est Non African American 39 (L) > OR = 60 mL/min/1.38m2   GFR, Est African American 45 (L) > OR = 60 mL/min/1.4m2   BUN/Creatinine Ratio 21 6 - 22 (calc)   Sodium 135 135 - 146 mmol/L   Potassium 5.5 (H) 3.5 - 5.3 mmol/L   Chloride 100 98 - 110 mmol/L   CO2 22 20 - 32 mmol/L   Calcium 10.4 8.6 - 10.4 mg/dL   Total Protein 7.4 6.1 - 8.1 g/dL   Albumin 4.6 3.6 - 5.1 g/dL   Globulin 2.8 1.9 - 3.7 g/dL (calc)   AG Ratio 1.6 1.0 - 2.5 (calc)   Total Bilirubin 1.3 (H) 0.2 - 1.2 mg/dL   Alkaline phosphatase (APISO) 87 37 - 153 U/L   AST 26 10 - 35 U/L   ALT 25 6 - 29 U/L       Assessment & Plan:   1. Hand numbness Has noted some distal numbness in both hands in the past, although more recently having the radicular concerns down the left upper extremity noted.  C-spine x-rays were obtained with those results reviewed.  Does have degenerative disc changes noted.  She noted she did not really benefit at all from the steroid course, (except her left hip felt a little better with the steroid).  Discussed the product called Neurontin which may be worth trying to help, and agreed to not add today, but may on the follow-up in a few weeks when hopefully her wrist symptoms are improved.  2. Left wrist pain We will get an x-ray today, as do feel we need to ensure that is okay. Discussed that bony prominence noted being possibly an osteophyte, like a bone spur process, and await the x-ray result. Do feel it is most consistent with the De Quervain's tenosynovitis and management for this as below.  3. De Quervain's tenosynovitis, left Educated on this today.  Recommended obtaining a soft wrist restraint and use all the time, even when sleeping over the next week, and can have off during the day when resting and not active with any wrist joint movements, and can also apply cold topically at that time intermittently to help.  Can use the Tylenol products as needed  as she does not like to take ibuprofen, and she noted after yesterday wrapping it, she really did not feel like she needed the Tylenol. Advised to ensure that the wrist restraint is not too tight as well. Did know I can refer her to orthopedics, although if the x-ray is okay, do not feel there is much more they will add, and she was comfortable not having the referral today.  She would prefer to try the above.  4. DDD cervical spine As noted above, evident on the cervical spine films.  We will keep the follow-up planned in about 3-1/2 weeks time, follow-up sooner as needed.  To consider Neurontin addition on a follow-up visit.  As she was leaving, she asked about what she could eat or drink to increase her sodium, and recommended a Gatorade type product in addition to mixing with water to stay hydrated, and discouraged adding a lot of salt including  table salt of products presently.      Towanda Malkin, MD 05/10/19 11:20 AM

## 2019-05-15 ENCOUNTER — Other Ambulatory Visit: Payer: Self-pay

## 2019-05-15 ENCOUNTER — Ambulatory Visit (INDEPENDENT_AMBULATORY_CARE_PROVIDER_SITE_OTHER): Payer: Medicare HMO | Admitting: Obstetrics & Gynecology

## 2019-05-15 ENCOUNTER — Encounter: Payer: Self-pay | Admitting: Obstetrics & Gynecology

## 2019-05-15 VITALS — BP 120/80 | Ht 64.0 in | Wt 145.0 lb

## 2019-05-15 DIAGNOSIS — N814 Uterovaginal prolapse, unspecified: Secondary | ICD-10-CM

## 2019-05-15 DIAGNOSIS — N8110 Cystocele, unspecified: Secondary | ICD-10-CM | POA: Diagnosis not present

## 2019-05-15 NOTE — Progress Notes (Signed)
HPI:      [name] is a 86 y.o. [phone] who presents today for her pessary follow up and examination related to her pelvic floor weakening.  Pt reports tolerating the pessary well with no vaginal bleeding and no vaginal discharge.  Symptoms of pelvic floor weakening have greatly improved. She is voiding and defecating without difficulty. She currently has a Ring #3 pessary.  PMHx: She  has a past medical history of Breast mass in female, Cataract, Difficult intubation (03/03/2016), Diverticulitis, Diverticulosis (05/28/2015), Essential thrombocytosis ([place]) (07/23/2015), Genital prolapse, GERD (gastroesophageal reflux disease), Rosanna Randy syndrome (06/30/2016), Hyperlipemia, Hyperlipidemia, Hypothyroidism, Mild atherosclerosis of carotid artery (06/25/2015), Morton's neuroma, MRI contraindicated due to metal implant, Prolapse of uterus, Radiculopathy, Renal cyst, Rosacea, Shingles, and Thrombocytosis ([place]). Also,  has a past surgical history that includes Tonsillectomy; Appendectomy; Cesarean section; Joint replacement (Right); Thyroidectomy; Colon surgery; Cataract extraction w/PHACO (Left, 03/25/2015); Adenoidectomy; biopsy, right breast ; Total hip arthroplasty; Thyroid lobectomy; and Upper gi endoscopy (10/17/2008)., family history includes Dementia in her maternal grandfather; Stroke in her father and mother.,  reports that she has quit smoking. She has never used smokeless tobacco. She reports that she does not drink alcohol or use drugs.  She has a current medication list which includes the following prescription(s): acetaminophen, amlodipine, aspirin ec, atorvastatin, vitamin d3, famotidine, fluticasone, hydroxyurea, irbesartan, ketoconazole, levothyroxine, and polyethylene glycol powder. Also, is allergic to novocain [procaine].  Review of Systems  All other systems reviewed and are negative.   Objective: BP 120/80   Ht 5\' 4"  (1.626 m)   Wt 145 lb (65.8 kg)   BMI 24.89 kg/m  Physical  Exam Constitutional:      General: She is not in acute distress.    Appearance: She is well-developed.  Genitourinary:     Pelvic exam was performed with patient supine.     Vagina and uterus normal.     No vaginal erythema or bleeding.     No cervical motion tenderness, discharge, polyp or nabothian cyst.     Uterus is mobile.     Uterus is not enlarged.     No uterine mass detected.    Uterus is midaxial.     No right or left adnexal mass present.     Right adnexa not tender.     Left adnexa not tender.     Genitourinary Comments:   Gr 2 uterine prolapse Gr 2 cystocele     HENT:     Head: Normocephalic and atraumatic.     Nose: Nose normal.  Abdominal:     General: There is no distension.     Palpations: Abdomen is soft.     Tenderness: There is no abdominal tenderness.  Musculoskeletal:        General: Normal range of motion.  Neurological:     Mental Status: She is alert and oriented to person, place, and time.     Cranial Nerves: No cranial nerve deficit.  Skin:    General: Skin is warm and dry.  Psychiatric:        Attention and Perception: Attention normal.        Mood and Affect: Mood and affect normal.        Speech: Speech normal.        Behavior: Behavior normal.        Thought Content: Thought content normal.        Judgment: Judgment normal.     Pessary Care Pessary removed and cleaned.  Vagina  checked - without erosions - pessary replaced.  A/P:1. Uterine prolapse 2. Cystocele with prolapse Pessary was cleaned and replaced today. Instructions given for care. Concerning symptoms to observe for are counseled to patient. Follow up scheduled for 4 months (per pt request).  A total of 20 minutes were spent face-to-face with the patient as well as preparation, review, communication, and documentation during this encounter.   Barnett Applebaum, MD, [facility], [facility] 05/15/2019  1:47 PM

## 2019-05-16 DIAGNOSIS — H26492 Other secondary cataract, left eye: Secondary | ICD-10-CM | POA: Diagnosis not present

## 2019-05-16 DIAGNOSIS — H2511 Age-related nuclear cataract, right eye: Secondary | ICD-10-CM | POA: Diagnosis not present

## 2019-05-22 DIAGNOSIS — I10 Essential (primary) hypertension: Secondary | ICD-10-CM | POA: Diagnosis not present

## 2019-05-22 DIAGNOSIS — H2511 Age-related nuclear cataract, right eye: Secondary | ICD-10-CM | POA: Diagnosis not present

## 2019-05-23 ENCOUNTER — Other Ambulatory Visit: Payer: Self-pay

## 2019-05-23 ENCOUNTER — Encounter: Payer: Self-pay | Admitting: Internal Medicine

## 2019-05-23 ENCOUNTER — Ambulatory Visit (INDEPENDENT_AMBULATORY_CARE_PROVIDER_SITE_OTHER): Payer: Medicare HMO | Admitting: Internal Medicine

## 2019-05-23 VITALS — BP 120/62 | HR 96 | Temp 97.3°F | Resp 16 | Ht 64.0 in | Wt 142.0 lb

## 2019-05-23 DIAGNOSIS — M25532 Pain in left wrist: Secondary | ICD-10-CM

## 2019-05-23 DIAGNOSIS — R69 Illness, unspecified: Secondary | ICD-10-CM | POA: Diagnosis not present

## 2019-05-23 DIAGNOSIS — F418 Other specified anxiety disorders: Secondary | ICD-10-CM

## 2019-05-23 DIAGNOSIS — K219 Gastro-esophageal reflux disease without esophagitis: Secondary | ICD-10-CM | POA: Diagnosis not present

## 2019-05-23 DIAGNOSIS — R110 Nausea: Secondary | ICD-10-CM | POA: Diagnosis not present

## 2019-05-23 NOTE — Progress Notes (Signed)
Patient ID: Gina [name], female    DOB: May 27, 1926, 86 y.o.   MRN: [id number]  PCP: Towanda Malkin, MD  Chief Complaint  Patient presents with  . Nausea    for 3 weeks now    Subjective:   Gina [name] is a 86 y.o. female, presents to clinic with CC of the following:  Chief Complaint  Patient presents with  . Nausea    for 3 weeks now    HPI:  Patient is a 86 year old female who returns today for follow-up. I have more recently been seeing her for left hand numbness and wrist pain, felt to have an element of de Quervain's tenosynovitis, and cervical spine films obtained as part of the work-up noted DDD of the cervical spine with those x-rays obtained due to some concerning radicular symptoms down the upper extremities noted.  A left wrist x-ray obtained last visit was reviewed with the patient, and noted some degenerative changes at the first [facility] joint.  Since last visit with me on 05/10/2019, she notes she has been wearing her wrist restraint which she had with her today, and the wrist is feeling better.  She has been limiting its use some, and states that she is mostly one-handed now which has increased her stresses some.  Her main complaint today is nausea, present now for 3 weeks.  She notes she has always had problems with nausea, since she was a child, and that when stress is increased, it gets her in the gut as she put it.  She has had no vomiting.  Denies any marked abdominal pains.  Does have reflux, and that has worsened some as well.  She has been taking the famotidine which she states helps.  Has not had fevers. She notes that stresses have been higher in the recent past, looking for a new place to live with assisted living, and that does get her down some as she feels like she has no control over some situations.  She states she is very OCD, and without control really causes stress for her.  With the nausea, she does seem to belch a lot as well.  Denies  other more concerning changes with bowel habits. She also notes the Covid pandemic and living through it has been very challenging, and how others are reacting including being out and about without mass, especially younger ones on spring break has been very upsetting to her. She notes she really does not want to add any more medicines to her regimen for nausea or anxiety/depression concerns.  She notes she has seen a therapist in the past, not recent past when I discussed how they can often be helpful.  Of note, She did follow-up 3/22 with OB/GYN for Uterine prolapse and a Cystocele with prolapse with the Pessary cleaned and replaced.  A 67-month follow-up was recommended She is scheduled for a cataract procedure in April, although if she is having persistent nausea, the eye doctor felt it was best for her to delay that surgery and she thinks that is appropriate as well.  She wanted my opinion and I agreed with that.  (It is an elective procedure)  Patient Active Problem List   Diagnosis Date Noted  . Degenerative disc disease, cervical 05/10/2019  . De Quervain's tenosynovitis, left 05/10/2019  . Bilateral hand numbness 05/10/2019  . DNR (do not resuscitate) 04/22/2018  . Venous stasis 04/21/2018  . Cystocele with prolapse 04/01/2017  . Hemorrhoids 07/29/2016  .  Gilbert syndrome 06/30/2016  . Essential thrombocytosis ([place]) 07/23/2015  . Mild atherosclerosis of carotid artery 06/25/2015  . Dyslipidemia 06/03/2015  . Diverticulosis 05/28/2015  . GERD without esophagitis 02/06/2015  . Neuropathy 01/31/2015  . Cervical radiculopathy 01/31/2015  . Stage 3 chronic kidney disease 10/06/2014  . Renal cyst 10/06/2014  . Uterine prolapse 10/06/2014  . Cervical radiculopathy due to degenerative joint disease of spine 10/06/2014  . History of right hip replacement 10/06/2014  . Hypothyroidism 10/05/2014  . Essential hypertension 09/13/2014      Current Outpatient Medications:  .   Acetaminophen 500 MG coapsule, Take by mouth every 4 (four) hours as needed for fever., Disp: , Rfl:  .  amLODipine (NORVASC) 5 MG tablet, Take 1 tablet (5 mg total) by mouth daily., Disp: 90 tablet, Rfl: 3 .  aspirin EC 81 MG tablet, Take 81 mg by mouth daily. , Disp: , Rfl:  .  atorvastatin (LIPITOR) 20 MG tablet, TAKE 1 TABLET BY MOUTH AT BEDTIME, Disp: 90 tablet, Rfl: 2 .  Cholecalciferol (VITAMIN D3) 1000 units CAPS, Take by mouth daily. , Disp: , Rfl:  .  famotidine (PEPCID) 20 MG tablet, Take one tab once to twice daily as needed, Disp: 60 tablet, Rfl: 3 .  fluticasone (FLONASE) 50 MCG/ACT nasal spray, , Disp: , Rfl:  .  hydroxyurea (HYDREA) 500 MG capsule, Take 500 mg (1 capsule) on Monday, Wednesday, and Friday., Disp: 30 capsule, Rfl: 5 .  irbesartan (AVAPRO) 150 MG tablet, Take 1 tablet (150 mg total) by mouth daily., Disp: 90 tablet, Rfl: 3 .  ketoconazole (NIZORAL) 2 % shampoo, Apply 1 application topically 2 (two) times a week. , Disp: , Rfl:  .  levothyroxine (SYNTHROID) 88 MCG tablet, TAKE 1 TABLET BY MOUTH ONCE DAILY BEFORE BREAKFAST, Disp: 90 tablet, Rfl: 3 .  polyethylene glycol powder (GLYCOLAX/MIRALAX) powder, Take 1 Container by mouth once., Disp: , Rfl:    Allergies  Allergen Reactions  . Novocain [Procaine] Palpitations     Past Surgical History:  Procedure Laterality Date  . ADENOIDECTOMY    . APPENDECTOMY    . biopsy, right breast     . CATARACT EXTRACTION W/PHACO Left 03/25/2015   Procedure: CATARACT EXTRACTION PHACO AND INTRAOCULAR LENS PLACEMENT (IOC);  Surgeon: Estill Cotta, MD;  Location: [facility] ORS;  Service: Ophthalmology;  Laterality: Left;  [place] 02:09 AP% 27.3 CDE 59.97 fluid pack lot [id number]  . CESAREAN SECTION    . COLON SURGERY    . JOINT REPLACEMENT Right    total hip  . THYROID LOBECTOMY    . THYROIDECTOMY     partial  . TONSILLECTOMY    . TOTAL HIP ARTHROPLASTY    . UPPER GI ENDOSCOPY  10/17/2008     Family History  Problem  Relation Age of Onset  . Stroke Mother   . Stroke Father   . Dementia Maternal Grandfather      Social History   Tobacco Use  . Smoking status: Former Research scientist (life sciences)  . Smokeless tobacco: Never Used  Substance Use Topics  . Alcohol use: No    Alcohol/week: 0.0 standard drinks    With staff assistance, above reviewed with the patient today.  ROS: As per HPI, otherwise no specific complaints on a limited and focused system review   No results found for this or any previous visit (from the past 72 hour(s)).   PHQ2/9: Depression screen [facility] 2/9 05/23/2019 05/10/2019 05/03/2019 04/28/2019 03/16/2019  Decreased Interest 3 1 1 1  0  Down, Depressed, Hopeless 3 1 1 1 1   PHQ - 2 Score 6 2 2 2 1   Altered sleeping 3 0 0 0 0  Tired, decreased energy 3 0 0 0 0  Change in appetite 3 0 0 0 0  Feeling bad or failure about yourself  1 0 0 0 0  Trouble concentrating 0 0 0 0 0  Moving slowly or fidgety/restless 0 0 0 0 0  Suicidal thoughts 0 0 0 0 0  PHQ-9 Score 16 2 2 2 1   Difficult doing work/chores Somewhat difficult Not difficult at all Not difficult at all Not difficult at all Not difficult at all  Some recent data might be hidden   PHQ-2/9 Result reviewed, significantly increased  Fall Risk: Fall Risk  05/10/2019 05/03/2019 04/28/2019 03/16/2019 09/12/2018  Falls in the past year? 0 0 0 0 0  Number falls in past yr: 0 0 0 0 0  Injury with Fall? 0 0 0 0 0      Objective:   Vitals:   05/23/19 1334  BP: 120/62  Pulse: 96  Resp: 16  Temp: (!) 97.3 F (36.3 C)  TempSrc: Temporal  SpO2: 97%  Weight: 142 lb (64.4 kg)  Height: 5\' 4"  (1.626 m)    Body mass index is 24.37 kg/m.  Physical Exam   NAD, masked, more anxious HEENT - [place]/AT, sclera anicteric, conj - non-inj'ed,  pharynx clear Neck - supple, no adenopathy, carotids 2+ and = without bruits bilat Car - RRR without m/g/r Pulm- RR and effort normal at rest, CTA without wheeze or rales Abd - soft, NT, ND, BS+,  no masses, no obvious  HSM, not tender in the epigastric region where she sometimes feels a pressure feeling with her nausea and at times the reflux feeling up her sternum. Back - no CVA tenderness Ext - no LE edema, the soft wrist restraint was removed, and she had adequate motion of her left wrist today.  A small superficial nodularity was more prominent on the distal forearm immediately cephalad to the wrist joint that was not markedly tender to palpation and had no overlying erythema or other skin changes.  (Likely more soft tissue inflammation above this bony prominence secondary to the wrist restraint she has been wearing) Neuro/psychiatric - affect was not flat, appropriate with conversation  Alert and oriented  Speech normal   Results for orders placed or performed in visit on 03/16/19  TSH  Result Value Ref Range   TSH 1.89 0.40 - 4.50 mIU/L  Lipid panel  Result Value Ref Range   Cholesterol 152 <200 mg/dL   HDL 52 > OR = 50 mg/dL   Triglycerides 141 <150 mg/dL   LDL Cholesterol (Calc) 76 mg/dL (calc)   Total CHOL/HDL Ratio 2.9 <5.0 (calc)   Non-HDL Cholesterol (Calc) 100 <130 mg/dL (calc)  COMPLETE METABOLIC PANEL WITH GFR  Result Value Ref Range   Glucose, Bld 114 (H) 65 - 99 mg/dL   BUN 26 (H) 7 - 25 mg/dL   Creat 1.23 (H) 0.60 - 0.88 mg/dL   GFR, Est Non African American 39 (L) > OR = 60 mL/min/1.70m2   GFR, Est African American 45 (L) > OR = 60 mL/min/1.38m2   BUN/Creatinine Ratio 21 6 - 22 (calc)   Sodium 135 135 - 146 mmol/L   Potassium 5.5 (H) 3.5 - 5.3 mmol/L   Chloride 100 98 - 110 mmol/L   CO2 22 20 - 32 mmol/L   Calcium 10.4  8.6 - 10.4 mg/dL   Total Protein 7.4 6.1 - 8.1 g/dL   Albumin 4.6 3.6 - 5.1 g/dL   Globulin 2.8 1.9 - 3.7 g/dL (calc)   AG Ratio 1.6 1.0 - 2.5 (calc)   Total Bilirubin 1.3 (H) 0.2 - 1.2 mg/dL   Alkaline phosphatase (APISO) 87 37 - 153 U/L   AST 26 10 - 35 U/L   ALT 25 6 - 29 U/L       Assessment & Plan:  1. Nausea She admitted this is not very new for  her, has had no vomiting, and feels likely related to the increase stresses recently as a source.  Also having more reflux.  Did feel it was worth checking some simple labs again today to ensure her electrolytes and kidney function are okay.  Also offered a medicine to use for nausea, and she really did not want to add that today. Discussed lessening anxiety and stress reduction techniques will be important to help, if this is exacerbated by that which we both agree it is.  Try to emphasize controlling what she can control, and did note counseling/therapist would likely be more helpful as that is their area of expertise.  From her history, she was understanding of that. - BASIC METABOLIC PANEL WITH GFR  2. Gastroesophageal reflux disease without esophagitis We will continue the famotidine at this point, as she notes that it is helpful, although may need to consider a PPI in the near future pending her status.  Do feel the stress increase is an exacerbated with this as well.  3. Anxiety with depression Her PHQ-9 was reviewed, and she notes she is more anxious than markedly depressed.  Do feel there are elements of both as we discussed.  Discussed medicines that may be helpful, and she very much did not want to add another medicine to her regimen presently.  Discussed as needed medicines that can be used for anxiety, and again she did not want to add them at this point. Also noted counseling could potentially be helpful and I do feel may be needed especially if we are not adding any medications to help.  4. Left wrist pain The wrist restraint seems to be helping, and this does seem to be improving.  Did recommend when she removes the wrist restraint to apply cold to the area, especially the area with some inflammation over that bony prominence as I do think that will be helpful. She assured me she is not taking any NSAIDs to help with any discomfort here in the recent past, and continue to avoid given her  kidney issues and her reflux symptoms.  Await the lab results presently. She will follow-up sooner to any scheduled follow-up should the symptoms not improve or are more problematic.      Towanda Malkin, MD 05/23/19 1:41 PM

## 2019-05-24 LAB — BASIC METABOLIC PANEL WITH GFR
BUN/Creatinine Ratio: 21 (calc) (ref 6–22)
BUN: 24 mg/dL (ref 7–25)
CO2: 24 mmol/L (ref 20–32)
Calcium: 9.7 mg/dL (ref 8.6–10.4)
Chloride: 97 mmol/L — ABNORMAL LOW (ref 98–110)
Creat: 1.17 mg/dL — ABNORMAL HIGH (ref 0.60–0.88)
GFR, Est African American: 48 mL/min/{1.73_m2} — ABNORMAL LOW (ref >=60)
GFR, Est Non African American: 41 mL/min/{1.73_m2} — ABNORMAL LOW (ref >=60)
Glucose, Bld: 144 mg/dL — ABNORMAL HIGH (ref 65–99)
Potassium: 4.8 mmol/L (ref 3.5–5.3)
Sodium: 132 mmol/L — ABNORMAL LOW (ref 135–146)

## 2019-06-05 ENCOUNTER — Telehealth: Payer: Self-pay

## 2019-06-05 ENCOUNTER — Telehealth: Payer: Self-pay | Admitting: Internal Medicine

## 2019-06-05 ENCOUNTER — Ambulatory Visit: Payer: Self-pay | Admitting: Internal Medicine

## 2019-06-05 NOTE — Telephone Encounter (Signed)
Patient wanted to let Dr. Roxan Hockey know that her husband fell again and that her nerves are bad and she is anxious in dealing with the situation.

## 2019-06-05 NOTE — Telephone Encounter (Signed)
Patient states she has neuropathy in her hands and feet and is wondering if a referral can be placed for therapy to help treat this issue.

## 2019-06-07 ENCOUNTER — Ambulatory Visit: Admission: RE | Admit: 2019-06-07 | Payer: Medicare HMO | Source: Home / Self Care | Admitting: Ophthalmology

## 2019-06-07 ENCOUNTER — Encounter: Admission: RE | Payer: Self-pay | Source: Home / Self Care

## 2019-06-07 DIAGNOSIS — H2511 Age-related nuclear cataract, right eye: Secondary | ICD-10-CM | POA: Diagnosis not present

## 2019-06-07 SURGERY — PHACOEMULSIFICATION, CATARACT, WITH IOL INSERTION
Anesthesia: Topical | Laterality: Right

## 2019-06-27 ENCOUNTER — Other Ambulatory Visit: Payer: Self-pay | Admitting: Obstetrics & Gynecology

## 2019-06-28 DIAGNOSIS — M79674 Pain in right toe(s): Secondary | ICD-10-CM | POA: Diagnosis not present

## 2019-06-28 DIAGNOSIS — M79675 Pain in left toe(s): Secondary | ICD-10-CM | POA: Diagnosis not present

## 2019-06-28 DIAGNOSIS — L603 Nail dystrophy: Secondary | ICD-10-CM | POA: Diagnosis not present

## 2019-06-28 DIAGNOSIS — N183 Chronic kidney disease, stage 3 unspecified: Secondary | ICD-10-CM | POA: Diagnosis not present

## 2019-06-28 DIAGNOSIS — L600 Ingrowing nail: Secondary | ICD-10-CM | POA: Diagnosis not present

## 2019-06-28 DIAGNOSIS — G629 Polyneuropathy, unspecified: Secondary | ICD-10-CM | POA: Diagnosis not present

## 2019-07-13 ENCOUNTER — Telehealth: Payer: Self-pay | Admitting: Internal Medicine

## 2019-07-13 NOTE — Chronic Care Management (AMB) (Signed)
  Chronic Care Management   Note  6/94/0982 Name: Gina [name] MRN: [id number] DOB: [birth year]  Gina [name] is a 86 y.o. year old female who is a primary care patient of Towanda Malkin, MD. I reached out to [employer] by phone today in response to a referral sent by Ms. Graylee Haslip's health plan.     Ms. Zabriskie was given information about Chronic Care Management services today including:  1. CCM service includes personalized support from designated clinical staff supervised by her physician, including individualized plan of care and coordination with other care providers 2. 24/7 contact phone numbers for assistance for urgent and routine care needs. 3. Service will only be billed when office clinical staff spend 20 minutes or more in a month to coordinate care. 4. Only one practitioner may furnish and bill the service in a calendar month. 5. The patient may stop CCM services at any time (effective at the end of the month) by phone call to the office staff. 6. The patient will be responsible for cost sharing (co-pay) of up to 20% of the service fee (after annual deductible is met).  Patient wishes to consider information provided and/or speak with her PCP  Follow up plan: The care management team is available to follow up with the patient after provider conversation with the patient regarding recommendation for care management engagement and subsequent re-referral to the care management team.   Noreene Larsson, [place], [place], [place] [address] Direct Dial: [phone] [email or web address] Website: [email or web address]

## 2019-07-20 ENCOUNTER — Other Ambulatory Visit: Payer: Self-pay

## 2019-07-20 ENCOUNTER — Other Ambulatory Visit: Payer: Self-pay | Admitting: Family Medicine

## 2019-07-20 ENCOUNTER — Encounter: Payer: Self-pay | Admitting: Family Medicine

## 2019-07-20 ENCOUNTER — Ambulatory Visit (INDEPENDENT_AMBULATORY_CARE_PROVIDER_SITE_OTHER): Payer: Medicare HMO | Admitting: Family Medicine

## 2019-07-20 VITALS — BP 118/72 | HR 90 | Temp 97.1°F | Resp 16 | Ht 64.0 in | Wt 138.4 lb

## 2019-07-20 DIAGNOSIS — R69 Illness, unspecified: Secondary | ICD-10-CM | POA: Diagnosis not present

## 2019-07-20 DIAGNOSIS — Z9181 History of falling: Secondary | ICD-10-CM | POA: Diagnosis not present

## 2019-07-20 DIAGNOSIS — S8002XA Contusion of left knee, initial encounter: Secondary | ICD-10-CM | POA: Diagnosis not present

## 2019-07-20 DIAGNOSIS — R202 Paresthesia of skin: Secondary | ICD-10-CM

## 2019-07-20 DIAGNOSIS — F4321 Adjustment disorder with depressed mood: Secondary | ICD-10-CM

## 2019-07-20 DIAGNOSIS — F419 Anxiety disorder, unspecified: Secondary | ICD-10-CM

## 2019-07-21 ENCOUNTER — Encounter: Payer: Self-pay | Admitting: Internal Medicine

## 2019-07-21 LAB — TEST AUTHORIZATION: TEST CODE:: 7065

## 2019-07-21 LAB — B12 AND FOLATE PANEL
Folate: 15.8 ng/mL
Vitamin B-12: 441 pg/mL (ref 200–1100)

## 2019-07-24 NOTE — Progress Notes (Signed)
Name: Gina [name]   MRN: [id number]    DOB: [birth year]   Date:07/24/2019       Progress Note  Subjective  Chief Complaint  Chief Complaint  Patient presents with  . Fall    4 days ago, felt hot and felt like she want to faint. EMS called. Vitald and EKG normal  . Greiving    husband passed 3 weeks ago    HPI  Patient came in today for evaluation of recent fall. She states she got up from her chair and felt lightheaded she fell forward and caught self with her left knee. She states she contacted EMS and they checked her heart and told her to follow up with [place]. She denies losing consciousness, she did not hit her head. She is a widow. She sates her husband died 3 weeks ago.. She has been crying and not eating or drinking fluids as often. She has been compliant  with her medications. She states she has not been sleeping well, making a lot of arrangements to move into an assistant living facility . She knows it is part of normal grieving process. She states her sons have been very supportive. She has a long history of paresthesia of lower extremities and the fall has caused left knee pain and worsening of gait. She states mild swelling but no redness or increase in warmth. Pain is worse when bearing weight but mild   Patient Active Problem List   Diagnosis Date Noted  . Degenerative disc disease, cervical 05/10/2019  . De Quervain's tenosynovitis, left 05/10/2019  . Bilateral hand numbness 05/10/2019  . DNR (do not resuscitate) 04/22/2018  . Venous stasis 04/21/2018  . Cystocele with prolapse 04/01/2017  . Hemorrhoids 07/29/2016  . Gilbert syndrome 06/30/2016  . Essential thrombocytosis ([place]) 07/23/2015  . Mild atherosclerosis of carotid artery 06/25/2015  . Dyslipidemia 06/03/2015  . Diverticulosis 05/28/2015  . GERD without esophagitis 02/06/2015  . Neuropathy 01/31/2015  . Cervical radiculopathy 01/31/2015  . Stage 3 chronic kidney disease 10/06/2014  . Renal cyst 10/06/2014  .  Uterine prolapse 10/06/2014  . Cervical radiculopathy due to degenerative joint disease of spine 10/06/2014  . History of right hip replacement 10/06/2014  . Hypothyroidism 10/05/2014  . Essential hypertension 09/13/2014    Past Surgical History:  Procedure Laterality Date  . ADENOIDECTOMY    . APPENDECTOMY    . biopsy, right breast     . CATARACT EXTRACTION W/PHACO Left 03/25/2015   Procedure: CATARACT EXTRACTION PHACO AND INTRAOCULAR LENS PLACEMENT (IOC);  Surgeon: Estill Cotta, MD;  Location: [facility] ORS;  Service: Ophthalmology;  Laterality: Left;  [place] 02:09 AP% 27.3 CDE 59.97 fluid pack lot [id number]  . CESAREAN SECTION    . COLON SURGERY    . JOINT REPLACEMENT Right    total hip  . THYROID LOBECTOMY    . THYROIDECTOMY     partial  . TONSILLECTOMY    . TOTAL HIP ARTHROPLASTY    . UPPER GI ENDOSCOPY  10/17/2008    Family History  Problem Relation Age of Onset  . Stroke Mother   . Stroke Father   . Dementia Maternal Grandfather     Social History   Tobacco Use  . Smoking status: Former Research scientist (life sciences)  . Smokeless tobacco: Never Used  Substance Use Topics  . Alcohol use: No    Alcohol/week: 0.0 standard drinks     Current Outpatient Medications:  .  Acetaminophen 500 MG coapsule, Take by mouth every 4 (four) hours as  needed for fever., Disp: , Rfl:  .  amLODipine (NORVASC) 5 MG tablet, Take 1 tablet (5 mg total) by mouth daily., Disp: 90 tablet, Rfl: 3 .  aspirin EC 81 MG tablet, Take 81 mg by mouth daily. , Disp: , Rfl:  .  atorvastatin (LIPITOR) 20 MG tablet, TAKE 1 TABLET BY MOUTH AT BEDTIME, Disp: 90 tablet, Rfl: 2 .  Cholecalciferol (VITAMIN D3) 1000 units CAPS, Take by mouth daily. , Disp: , Rfl:  .  famotidine (PEPCID) 20 MG tablet, Take one tab once to twice daily as needed, Disp: 60 tablet, Rfl: 3 .  fluticasone (FLONASE) 50 MCG/ACT nasal spray, , Disp: , Rfl:  .  hydroxyurea (HYDREA) 500 MG capsule, Take 500 mg (1 capsule) on Monday, Wednesday, and Friday.,  Disp: 30 capsule, Rfl: 5 .  irbesartan (AVAPRO) 150 MG tablet, Take 1 tablet (150 mg total) by mouth daily., Disp: 90 tablet, Rfl: 3 .  ketoconazole (NIZORAL) 2 % shampoo, Apply 1 application topically 2 (two) times a week. , Disp: , Rfl:  .  levothyroxine (SYNTHROID) 88 MCG tablet, TAKE 1 TABLET BY MOUTH ONCE DAILY BEFORE BREAKFAST, Disp: 90 tablet, Rfl: 3 .  polyethylene glycol powder (GLYCOLAX/MIRALAX) powder, Take 1 Container by mouth once., Disp: , Rfl:   Allergies  Allergen Reactions  . Novocain [Procaine] Palpitations    I personally reviewed active problem list, medication list, allergies, family history, social history, health maintenance with the patient/caregiver today.   ROS  Ten systems reviewed and is negative except as mentioned in HPI  Objective  Vitals:   07/20/19 1113  BP: 118/72  Pulse: 90  Resp: 16  Temp: (!) 97.1 F (36.2 C)  TempSrc: Temporal  SpO2: 96%  Weight: 138 lb 6.4 oz (62.8 kg)  Height: 5\' 4"  (1.626 m)    Body mass index is 23.76 kg/m.  Physical Exam   Constitutional: Patient appears well-developed and well-nourished. Appears younger than her stated age  44: head atraumatic, normocephalic, pupils equal and reactive to light,  neck supple, throat not done Cardiovascular: Normal rate, regular rhythm and normal heart sounds.  No murmur heard. No BLE edema. Pulmonary/Chest: Effort normal and breath sounds normal. No respiratory distress. Abdominal: Soft.  There is no tenderness. Muscular skeletal: pain with extension of left knee, mild effusion, no erythema , ecchymosis or increase in warmth Psychiatric: Patient has a normal mood and affect. behavior is normal. Judgment and thought content normal.  Recent Results (from the past 2160 hour(s))  BASIC METABOLIC PANEL WITH GFR     Status: Abnormal   Collection Time: 05/23/19  2:11 PM  Result Value Ref Range   Glucose, Bld 144 (H) 65 - 99 mg/dL    Comment: .            Fasting reference  interval . For someone without known diabetes, a glucose value >125 mg/dL indicates that they may have diabetes and this should be confirmed with a follow-up test. .    BUN 24 7 - 25 mg/dL   Creat 1.17 (H) 0.60 - 0.88 mg/dL    Comment: For patients >75 years of age, the reference limit for Creatinine is approximately 13% higher for people identified as African-American. .    GFR, Est Non African American 41 (L) > OR = 60 mL/min/1.37m2   GFR, Est African American 48 (L) > OR = 60 mL/min/1.66m2   BUN/Creatinine Ratio 21 6 - 22 (calc)   Sodium 132 (L) 135 - 146 mmol/L  Potassium 4.8 3.5 - 5.3 mmol/L   Chloride 97 (L) 98 - 110 mmol/L   CO2 24 20 - 32 mmol/L   Calcium 9.7 8.6 - 10.4 mg/dL  B12 and Folate Panel     Status: None   Collection Time: 07/20/19 12:09 PM  Result Value Ref Range   Vitamin B-12 441 200 - 1,100 pg/mL   Folate 15.8 ng/mL    Comment:                            Reference Range                            Low:           <3.4                            Borderline:    3.4-5.4                            Normal:        >5.4 .   TEST AUTHORIZATION     Status: None   Collection Time: 07/20/19 12:09 PM  Result Value Ref Range   TEST NAME: B12 AND FOLATE    TEST CODE: 7,065    CLIENT CONTACT: DR. Gretta Began    REPORT ALWAYS MESSAGE SIGNATURE      Comment: . The laboratory testing on this patient was verbally requested or confirmed by the ordering physician or his or her authorized representative after contact with an employee of [employer]. Federal regulations require that we maintain on file written authorization for all laboratory testing.  Accordingly we are asking that the ordering physician or his or her authorized representative sign a copy of this report and promptly return it to the client service representative. . . Signature:____________________________________________________ .       PHQ2/9: Depression screen [facility] 2/9 07/20/2019 05/23/2019  05/10/2019 05/03/2019 04/28/2019  Decreased Interest 0 3 1 1 1   Down, Depressed, Hopeless 0 3 1 1 1   PHQ - 2 Score 0 6 2 2 2   Altered sleeping 0 3 0 0 0  Tired, decreased energy 0 3 0 0 0  Change in appetite 0 3 0 0 0  Feeling bad or failure about yourself  0 1 0 0 0  Trouble concentrating 0 0 0 0 0  Moving slowly or fidgety/restless 0 0 0 0 0  Suicidal thoughts 0 0 0 0 0  PHQ-9 Score 0 16 2 2 2   Difficult doing work/chores Not difficult at all Somewhat difficult Not difficult at all Not difficult at all Not difficult at all  Some recent data might be hidden    phq 9 is negative, she states she is grieving but not depressed    Fall Risk: Fall Risk  07/20/2019 05/10/2019 05/03/2019 04/28/2019 03/16/2019  Falls in the past year? 1 0 0 0 0  Number falls in past yr: 0 0 0 0 0  Injury with Fall? 0 0 0 0 0  Follow up Falls evaluation completed - - - -    Assessment & Plan   1. History of recent fall  Discussed fall prevention, she does not want a life line necklace because she will be moving to an assistant facility soon  bp towards low end of normal, she  will try to stay hydrated and recheck bp, goal is close to 130/80 for her, explained that for her age her bp is low today and low bp can cause lightheadedness and falls.   2. Contusion of left knee, initial encounter  Advised ice, elevation, rest and tylenol, she does not want any other medications at this time  3. Paresthesia of upper and lower extremities of both sides  Chronic, we will check b12 and folate  4. Grieving  Discussed hospice grieving counseling   5. Anxiety  She states she will be okay and does not want medications

## 2019-07-26 DIAGNOSIS — N1832 Chronic kidney disease, stage 3b: Secondary | ICD-10-CM | POA: Diagnosis not present

## 2019-07-31 ENCOUNTER — Other Ambulatory Visit: Payer: Self-pay

## 2019-07-31 MED ORDER — ATORVASTATIN CALCIUM 20 MG PO TABS: 20.0000 mg | ORAL_TABLET | Freq: Every day | ORAL | 2 refills | Status: DC

## 2019-08-09 ENCOUNTER — Telehealth: Payer: Self-pay

## 2019-08-09 NOTE — Telephone Encounter (Signed)
Patient has started her 100 days of waiting period for [facility]. She currently has [facility] coming to her house weekly to help her manage her household and be there to monitor when she showers. She is afraid home alone and has a hard holding things when her wrist and hands get weak at times and managing the everyday chores.  Copied from [place] [id number]. Topic: General - Other >> Aug 08, 2019  3:13 PM Keene Breath wrote: Reason for CRM: Patient called and said she missed a call from the office.  She is not sure why she was called.  Please call back at [phone]. >> Aug 08, 2019  3:23 PM Karene Fry P wrote: No one upfront called her, did you?

## 2019-08-23 ENCOUNTER — Telehealth: Payer: Self-pay

## 2019-08-23 NOTE — Telephone Encounter (Signed)
Spoke with patient, informed her that the forms will be put in the mail today.   Copied from [place] [phone]. Topic: General - Inquiry >> Aug 23, 2019  9:15 AM Gina [name] wrote: Reason for CRM: pt called and stated she dropped off paperwork [name] couple weeks ago.  It was for [employer] benefits .  She would like to know the status?   Best number [phone]

## 2019-09-08 ENCOUNTER — Other Ambulatory Visit: Payer: Medicare HMO

## 2019-09-08 ENCOUNTER — Ambulatory Visit: Payer: Medicare HMO | Admitting: Oncology

## 2019-09-11 ENCOUNTER — Other Ambulatory Visit: Payer: Self-pay

## 2019-09-11 DIAGNOSIS — I10 Essential (primary) hypertension: Secondary | ICD-10-CM

## 2019-09-12 MED ORDER — AMLODIPINE BESYLATE 5 MG PO TABS: 5.0000 mg | ORAL_TABLET | Freq: Every day | ORAL | 3 refills | Status: DC

## 2019-09-13 ENCOUNTER — Other Ambulatory Visit: Payer: Self-pay

## 2019-09-13 ENCOUNTER — Encounter: Payer: Self-pay | Admitting: Internal Medicine

## 2019-09-13 ENCOUNTER — Ambulatory Visit (INDEPENDENT_AMBULATORY_CARE_PROVIDER_SITE_OTHER): Payer: Medicare HMO | Admitting: Internal Medicine

## 2019-09-13 VITALS — BP 112/78 | HR 82 | Temp 98.1°F | Resp 16 | Ht 64.0 in | Wt 141.5 lb

## 2019-09-13 DIAGNOSIS — K219 Gastro-esophageal reflux disease without esophagitis: Secondary | ICD-10-CM | POA: Diagnosis not present

## 2019-09-13 DIAGNOSIS — F4321 Adjustment disorder with depressed mood: Secondary | ICD-10-CM

## 2019-09-13 DIAGNOSIS — E890 Postprocedural hypothyroidism: Secondary | ICD-10-CM

## 2019-09-13 DIAGNOSIS — I10 Essential (primary) hypertension: Secondary | ICD-10-CM | POA: Diagnosis not present

## 2019-09-13 DIAGNOSIS — E785 Hyperlipidemia, unspecified: Secondary | ICD-10-CM | POA: Diagnosis not present

## 2019-09-13 DIAGNOSIS — R202 Paresthesia of skin: Secondary | ICD-10-CM

## 2019-09-13 DIAGNOSIS — D473 Essential (hemorrhagic) thrombocythemia: Secondary | ICD-10-CM

## 2019-09-13 DIAGNOSIS — M5412 Radiculopathy, cervical region: Secondary | ICD-10-CM | POA: Diagnosis not present

## 2019-09-13 DIAGNOSIS — F418 Other specified anxiety disorders: Secondary | ICD-10-CM

## 2019-09-13 DIAGNOSIS — N1831 Chronic kidney disease, stage 3a: Secondary | ICD-10-CM | POA: Diagnosis not present

## 2019-09-13 DIAGNOSIS — R69 Illness, unspecified: Secondary | ICD-10-CM | POA: Diagnosis not present

## 2019-09-13 DIAGNOSIS — Z23 Encounter for immunization: Secondary | ICD-10-CM

## 2019-09-13 DIAGNOSIS — M654 Radial styloid tenosynovitis [de Quervain]: Secondary | ICD-10-CM

## 2019-09-13 NOTE — Progress Notes (Signed)
Patient ID: Gina [name], female    DOB: 1927/01/19, 86 y.o.   MRN: [id number]  PCP: Towanda Malkin, MD  Chief Complaint  Patient presents with  . Follow-up    patient states "here to follow up"    Subjective:   Gina [name] is a 86 y.o. female, presents to clinic with CC of the following:  Chief Complaint  Patient presents with  . Follow-up    patient states "here to follow up"    HPI:  Patient is a 86 year old female My last visit with her was 05/23/2019 She was seen here by Dr. Ancil Boozer in May approximately 3 weeks after her husband passed away after a fall.  That note was reviewed.  Due to some paresthesia concerns, her B12 and folate levels were checked and were normal.  She was offered a referral at that time to a neurologist or to a person that saw her previously for cervical radiculopathy concerns and she did not want to proceed with that at that time. She follows up today.  Note from 08/09/2019 read as follows:  Patient has started her 100 days of waiting period for [facility]. She currently has [place] coming to her house weekly to help her manage her household and be there to monitor when she showers. She is afraid home alone and has a hard holding things when her wrist and hands get weak at times and managing the everyday chores.  Paperwork was completed as requested to try to help.  She is planning to move to the assisted living facility soon.  Communication from labs that were obtained after her visit in January was as follows:  Please let her know her thyroid test was good (TSH-1.89) , her lipid panel was also good and improved from 6 months ago. Remain on the current dose of her thyroid med and the statin. Her glucose was a little high (114), consistent with prediabetes, not diabetes and was better than the last check 5 months ago (126). Continue to manage with diet.  Her kidney function was noted to be stable on  last check in March, with her sodium and chloride just a little low and her potassium was normal on that check.   HTN Medication regimen regimen-amlodipine 5mg  daily and irbesartan 150mg  daily.  Takes medications as prescribed, not missing doses routinely. She occasionallychecks blood pressures at home with range of130's/70's Denies chest pain, SOB, increased headaches.   BP Readings from Last 3 Encounters:  09/13/19 112/78  07/20/19 118/72  05/23/19 120/62     Hyperlipidemia Medication regimen-atorvastatin 20mg  nightly. Takes medications as prescribed. Denies myalgias   Lab Results  Component Value Date   CHOL 152 03/16/2019   HDL 52 03/16/2019   LDLCALC 76 03/16/2019   TRIG 141 03/16/2019   CHOLHDL 2.9 03/16/2019     Hypothyroidism, h/o partial thyroidectomy Medication regimen-levothryoxine. Has been on this dose formanyyears.  Lab Results  Component Value Date   TSH 1.89 03/16/2019    Thrombocytosis/leukocytosis Follows up with Dr. Grayland Ormond; last visit 03/14/2019 and noted was doing well. That note was reviewed.Goes again Aug 6 Takes hydroxyurea - may increase dose back to four times daily in near future with platelet count increased last visit,currently taking three times daily (pending follow-up checks).  CKD - stage 3, renal cyst, HTN   continue to follow with nephrology, Dr. Candiss Norse, next visit in September  GERD: Takes pepcid prn and helpful, about 2-3 times  per week at present  Cervical radiculopathy/ Paresthesias Takes tylenol PRN in past, not take anything presently. Notes she still gets numbness at the tips of her fingers fairly frequently Noted not limiting her activities presently and has tried massage to help, also does exercises routinely.  Discussed potential referral or other studies at this point to further assess, and she was not interested at this time to proceed with that.  OB-GYN follow-up: She continues to follow-up with  OB/GYN for Uterine prolapse and a Cystocele with prolapse with the Pessary cleaned and replaced.  A 79-month follow-up was recommended, which is next week Prior breast biopsy 05/2018 - f/u mammography order recently placed, not yet obtained  Continue to follow with Dr. Kenton Kingfisher   Cataracts, s/p surgery:  ophth continues to follow. She noted more difficulty with vision from right eye now and was wanting to have an elective procedure (cataract on right), was delayed due to Covid. Deferred in the more recent past and given new glasses which is very helpful   left hand numbness and wrist pain -  felt to have an element of de Quervain's tenosynovitis, still hurts at times, notes the splint really helps. She has been limiting its use some.  She notes she is learning to manage quite well with the splint in place presently.   Anxiety with depression/grieving  She notes she has been handling things very well after her husband's death, she is very much looking forward to moving into the assisted living facility.  She was very upbeat today, and notes she has been handling stresses well recently.  Patient Active Problem List   Diagnosis Date Noted  . Degenerative disc disease, cervical 05/10/2019  . De Quervain's tenosynovitis, left 05/10/2019  . Bilateral hand numbness 05/10/2019  . DNR (do not resuscitate) 04/22/2018  . Venous stasis 04/21/2018  . Cystocele with prolapse 04/01/2017  . Hemorrhoids 07/29/2016  . Gilbert syndrome 06/30/2016  . Essential thrombocytosis ([place]) 07/23/2015  . Mild atherosclerosis of carotid artery 06/25/2015  . Dyslipidemia 06/03/2015  . Diverticulosis 05/28/2015  . GERD without esophagitis 02/06/2015  . Neuropathy 01/31/2015  . Cervical radiculopathy 01/31/2015  . Stage 3 chronic kidney disease 10/06/2014  . Renal cyst 10/06/2014  . Uterine prolapse 10/06/2014  . Cervical radiculopathy due to degenerative joint disease of spine 10/06/2014  . History of right hip  replacement 10/06/2014  . Hypothyroidism 10/05/2014  . Essential hypertension 09/13/2014      Current Outpatient Medications:  .  Acetaminophen 500 MG coapsule, Take by mouth every 4 (four) hours as needed for fever., Disp: , Rfl:  .  amLODipine (NORVASC) 5 MG tablet, Take 1 tablet (5 mg total) by mouth daily., Disp: 90 tablet, Rfl: 3 .  aspirin EC 81 MG tablet, Take 81 mg by mouth daily. , Disp: , Rfl:  .  atorvastatin (LIPITOR) 20 MG tablet, Take 1 tablet (20 mg total) by mouth at bedtime., Disp: 90 tablet, Rfl: 2 .  Cholecalciferol (VITAMIN D3) 1000 units CAPS, Take by mouth daily. , Disp: , Rfl:  .  famotidine (PEPCID) 20 MG tablet, Take one tab once to twice daily as needed, Disp: 60 tablet, Rfl: 3 .  hydroxyurea (HYDREA) 500 MG capsule, Take 500 mg (1 capsule) on Monday, Wednesday, and Friday., Disp: 30 capsule, Rfl: 5 .  irbesartan (AVAPRO) 150 MG tablet, Take 1 tablet (150 mg total) by mouth daily., Disp: 90 tablet, Rfl: 3 .  ketoconazole (NIZORAL) 2 % shampoo, Apply 1 application topically 2 (two)  times a week. , Disp: , Rfl:  .  levothyroxine (SYNTHROID) 88 MCG tablet, TAKE 1 TABLET BY MOUTH ONCE DAILY BEFORE BREAKFAST (Patient taking differently: TAKE 1 TABLET BY MOUTH ONCE DAILY BEFORE BREAKFAST (says she is taking something by a different name but it is still .88 mcg)), Disp: 90 tablet, Rfl: 3 .  polyethylene glycol powder (GLYCOLAX/MIRALAX) powder, Take 1 Container by mouth once., Disp: , Rfl:    Allergies  Allergen Reactions  . Novocain [Procaine] Palpitations     Past Surgical History:  Procedure Laterality Date  . ADENOIDECTOMY    . APPENDECTOMY    . biopsy, right breast     . CATARACT EXTRACTION W/PHACO Left 03/25/2015   Procedure: CATARACT EXTRACTION PHACO AND INTRAOCULAR LENS PLACEMENT (IOC);  Surgeon: Estill Cotta, MD;  Location: [facility] ORS;  Service: Ophthalmology;  Laterality: Left;  [place] 02:09 AP% 27.3 CDE 59.97 fluid pack lot #[id number]  . CESAREAN SECTION     . COLON SURGERY    . JOINT REPLACEMENT Right    total hip  . THYROID LOBECTOMY    . THYROIDECTOMY     partial  . TONSILLECTOMY    . TOTAL HIP ARTHROPLASTY    . UPPER GI ENDOSCOPY  10/17/2008     Family History  Problem Relation Age of Onset  . Stroke Mother   . Stroke Father   . Dementia Maternal Grandfather      Social History   Tobacco Use  . Smoking status: Former Research scientist (life sciences)  . Smokeless tobacco: Never Used  Substance Use Topics  . Alcohol use: No    Alcohol/week: 0.0 standard drinks    With staff assistance, above reviewed with the patient today.  ROS: As per HPI, otherwise no specific complaints on a limited and focused system review   No results found for this or any previous visit (from the past 72 hour(s)).   PHQ2/9: Depression screen [facility] 2/9 09/13/2019 07/20/2019 05/23/2019 05/10/2019 05/03/2019  Decreased Interest 0 0 3 1 1   Down, Depressed, Hopeless 1 0 3 1 1   PHQ - 2 Score 1 0 6 2 2   Altered sleeping 1 0 3 0 0  Tired, decreased energy 1 0 3 0 0  Change in appetite 0 0 3 0 0  Feeling bad or failure about yourself  0 0 1 0 0  Trouble concentrating 1 0 0 0 0  Moving slowly or fidgety/restless 0 0 0 0 0  Suicidal thoughts 0 0 0 0 0  PHQ-9 Score 4 0 16 2 2   Difficult doing work/chores Somewhat difficult Not difficult at all Somewhat difficult Not difficult at all Not difficult at all  Some recent data might be hidden   PHQ-2/9 Result reviewed  Fall Risk: Fall Risk  09/13/2019 07/20/2019 05/10/2019 05/03/2019 04/28/2019  Falls in the past year? 1 1 0 0 0  Number falls in past yr: 0 0 0 0 0  Injury with Fall? 0 0 0 0 0  Follow up - Falls evaluation completed - - -      Objective:   Vitals:   09/13/19 1312  BP: 112/78  Pulse: 82  Resp: 16  Temp: 98.1 F (36.7 C)  TempSrc: Temporal  SpO2: 99%  Weight: 141 lb 8 oz (64.2 kg)  Height: 5\' 4"  (1.626 m)    Body mass index is 24.29 kg/m.  Physical Exam   NAD, masked, very pleasant HEENT - + glasses,  sclera anicteric, PERRL, + arcus,  EOMI, conj - non-inj'ed,  pharynx clear Neck - supple, no adenopathy, no TM, small scar present, carotids 2+ and = without bruits bilat Car - RRR without m/g/r Pulm- CTA without wheeze or rales Abd - soft, NT, ND, BS+, no obvious HSM, no masses Back - no CVA tenderness Ext - no LE edema, splint present on her left forearm, she removed the splint, and had adequate range of motion of the left wrist.  Grip strength was adequate bilaterally. Breast/pelvic - follows with ob/gyn  Neuro - affect was not flat, very appropriate with conversation, speech normal             Grossly non-focal with good strength on testing upper and lower ext's,  sensation intact to LT in distal extremities,  she was able to get up from the chair, get to the exam table, and with assistance get up the step onto the table (has a cane to help when she is walking, she says more for assurance)    Results for orders placed or performed in visit on 07/20/19  B12 and Folate Panel  Result Value Ref Range   Vitamin B-12 441 200 - 1,100 pg/mL   Folate 15.8 ng/mL  TEST AUTHORIZATION  Result Value Ref Range   TEST NAME: B12 AND FOLATE    TEST CODE: 7,065    CLIENT CONTACT: DR. Gretta Began    REPORT ALWAYS MESSAGE SIGNATURE     Last labs reviewed.    Assessment & Plan:   1. Essential hypertension Blood pressure remains well controlled on her medication regimen. Continue with her current medications. Continue checking them at home as she is doing as well.  2. Dyslipidemia Last lipid panel was good. Continue with her current medication, atorvastatin, as she is tolerating that to date.  3. Postoperative hypothyroidism Continue with her thyroid supplement,  ast TSH check was good.  4. Stage 3a chronic kidney disease Continuing to monitor, with last panel reviewed and completed the end of March. She follows with nephrology, and has a follow-up appointment with them in September.  (Sees them  every 6 months)  5. GERD without esophagitis Does well with the famotidine product as needed.  6. Essential thrombocytosis ([facility]) Continues to follow with oncology,  Continues on the hydroxyurea presently 3 times a week  7. Cervical radiculopathy Symptoms are adequately managed presently, discussed some potential next steps, and she would prefer to hold on any referrals or further studies at this point.  She is not taking any medicines for this presently, and states she really does not need any to help presently.  8. Paresthesia of upper and lower extremities of both sides Persistent, and as above, does not want to have any further studies or referrals at this point. Her vitamin B-12 and folate levels were normal on recent check.  9. Grieving 10. Anxiety with depression She was very upbeat today on our visit, and is doing well presently handling the death of her husband, and being moved into the assisted living facility soon.  She remains very positive about this move,  11. De Quervain's tenosynovitis, left She notes the splint has been very helpful and will continue to utilize. Continue to monitor presently.  She was due for the PPV 23 vaccination, and felt reasonable to proceed with this.  She did as well. She has had both doses of a Covid vaccine in the past.  Felt best to have a follow-up in 3 months time, and likely will need to check her metabolic panel at that point  depending if nephrology has checked on their follow-up in September. Follow-up sooner as needed.    Towanda Malkin, MD 09/13/19 1:59 PM

## 2019-09-15 ENCOUNTER — Other Ambulatory Visit: Payer: Medicare HMO

## 2019-09-15 ENCOUNTER — Ambulatory Visit: Payer: Medicare HMO | Admitting: Oncology

## 2019-09-18 ENCOUNTER — Other Ambulatory Visit: Payer: Self-pay

## 2019-09-18 ENCOUNTER — Encounter: Payer: Self-pay | Admitting: Obstetrics & Gynecology

## 2019-09-18 ENCOUNTER — Ambulatory Visit: Payer: Medicare HMO | Admitting: Obstetrics & Gynecology

## 2019-09-18 VITALS — BP 130/80 | Ht 64.0 in | Wt 142.0 lb

## 2019-09-18 DIAGNOSIS — N814 Uterovaginal prolapse, unspecified: Secondary | ICD-10-CM | POA: Diagnosis not present

## 2019-09-18 DIAGNOSIS — Z111 Encounter for screening for respiratory tuberculosis: Secondary | ICD-10-CM

## 2019-09-18 NOTE — Progress Notes (Signed)
Patient needs TB test to move into assisted living facility.

## 2019-09-18 NOTE — Progress Notes (Signed)
HPI:      [name] is a 86 y.o. [phone] who presents today for her pessary follow up and examination related to her pelvic floor weakening.  Pt reports tolerating the pessary well with no vaginal bleeding and no vaginal discharge.  Symptoms of pelvic floor weakening have greatly improved. She is voiding and defecating without difficulty. She currently has a Ring#3 pessary.  PMHx: She  has a past medical history of Breast mass in female, Cataract, Difficult intubation (03/03/2016), Diverticulitis, Diverticulosis (05/28/2015), Essential thrombocytosis ([place]) (07/23/2015), Genital prolapse, GERD (gastroesophageal reflux disease), Rosanna Randy syndrome (06/30/2016), Hyperlipemia, Hyperlipidemia, Hypothyroidism, Mild atherosclerosis of carotid artery (06/25/2015), Morton's neuroma, MRI contraindicated due to metal implant, Prolapse of uterus, Radiculopathy, Renal cyst, Rosacea, Shingles, and Thrombocytosis ([place]). Also,  has a past surgical history that includes Tonsillectomy; Appendectomy; Cesarean section; Joint replacement (Right); Thyroidectomy; Colon surgery; Cataract extraction w/PHACO (Left, 03/25/2015); Adenoidectomy; biopsy, right breast ; Total hip arthroplasty; Thyroid lobectomy; and Upper gi endoscopy (10/17/2008)., family history includes Dementia in her maternal grandfather; Stroke in her father and mother.,  reports that she has quit smoking. She has never used smokeless tobacco. She reports that she does not drink alcohol and does not use drugs.  She has a current medication list which includes the following prescription(s): acetaminophen, amlodipine, aspirin ec, atorvastatin, vitamin d3, famotidine, hydroxyurea, irbesartan, ketoconazole, levothyroxine, and polyethylene glycol powder. Also, is allergic to novocain [procaine].  Review of Systems  All other systems reviewed and are negative.   Objective: There were no vitals taken for this visit. Physical Exam Constitutional:      General: She is  not in acute distress.    Appearance: She is well-developed.  Genitourinary:     Pelvic exam was performed with patient supine.     Vagina, cervix, uterus, right adnexa and left adnexa normal.     No vaginal erythema or bleeding.     Uterus is not enlarged.     Uterus is midaxial and regular.     Genitourinary Comments: Gr 2 uterine prolapse Gr 2 cystocele       HENT:     Head: Normocephalic and atraumatic.     Nose: Nose normal.  Abdominal:     General: There is no distension.     Palpations: Abdomen is soft.     Tenderness: There is no abdominal tenderness.  Musculoskeletal:        General: Normal range of motion.  Neurological:     Mental Status: She is alert and oriented to person, place, and time.     Cranial Nerves: No cranial nerve deficit.  Skin:    General: Skin is warm and dry.  Psychiatric:        Attention and Perception: Attention normal.        Mood and Affect: Mood normal.        Speech: Speech normal.        Behavior: Behavior normal.        Cognition and Memory: Cognition normal.        Judgment: Judgment normal.    Pessary Care Pessary removed and cleaned.  Vagina checked - without erosions - pessary replaced.  A/P:   ICD-10-CM   1. Uterine prolapse  N81.4   2. Cystocele with prolapse  N81.4    Pessary was cleaned and replaced today. Instructions given for care. Concerning symptoms to observe for are counseled to patient. Follow up scheduled for 3 months.  A total of 20 minutes were spent face-to-face  with the patient as well as preparation, review, communication, and documentation during this encounter.   Barnett Applebaum, MD, [facility], [facility] 09/18/2019  1:21 PM

## 2019-09-20 LAB — QUANTIFERON-TB GOLD PLUS
Mitogen-NIL: 9.14 IU/mL
NIL: 0.05 IU/mL
QuantiFERON-TB Gold Plus: NEGATIVE
TB1-NIL: <0 IU/mL
TB2-NIL: 0 IU/mL

## 2019-09-21 ENCOUNTER — Telehealth: Payer: Self-pay

## 2019-09-21 NOTE — Telephone Encounter (Signed)
Copied from [place] [id number]. Topic: General - Other >> Sep 21, 2019 12:11 PM Rainey Pines A wrote: Patient stated that [facility] assisted living need the paperwork from Dr. Roxan Hockey as soon as possbile. Please advise

## 2019-09-24 NOTE — Progress Notes (Signed)
[place]  [phone] [phone]  ID: Gina [name] OB: [id number]  MR#: [id number]  MLY#:[id number]  Patient Care Team: Towanda Malkin, MD as PCP - General (Internal Medicine) Murlean Iba, MD (Internal Medicine) Gae Dry, MD as Referring Physician (Obstetrics and Gynecology) Estill Cotta, MD (Ophthalmology) Anabel Bene, MD as Referring Physician (Neurology) Beverly Gust, MD (Otolaryngology) Brendolyn Patty, MD (Dermatology) Lloyd Huger, MD as Consulting Physician (Oncology)  CHIEF COMPLAINT: Essential thrombocytosis  INTERVAL HISTORY: Patient returns to clinic today for repeat laboratory work and routine 67-month evaluation.  After the death of her husband recently, she moved into assisted living and is doing well.  She continues to tolerate Hydrea without significant side effects.  She does not complain of weakness or fatigue today.  She has no neurologic complaints.  She denies any recent fevers or illnesses.  She has a good appetite and denies weight loss.  She denies any chest pain, shortness of breath, cough, or hemoptysis.  She denies any nausea, vomiting, constipation, or diarrhea.  She has no urinary complaints.  Patient offers no specific complaints today.  REVIEW OF SYSTEMS:   Review of Systems  Constitutional: Negative.  Negative for fever, malaise/fatigue and weight loss.  Respiratory: Negative.  Negative for cough, hemoptysis and shortness of breath.   Cardiovascular: Negative.  Negative for chest pain and leg swelling.  Gastrointestinal: Negative.  Negative for abdominal pain.  Genitourinary: Negative.  Negative for dysuria.  Musculoskeletal: Negative.  Negative for back pain.  Skin: Negative.  Negative for rash.  Neurological: Negative.  Negative for focal weakness, weakness and headaches.  Psychiatric/Behavioral: Negative.  The patient is not nervous/anxious.     As per HPI. Otherwise,  a complete review of systems is negative.  PAST MEDICAL HISTORY: Past Medical History:  Diagnosis Date  . Breast mass in female    right breast  . Cataract   . Difficult intubation 03/03/2016   September 06, 2013; left nasal fiberoptic intubation #7 ETT; see letter from Dr. Loanne [name], Dept of Anesthesiology, [facility]  . Diverticulitis   . Diverticulosis 05/28/2015  . Essential thrombocytosis ([place]) 07/23/2015  . Genital prolapse   . GERD (gastroesophageal reflux disease)   . Gina [name] 06/30/2016   Confirmed by Dr. Mike [name]  . Hyperlipemia   . Hyperlipidemia    history of   . Hypothyroidism   . Mild atherosclerosis of carotid artery 06/25/2015  . Morton's neuroma   . MRI contraindicated due to metal implant   . Prolapse of uterus   . Radiculopathy   . Renal cyst   . Rosacea   . Shingles   . Thrombocytosis ([place])     PAST SURGICAL HISTORY: Past Surgical History:  Procedure Laterality Date  . ADENOIDECTOMY    . APPENDECTOMY    . biopsy, right breast     . CATARACT EXTRACTION W/PHACO Left 03/25/2015   Procedure: CATARACT EXTRACTION PHACO AND INTRAOCULAR LENS PLACEMENT (IOC);  Surgeon: Estill Cotta, MD;  Location: [facility] ORS;  Service: Ophthalmology;  Laterality: Left;  [place] 02:09 AP% 27.3 CDE 59.97 fluid pack lot #[id number]  . CESAREAN SECTION    . COLON SURGERY    . JOINT REPLACEMENT Right    total hip  . THYROID LOBECTOMY    . THYROIDECTOMY     partial  . TONSILLECTOMY    . TOTAL HIP ARTHROPLASTY    . UPPER GI ENDOSCOPY  10/17/2008    FAMILY HISTORY: Family History  Problem Relation Age of  Onset  . Stroke Mother   . Stroke Father   . Dementia Maternal Grandfather     ADVANCED DIRECTIVES (Y/N):  N  HEALTH MAINTENANCE: Social History   Tobacco Use  . Smoking status: Former Research scientist (life sciences)  . Smokeless tobacco: Never Used  Vaping Use  . Vaping Use: Never used  Substance Use Topics  . Alcohol use: No    Alcohol/week: 0.0 standard drinks  . Drug use: No      Colonoscopy:  PAP:  Bone density:  Lipid panel:  Allergies  Allergen Reactions  . Novocain [Procaine] Palpitations    Current Outpatient Medications  Medication Sig Dispense Refill  . Acetaminophen 500 MG coapsule Take by mouth every 4 (four) hours as needed for fever.    Marland Kitchen amLODipine (NORVASC) 5 MG tablet Take 1 tablet (5 mg total) by mouth daily. 90 tablet 3  . aspirin EC 81 MG tablet Take 81 mg by mouth daily.     Marland Kitchen atorvastatin (LIPITOR) 20 MG tablet Take 1 tablet (20 mg total) by mouth at bedtime. 90 tablet 2  . Cholecalciferol (VITAMIN D3) 1000 units CAPS Take by mouth daily.     . hydroxyurea (HYDREA) 500 MG capsule Take 500 mg (1 capsule) on Monday, Wednesday, and Friday. 30 capsule 5  . irbesartan (AVAPRO) 150 MG tablet Take 1 tablet (150 mg total) by mouth daily. 90 tablet 3  . ketoconazole (NIZORAL) 2 % shampoo Apply 1 application topically 2 (two) times a week.     . levothyroxine (SYNTHROID) 88 MCG tablet TAKE 1 TABLET BY MOUTH ONCE DAILY BEFORE BREAKFAST (Patient taking differently: TAKE 1 TABLET BY MOUTH ONCE DAILY BEFORE BREAKFAST (says she is taking something by a different name but it is still .88 mcg)) 90 tablet 3  . famotidine (PEPCID) 20 MG tablet Take one tab once to twice daily as needed 60 tablet 3  . polyethylene glycol powder (GLYCOLAX/MIRALAX) powder Take 1 Container by mouth once.     No current facility-administered medications for this visit.    OBJECTIVE: Vitals:   09/29/19 1444  BP: 124/75  Pulse: 82  Temp: 98.2 F (36.8 C)  SpO2: 97%     Body mass index is 24.61 kg/m.    ECOG FS:0 - Asymptomatic  General: Well-developed, well-nourished, no acute distress. Eyes: [name] conjunctiva, anicteric sclera. HEENT: Normocephalic, moist mucous membranes. Lungs: No audible wheezing or coughing. Heart: Regular rate and rhythm. Abdomen: Soft, nontender, no obvious distention. Musculoskeletal: No edema, cyanosis, or clubbing. Neuro: Alert, answering  all questions appropriately. Cranial nerves grossly intact. Skin: No rashes or petechiae noted. Psych: Normal affect.   LAB RESULTS:  Lab Results  Component Value Date   NA 132 (L) 05/23/2019   K 4.8 05/23/2019   CL 97 (L) 05/23/2019   CO2 24 05/23/2019   GLUCOSE 144 (H) 05/23/2019   BUN 24 05/23/2019   CREATININE 1.17 (H) 05/23/2019   CALCIUM 9.7 05/23/2019   PROT 7.4 03/16/2019   ALBUMIN 4.3 02/28/2018   AST 26 03/16/2019   ALT 25 03/16/2019   ALKPHOS 74 02/28/2018   BILITOT 1.3 (H) 03/16/2019   GFRNONAA 41 (L) 05/23/2019   GFRAA 48 (L) 05/23/2019    Lab Results  Component Value Date   WBC 10.2 09/29/2019   NEUTROABS 7.6 09/29/2019   HGB 13.8 09/29/2019   HCT 40.6 09/29/2019   MCV 91.6 09/29/2019   PLT 595 (H) 09/29/2019     STUDIES: No results found.  ASSESSMENT: Essential thrombocytosis  PLAN:  1. Essential thrombocytosis: Since decreasing patient's Hydrea dose to 3 days a week on Monday, Wednesday, and Friday, her platelet count remains persistently elevated at 595.  Patient has agreed to reinitiate treatment 4 days a week on Monday, Wednesday, Friday, and Saturday. Continue 81 mg of aspirin.  No further interventions are needed.  Return to clinic in 3 months for laboratory work only and then in 6 months for laboratory work and further evaluation. 2.  Renal insufficiency: Mild, monitor.  Continue follow-up with nephrology as scheduled. 3.  Leukocytosis: Resolved.  I spent a total of 20 minutes reviewing chart data, face-to-face evaluation with the patient, counseling and coordination of care as detailed above.    Patient expressed understanding and was in agreement with this plan. She also understands that She can call clinic at any time with any questions, concerns, or complaints.    Lloyd Huger, MD   09/30/2019 8:14 AM

## 2019-09-28 ENCOUNTER — Other Ambulatory Visit: Payer: Self-pay | Admitting: *Deleted

## 2019-09-28 DIAGNOSIS — D473 Essential (hemorrhagic) thrombocythemia: Secondary | ICD-10-CM

## 2019-09-29 ENCOUNTER — Inpatient Hospital Stay: Payer: Medicare HMO

## 2019-09-29 ENCOUNTER — Other Ambulatory Visit: Payer: Self-pay

## 2019-09-29 ENCOUNTER — Inpatient Hospital Stay: Payer: Medicare HMO | Attending: Oncology | Admitting: Oncology

## 2019-09-29 VITALS — BP 124/75 | HR 82 | Temp 98.2°F | Wt 143.4 lb

## 2019-09-29 DIAGNOSIS — N289 Disorder of kidney and ureter, unspecified: Secondary | ICD-10-CM | POA: Diagnosis not present

## 2019-09-29 DIAGNOSIS — D473 Essential (hemorrhagic) thrombocythemia: Secondary | ICD-10-CM

## 2019-09-29 DIAGNOSIS — Z87891 Personal history of nicotine dependence: Secondary | ICD-10-CM | POA: Insufficient documentation

## 2019-09-29 LAB — CBC WITH DIFFERENTIAL/PLATELET
Abs Immature Granulocytes: 0.04 10*3/uL (ref 0.00–0.07)
Basophils Absolute: 0.1 10*3/uL (ref 0.0–0.1)
Basophils Relative: 1 %
Eosinophils Absolute: 0.2 10*3/uL (ref 0.0–0.5)
Eosinophils Relative: 2 %
HCT: 40.6 % (ref 36.0–46.0)
Hemoglobin: 13.8 g/dL (ref 12.0–15.0)
Immature Granulocytes: 0 %
Lymphocytes Relative: 15 %
Lymphs Abs: 1.6 10*3/uL (ref 0.7–4.0)
MCH: 31.2 pg (ref 26.0–34.0)
MCHC: 34 g/dL (ref 30.0–36.0)
MCV: 91.6 fL (ref 80.0–100.0)
Monocytes Absolute: 0.8 10*3/uL (ref 0.1–1.0)
Monocytes Relative: 8 %
Neutro Abs: 7.6 10*3/uL (ref 1.7–7.7)
Neutrophils Relative %: 74 %
Platelets: 595 10*3/uL — ABNORMAL HIGH (ref 150–400)
RBC: 4.43 MIL/uL (ref 3.87–5.11)
RDW: 13.9 % (ref 11.5–15.5)
WBC: 10.2 10*3/uL (ref 4.0–10.5)
nRBC: 0 % (ref 0.0–0.2)

## 2019-10-02 DIAGNOSIS — R69 Illness, unspecified: Secondary | ICD-10-CM | POA: Diagnosis not present

## 2019-10-13 ENCOUNTER — Telehealth: Payer: Self-pay

## 2019-10-13 NOTE — Telephone Encounter (Signed)
Returned call, no answer, left vm.   Copied from [place] [id number]. Topic: General - Other >> Oct 13, 2019 11:45 AM Keene Breath wrote: Reason for CRM: Patient would like the nurse to call her regarding the Booster vaccine.  CB# [phone]

## 2019-10-23 ENCOUNTER — Telehealth: Payer: Self-pay

## 2019-10-23 NOTE — Telephone Encounter (Signed)
Patient's medication list updated.

## 2019-11-01 DIAGNOSIS — M79675 Pain in left toe(s): Secondary | ICD-10-CM | POA: Diagnosis not present

## 2019-11-01 DIAGNOSIS — L603 Nail dystrophy: Secondary | ICD-10-CM | POA: Diagnosis not present

## 2019-11-01 DIAGNOSIS — M79674 Pain in right toe(s): Secondary | ICD-10-CM | POA: Diagnosis not present

## 2019-11-08 DIAGNOSIS — E871 Hypo-osmolality and hyponatremia: Secondary | ICD-10-CM | POA: Diagnosis not present

## 2019-11-08 DIAGNOSIS — N281 Cyst of kidney, acquired: Secondary | ICD-10-CM | POA: Diagnosis not present

## 2019-11-08 DIAGNOSIS — N1832 Chronic kidney disease, stage 3b: Secondary | ICD-10-CM | POA: Diagnosis not present

## 2019-11-08 DIAGNOSIS — E875 Hyperkalemia: Secondary | ICD-10-CM | POA: Diagnosis not present

## 2019-11-09 ENCOUNTER — Telehealth: Payer: Self-pay | Admitting: Internal Medicine

## 2019-11-09 NOTE — Telephone Encounter (Signed)
Lattie Haw with [facility] is calling to ask for an order for patient to get ear drops for wax build up in her ear.  Their fax number is [phone]

## 2019-11-09 NOTE — Telephone Encounter (Signed)
Please provide verbal that patient can use OTC ear wax removal kit with drops in the kit to use, or OTC carbamide peroxide drops to help. These are OTC meds and not need prescription.

## 2019-11-09 NOTE — Telephone Encounter (Signed)
The below info was faxed

## 2019-11-14 NOTE — Progress Notes (Signed)
Patient ID: Gina [name], female    DOB: 11/16/1926, 86 y.o.   MRN: [id number]  PCP: Towanda Malkin, MD  Chief Complaint  Patient presents with  . Follow-up    Subjective:   [name] Gina is a 86 y.o. female, presents to clinic with CC of the following:  Chief Complaint  Patient presents with  . Follow-up    HPI:  Patient is a 86 year old female Last visit with me was in July 2021 Follows up today. She now currently resides in [facility] assisted living    CKD - stage 3, renal cyst, HTN    Continues to follow with nephrology, last visit 11/08/2019 with the following assessment/plan:  1. Stage 3b chronic kidney disease (Gina [name]) Underlying CKD likely secondary to hypertension and atherosclerosis Continued on aspirin, atorvastatin and irbesartan for cardiovascular risk reduction Most recent labs from June 2 show creatinine of 1.10, GFR 44 Previously in March 16, 2019, Creatinine was 1.23, GFR 39 Overall progression appears to be slow  2. Hyperkalemia Continue low potassium diet Continue to monitor Last potassium of 5.2 in June 2021  3. Renal cyst Large right renal cyst was seen on CT in 2016 Appears to be benign  4. Hyponatremia Last sodium 133 in June 2021 Continue to monitor   Labs from that visit included a creatinine of 1.31, GFR of 36 and a sodium of 134, potassium was normal at 4.8  HTN Medication regimen regimen-amlodipine 5mg  daily and irbesartan 150mg  daily.  Takes medications as prescribed, not missing doses routinely. She occasionallychecks blood pressures at home with range of130's/70's, high is 150 Denies chest pain,palp's, SOB, increasedheadaches.  BP Readings from Last 3 Encounters:  11/15/19 (!) 128/58  09/29/19 124/75  09/18/19 (!) 130/80     Hyperlipidemia Medication regimen-atorvastatin 20mg  nightly. Takes medications as prescribed. Denies myalgias  Recent Labs       Lab Results  Component Value Date    CHOL 152 03/16/2019   HDL 52 03/16/2019   LDLCALC 76 03/16/2019   TRIG 141 03/16/2019   CHOLHDL 2.9 03/16/2019      Wt Readings from Last 3 Encounters:  11/15/19 145 lb 6.4 oz (66 kg)  09/29/19 143 lb 6.4 oz (65 kg)  09/18/19 142 lb (64.4 kg)     Hypothyroidism, h/o partial thyroidectomy Medication regimen-levothryoxine.Has been on this dose formanyyears.  Lab Results  Component Value Date   TSH 1.89 03/16/2019     Thrombocytosis/leukocytosis Follows up with Dr. Varney Biles visit 09/29/2019 with the following recommendations:  1. Essential thrombocytosis: Since decreasing patient's Hydrea dose to 3 days a week on Monday, Wednesday, and Friday, her platelet count remains persistently elevated at 595.  Patient has agreed to reinitiate treatment 4 days a week on Monday, Wednesday, Friday, and Saturday. Continue 81 mg of aspirin.  No further interventions are needed.  Return to clinic in 3 months for laboratory work only and then in 6 months for laboratory work and further evaluation. 2.  Renal insufficiency: Mild, monitor.  Continue follow-up with nephrology as scheduled. 3.  Leukocytosis: Resolved.  Lab Results  Component Value Date   WBC 10.2 09/29/2019   HGB 13.8 09/29/2019   HCT 40.6 09/29/2019   MCV 91.6 09/29/2019   PLT 595 (H) 09/29/2019     GERD: Takes pepcid prn and helpful, about 2-3 times per week at present  Cervical radiculopathy/ Paresthesias Takes tylenol PRN in past, not take anything presently. Notes she still gets numbness at the tips of her  fingers fairly frequently Noted not limiting her activities presently and is staying active with exercises. Discussed potentially a trial of a gabapentin type product, and she wanted to hold off on that today, as it was not anxious to add another medicine to her regimen. Discussed potential referral or other studies to further assess previously, and she continues to not be interested in that  presently.    OB-GYN follow-up: She continues to follow-up with OB/GYN forUterine prolapseand aCystocele with prolapsewith thePessary cleaned and replaced. Last visit 09/18/2019 Prior breast biopsy 05/2018 - f/u mammography order recently placed,   Continue to follow with Dr. Kenton [name]   Cataracts, s/p surgery:  ophth continues to follow. She noted more difficulty with vision from right eye now and was wanting to have an elective procedure (cataract on right), was delayed due to Covid.  Deferred in the more recent past and given new glasses which are very helpful   left hand numbness and wrist pain -  felt to have an element of de Quervain's tenosynovitis,still hurts at times, notes the splint really helps and she continues to wear fairly regularly and managing well with the splint.              Anxiety with depression/grieving             She notes she has been handling things very well after her husband's death, has moved to an assisted living facility phq9 reviewed.   Patient Active Problem List   Diagnosis Date Noted  . Degenerative disc disease, cervical 05/10/2019  . De Quervain's tenosynovitis, left 05/10/2019  . Bilateral hand numbness 05/10/2019  . DNR (do not resuscitate) 04/22/2018  . Venous stasis 04/21/2018  . Cystocele with prolapse 04/01/2017  . Hemorrhoids 07/29/2016  . Gilbert syndrome 06/30/2016  . Essential thrombocytosis ([place]) 07/23/2015  . Mild atherosclerosis of carotid artery 06/25/2015  . Dyslipidemia 06/03/2015  . Diverticulosis 05/28/2015  . GERD without esophagitis 02/06/2015  . Neuropathy 01/31/2015  . Cervical radiculopathy 01/31/2015  . Stage 3b chronic kidney disease 10/06/2014  . Renal cyst 10/06/2014  . Uterine prolapse 10/06/2014  . Cervical radiculopathy due to degenerative joint disease of spine 10/06/2014  . History of right hip replacement 10/06/2014  . Hypothyroidism 10/05/2014  . Essential hypertension 09/13/2014       Current Outpatient Medications:  .  Acetaminophen 500 MG coapsule, Take by mouth every 4 (four) hours as needed for fever., Disp: , Rfl:  .  amLODipine (NORVASC) 5 MG tablet, Take 1 tablet (5 mg total) by mouth daily., Disp: 90 tablet, Rfl: 3 .  aspirin EC 81 MG tablet, Take 81 mg by mouth daily. , Disp: , Rfl:  .  atorvastatin (LIPITOR) 20 MG tablet, Take 1 tablet (20 mg total) by mouth at bedtime., Disp: 90 tablet, Rfl: 2 .  Cholecalciferol (VITAMIN D3) 1000 units CAPS, Take by mouth daily. , Disp: , Rfl:  .  famotidine (PEPCID) 20 MG tablet, Take one tab once to twice daily as needed, Disp: 60 tablet, Rfl: 3 .  hydroxyurea (HYDREA) 500 MG capsule, Take 500 mg (1 capsule) on Monday, Wednesday, and Friday., Disp: 30 capsule, Rfl: 5 .  irbesartan (AVAPRO) 150 MG tablet, Take 1 tablet (150 mg total) by mouth daily., Disp: 90 tablet, Rfl: 3 .  ketoconazole (NIZORAL) 2 % shampoo, Apply 1 application topically 2 (two) times a week. , Disp: , Rfl:  .  levothyroxine (SYNTHROID) 88 MCG tablet, TAKE 1 TABLET BY MOUTH ONCE DAILY BEFORE BREAKFAST (  Patient taking differently: TAKE 1 TABLET BY MOUTH ONCE DAILY BEFORE BREAKFAST (says she is taking something by a different name but it is still .88 mcg)), Disp: 90 tablet, Rfl: 3 .  Polyethyl Glycol-Propyl Glycol (SYSTANE) 0.4-0.3 % SOLN, Apply to eye., Disp: , Rfl:    Allergies  Allergen Reactions  . Novocain [Procaine] Palpitations     Past Surgical History:  Procedure Laterality Date  . ADENOIDECTOMY    . APPENDECTOMY    . biopsy, right breast     . CATARACT EXTRACTION W/PHACO Left 03/25/2015   Procedure: CATARACT EXTRACTION PHACO AND INTRAOCULAR LENS PLACEMENT (IOC);  Surgeon: Estill Cotta, MD;  Location: [facility] ORS;  Service: Ophthalmology;  Laterality: Left;  [place] 02:09 AP% 27.3 CDE 59.97 fluid pack lot #[id number]  . CESAREAN SECTION    . COLON SURGERY    . JOINT REPLACEMENT Right    total hip  . THYROID LOBECTOMY    . THYROIDECTOMY      partial  . TONSILLECTOMY    . TOTAL HIP ARTHROPLASTY    . UPPER GI ENDOSCOPY  10/17/2008     Family History  Problem Relation Age of Onset  . Stroke Mother   . Stroke Father   . Dementia Maternal Grandfather      Social History   Tobacco Use  . Smoking status: Former Research scientist (life sciences)  . Smokeless tobacco: Never Used  Substance Use Topics  . Alcohol use: No    Alcohol/week: 0.0 standard drinks    With staff assistance, above reviewed with the patient today.  ROS: As per HPI, otherwise no specific complaints on a limited and focused system review   No results found for this or any previous visit (from the past 72 hour(s)).   PHQ2/9: Depression screen [facility] 2/9 11/15/2019 09/13/2019 07/20/2019 05/23/2019 05/10/2019  Decreased Interest 0 0 0 3 1  Down, Depressed, Hopeless 0 1 0 3 1  PHQ - 2 Score 0 1 0 6 2  Altered sleeping - 1 0 3 0  Tired, decreased energy - 1 0 3 0  Change in appetite - 0 0 3 0  Feeling bad or failure about yourself  - 0 0 1 0  Trouble concentrating - 1 0 0 0  Moving slowly or fidgety/restless - 0 0 0 0  Suicidal thoughts - 0 0 0 0  PHQ-9 Score - 4 0 16 2  Difficult doing work/chores - Somewhat difficult Not difficult at all Somewhat difficult Not difficult at all  Some recent data might be hidden   PHQ-2/9 Result is neg  Fall Risk: Fall Risk  11/15/2019 09/13/2019 07/20/2019 05/10/2019 05/03/2019  Falls in the past year? 0 1 1 0 0  Number falls in past yr: 0 0 0 0 0  Injury with Fall? 0 0 0 0 0  Follow up Falls evaluation completed - Falls evaluation completed - -      Objective:   Vitals:   11/15/19 1312  BP: (!) 128/58  Pulse: 88  Resp: 16  Temp: 98.3 F (36.8 C)  TempSrc: Oral  SpO2: 95%  Weight: 145 lb 6.4 oz (66 kg)  Height: 5\' 4"  (1.626 m)    Body mass index is 24.96 kg/m.  Physical Exam    NAD,masked, very pleasant HEENT -+ glasses,sclera anicteric, PERRL,+ arcus,EOMI, conj - non-inj'ed,  left TM and canal clear, right canal  had faint cerumen just at the entrance, with some minimal erythema(she noted she had used a Q-tip very recently) - not  marked, and no cerumen impaction noted, TM was clear, pharynx clear Neck - supple, no adenopathy, no TM,small scar present,carotids 2+ and = without bruits bilat Car - RRR without m/g/r Pulm- CTA without wheeze or rales Abd - soft, NT diffusely, ND, Back - no CVA tenderness Ext - no LE edema, splint present on her left forearm,  grip adequate Breast/pelvic - follows with ob/gyn Neuro - affect was not flat,veryappropriate with conversation, speech normal she was able to get up from the chair, has a cane to help when she is walking, with balance good when walking in the office with the cane to to help.  Results for orders placed or performed in visit on 09/29/19  CBC with Differential/Platelet  Result Value Ref Range   WBC 10.2 4.0 - 10.5 K/uL   RBC 4.43 3.87 - 5.11 MIL/uL   Hemoglobin 13.8 12.0 - 15.0 g/dL   HCT 40.6 36 - 46 %   MCV 91.6 80.0 - 100.0 fL   MCH 31.2 26.0 - 34.0 pg   MCHC 34.0 30.0 - 36.0 g/dL   RDW 13.9 11.5 - 15.5 %   Platelets 595 (H) 150 - 400 K/uL   nRBC 0.0 0.0 - 0.2 %   Neutrophils Relative % 74 %   Neutro Abs 7.6 1.7 - 7.7 K/uL   Lymphocytes Relative 15 %   Lymphs Abs 1.6 0.7 - 4.0 K/uL   Monocytes Relative 8 %   Monocytes Absolute 0.8 0 - 1 K/uL   Eosinophils Relative 2 %   Eosinophils Absolute 0.2 0 - 0 K/uL   Basophils Relative 1 %   Basophils Absolute 0.1 0 - 0 K/uL   Immature Granulocytes 0 %   Abs Immature Granulocytes 0.04 0.00 - 0.07 K/uL   Last labs from [facility] also reviewed    Assessment & Plan:    1. Stage 3b chronic kidney disease Continues to follow with nephrology, just saw them a few days ago. Has had subtle progression of her kidney function, although they noted that is slow and continuing to monitor.  2. Essential hypertension Blood pressure has been good on home checks, and I question if at times it  may be lower than desired.  She did note it does get as high as 595 systolic at home, and has not had readings that have been particularly low noted. We will continue to monitor presently, and continue her blood pressure medication regimen  3. Mixed hyperlipidemia Remains on a statin, and last lipid panel in January, Will recheck again about a year from the last, on her next follow-up visit.  4. Postoperative hypothyroidism Last TSH check in January and was okay Continues on a supplement  5. GERD without esophagitis Has remained stable on the Pepcid product, not using daily presently (As she notes her diet has been better)  6. Essential thrombocytosis ([facility]) Continues to follow with oncology Currently taking Hydrea  7. De Quervain's tenosynovitis, left Notes she has done quite well utilizing the splint, and will continue to do so.  8. Cervical radiculopathy Concern with some of the tingling in her hands that has persisted, and discussed possible addition of a gabapentin product today.  She did not want to add another medicine presently. Emphasized the importance of staying active with neck and shoulder range of motion exercises routinely, and she states she has been doing so. We will continue to monitor presently and if symptoms are more problematic, she noted she would agree to starting the medication.  9. Anxiety with depression She has done fairly well after her husband's death, and notes she has been managing the stresses well with the transition to the assisted living facility. Continue to monitor presently.  10. Uterine prolapse Continues to follow with OB/GYN  11. Cerumen in the auditory canal Noted to her today there was not significant impaction concerns, and do not feel that trying to irrigate the canal would be helpful.  Actually may cause some increased symptoms after doing so. We will prescribe a Debrox eardrop, and can use as needed if having some mild wax  accumulation concerns.    Tentatively, we will schedule follow-up in 4 months time, sooner as needed.  We will plan to check her TSH and lipid panel on next follow-up as well as other labs that may be needed.  She did get the flu shot today as well.     Towanda Malkin, MD 11/15/19 1:22 PM

## 2019-11-15 ENCOUNTER — Other Ambulatory Visit: Payer: Self-pay

## 2019-11-15 ENCOUNTER — Ambulatory Visit (INDEPENDENT_AMBULATORY_CARE_PROVIDER_SITE_OTHER): Payer: Medicare HMO | Admitting: Internal Medicine

## 2019-11-15 ENCOUNTER — Encounter: Payer: Self-pay | Admitting: Internal Medicine

## 2019-11-15 VITALS — BP 128/58 | HR 88 | Temp 98.3°F | Resp 16 | Ht 64.0 in | Wt 145.4 lb

## 2019-11-15 DIAGNOSIS — F418 Other specified anxiety disorders: Secondary | ICD-10-CM

## 2019-11-15 DIAGNOSIS — N814 Uterovaginal prolapse, unspecified: Secondary | ICD-10-CM

## 2019-11-15 DIAGNOSIS — D473 Essential (hemorrhagic) thrombocythemia: Secondary | ICD-10-CM | POA: Diagnosis not present

## 2019-11-15 DIAGNOSIS — E782 Mixed hyperlipidemia: Secondary | ICD-10-CM | POA: Diagnosis not present

## 2019-11-15 DIAGNOSIS — K219 Gastro-esophageal reflux disease without esophagitis: Secondary | ICD-10-CM | POA: Diagnosis not present

## 2019-11-15 DIAGNOSIS — R69 Illness, unspecified: Secondary | ICD-10-CM | POA: Diagnosis not present

## 2019-11-15 DIAGNOSIS — I10 Essential (primary) hypertension: Secondary | ICD-10-CM

## 2019-11-15 DIAGNOSIS — M654 Radial styloid tenosynovitis [de Quervain]: Secondary | ICD-10-CM

## 2019-11-15 DIAGNOSIS — Z23 Encounter for immunization: Secondary | ICD-10-CM | POA: Diagnosis not present

## 2019-11-15 DIAGNOSIS — H6120 Impacted cerumen, unspecified ear: Secondary | ICD-10-CM

## 2019-11-15 DIAGNOSIS — M5412 Radiculopathy, cervical region: Secondary | ICD-10-CM

## 2019-11-15 DIAGNOSIS — E890 Postprocedural hypothyroidism: Secondary | ICD-10-CM

## 2019-11-15 DIAGNOSIS — N1832 Chronic kidney disease, stage 3b: Secondary | ICD-10-CM

## 2019-11-15 MED ORDER — DEBROX 6.5 % OT SOLN: 5.0000 [drp] | Freq: Two times a day (BID) | OTIC | 3 refills | Status: DC

## 2019-11-15 NOTE — Patient Instructions (Signed)
The form was completed to help with the fall risk assessment being done  I did order eardrops to your pharmacy to use as needed with any concerns for wax buildup in the ear canals

## 2019-11-15 NOTE — Addendum Note (Signed)
Addended by: Lennie Muckle on: 11/15/2019 02:33 PM   Modules accepted: Orders

## 2019-11-17 ENCOUNTER — Telehealth: Payer: Self-pay | Admitting: Internal Medicine

## 2019-11-17 NOTE — Telephone Encounter (Signed)
Called to inform the doctor that patient requested in home care to begin on Monday.  Any questions, please call at [phone], ext. [place]

## 2019-11-20 DIAGNOSIS — M4722 Other spondylosis with radiculopathy, cervical region: Secondary | ICD-10-CM | POA: Diagnosis not present

## 2019-11-20 DIAGNOSIS — M5030 Other cervical disc degeneration, unspecified cervical region: Secondary | ICD-10-CM | POA: Diagnosis not present

## 2019-11-20 DIAGNOSIS — G629 Polyneuropathy, unspecified: Secondary | ICD-10-CM | POA: Diagnosis not present

## 2019-11-20 DIAGNOSIS — E890 Postprocedural hypothyroidism: Secondary | ICD-10-CM | POA: Diagnosis not present

## 2019-11-20 DIAGNOSIS — I129 Hypertensive chronic kidney disease with stage 1 through stage 4 chronic kidney disease, or unspecified chronic kidney disease: Secondary | ICD-10-CM | POA: Diagnosis not present

## 2019-11-20 DIAGNOSIS — N1832 Chronic kidney disease, stage 3b: Secondary | ICD-10-CM | POA: Diagnosis not present

## 2019-11-20 DIAGNOSIS — N281 Cyst of kidney, acquired: Secondary | ICD-10-CM | POA: Diagnosis not present

## 2019-11-20 DIAGNOSIS — E782 Mixed hyperlipidemia: Secondary | ICD-10-CM | POA: Diagnosis not present

## 2019-11-20 DIAGNOSIS — R69 Illness, unspecified: Secondary | ICD-10-CM | POA: Diagnosis not present

## 2019-11-24 DIAGNOSIS — E890 Postprocedural hypothyroidism: Secondary | ICD-10-CM | POA: Diagnosis not present

## 2019-11-24 DIAGNOSIS — E782 Mixed hyperlipidemia: Secondary | ICD-10-CM | POA: Diagnosis not present

## 2019-11-24 DIAGNOSIS — R69 Illness, unspecified: Secondary | ICD-10-CM | POA: Diagnosis not present

## 2019-11-24 DIAGNOSIS — N281 Cyst of kidney, acquired: Secondary | ICD-10-CM | POA: Diagnosis not present

## 2019-11-24 DIAGNOSIS — M4722 Other spondylosis with radiculopathy, cervical region: Secondary | ICD-10-CM | POA: Diagnosis not present

## 2019-11-24 DIAGNOSIS — I129 Hypertensive chronic kidney disease with stage 1 through stage 4 chronic kidney disease, or unspecified chronic kidney disease: Secondary | ICD-10-CM | POA: Diagnosis not present

## 2019-11-24 DIAGNOSIS — M5030 Other cervical disc degeneration, unspecified cervical region: Secondary | ICD-10-CM | POA: Diagnosis not present

## 2019-11-24 DIAGNOSIS — N1832 Chronic kidney disease, stage 3b: Secondary | ICD-10-CM | POA: Diagnosis not present

## 2019-11-24 DIAGNOSIS — G629 Polyneuropathy, unspecified: Secondary | ICD-10-CM | POA: Diagnosis not present

## 2019-11-27 ENCOUNTER — Other Ambulatory Visit: Payer: Self-pay

## 2019-11-27 DIAGNOSIS — H2511 Age-related nuclear cataract, right eye: Secondary | ICD-10-CM | POA: Diagnosis not present

## 2019-11-27 DIAGNOSIS — E039 Hypothyroidism, unspecified: Secondary | ICD-10-CM

## 2019-11-27 DIAGNOSIS — I10 Essential (primary) hypertension: Secondary | ICD-10-CM

## 2019-11-27 MED ORDER — LEVOTHYROXINE SODIUM 88 MCG PO TABS: ORAL_TABLET | ORAL | 3 refills | Status: DC

## 2019-11-27 MED ORDER — IRBESARTAN 150 MG PO TABS: 150.0000 mg | ORAL_TABLET | Freq: Every day | ORAL | 3 refills | Status: DC

## 2019-11-28 DIAGNOSIS — I129 Hypertensive chronic kidney disease with stage 1 through stage 4 chronic kidney disease, or unspecified chronic kidney disease: Secondary | ICD-10-CM | POA: Diagnosis not present

## 2019-11-28 DIAGNOSIS — M5030 Other cervical disc degeneration, unspecified cervical region: Secondary | ICD-10-CM | POA: Diagnosis not present

## 2019-11-28 DIAGNOSIS — N1832 Chronic kidney disease, stage 3b: Secondary | ICD-10-CM | POA: Diagnosis not present

## 2019-11-28 DIAGNOSIS — R69 Illness, unspecified: Secondary | ICD-10-CM | POA: Diagnosis not present

## 2019-11-28 DIAGNOSIS — E890 Postprocedural hypothyroidism: Secondary | ICD-10-CM | POA: Diagnosis not present

## 2019-11-28 DIAGNOSIS — G629 Polyneuropathy, unspecified: Secondary | ICD-10-CM | POA: Diagnosis not present

## 2019-11-28 DIAGNOSIS — E782 Mixed hyperlipidemia: Secondary | ICD-10-CM | POA: Diagnosis not present

## 2019-11-28 DIAGNOSIS — M4722 Other spondylosis with radiculopathy, cervical region: Secondary | ICD-10-CM | POA: Diagnosis not present

## 2019-11-28 DIAGNOSIS — N281 Cyst of kidney, acquired: Secondary | ICD-10-CM | POA: Diagnosis not present

## 2019-11-29 DIAGNOSIS — R69 Illness, unspecified: Secondary | ICD-10-CM | POA: Diagnosis not present

## 2019-11-30 DIAGNOSIS — E890 Postprocedural hypothyroidism: Secondary | ICD-10-CM | POA: Diagnosis not present

## 2019-11-30 DIAGNOSIS — N1832 Chronic kidney disease, stage 3b: Secondary | ICD-10-CM | POA: Diagnosis not present

## 2019-11-30 DIAGNOSIS — I129 Hypertensive chronic kidney disease with stage 1 through stage 4 chronic kidney disease, or unspecified chronic kidney disease: Secondary | ICD-10-CM | POA: Diagnosis not present

## 2019-11-30 DIAGNOSIS — R69 Illness, unspecified: Secondary | ICD-10-CM | POA: Diagnosis not present

## 2019-11-30 DIAGNOSIS — N281 Cyst of kidney, acquired: Secondary | ICD-10-CM | POA: Diagnosis not present

## 2019-11-30 DIAGNOSIS — M4722 Other spondylosis with radiculopathy, cervical region: Secondary | ICD-10-CM | POA: Diagnosis not present

## 2019-11-30 DIAGNOSIS — E782 Mixed hyperlipidemia: Secondary | ICD-10-CM | POA: Diagnosis not present

## 2019-11-30 DIAGNOSIS — G629 Polyneuropathy, unspecified: Secondary | ICD-10-CM | POA: Diagnosis not present

## 2019-11-30 DIAGNOSIS — M5030 Other cervical disc degeneration, unspecified cervical region: Secondary | ICD-10-CM | POA: Diagnosis not present

## 2019-12-05 ENCOUNTER — Other Ambulatory Visit: Payer: Self-pay

## 2019-12-05 DIAGNOSIS — E039 Hypothyroidism, unspecified: Secondary | ICD-10-CM

## 2019-12-05 MED ORDER — LEVOTHYROXINE SODIUM 88 MCG PO TABS: ORAL_TABLET | ORAL | 1 refills | Status: DC

## 2019-12-05 NOTE — Telephone Encounter (Signed)
Please resend did not go through

## 2019-12-06 ENCOUNTER — Other Ambulatory Visit: Payer: Self-pay

## 2019-12-06 DIAGNOSIS — I10 Essential (primary) hypertension: Secondary | ICD-10-CM

## 2019-12-06 MED ORDER — IRBESARTAN 150 MG PO TABS: 150.0000 mg | ORAL_TABLET | Freq: Every day | ORAL | 1 refills | Status: DC

## 2019-12-06 NOTE — Telephone Encounter (Signed)
Please resend did not go through

## 2019-12-07 DIAGNOSIS — N1832 Chronic kidney disease, stage 3b: Secondary | ICD-10-CM | POA: Diagnosis not present

## 2019-12-07 DIAGNOSIS — R69 Illness, unspecified: Secondary | ICD-10-CM | POA: Diagnosis not present

## 2019-12-07 DIAGNOSIS — E890 Postprocedural hypothyroidism: Secondary | ICD-10-CM | POA: Diagnosis not present

## 2019-12-07 DIAGNOSIS — M5030 Other cervical disc degeneration, unspecified cervical region: Secondary | ICD-10-CM | POA: Diagnosis not present

## 2019-12-07 DIAGNOSIS — I129 Hypertensive chronic kidney disease with stage 1 through stage 4 chronic kidney disease, or unspecified chronic kidney disease: Secondary | ICD-10-CM | POA: Diagnosis not present

## 2019-12-07 DIAGNOSIS — M4722 Other spondylosis with radiculopathy, cervical region: Secondary | ICD-10-CM | POA: Diagnosis not present

## 2019-12-07 DIAGNOSIS — N281 Cyst of kidney, acquired: Secondary | ICD-10-CM | POA: Diagnosis not present

## 2019-12-07 DIAGNOSIS — E782 Mixed hyperlipidemia: Secondary | ICD-10-CM | POA: Diagnosis not present

## 2019-12-07 DIAGNOSIS — G629 Polyneuropathy, unspecified: Secondary | ICD-10-CM | POA: Diagnosis not present

## 2019-12-11 DIAGNOSIS — E782 Mixed hyperlipidemia: Secondary | ICD-10-CM | POA: Diagnosis not present

## 2019-12-11 DIAGNOSIS — M5030 Other cervical disc degeneration, unspecified cervical region: Secondary | ICD-10-CM | POA: Diagnosis not present

## 2019-12-11 DIAGNOSIS — E890 Postprocedural hypothyroidism: Secondary | ICD-10-CM | POA: Diagnosis not present

## 2019-12-11 DIAGNOSIS — I129 Hypertensive chronic kidney disease with stage 1 through stage 4 chronic kidney disease, or unspecified chronic kidney disease: Secondary | ICD-10-CM | POA: Diagnosis not present

## 2019-12-11 DIAGNOSIS — M4722 Other spondylosis with radiculopathy, cervical region: Secondary | ICD-10-CM | POA: Diagnosis not present

## 2019-12-11 DIAGNOSIS — N1832 Chronic kidney disease, stage 3b: Secondary | ICD-10-CM | POA: Diagnosis not present

## 2019-12-11 DIAGNOSIS — N281 Cyst of kidney, acquired: Secondary | ICD-10-CM | POA: Diagnosis not present

## 2019-12-11 DIAGNOSIS — R69 Illness, unspecified: Secondary | ICD-10-CM | POA: Diagnosis not present

## 2019-12-11 DIAGNOSIS — G629 Polyneuropathy, unspecified: Secondary | ICD-10-CM | POA: Diagnosis not present

## 2019-12-13 ENCOUNTER — Ambulatory Visit: Payer: Self-pay

## 2019-12-13 NOTE — Telephone Encounter (Signed)
Please let the patient know that [facility] [place]  voluntarily recalled lots of the Avapro (irbesartan) medication due to concerns for an impurity above the acceptable limit.  Pharmacies that have the irbesartan products that are being recalled are being contacted and I would recommend she contact her pharmacy to see if that is the case. If so, I can prescribe an alternative treatment.  Thanks, [facility]

## 2019-12-13 NOTE — Telephone Encounter (Signed)
Called patient. She has contacted pharmacy. They do not deal with [name]. She can continue taking her medication.

## 2019-12-13 NOTE — Telephone Encounter (Signed)
Pt. Saw on the news that Avapro has been recalled. She is concerned. Does she need to change medication? Please advise pt.  Answer Assessment - Initial Assessment Questions 1. NAME of MEDICATION: "What medicine are you calling about?"     Avapro 2. QUESTION: "What is your question?" (e.g., medication refill, side effect)     Has medication been recalled. 3. PRESCRIBING HCP: "Who prescribed it?" Reason: if prescribed by specialist, call should be referred to that group.     Dr. Roxan Hockey 4. SYMPTOMS: "Do you have any symptoms?"     No 5. SEVERITY: If symptoms are present, ask "Are they mild, moderate or severe?"     n/a 6. PREGNANCY:  "Is there any chance that you are pregnant?" "When was your last menstrual period?"     No  Protocols used: MEDICATION QUESTION CALL-A-AH

## 2019-12-14 ENCOUNTER — Ambulatory Visit: Payer: Medicare HMO | Admitting: Internal Medicine

## 2019-12-29 ENCOUNTER — Other Ambulatory Visit: Payer: Self-pay | Admitting: *Deleted

## 2019-12-29 DIAGNOSIS — D473 Essential (hemorrhagic) thrombocythemia: Secondary | ICD-10-CM

## 2020-01-01 ENCOUNTER — Other Ambulatory Visit: Payer: Self-pay

## 2020-01-01 ENCOUNTER — Inpatient Hospital Stay: Payer: Medicare HMO | Attending: Oncology

## 2020-01-01 DIAGNOSIS — D473 Essential (hemorrhagic) thrombocythemia: Secondary | ICD-10-CM | POA: Diagnosis not present

## 2020-01-01 LAB — CBC WITH DIFFERENTIAL/PLATELET
Abs Immature Granulocytes: 0.05 10*3/uL (ref 0.00–0.07)
Basophils Absolute: 0.1 10*3/uL (ref 0.0–0.1)
Basophils Relative: 1 %
Eosinophils Absolute: 0.2 10*3/uL (ref 0.0–0.5)
Eosinophils Relative: 2 %
HCT: 40.5 % (ref 36.0–46.0)
Hemoglobin: 13.8 g/dL (ref 12.0–15.0)
Immature Granulocytes: 1 %
Lymphocytes Relative: 17 %
Lymphs Abs: 1.5 10*3/uL (ref 0.7–4.0)
MCH: 31.5 pg (ref 26.0–34.0)
MCHC: 34.1 g/dL (ref 30.0–36.0)
MCV: 92.5 fL (ref 80.0–100.0)
Monocytes Absolute: 0.8 10*3/uL (ref 0.1–1.0)
Monocytes Relative: 9 %
Neutro Abs: 6.2 10*3/uL (ref 1.7–7.7)
Neutrophils Relative %: 70 %
Platelets: 489 10*3/uL — ABNORMAL HIGH (ref 150–400)
RBC: 4.38 MIL/uL (ref 3.87–5.11)
RDW: 14.4 % (ref 11.5–15.5)
WBC: 8.8 10*3/uL (ref 4.0–10.5)
nRBC: 0 % (ref 0.0–0.2)

## 2020-01-04 ENCOUNTER — Telehealth: Payer: Self-pay

## 2020-01-04 NOTE — Telephone Encounter (Signed)
Copied from [place] [id number]. Topic: General - Other >> Jan 04, 2020 11:59 AM Keene Breath wrote: Reason for CRM: Patient stated she is returning a call to the office.  She does not know why she was called.  Please call patient if needed at [phone]

## 2020-01-04 NOTE — Telephone Encounter (Signed)
Thank you very much 

## 2020-01-11 ENCOUNTER — Telehealth: Payer: Self-pay

## 2020-01-11 NOTE — Telephone Encounter (Signed)
Copied from [place] [id number]. Topic: General - Other >> Jan 11, 2020  9:54 AM Gina [name] wrote: Gina [name] would like [name] callback at [phone] is  Requesting that form that was faxed on nov 11th if the insured name could be put on the page of the cognitive screening form along with the page of the adl spatients name was missing.

## 2020-01-22 ENCOUNTER — Ambulatory Visit: Payer: Medicare HMO | Admitting: Obstetrics & Gynecology

## 2020-01-22 ENCOUNTER — Encounter: Payer: Self-pay | Admitting: Obstetrics & Gynecology

## 2020-01-22 ENCOUNTER — Other Ambulatory Visit: Payer: Self-pay

## 2020-01-22 VITALS — Wt 138.0 lb

## 2020-01-22 DIAGNOSIS — N814 Uterovaginal prolapse, unspecified: Secondary | ICD-10-CM

## 2020-01-22 NOTE — Telephone Encounter (Signed)
Gina [name] and asked that he fax back paperwork so that it can be corrected. LVM  for Gina [name] to call or fax back paperwork.

## 2020-01-22 NOTE — Telephone Encounter (Addendum)
Caller name: Shanon Brow  Relation to pt: Parameds  Call back number: [phone] and fax # [phone]   Reason for call:  Missing the following information from Cognitive Screening Form -   Section 1 Activity of daily living section please note how often the batting assist if its 1/2 the time or more and the insurer name and policy # [id number] has to reflect

## 2020-01-22 NOTE — Progress Notes (Signed)
HPI:      [name] is a 86 y.o. [phone] who presents today for her pessary follow up and examination related to her pelvic floor weakening.  Pt reports tolerating the pessary well with no vaginal bleeding and no vaginal discharge.  Symptoms of pelvic floor weakening have greatly improved. She is voiding and defecating without difficulty. She currently has a Ring #3 type pessary.  PMHx: She  has a past medical history of Breast mass in female, Cataract, Difficult intubation (03/03/2016), Diverticulitis, Diverticulosis (05/28/2015), Essential thrombocytosis ([place]) (07/23/2015), Genital prolapse, GERD (gastroesophageal reflux disease), Rosanna Randy syndrome (06/30/2016), Hyperlipemia, Hyperlipidemia, Hypothyroidism, Mild atherosclerosis of carotid artery (06/25/2015), Morton's neuroma, MRI contraindicated due to metal implant, Prolapse of uterus, Radiculopathy, Renal cyst, Rosacea, Shingles, and Thrombocytosis. Also,  has a past surgical history that includes Tonsillectomy; Appendectomy; Cesarean section; Joint replacement (Right); Thyroidectomy; Colon surgery; Cataract extraction w/PHACO (Left, 03/25/2015); Adenoidectomy; biopsy, right breast ; Total hip arthroplasty; Thyroid lobectomy; and Upper gi endoscopy (10/17/2008)., family history includes Dementia in her maternal grandfather; Stroke in her father and mother.,  reports that she has quit smoking. She has never used smokeless tobacco. She reports that she does not drink alcohol and does not use drugs.  She has a current medication list which includes the following prescription(s): acetaminophen, amlodipine, aspirin ec, atorvastatin, debrox, vitamin d3, famotidine, hydroxyurea, irbesartan, ketoconazole, levothyroxine, and systane. Also, is allergic to novocain [procaine].  Review of Systems  All other systems reviewed and are negative.   Objective: Wt 138 lb (62.6 kg)    BMI 23.69 kg/m  Physical Exam Constitutional:      General: She is not in acute  distress.    Appearance: She is well-developed.  Genitourinary:     Pelvic exam was performed with patient supine.     Vagina and uterus normal.     No vaginal erythema or bleeding.     No cervical motion tenderness, discharge, polyp or nabothian cyst.     Uterus is mobile.     Uterus is not enlarged.     No uterine mass detected.    Uterus is midaxial.     No right or left adnexal mass present.     Right adnexa not tender.     Left adnexa not tender.     Genitourinary Comments: Gr 2 uterine prolapse Gr 2 cystocele      HENT:     Head: Normocephalic and atraumatic.     Nose: Nose normal.  Abdominal:     General: There is no distension.     Palpations: Abdomen is soft.     Tenderness: There is no abdominal tenderness.  Musculoskeletal:        General: Normal range of motion.  Neurological:     Mental Status: She is alert and oriented to person, place, and time.     Cranial Nerves: No cranial nerve deficit.  Skin:    General: Skin is warm and dry.  Psychiatric:        Attention and Perception: Attention normal.        Mood and Affect: Mood and affect normal.        Speech: Speech normal.        Behavior: Behavior normal.        Thought Content: Thought content normal.        Judgment: Judgment normal.    Pessary Care Pessary removed and cleaned.  Vagina checked - without erosions - pessary replaced.  A/P:   ICD-10-CM  1. Uterine prolapse  N81.4   2. Cystocele with prolapse  N81.4   Pessary was cleaned and replaced today. Instructions given for care. Concerning symptoms to observe for are counseled to patient. Follow up scheduled for 4 months.  A total of 20 minutes were spent face-to-face with the patient as well as preparation, review, communication, and documentation during this encounter.   Barnett Applebaum, MD, [facility], [facility] 01/22/2020  11:09 AM

## 2020-01-22 NOTE — Patient Instructions (Signed)
Thank you for choosing [facility]. As part of our ongoing efforts to improve patient experience, we would appreciate your feedback. Please fill out the short survey that you will receive by mail or MyChart. Your opinion is important to us! -Dr Carol Theys  

## 2020-01-23 ENCOUNTER — Other Ambulatory Visit: Payer: Self-pay | Admitting: Obstetrics & Gynecology

## 2020-01-23 DIAGNOSIS — Z1239 Encounter for other screening for malignant neoplasm of breast: Secondary | ICD-10-CM

## 2020-01-26 NOTE — Telephone Encounter (Signed)
Finally got paperwork back from [place]. Paperwork has been corrected and faxed back.

## 2020-02-08 ENCOUNTER — Telehealth: Payer: Self-pay | Admitting: Internal Medicine

## 2020-02-08 NOTE — Telephone Encounter (Signed)
Copied from [place] [id number]. Topic: Quick Sport and exercise psychologist Patient (Clinic Use ONLY) >> Feb 08, 2020 11:31 AM Lennox Solders wrote: Reason for CRM: Pt is calling crystal back to let her know the date she enter [facility] was 09-26-2019 that needs to be on the paperwork for her insurance

## 2020-02-09 NOTE — Telephone Encounter (Signed)
Yes I did get patient message. Forms have been completed and faxed again. I tried to call patient. No answer.

## 2020-02-09 NOTE — Telephone Encounter (Signed)
Patient is calling to make sure Crystal got the message she left a few days ago.  Please call patient to confirm.

## 2020-02-19 ENCOUNTER — Other Ambulatory Visit: Payer: Self-pay | Admitting: Obstetrics & Gynecology

## 2020-02-19 DIAGNOSIS — Z9889 Other specified postprocedural states: Secondary | ICD-10-CM

## 2020-03-19 DIAGNOSIS — F418 Other specified anxiety disorders: Secondary | ICD-10-CM | POA: Insufficient documentation

## 2020-03-19 DIAGNOSIS — E782 Mixed hyperlipidemia: Secondary | ICD-10-CM | POA: Insufficient documentation

## 2020-03-19 NOTE — Progress Notes (Signed)
Name: Gina [name]   MRN: [id number]    DOB: January 11, 1927   Date:03/20/2020       Progress Note  Chief Complaint  Patient presents with  . Hypertension  . Hypothyroidism  . Hyperlipidemia     Subjective:   Gina [name] is a 87 y.o. female, presents to clinic for routine f/up  Hx of HTN - on norvasc 5 mg and irbesartan She has CKD - sees nephrology: Last OV 10/2019 reviewed notes, plan and results today: Impression/Recommendations   Patient is a 87 y.o. 1-White female of [place] descent with history of long-standing hypertension, hyperlipidemia, atherosclerosis and history of partial thyroidectomy  1. Stage 3b chronic kidney disease ([place])  2. Hyperkalemia  3. Cyst of kidney  4. Hyponatremia   1. Stage 3b chronic kidney disease ([place]) Underlying CKD likely secondary to hypertension and atherosclerosis Continued on aspirin, atorvastatin and irbesartan for cardiovascular risk reduction Most recent labs from June 2 show creatinine of 1.10, GFR 44 Previously in March 16, 2019, Creatinine was 1.23, GFR 39 Overall progression appears to be slow  2. Hyperkalemia Continue low potassium diet Continue to monitor Last potassium of 5.2 in June 2021  3. Renal cyst Large right renal cyst was seen on CT in 2016 Appears to be benign  4. Hyponatremia Last sodium 133 in June 2021 Continue to monitor  No follow-ups on file.  Gina Iba, MD    Hyperlipidemia: Currently treated with lipitor 20, pt reports good med compliance - she tolerates this well w/o SE or concerns Last Lipids: Lab Results  Component Value Date   CHOL 152 03/16/2019   HDL 52 03/16/2019   LDLCALC 76 03/16/2019   TRIG 141 03/16/2019   CHOLHDL 2.9 03/16/2019   - Denies: Chest pain, shortness of breath, myalgias, claudication  Hypothyroidism: Current Medication Regimen: 88 mcg synthroid Takes medicine daily in the am Current Symptoms: denies fatigue, weight changes, heat/cold intolerance, bowel/skin  changes or CVS symptoms Most recent results are below; we will be repeating labs today. Lab Results  Component Value Date   TSH 1.89 03/16/2019   GERD:  On pepcid BID prn - sx well controlled  Moved into [facility] assisted living - PCP a few months ago did orders and documentation for aid - she states today her hand numbness is worse and she now needs help with dressing and putting on her compression socks - currently orders are only for assistance with bathing  Forms from Gina [name] brought in with pt  Hx of anxiety and depression Depression screen [facility] 2/9 03/20/2020 11/15/2019 09/13/2019  Decreased Interest 0 0 0  Down, Depressed, Hopeless 1 0 1  PHQ - 2 Score 1 0 1  Altered sleeping - - 1  Tired, decreased energy - - 1  Change in appetite - - 0  Feeling bad or failure about yourself  - - 0  Trouble concentrating - - 1  Moving slowly or fidgety/restless - - 0  Suicidal thoughts - - 0  PHQ-9 Score - - 4  Difficult doing work/chores - - Somewhat difficult  Some recent data might be hidden      Current Outpatient Medications:  .  Acetaminophen 500 MG coapsule, Take by mouth every 4 (four) hours as needed for fever., Disp: , Rfl:  .  aspirin EC 81 MG tablet, Take 81 mg by mouth daily. , Disp: , Rfl:  .  Cholecalciferol (VITAMIN D3) 1000 units CAPS, Take by mouth daily. , Disp: , Rfl:  .  hydroxyurea (HYDREA) 500 MG capsule, Take 500 mg (1 capsule) on Monday, Wednesday, and Friday., Disp: 30 capsule, Rfl: 5 .  ketoconazole (NIZORAL) 2 % shampoo, Apply 1 application topically 2 (two) times a week. , Disp: , Rfl:  .  Polyethyl Glycol-Propyl Glycol (SYSTANE) 0.4-0.3 % SOLN, Apply to eye., Disp: , Rfl:  .  amLODipine (NORVASC) 5 MG tablet, Take 1 tablet (5 mg total) by mouth daily., Disp: 90 tablet, Rfl: 3 .  atorvastatin (LIPITOR) 20 MG tablet, Take 1 tablet (20 mg total) by mouth at bedtime., Disp: 90 tablet, Rfl: 2 .  carbamide peroxide (DEBROX) 6.5 % OTIC solution, Place 5 drops into  both ears 2 (two) times daily. Apply as needed to help with preventing wax buildup (Patient not taking: Reported on 03/20/2020), Disp: 15 mL, Rfl: 3 .  famotidine (PEPCID) 20 MG tablet, Take one tab once to twice daily as needed, Disp: 60 tablet, Rfl: 3 .  irbesartan (AVAPRO) 150 MG tablet, Take 1 tablet (150 mg total) by mouth daily., Disp: 90 tablet, Rfl: 1 .  levothyroxine (SYNTHROID) 88 MCG tablet, TAKE 1 TABLET BY MOUTH ONCE DAILY BEFORE BREAKFAST, Disp: 90 tablet, Rfl: 1  Patient Active Problem List   Diagnosis Date Noted  . Mixed hyperlipidemia 03/19/2020  . Anxiety with depression 03/19/2020  . Degenerative disc disease, cervical 05/10/2019  . De Quervain's tenosynovitis, left 05/10/2019  . Bilateral hand numbness 05/10/2019  . DNR (do not resuscitate) 04/22/2018  . Venous stasis 04/21/2018  . Cystocele with prolapse 04/01/2017  . Hemorrhoids 07/29/2016  . Gilbert syndrome 06/30/2016  . Essential thrombocytosis ([place]) 07/23/2015  . Mild atherosclerosis of carotid artery 06/25/2015  . Dyslipidemia 06/03/2015  . Diverticulosis 05/28/2015  . GERD without esophagitis 02/06/2015  . Neuropathy 01/31/2015  . Cervical radiculopathy 01/31/2015  . Stage 3b chronic kidney disease ([place]) 10/06/2014  . Renal cyst 10/06/2014  . Uterine prolapse 10/06/2014  . Cervical radiculopathy due to degenerative joint disease of spine 10/06/2014  . History of right hip replacement 10/06/2014  . Hypothyroidism 10/05/2014  . Essential hypertension 09/13/2014    Past Surgical History:  Procedure Laterality Date  . ADENOIDECTOMY    . APPENDECTOMY    . biopsy, right breast     . CATARACT EXTRACTION W/PHACO Left 03/25/2015   Procedure: CATARACT EXTRACTION PHACO AND INTRAOCULAR LENS PLACEMENT (IOC);  Surgeon: Estill Cotta, MD;  Location: [facility] ORS;  Service: Ophthalmology;  Laterality: Left;  [place] 02:09 AP% 27.3 CDE 59.97 fluid pack lot [id number]  . CESAREAN SECTION    . COLON SURGERY    . JOINT  REPLACEMENT Right    total hip  . THYROID LOBECTOMY    . THYROIDECTOMY     partial  . TONSILLECTOMY    . TOTAL HIP ARTHROPLASTY    . UPPER GI ENDOSCOPY  10/17/2008    Family History  Problem Relation Age of Onset  . Stroke Mother   . Stroke Father   . Dementia Maternal Grandfather     Social History   Tobacco Use  . Smoking status: Former Research scientist (life sciences)  . Smokeless tobacco: Never Used  Vaping Use  . Vaping Use: Never used  Substance Use Topics  . Alcohol use: No    Alcohol/week: 0.0 standard drinks  . Drug use: No     Allergies  Allergen Reactions  . Novocain [Procaine] Palpitations    Health Maintenance  Topic Date Due  . TETANUS/TDAP  Never done  . COVID-19 Vaccine (3 - Pfizer risk 4-dose series)  05/28/2019  . DEXA SCAN  09/12/2020 (Originally 06/22/1993)  . INFLUENZA VACCINE  Completed  . PNA vac Low Risk Adult  Completed  . MAMMOGRAM  Discontinued    Chart Review Today: I personally reviewed active problem list, medication list, allergies, family history, social history, health maintenance, notes from last encounter, lab results, imaging with the patient/caregiver today.   Review of Systems  Constitutional: Negative.   HENT: Negative.   Eyes: Negative.   Respiratory: Negative.   Cardiovascular: Negative.   Gastrointestinal: Negative.   Endocrine: Negative.   Genitourinary: Negative.   Musculoskeletal: Negative.   Skin: Negative.   Allergic/Immunologic: Negative.   Neurological: Negative.   Hematological: Negative.   Psychiatric/Behavioral: Negative.   All other systems reviewed and are negative.    Objective:   Vitals:   03/20/20 1109  BP: 128/76  Pulse: 90  Resp: 14  Temp: 97.7 F (36.5 C)  TempSrc: Oral  SpO2: 97%  Weight: 147 lb 8 oz (66.9 kg)  Height: 5\' 4"  (1.626 m)    Body mass index is 25.32 kg/m.  Physical Exam Vitals and nursing note reviewed.  Constitutional:      General: She is not in acute distress.    Appearance:  Normal appearance. She is well-developed, well-groomed and normal weight. She is not ill-appearing, toxic-appearing or diaphoretic.     Interventions: Face mask in place.     Comments: Well appearing elderly female  HENT:     Head: Normocephalic and atraumatic.     Right Ear: Tympanic membrane, ear canal and external ear normal. Decreased hearing noted. No tenderness. No middle ear effusion. There is no impacted cerumen. No mastoid tenderness.     Left Ear: Tympanic membrane, ear canal and external ear normal. Decreased hearing noted. No tenderness.  No middle ear effusion. There is no impacted cerumen. No mastoid tenderness.     Ears:     Comments: HOH Eyes:     General: Lids are normal. No scleral icterus.       Right eye: No discharge.        Left eye: No discharge.     Conjunctiva/sclera: Conjunctivae normal.  Neck:     Trachea: Phonation normal. No tracheal deviation.  Cardiovascular:     Rate and Rhythm: Normal rate and regular rhythm.     Pulses: Normal pulses.          Radial pulses are 2+ on the right side and 2+ on the left side.     Heart sounds: Normal heart sounds. No murmur heard. No friction rub. No gallop.   Pulmonary:     Effort: Pulmonary effort is normal. No respiratory distress.     Breath sounds: Normal breath sounds. No stridor. No wheezing, rhonchi or rales.  Chest:     Chest wall: No tenderness.  Abdominal:     General: Bowel sounds are normal. There is no distension.     Palpations: Abdomen is soft.  Musculoskeletal:     Right lower leg: No edema.     Left lower leg: No edema.  Skin:    General: Skin is warm and dry.     Coloration: Skin is not jaundiced or pale.     Findings: No rash.  Neurological:     Mental Status: She is alert.     Cranial Nerves: No dysarthria or facial asymmetry.     Sensory: Sensory deficit present.     Motor: No abnormal muscle tone.     Gait:  Gait abnormal.     Comments: 5/5 symmetrical grip strength, b/l UE sensation to  light touch abnormal  Psychiatric:        Mood and Affect: Mood normal.        Speech: Speech normal.        Behavior: Behavior normal. Behavior is cooperative.         Assessment & Plan:   Problem List Items Addressed This Visit      Cardiovascular and Mediastinum   Essential hypertension - Primary (Chronic)    Stable, well controlled, BP at goal, good med compliance, no SE or concerns Continue meds and healthy diet/lifestyle      Relevant Medications   amLODipine (NORVASC) 5 MG tablet   atorvastatin (LIPITOR) 20 MG tablet   irbesartan (AVAPRO) 150 MG tablet   Other Relevant Orders   COMPLETE METABOLIC PANEL WITH GFR   Mild atherosclerosis of carotid artery (Chronic)    On statin, monitoring      Relevant Medications   amLODipine (NORVASC) 5 MG tablet   atorvastatin (LIPITOR) 20 MG tablet   irbesartan (AVAPRO) 150 MG tablet     Digestive   GERD without esophagitis    Sx currently well controlled on prn pepcid - denies abd pain, change in bowels/appetite, dysphagia, melena/hematochezia      Relevant Medications   famotidine (PEPCID) 20 MG tablet     Endocrine   Hypothyroidism    Stable meds/dosing, she complains of belly fat, denies weight changes, fatigue, swelling, mood changes Due for recheck of labs - discussed adjusting medication dose if needed due to abnormal labs      Relevant Medications   levothyroxine (SYNTHROID) 88 MCG tablet   Other Relevant Orders   TSH     Genitourinary   Stage 3b chronic kidney disease ([place])    Reviewed last nephrology OV and labs Managed by nephrology- Dr. Candiss Norse       Relevant Medications   amLODipine (NORVASC) 5 MG tablet   irbesartan (AVAPRO) 150 MG tablet   Other Relevant Orders   COMPLETE METABOLIC PANEL WITH GFR     Hematopoietic and Hemostatic   Essential thrombocytosis ([facility]) (Chronic)    Stable - per Dr. Grayland Ormond, on hydroxyurea       Relevant Orders   CBC with Differential/Platelet     Other    Bilateral hand numbness    She endorses worsening sx and more difficulty with ADLs/dressing herself      Mixed hyperlipidemia    Compliant with meds, no SE, no myalgias, fatigue or jaundice Due for lipids and monitoring CMP Continue healthy diet/lifestyle efforts as able       Relevant Medications   amLODipine (NORVASC) 5 MG tablet   atorvastatin (LIPITOR) 20 MG tablet   irbesartan (AVAPRO) 150 MG tablet   Other Relevant Orders   COMPLETE METABOLIC PANEL WITH GFR   Lipid panel   Anxiety with depression    Pt phq2 done - score of 1, she endorsed anxiety about her health and insurance coverage, overall mood good      Paresthesia of upper and lower extremities of both sides    Pt endorses worsening sx - checking labs - CBC TSH      Requires assistance with activities of daily living (ADL)    Worsening UE paresthesias and difficulty with dressing - asked for it to be noted on paperwork today - see scanned in completed forms from visit PCP recently left - he did FL2 and  [place] orders - unsure if her endorsed sx and increased need will require more paperwork - will watch for correspondence with [facility]       Other Visit Diagnoses    Encounter for medication monitoring       Relevant Orders   CBC with Differential/Platelet   COMPLETE METABOLIC PANEL WITH GFR   Lipid panel   TSH       Return in about 6 months (around 09/17/2020) for with MD for routine f/up and to transfer care/est new PCP .   Delsa Grana, PA-C 03/20/20 11:32 AM

## 2020-03-20 ENCOUNTER — Ambulatory Visit (INDEPENDENT_AMBULATORY_CARE_PROVIDER_SITE_OTHER): Payer: Medicare HMO | Admitting: Family Medicine

## 2020-03-20 ENCOUNTER — Encounter: Payer: Self-pay | Admitting: Family Medicine

## 2020-03-20 ENCOUNTER — Ambulatory Visit: Payer: Medicare HMO | Admitting: Internal Medicine

## 2020-03-20 ENCOUNTER — Other Ambulatory Visit: Payer: Self-pay

## 2020-03-20 VITALS — BP 128/76 | HR 90 | Temp 97.7°F | Resp 14 | Ht 64.0 in | Wt 147.5 lb

## 2020-03-20 DIAGNOSIS — D473 Essential (hemorrhagic) thrombocythemia: Secondary | ICD-10-CM

## 2020-03-20 DIAGNOSIS — E890 Postprocedural hypothyroidism: Secondary | ICD-10-CM

## 2020-03-20 DIAGNOSIS — N1832 Chronic kidney disease, stage 3b: Secondary | ICD-10-CM | POA: Diagnosis not present

## 2020-03-20 DIAGNOSIS — E782 Mixed hyperlipidemia: Secondary | ICD-10-CM

## 2020-03-20 DIAGNOSIS — F418 Other specified anxiety disorders: Secondary | ICD-10-CM

## 2020-03-20 DIAGNOSIS — I6523 Occlusion and stenosis of bilateral carotid arteries: Secondary | ICD-10-CM

## 2020-03-20 DIAGNOSIS — Z5181 Encounter for therapeutic drug level monitoring: Secondary | ICD-10-CM

## 2020-03-20 DIAGNOSIS — I10 Essential (primary) hypertension: Secondary | ICD-10-CM | POA: Diagnosis not present

## 2020-03-20 DIAGNOSIS — Z741 Need for assistance with personal care: Secondary | ICD-10-CM

## 2020-03-20 DIAGNOSIS — R200 Anesthesia of skin: Secondary | ICD-10-CM

## 2020-03-20 DIAGNOSIS — K219 Gastro-esophageal reflux disease without esophagitis: Secondary | ICD-10-CM | POA: Diagnosis not present

## 2020-03-20 DIAGNOSIS — R69 Illness, unspecified: Secondary | ICD-10-CM | POA: Diagnosis not present

## 2020-03-20 DIAGNOSIS — R202 Paresthesia of skin: Secondary | ICD-10-CM | POA: Insufficient documentation

## 2020-03-20 MED ORDER — ATORVASTATIN CALCIUM 20 MG PO TABS: 20.0000 mg | ORAL_TABLET | Freq: Every day | ORAL | 2 refills | Status: DC

## 2020-03-20 MED ORDER — IRBESARTAN 150 MG PO TABS: 150.0000 mg | ORAL_TABLET | Freq: Every day | ORAL | 1 refills | Status: DC

## 2020-03-20 MED ORDER — LEVOTHYROXINE SODIUM 88 MCG PO TABS: ORAL_TABLET | ORAL | 1 refills | Status: DC

## 2020-03-20 MED ORDER — AMLODIPINE BESYLATE 5 MG PO TABS: 5.0000 mg | ORAL_TABLET | Freq: Every day | ORAL | 3 refills | Status: DC

## 2020-03-20 MED ORDER — FAMOTIDINE 20 MG PO TABS: ORAL_TABLET | ORAL | 3 refills | Status: DC

## 2020-03-20 NOTE — Assessment & Plan Note (Signed)
Worsening UE paresthesias and difficulty with dressing - asked for it to be noted on paperwork today - see scanned in completed forms from visit PCP recently left - he did FL2 and HH orders - unsure if her endorsed sx and increased need will require more paperwork - will watch for correspondence with [facility]

## 2020-03-20 NOTE — Assessment & Plan Note (Signed)
On statin, monitoring 

## 2020-03-20 NOTE — Assessment & Plan Note (Signed)
Pt endorses worsening sx - checking labs - CBC TSH

## 2020-03-20 NOTE — Assessment & Plan Note (Signed)
Stable, well controlled, BP at goal, good med compliance, no SE or concerns Continue meds and healthy diet/lifestyle

## 2020-03-20 NOTE — Assessment & Plan Note (Signed)
Compliant with meds, no SE, no myalgias, fatigue or jaundice Due for lipids and monitoring CMP Continue healthy diet/lifestyle efforts as able

## 2020-03-20 NOTE — Assessment & Plan Note (Signed)
Reviewed last nephrology OV and labs Managed by nephrology- Dr. Candiss Norse

## 2020-03-20 NOTE — Assessment & Plan Note (Signed)
Stable meds/dosing, she complains of belly fat, denies weight changes, fatigue, swelling, mood changes Due for recheck of labs - discussed adjusting medication dose if needed due to abnormal labs

## 2020-03-20 NOTE — Assessment & Plan Note (Signed)
Sx currently well controlled on prn pepcid - denies abd pain, change in bowels/appetite, dysphagia, melena/hematochezia

## 2020-03-20 NOTE — Assessment & Plan Note (Signed)
Stable - per Dr. Grayland Ormond, on hydroxyurea

## 2020-03-20 NOTE — Assessment & Plan Note (Signed)
Pt phq2 done - score of 1, she endorsed anxiety about her health and insurance coverage, overall mood good

## 2020-03-20 NOTE — Assessment & Plan Note (Signed)
She endorses worsening sx and more difficulty with ADLs/dressing herself

## 2020-03-21 LAB — COMPLETE METABOLIC PANEL WITH GFR
AG Ratio: 1.8 (calc) (ref 1.0–2.5)
ALT: 12 U/L (ref 6–29)
AST: 18 U/L (ref 10–35)
Albumin: 4.6 g/dL (ref 3.6–5.1)
Alkaline phosphatase (APISO): 80 U/L (ref 37–153)
BUN/Creatinine Ratio: 25 (calc) — ABNORMAL HIGH (ref 6–22)
BUN: 29 mg/dL — ABNORMAL HIGH (ref 7–25)
CO2: 24 mmol/L (ref 20–32)
Calcium: 10 mg/dL (ref 8.6–10.4)
Chloride: 104 mmol/L (ref 98–110)
Creat: 1.16 mg/dL — ABNORMAL HIGH (ref 0.60–0.88)
GFR, Est African American: 48 mL/min/{1.73_m2} — ABNORMAL LOW (ref >=60)
GFR, Est Non African American: 41 mL/min/{1.73_m2} — ABNORMAL LOW (ref >=60)
Globulin: 2.6 g/dL (calc) (ref 1.9–3.7)
Glucose, Bld: 98 mg/dL (ref 65–139)
Potassium: 4.9 mmol/L (ref 3.5–5.3)
Sodium: 138 mmol/L (ref 135–146)
Total Bilirubin: 1.3 mg/dL — ABNORMAL HIGH (ref 0.2–1.2)
Total Protein: 7.2 g/dL (ref 6.1–8.1)

## 2020-03-21 LAB — CBC WITH DIFFERENTIAL/PLATELET
Absolute Monocytes: 648 cells/uL (ref 200–950)
Basophils Absolute: 32 cells/uL (ref 0–200)
Basophils Relative: 0.4 %
Eosinophils Absolute: 113 cells/uL (ref 15–500)
Eosinophils Relative: 1.4 %
HCT: 41.6 % (ref 35.0–45.0)
Hemoglobin: 14.5 g/dL (ref 11.7–15.5)
Lymphs Abs: 1377 cells/uL (ref 850–3900)
MCH: 31.7 pg (ref 27.0–33.0)
MCHC: 34.9 g/dL (ref 32.0–36.0)
MCV: 91 fL (ref 80.0–100.0)
MPV: 9.9 fL (ref 7.5–12.5)
Monocytes Relative: 8 %
Neutro Abs: 5929 cells/uL (ref 1500–7800)
Neutrophils Relative %: 73.2 %
Platelets: 448 10*3/uL — ABNORMAL HIGH (ref 140–400)
RBC: 4.57 10*6/uL (ref 3.80–5.10)
RDW: 13.8 % (ref 11.0–15.0)
Total Lymphocyte: 17 %
WBC: 8.1 10*3/uL (ref 3.8–10.8)

## 2020-03-21 LAB — LIPID PANEL
Cholesterol: 166 mg/dL (ref <200)
HDL: 55 mg/dL (ref >=50)
LDL Cholesterol (Calc): 88 mg/dL (calc)
Non-HDL Cholesterol (Calc): 111 mg/dL (calc) (ref <130)
Total CHOL/HDL Ratio: 3 (calc) (ref <5.0)
Triglycerides: 134 mg/dL (ref <150)

## 2020-03-21 LAB — TSH: TSH: 2.31 mIU/L (ref 0.40–4.50)

## 2020-03-22 ENCOUNTER — Telehealth: Payer: Self-pay | Admitting: Internal Medicine

## 2020-03-22 NOTE — Telephone Encounter (Signed)
Copied from [place] [id number]. Topic: Quick Communication - Lab Results (Clinic Use ONLY) >> Mar 22, 2020  1:51 PM Hollie Salk, [place] wrote: [facility] may release lab results to patient. >> Mar 22, 2020  1:52 PM Hollie Salk, [place] wrote: [facility] may release labs to patient Patient would like a call back with Lab result

## 2020-03-22 NOTE — Telephone Encounter (Signed)
Left message on voicemail to return call for results.  

## 2020-03-25 NOTE — Telephone Encounter (Signed)
Pt. Given lab results, verbalizes understanding. 

## 2020-04-02 ENCOUNTER — Encounter: Payer: Medicare HMO | Admitting: Dermatology

## 2020-04-02 ENCOUNTER — Telehealth: Payer: Self-pay | Admitting: Oncology

## 2020-04-02 NOTE — Telephone Encounter (Signed)
Pt left vm to reschedule appt for 2/11 due to testing positive for COVID on 1/31.  Routing to scheduler for follow up.

## 2020-04-05 ENCOUNTER — Inpatient Hospital Stay: Payer: Medicare HMO | Admitting: Oncology

## 2020-04-05 ENCOUNTER — Inpatient Hospital Stay: Payer: Medicare HMO

## 2020-04-15 ENCOUNTER — Other Ambulatory Visit: Payer: Self-pay

## 2020-04-15 ENCOUNTER — Inpatient Hospital Stay: Payer: Medicare HMO | Attending: Oncology | Admitting: Oncology

## 2020-04-15 ENCOUNTER — Inpatient Hospital Stay: Payer: Medicare HMO

## 2020-04-15 VITALS — BP 133/58 | HR 77 | Temp 97.2°F | Resp 18 | Wt 150.0 lb

## 2020-04-15 DIAGNOSIS — L659 Nonscarring hair loss, unspecified: Secondary | ICD-10-CM | POA: Diagnosis not present

## 2020-04-15 DIAGNOSIS — D473 Essential (hemorrhagic) thrombocythemia: Secondary | ICD-10-CM | POA: Insufficient documentation

## 2020-04-15 DIAGNOSIS — Z8616 Personal history of COVID-19: Secondary | ICD-10-CM | POA: Insufficient documentation

## 2020-04-15 DIAGNOSIS — Z7982 Long term (current) use of aspirin: Secondary | ICD-10-CM | POA: Insufficient documentation

## 2020-04-15 DIAGNOSIS — Z87891 Personal history of nicotine dependence: Secondary | ICD-10-CM | POA: Insufficient documentation

## 2020-04-15 DIAGNOSIS — N289 Disorder of kidney and ureter, unspecified: Secondary | ICD-10-CM | POA: Diagnosis not present

## 2020-04-15 LAB — COMPREHENSIVE METABOLIC PANEL
ALT: 15 U/L (ref 0–44)
AST: 20 U/L (ref 15–41)
Albumin: 4.1 g/dL (ref 3.5–5.0)
Alkaline Phosphatase: 68 U/L (ref 38–126)
Anion gap: 9 (ref 5–15)
BUN: 31 mg/dL — ABNORMAL HIGH (ref 8–23)
CO2: 25 mmol/L (ref 22–32)
Calcium: 9.1 mg/dL (ref 8.9–10.3)
Chloride: 104 mmol/L (ref 98–111)
Creatinine, Ser: 1.13 mg/dL — ABNORMAL HIGH (ref 0.44–1.00)
GFR, Estimated: 46 mL/min — ABNORMAL LOW (ref >60)
Glucose, Bld: 130 mg/dL — ABNORMAL HIGH (ref 70–99)
Potassium: 4.3 mmol/L (ref 3.5–5.1)
Sodium: 138 mmol/L (ref 135–145)
Total Bilirubin: 1.2 mg/dL (ref 0.3–1.2)
Total Protein: 7.2 g/dL (ref 6.5–8.1)

## 2020-04-15 LAB — CBC WITH DIFFERENTIAL/PLATELET
Abs Immature Granulocytes: 0.06 10*3/uL (ref 0.00–0.07)
Basophils Absolute: 0.1 10*3/uL (ref 0.0–0.1)
Basophils Relative: 1 %
Eosinophils Absolute: 0.1 10*3/uL (ref 0.0–0.5)
Eosinophils Relative: 2 %
HCT: 40.4 % (ref 36.0–46.0)
Hemoglobin: 13.4 g/dL (ref 12.0–15.0)
Immature Granulocytes: 1 %
Lymphocytes Relative: 17 %
Lymphs Abs: 1.4 10*3/uL (ref 0.7–4.0)
MCH: 31.6 pg (ref 26.0–34.0)
MCHC: 33.2 g/dL (ref 30.0–36.0)
MCV: 95.3 fL (ref 80.0–100.0)
Monocytes Absolute: 0.7 10*3/uL (ref 0.1–1.0)
Monocytes Relative: 8 %
Neutro Abs: 6.2 10*3/uL (ref 1.7–7.7)
Neutrophils Relative %: 71 %
Platelets: 390 10*3/uL (ref 150–400)
RBC: 4.24 MIL/uL (ref 3.87–5.11)
RDW: 14.2 % (ref 11.5–15.5)
WBC: 8.6 10*3/uL (ref 4.0–10.5)
nRBC: 0 % (ref 0.0–0.2)

## 2020-04-15 NOTE — Progress Notes (Signed)
[place]  [phone] [phone]  ID: Gina [name] OB: [birth year]  MR#: [id number]  VZD#:[id number]  Patient Care Team: Towanda Malkin, MD (Inactive) as PCP - General (Internal Medicine) Murlean Iba, MD (Internal Medicine) Gae Dry, MD as Referring Physician (Obstetrics and Gynecology) Estill Cotta, MD (Ophthalmology) Anabel Bene, MD as Referring Physician (Neurology) Beverly Gust, MD (Otolaryngology) Brendolyn Patty, MD (Dermatology) Lloyd Huger, MD as Consulting Physician (Oncology)  CHIEF COMPLAINT: Essential thrombocytosis  INTERVAL HISTORY: Patient returns to clinic today for repeat laboratory work and routine 30-month evaluation.  She was last seen in clinic on 09/29/2019.   Hydrea dose was changed from 3 days a week to 4 days a week d/t persistently elevated platelet counts.  She is followed closely by her PCP Verline Lema, PA.  Chronic conditions appear to be under control.  Patient tested positive for COVID on 03/25/2020.  She has since recovered and is doing well.  She continues to live at [facility] assisted [facility] and enjoys her living conditions.  She is tolerating increased Hydrea. Endorses increase hair loss and changes in her nails. She does not complain of weakness or fatigue today.  She has no neurologic complaints.  She denies any recent fevers or illnesses.  She is wondering if this is related to her increase in hydrea. She has a good appetite and denies weight loss.  She denies any chest pain, shortness of breath, cough, or hemoptysis.  She denies any nausea, vomiting, constipation, or diarrhea.  She has no urinary complaints.  Patient offers no specific complaints today.  REVIEW OF SYSTEMS:   Review of Systems  Constitutional: Negative.  Negative for fever, malaise/fatigue and weight loss.  HENT:       Hair loss, nail changes  Respiratory: Negative.  Negative for cough, hemoptysis and  shortness of breath.   Cardiovascular: Negative.  Negative for chest pain and leg swelling.  Gastrointestinal: Negative.  Negative for abdominal pain.  Genitourinary: Negative.  Negative for dysuria.  Musculoskeletal: Negative.  Negative for back pain.  Skin: Positive for itching. Negative for rash.  Neurological: Negative.  Negative for focal weakness, weakness and headaches.  Psychiatric/Behavioral: Negative.  The patient is not nervous/anxious.     As per HPI. Otherwise, a complete review of systems is negative.  PAST MEDICAL HISTORY: Past Medical History:  Diagnosis Date  . Breast mass in female    right breast  . Cataract   . Difficult intubation 03/03/2016   September 06, 2013; left nasal fiberoptic intubation #7 ETT; see letter from Dr. Loanne Drilling, Dept of Anesthesiology, [facility]  . Diverticulitis   . Diverticulosis 05/28/2015  . Essential thrombocytosis ([place]) 07/23/2015  . Genital prolapse   . GERD (gastroesophageal reflux disease)   . 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 Mike Gip  . Hemorrhoids 07/29/2016  . Hyperlipemia   . Hyperlipidemia    history of   . Hypothyroidism   . Mild atherosclerosis of carotid artery 06/25/2015  . Morton's neuroma   . MRI contraindicated due to metal implant   . Prolapse of uterus   . Radiculopathy   . Renal cyst   . Rosacea   . Shingles   . Thrombocytosis     PAST SURGICAL HISTORY: Past Surgical History:  Procedure Laterality Date  . ADENOIDECTOMY    . APPENDECTOMY    . biopsy, right breast     . CATARACT EXTRACTION W/PHACO Left 03/25/2015   Procedure: CATARACT EXTRACTION PHACO AND INTRAOCULAR  LENS PLACEMENT (IOC);  Surgeon: Estill Cotta, MD;  Location: [facility] ORS;  Service: Ophthalmology;  Laterality: Left;  [place] 02:09 AP% 27.3 CDE 59.97 fluid pack lot #[id number]  . CESAREAN SECTION    . COLON SURGERY    . JOINT REPLACEMENT Right    total hip  . THYROID LOBECTOMY    . THYROIDECTOMY     partial  . TONSILLECTOMY    . TOTAL  HIP ARTHROPLASTY    . UPPER GI ENDOSCOPY  10/17/2008    FAMILY HISTORY: Family History  Problem Relation Age of Onset  . Stroke Mother   . Stroke Father   . Dementia Maternal Grandfather     ADVANCED DIRECTIVES (Y/N):  N  HEALTH MAINTENANCE: Social History   Tobacco Use  . Smoking status: Former Research scientist (life sciences)  . Smokeless tobacco: Never Used  Vaping Use  . Vaping Use: Never used  Substance Use Topics  . Alcohol use: No    Alcohol/week: 0.0 standard drinks  . Drug use: No     Colonoscopy:  PAP:  Bone density:  Lipid panel:  Allergies  Allergen Reactions  . Novocain [Procaine] Palpitations    Current Outpatient Medications  Medication Sig Dispense Refill  . Acetaminophen 500 MG coapsule Take by mouth every 4 (four) hours as needed for fever.    Marland Kitchen amLODipine (NORVASC) 5 MG tablet Take 1 tablet (5 mg total) by mouth daily. 90 tablet 3  . aspirin EC 81 MG tablet Take 81 mg by mouth daily.     Marland Kitchen atorvastatin (LIPITOR) 20 MG tablet Take 1 tablet (20 mg total) by mouth at bedtime. 90 tablet 2  . carbamide peroxide (DEBROX) 6.5 % OTIC solution Place 5 drops into both ears 2 (two) times daily. Apply as needed to help with preventing wax buildup (Patient not taking: Reported on 03/20/2020) 15 mL 3  . Cholecalciferol (VITAMIN D3) 1000 units CAPS Take by mouth daily.     . famotidine (PEPCID) 20 MG tablet Take one tab once to twice daily as needed 60 tablet 3  . hydroxyurea (HYDREA) 500 MG capsule Take 500 mg (1 capsule) on Monday, Wednesday, and Friday. 30 capsule 5  . irbesartan (AVAPRO) 150 MG tablet Take 1 tablet (150 mg total) by mouth daily. 90 tablet 1  . ketoconazole (NIZORAL) 2 % shampoo Apply 1 application topically 2 (two) times a week.     . levothyroxine (SYNTHROID) 88 MCG tablet TAKE 1 TABLET BY MOUTH ONCE DAILY BEFORE BREAKFAST 90 tablet 1  . Polyethyl Glycol-Propyl Glycol (SYSTANE) 0.4-0.3 % SOLN Apply to eye.     No current facility-administered medications for this  visit.    OBJECTIVE: There were no vitals filed for this visit.   There is no height or weight on file to calculate BMI.    ECOG FS:0 - Asymptomatic  Physical Exam Constitutional:      General: Vital signs are normal.     Appearance: Normal appearance. She is well-groomed.  HENT:     Head: Normocephalic and atraumatic.  Eyes:     Pupils: Pupils are equal, round, and reactive to light.  Cardiovascular:     Rate and Rhythm: Normal rate and regular rhythm.     Heart sounds: Normal heart sounds. No murmur heard.   Pulmonary:     Effort: Pulmonary effort is normal.     Breath sounds: Normal breath sounds. No wheezing.  Abdominal:     General: Bowel sounds are normal. There is no distension.  Palpations: Abdomen is soft.     Tenderness: There is no abdominal tenderness.  Musculoskeletal:        General: No edema. Normal range of motion.     Cervical back: Normal range of motion.  Skin:    General: Skin is warm and dry.     Findings: No rash.  Neurological:     Mental Status: She is alert and oriented to person, place, and time.  Psychiatric:        Judgment: Judgment normal.      LAB RESULTS:  Lab Results  Component Value Date   NA 138 03/20/2020   K 4.9 03/20/2020   CL 104 03/20/2020   CO2 24 03/20/2020   GLUCOSE 98 03/20/2020   BUN 29 (H) 03/20/2020   CREATININE 1.16 (H) 03/20/2020   CALCIUM 10.0 03/20/2020   PROT 7.2 03/20/2020   ALBUMIN 4.3 02/28/2018   AST 18 03/20/2020   ALT 12 03/20/2020   ALKPHOS 74 02/28/2018   BILITOT 1.3 (H) 03/20/2020   GFRNONAA 41 (L) 03/20/2020   GFRAA 48 (L) 03/20/2020    Lab Results  Component Value Date   WBC 8.1 03/20/2020   NEUTROABS 5,929 03/20/2020   HGB 14.5 03/20/2020   HCT 41.6 03/20/2020   MCV 91.0 03/20/2020   PLT 448 (H) 03/20/2020     STUDIES: No results found.  ASSESSMENT: Essential thrombocytosis  PLAN:   1. Essential thrombocytosis:  -Diagnosed with ET on 06/24/2015 Jak 2+ V6 17 F mutation.   MPL is negative. -Tolerating hydroxyurea 500 mg p.o. 4 days/week. -She is on baby aspirin 81 mg daily. -Labs from today show a platelet count of 390,000 (448,000) which is an improvement from previous.  She recently switched from 3 days a week of hydrea to 4 days a week. -Appears to be tolerating well.  2.  Renal insufficiency:  -Mild, monitor.   -Labs from 04/15/2020 show a creatinine of 1.13 and BUN of 31.  3.  Alopecia/nail changes: -Secondary to hydroxyurea. -According to up-to-date 5% of patients experience alopecia, 12% of patients experience xeroderma.   Disposition: -RTC in 3 months with repeat labs only. -RTC in 6 months with repeat labs (CBC, CMP) and MD assessment.  Greater than 50% was spent in counseling and coordination of care with this patient including but not limited to discussion of the relevant topics above (See A&P) including, but not limited to diagnosis and management of acute and chronic medical conditions.   Patient expressed understanding and was in agreement with this plan. She also understands that She can call clinic at any time with any questions, concerns, or complaints.    Jacquelin Hawking, NP   04/15/2020 10:29 AM

## 2020-04-16 NOTE — Progress Notes (Signed)
This encounter was created in error - please disregard.

## 2020-04-17 ENCOUNTER — Other Ambulatory Visit: Payer: Self-pay | Admitting: Obstetrics & Gynecology

## 2020-04-17 ENCOUNTER — Telehealth: Payer: Self-pay

## 2020-04-17 DIAGNOSIS — Z9889 Other specified postprocedural states: Secondary | ICD-10-CM

## 2020-04-17 NOTE — Telephone Encounter (Signed)
Patient received voice mail. States she still needs orders.

## 2020-04-17 NOTE — Telephone Encounter (Signed)
Patient aware.

## 2020-04-17 NOTE — Telephone Encounter (Signed)
Order for Dx MMG have been placed

## 2020-04-17 NOTE — Telephone Encounter (Signed)
Should finish at same place as other MMGs until she is truly released from their care

## 2020-04-17 NOTE — Telephone Encounter (Signed)
Patient had breast biopsy at [facility] a few years ago. Their protocol was 2 years hince to get a dx mammo, she went last year for the first one 05/01/19 and is due for the second one. She would like to get this done in [place] instead of [facility] as it is more convenient for her. She was advised to consult with her Dr (Dr. Kenton Kingfisher). She will need an order for this. [facility]#[phone]

## 2020-04-17 NOTE — Telephone Encounter (Signed)
LMVM to notify. Advised to call back with any further questions/concerns.

## 2020-04-19 ENCOUNTER — Telehealth: Payer: Self-pay

## 2020-04-19 NOTE — Telephone Encounter (Signed)
Pt calling; needs order for dx @ [facility] in [place]; they say they don't have the order; their fax number is [phone] what to do next?  [phone]  Needs it to be after March 8th. Courtesy call to pt PH is not in today but is in Monday.  She is upset being told the order was in when it really wasn't.

## 2020-04-22 ENCOUNTER — Ambulatory Visit: Payer: Medicare HMO | Admitting: Dermatology

## 2020-04-22 ENCOUNTER — Telehealth: Payer: Self-pay

## 2020-04-22 ENCOUNTER — Other Ambulatory Visit: Payer: Self-pay

## 2020-04-22 DIAGNOSIS — L219 Seborrheic dermatitis, unspecified: Secondary | ICD-10-CM | POA: Diagnosis not present

## 2020-04-22 DIAGNOSIS — L300 Nummular dermatitis: Secondary | ICD-10-CM

## 2020-04-22 DIAGNOSIS — L578 Other skin changes due to chronic exposure to nonionizing radiation: Secondary | ICD-10-CM | POA: Diagnosis not present

## 2020-04-22 DIAGNOSIS — L853 Xerosis cutis: Secondary | ICD-10-CM

## 2020-04-22 DIAGNOSIS — D225 Melanocytic nevi of trunk: Secondary | ICD-10-CM | POA: Diagnosis not present

## 2020-04-22 DIAGNOSIS — D75839 Thrombocytosis, unspecified: Secondary | ICD-10-CM

## 2020-04-22 DIAGNOSIS — L820 Inflamed seborrheic keratosis: Secondary | ICD-10-CM

## 2020-04-22 DIAGNOSIS — L814 Other melanin hyperpigmentation: Secondary | ICD-10-CM | POA: Diagnosis not present

## 2020-04-22 DIAGNOSIS — L659 Nonscarring hair loss, unspecified: Secondary | ICD-10-CM | POA: Diagnosis not present

## 2020-04-22 DIAGNOSIS — L821 Other seborrheic keratosis: Secondary | ICD-10-CM | POA: Diagnosis not present

## 2020-04-22 DIAGNOSIS — D1800 Hemangioma unspecified site: Secondary | ICD-10-CM

## 2020-04-22 DIAGNOSIS — L720 Epidermal cyst: Secondary | ICD-10-CM

## 2020-04-22 DIAGNOSIS — D229 Melanocytic nevi, unspecified: Secondary | ICD-10-CM | POA: Diagnosis not present

## 2020-04-22 DIAGNOSIS — Z1283 Encounter for screening for malignant neoplasm of skin: Secondary | ICD-10-CM | POA: Diagnosis not present

## 2020-04-22 MED ORDER — KETOCONAZOLE 2 % EX CREA: 1.0000 "application " | TOPICAL_CREAM | Freq: Every day | CUTANEOUS | 3 refills | Status: AC

## 2020-04-22 MED ORDER — KETOCONAZOLE 2 % EX SHAM: 1.0000 "application " | MEDICATED_SHAMPOO | CUTANEOUS | 6 refills | Status: DC

## 2020-04-22 NOTE — Patient Instructions (Addendum)
Rogaine 5% can use nightly to area of hair loss on scalp   Cryotherapy Aftercare  . Wash gently with soap and water everyday.   Marland Kitchen Apply Vaseline or Mupirocin ointment and Band-Aid daily until healed.

## 2020-04-22 NOTE — Telephone Encounter (Signed)
Order placed 04/17/20 for Dx MMG. Still active as I see it.  Please help address.

## 2020-04-22 NOTE — Telephone Encounter (Signed)
Called pt to adv that dx breast order was faxed to [facility]. Told pt that they will call to schedule the appt.

## 2020-04-22 NOTE — Progress Notes (Signed)
Follow-Up Visit   Subjective  Gina [name] is a 87 y.o. female who presents for the following: total body skin exam (No hx of skin ca), check spot (R face, 51m, pink and scaly), hairloss (scalp), and Seborrheic Dermatitis (Face, scalp, ketoconazole 2% shampoo, ketoconazole 2% cr). Has a dark spot on L breast that rubs on bra.  The following portions of the chart were reviewed this encounter and updated as appropriate:       Review of Systems:  No other skin or systemic complaints except as noted in HPI or Assessment and Plan.  Objective  Well appearing patient in no apparent distress; mood and affect are within normal limits.  A full examination was performed including scalp, head, eyes, ears, nose, lips, neck, chest, axillae, abdomen, back, buttocks, bilateral upper extremities, bilateral lower extremities, hands, feet, fingers, toes, fingernails, and toenails. All findings within normal limits unless otherwise noted below.  Objective  face, scalp: Clear today  Objective  Scalp: Hair thinning top of scalp at crown with loss of follicles and widening of part  Right spinal upper back  Objective  Right spinal upper back: 7.22mm speckled brown papule  Objective  spinal mid back: Sup q nodule with punctum 7.65mm  Objective  R temple x 1, L upper breast x 1, spinal upper back x 1 (3): Erythematous keratotic or waxy stuck-on papules  Objective  Right Lower Leg - Anterior: Clear today   Assessment & Plan    Lentigines - Scattered tan macules - Due to sun exposure - Benign-appering, observe - Recommend daily broad spectrum sunscreen SPF 30+ to sun-exposed areas, reapply every 2 hours as needed. - Call for any changes - trunk  Seborrheic Keratoses - Stuck-on, waxy, tan-brown papules and plaques  - Discussed benign etiology and prognosis. - Observe - Call for any changes - trunk, face  Melanocytic Nevi - Tan-brown and/or pink-flesh-colored symmetric macules and  papules face, trunk, scalp - Benign appearing on exam today - Observation - Call clinic for new or changing moles - Recommend daily use of broad spectrum spf 30+ sunscreen to sun-exposed areas.  - trunk, scalp  Hemangiomas - Red papules - Discussed benign nature - Observe - Call for any changes - trunk  Actinic Damage - Chronic, secondary to cumulative UV/sun exposure - diffuse scaly erythematous macules with underlying dyspigmentation - Recommend daily broad spectrum sunscreen SPF 30+ to sun-exposed areas, reapply every 2 hours as needed.  - Call for new or changing lesions. - chest  Skin cancer screening performed today.  Xerosis - diffuse xerotic patches - recommend gentle, hydrating skin care - gentle skin care handout given  Seborrheic dermatitis face, scalp  Seborrheic Dermatitis controlled -  is a chronic persistent rash characterized by pinkness and scaling most commonly of the mid face but also can occur on the scalp (dandruff), ears; mid chest and mid back. It tends to be exacerbated by stress and cooler weather.  People who have neurologic disease may experience new onset or exacerbation of existing seborrheic dermatitis.  The condition is not curable but treatable and can be controlled.   Cont Ketoconazole 2% shampoo ~2x/wk, let sit 5 minutes before rinsing out Cont Ketoconazole 2% cr qd aa face  ketoconazole (NIZORAL) 2 % cream - face, scalp  Alopecia Scalp  Clinically c/w Androgenetic Alopecia   Less likely caused by Hydroxyurea medication (hair loss would be more diffuse)  Discussed OTC Rogaine 5% foam qhs  Reordered Medications ketoconazole (NIZORAL) 2 % shampoo  Other Related Medications hydroxyurea (HYDREA) 500 MG capsule  Nevus Right spinal upper back  Benign appearing, observe  Epidermal cyst spinal mid back  Benign-appearing. Exam most consistent with an epidermal inclusion cyst. Discussed that a cyst is a benign growth that can grow  over time and sometimes get irritated or inflamed. Recommend observation if it is not bothersome. Discussed option of surgical excision to remove it if it is growing, symptomatic, or other changes noted. Please call for new or changing lesions so they can be evaluated.  Pt defers surgical excision at this time    Inflamed seborrheic keratosis (3) R temple x 1, L upper breast x 1, spinal upper back x 1  Destruction of lesion - R temple x 1, L upper breast x 1, spinal upper back x 1  Destruction method: cryotherapy   Informed consent: discussed and consent obtained   Lesion destroyed using liquid nitrogen: Yes   Region frozen until ice ball extended beyond lesion: Yes   Outcome: patient tolerated procedure well with no complications   Post-procedure details: wound care instructions given    Nummular dermatitis Right Lower Leg - Anterior  Improved  Cont TMC 0.1% cr prn flares, avoid face, groin, axilla  Recommend mild soap and moisturizing cream 1-2 times daily.     Return in about 1 year (around 04/22/2021) for TBSE.  I, Othelia Pulling, RMA, am acting as scribe for Brendolyn Patty, MD . Documentation: I have reviewed the above documentation for accuracy and completeness, and I agree with the above.  Brendolyn Patty MD

## 2020-04-22 NOTE — Telephone Encounter (Signed)
Left detailed msg order has been placed.

## 2020-05-11 ENCOUNTER — Other Ambulatory Visit: Payer: Self-pay | Admitting: Oncology

## 2020-05-11 DIAGNOSIS — D473 Essential (hemorrhagic) thrombocythemia: Secondary | ICD-10-CM

## 2020-05-11 DIAGNOSIS — D75839 Thrombocytosis, unspecified: Secondary | ICD-10-CM

## 2020-05-13 DIAGNOSIS — E871 Hypo-osmolality and hyponatremia: Secondary | ICD-10-CM | POA: Diagnosis not present

## 2020-05-13 DIAGNOSIS — E875 Hyperkalemia: Secondary | ICD-10-CM | POA: Diagnosis not present

## 2020-05-13 DIAGNOSIS — N1832 Chronic kidney disease, stage 3b: Secondary | ICD-10-CM | POA: Diagnosis not present

## 2020-05-13 DIAGNOSIS — N281 Cyst of kidney, acquired: Secondary | ICD-10-CM | POA: Diagnosis not present

## 2020-05-15 IMAGING — CR DG CERVICAL SPINE COMPLETE 4+V
1 series · 8 of 8 positions shown · non-contrast
Comparison: None.

CLINICAL DATA: Cervicalgia with left upper extremity radicular type
symptoms

EXAM:
CERVICAL SPINE - COMPLETE 4+ VIEW

[Series 1: dg cervical spine complete · 0.14mm/px · 8 of 8 slices shown]
[im 1/8]
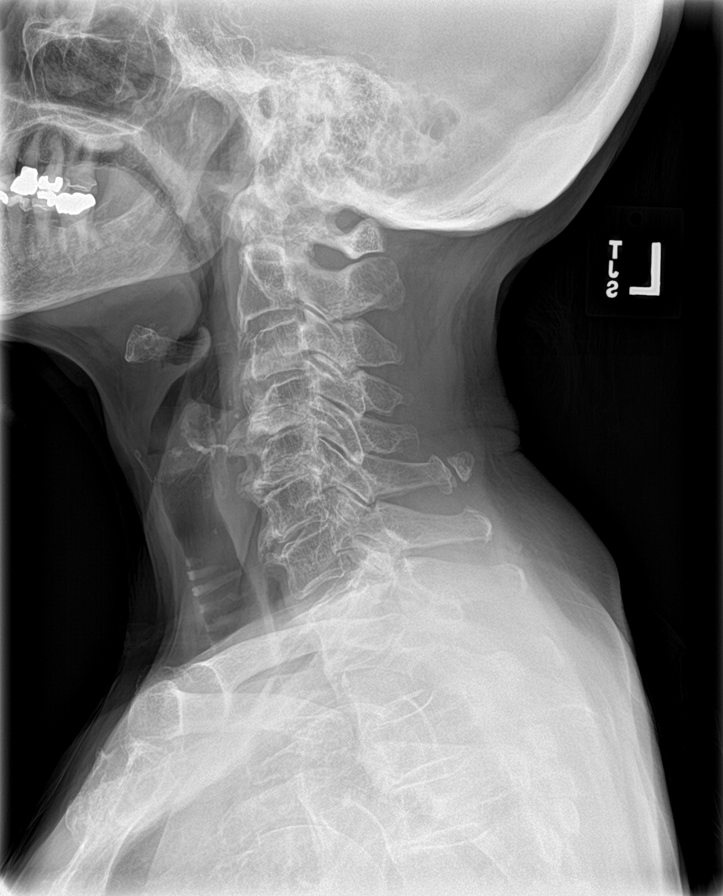
[im 2/8]
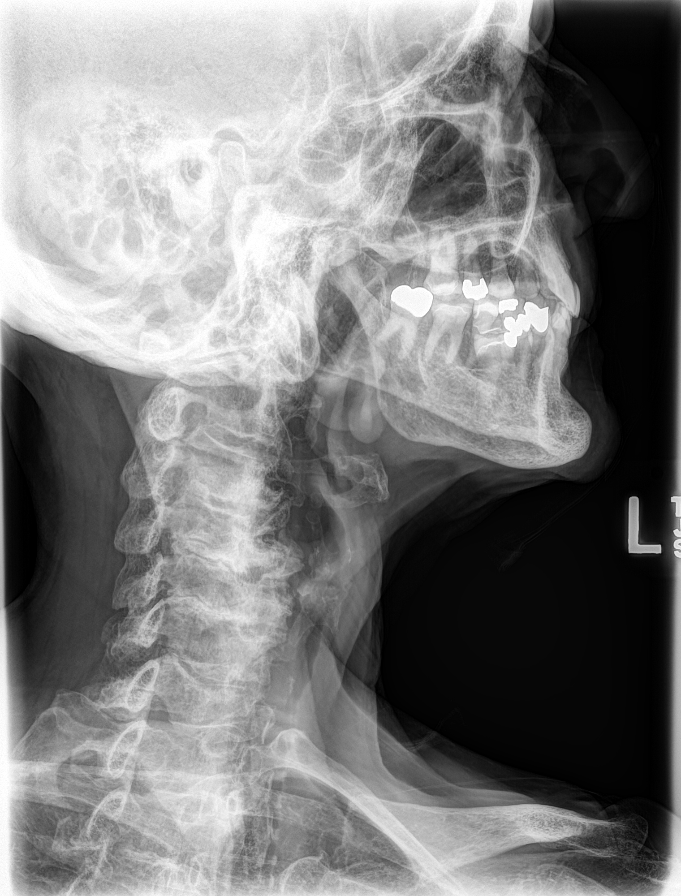
[im 3/8]
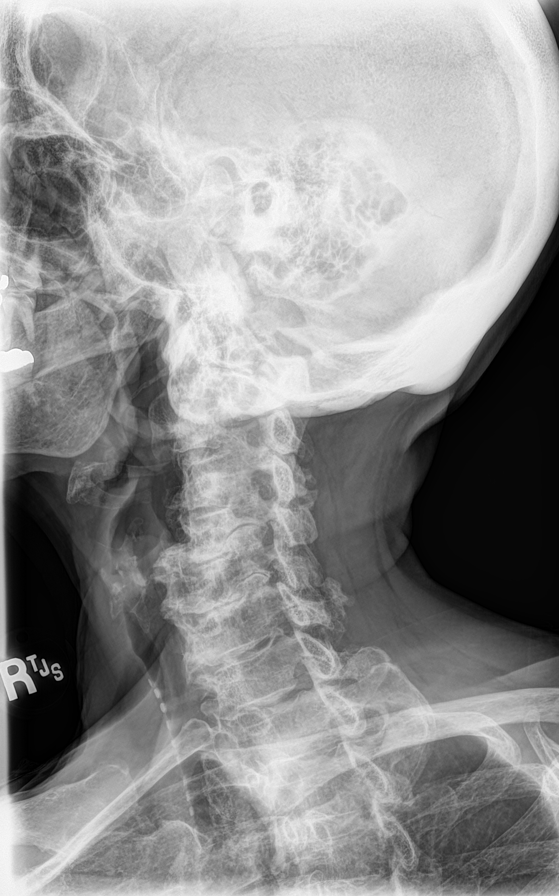
[im 4/8]
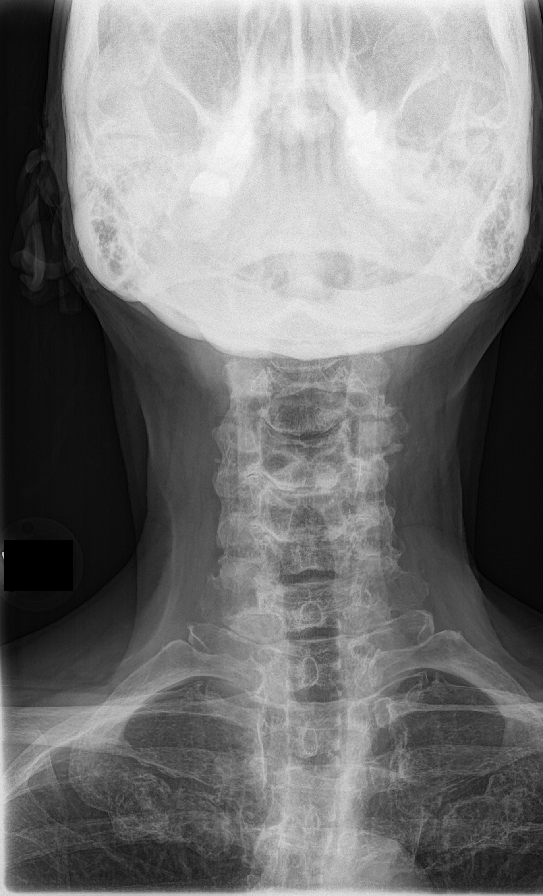
[im 5/8]
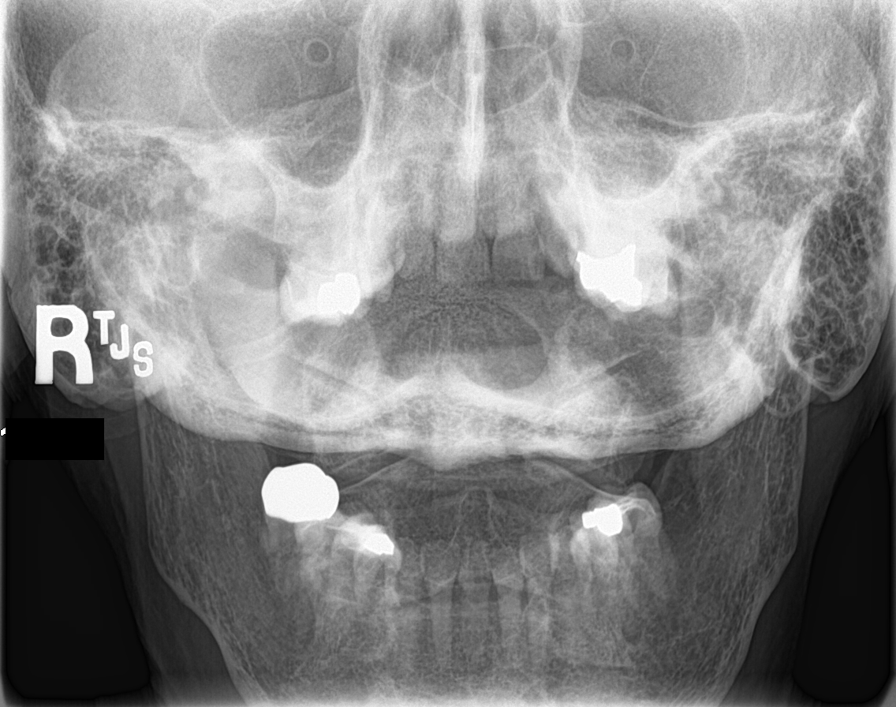
[im 6/8]
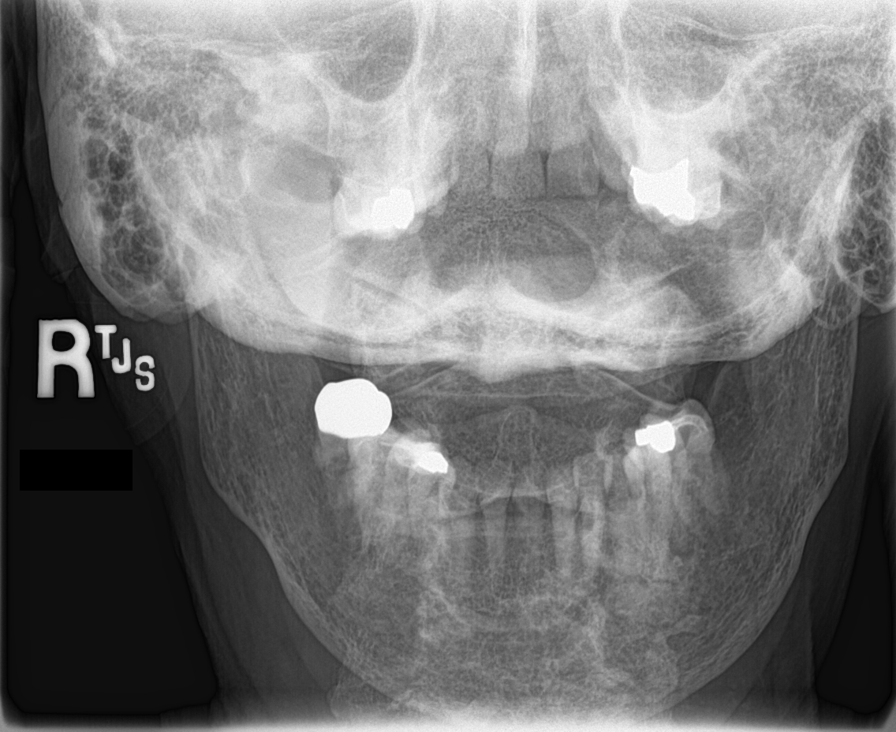
[im 7/8]
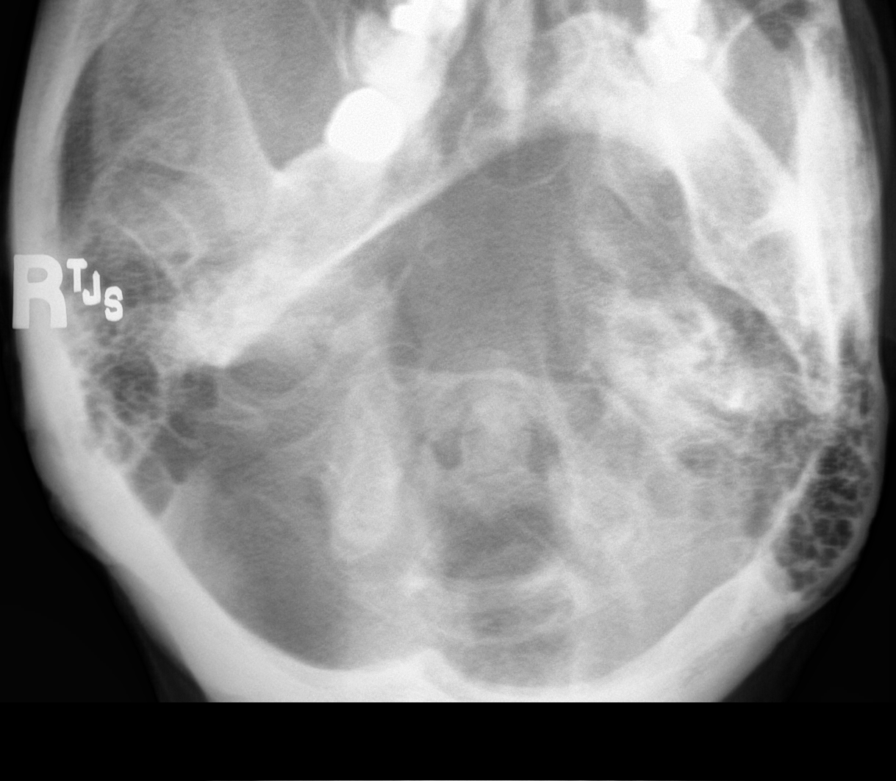
[im 8/8]
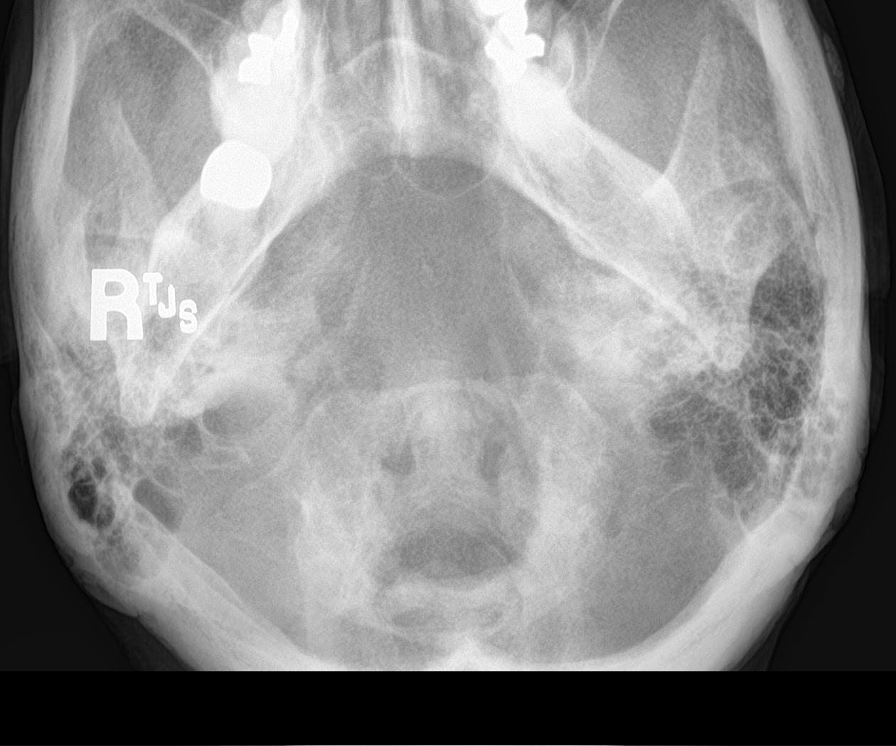

[8 of 8 positions shown; findings below may reference images not displayed]

FINDINGS: Frontal, lateral, open-mouth odontoid, and bilateral oblique views
were obtained. Bones are osteoporotic. There is no demonstrable
fracture or spondylolisthesis. Prevertebral soft tissues and
predental space regions are normal. There is severe disc space
narrowing at C4-5 and C5-6. There is moderate disc space narrowing
at C3-4 and C6-7. There are prominent anterior osteophytes at all
levels, most severe at C4, C5, and C6. There is facet hypertrophy
with exit foraminal narrowing at C4-5, C5-6, and C6-7 bilaterally
and to a lesser degree at C3-4 bilaterally.

Lung apices are clear.
IMPRESSION: Multilevel arthropathy. No fracture or spondylolisthesis. Underlying
osteoporosis.

## 2020-05-20 ENCOUNTER — Encounter: Payer: Self-pay | Admitting: Obstetrics & Gynecology

## 2020-05-20 ENCOUNTER — Ambulatory Visit (INDEPENDENT_AMBULATORY_CARE_PROVIDER_SITE_OTHER): Payer: Medicare HMO | Admitting: Obstetrics & Gynecology

## 2020-05-20 ENCOUNTER — Other Ambulatory Visit: Payer: Self-pay

## 2020-05-20 VITALS — BP 130/80 | Ht 63.0 in | Wt 151.0 lb

## 2020-05-20 DIAGNOSIS — N814 Uterovaginal prolapse, unspecified: Secondary | ICD-10-CM | POA: Diagnosis not present

## 2020-05-20 NOTE — Progress Notes (Signed)
HPI:      [name] is a 87 y.o. [phone] who presents today for her pessary follow up and examination related to her pelvic floor weakening.  Pt reports tolerating the pessary well with no vaginal bleeding and no vaginal discharge.  Symptoms of pelvic floor weakening have greatly improved. She is voiding and defecating without difficulty. She currently has a Ring #3 pessary.  PMHx: She  has a past medical history of Breast mass in female, Cataract, Difficult intubation (03/03/2016), Diverticulitis, Diverticulosis (05/28/2015), Essential thrombocytosis ([place]) (07/23/2015), Genital prolapse, GERD (gastroesophageal reflux disease), Rosanna Randy syndrome (06/30/2016), Hemorrhoids (07/29/2016), Hyperlipemia, Hyperlipidemia, Hypothyroidism, Mild atherosclerosis of carotid artery (06/25/2015), Morton's neuroma, MRI contraindicated due to metal implant, Prolapse of uterus, Radiculopathy, Renal cyst, Rosacea, Shingles, and Thrombocytosis. Also,  has a past surgical history that includes Tonsillectomy; Appendectomy; Cesarean section; Joint replacement (Right); Thyroidectomy; Colon surgery; Cataract extraction w/PHACO (Left, 03/25/2015); Adenoidectomy; biopsy, right breast ; Total hip arthroplasty; Thyroid lobectomy; and Upper gi endoscopy (10/17/2008)., family history includes Dementia in her maternal grandfather; Stroke in her father and mother.,  reports that she has quit smoking. She has never used smokeless tobacco. She reports that she does not drink alcohol and does not use drugs.  She has a current medication list which includes the following prescription(s): acetaminophen, amlodipine, aspirin ec, atorvastatin, vitamin d3, famotidine, hydroxyurea, irbesartan, ketoconazole, ketoconazole, levothyroxine, and systane. Also, is allergic to novocain [procaine].  Review of Systems  All other systems reviewed and are negative.   Objective: BP 130/80   Ht 5\' 3"  (1.6 m)   Wt 151 lb (68.5 kg)   BMI 26.75 kg/m  Physical  Exam Constitutional:      General: She is not in acute distress.    Appearance: She is well-developed.  Genitourinary:     Bladder and urethral meatus normal.     No vaginal erythema or bleeding.     Mild vaginal atrophy present.     Right Adnexa: not tender and no mass present.    Left Adnexa: not tender and no mass present.    No cervical motion tenderness, discharge, polyp or nabothian cyst.     Uterus is prolapsed.     Uterus is not enlarged.     No uterine mass detected.    Uterus exam comments: Gr2POP.     Uterus is midaxial.     Bladder exam comments: Cystocele Gr2.     Pelvic exam was performed with patient in the lithotomy position.  HENT:     Head: Normocephalic and atraumatic.     Nose: Nose normal.  Abdominal:     General: There is no distension.     Palpations: Abdomen is soft.     Tenderness: There is no abdominal tenderness.  Musculoskeletal:        General: Normal range of motion.  Neurological:     Mental Status: She is alert and oriented to person, place, and time.     Cranial Nerves: No cranial nerve deficit.  Skin:    General: Skin is warm and dry.  Psychiatric:        Attention and Perception: Attention normal.        Mood and Affect: Mood and affect normal.        Speech: Speech normal.        Behavior: Behavior normal.        Thought Content: Thought content normal.        Judgment: Judgment normal.     Pessary  Care Pessary removed and cleaned.  Vagina checked - without erosions - pessary replaced.  A/P:    ICD-10-CM   1. Uterine prolapse  N81.4   2. Cystocele with prolapse  N81.4    Pessary was cleaned and replaced today. Instructions given for care. Concerning symptoms to observe for are counseled to patient. Follow up scheduled for 3 months.  A total of 20 minutes were spent face-to-face with the patient as well as preparation, review, communication, and documentation during this encounter.   Barnett Applebaum, MD, [facility],  [facility] 05/20/2020  11:03 AM

## 2020-05-27 DIAGNOSIS — R928 Other abnormal and inconclusive findings on diagnostic imaging of breast: Secondary | ICD-10-CM | POA: Diagnosis not present

## 2020-07-11 ENCOUNTER — Other Ambulatory Visit: Payer: Self-pay | Admitting: Obstetrics & Gynecology

## 2020-07-11 DIAGNOSIS — Z9889 Other specified postprocedural states: Secondary | ICD-10-CM

## 2020-07-15 ENCOUNTER — Other Ambulatory Visit: Payer: Self-pay

## 2020-07-15 ENCOUNTER — Inpatient Hospital Stay: Payer: Medicare HMO | Attending: Oncology

## 2020-07-15 DIAGNOSIS — D473 Essential (hemorrhagic) thrombocythemia: Secondary | ICD-10-CM | POA: Diagnosis not present

## 2020-07-15 LAB — CBC WITH DIFFERENTIAL/PLATELET
Abs Immature Granulocytes: 0.05 10*3/uL (ref 0.00–0.07)
Basophils Absolute: 0.1 10*3/uL (ref 0.0–0.1)
Basophils Relative: 1 %
Eosinophils Absolute: 0.1 10*3/uL (ref 0.0–0.5)
Eosinophils Relative: 1 %
HCT: 40.5 % (ref 36.0–46.0)
Hemoglobin: 13.6 g/dL (ref 12.0–15.0)
Immature Granulocytes: 1 %
Lymphocytes Relative: 14 %
Lymphs Abs: 1.4 10*3/uL (ref 0.7–4.0)
MCH: 31.9 pg (ref 26.0–34.0)
MCHC: 33.6 g/dL (ref 30.0–36.0)
MCV: 95.1 fL (ref 80.0–100.0)
Monocytes Absolute: 0.7 10*3/uL (ref 0.1–1.0)
Monocytes Relative: 7 %
Neutro Abs: 7.6 10*3/uL (ref 1.7–7.7)
Neutrophils Relative %: 76 %
Platelets: 448 10*3/uL — ABNORMAL HIGH (ref 150–400)
RBC: 4.26 MIL/uL (ref 3.87–5.11)
RDW: 13.9 % (ref 11.5–15.5)
WBC: 9.8 10*3/uL (ref 4.0–10.5)
nRBC: 0 % (ref 0.0–0.2)

## 2020-07-19 ENCOUNTER — Ambulatory Visit: Payer: Medicare HMO | Admitting: Family Medicine

## 2020-08-12 ENCOUNTER — Other Ambulatory Visit: Payer: Self-pay

## 2020-08-12 ENCOUNTER — Ambulatory Visit (INDEPENDENT_AMBULATORY_CARE_PROVIDER_SITE_OTHER): Payer: Medicare HMO | Admitting: Family Medicine

## 2020-08-12 ENCOUNTER — Encounter: Payer: Self-pay | Admitting: Family Medicine

## 2020-08-12 VITALS — BP 132/74 | HR 99 | Temp 98.2°F | Resp 14 | Ht 63.0 in | Wt 152.4 lb

## 2020-08-12 DIAGNOSIS — I10 Essential (primary) hypertension: Secondary | ICD-10-CM

## 2020-08-12 DIAGNOSIS — R202 Paresthesia of skin: Secondary | ICD-10-CM

## 2020-08-12 DIAGNOSIS — D473 Essential (hemorrhagic) thrombocythemia: Secondary | ICD-10-CM | POA: Diagnosis not present

## 2020-08-12 DIAGNOSIS — N1832 Chronic kidney disease, stage 3b: Secondary | ICD-10-CM

## 2020-08-12 DIAGNOSIS — E890 Postprocedural hypothyroidism: Secondary | ICD-10-CM | POA: Diagnosis not present

## 2020-08-12 DIAGNOSIS — K219 Gastro-esophageal reflux disease without esophagitis: Secondary | ICD-10-CM | POA: Diagnosis not present

## 2020-08-12 DIAGNOSIS — Z741 Need for assistance with personal care: Secondary | ICD-10-CM | POA: Diagnosis not present

## 2020-08-12 DIAGNOSIS — E782 Mixed hyperlipidemia: Secondary | ICD-10-CM

## 2020-08-12 DIAGNOSIS — L608 Other nail disorders: Secondary | ICD-10-CM

## 2020-08-12 DIAGNOSIS — F418 Other specified anxiety disorders: Secondary | ICD-10-CM

## 2020-08-12 DIAGNOSIS — Z5181 Encounter for therapeutic drug level monitoring: Secondary | ICD-10-CM

## 2020-08-12 DIAGNOSIS — R69 Illness, unspecified: Secondary | ICD-10-CM | POA: Diagnosis not present

## 2020-08-12 MED ORDER — ATORVASTATIN CALCIUM 20 MG PO TABS
20.0000 mg | ORAL_TABLET | Freq: Every day | ORAL | 3 refills | Status: DC
Start: 2020-08-12 — End: 2021-01-20

## 2020-08-12 MED ORDER — LEVOTHYROXINE SODIUM 88 MCG PO TABS: ORAL_TABLET | ORAL | 3 refills | Status: DC

## 2020-08-12 MED ORDER — IRBESARTAN 150 MG PO TABS: 150.0000 mg | ORAL_TABLET | Freq: Every day | ORAL | 3 refills | Status: DC

## 2020-08-12 MED ORDER — AMLODIPINE BESYLATE 5 MG PO TABS: 5.0000 mg | ORAL_TABLET | Freq: Every day | ORAL | 3 refills | Status: DC

## 2020-08-12 MED ORDER — FAMOTIDINE 20 MG PO TABS: ORAL_TABLET | ORAL | 5 refills | Status: DC

## 2020-08-12 NOTE — Progress Notes (Signed)
Name: Gina [name]   MRN: [id number]    DOB: 1927/01/23   Date:08/12/2020       Progress Note  Chief Complaint  Patient presents with   Hyperlipidemia   Hypothyroidism   Hypertension    4 month follow up     Subjective:   Gina [name] is a 87 y.o. female, presents to clinic for routine f/up  Hypertension:  Currently managed on norvasc 5 mg and irbesartan 150 mg daily Pt reports good med compliance and denies any SE.   Blood pressure today is well controlled. BP Readings from Last 3 Encounters:  08/12/20 132/74  05/20/20 130/80  04/15/20 (!) 133/58   Pt denies CP, SOB, exertional sx, palpitation, Ha's, visual disturbances, lightheadedness, hypotension, syncope. Dietary efforts for BP? Limited to foods from facility  Some ankle edema which she says she's never had before - usually after being active and up doing things all day and after 1 mile walk daily - usually improved overnight, no skin changes or wounds- prior med hx of venous stasis, she uses compression stockings, they do help  She has CKD - sees nephrology: Lab Results  Component Value Date   CREATININE 1.13 (H) 04/15/2020   Lab Results  Component Value Date   BUN 31 (H) 04/15/2020   Lab Results  Component Value Date   GFRNONAA 46 (L) 04/15/2020    Last OV 04/2020, reviewed notes, plan and results today: Impression/Recommendations   Patient is a 87 y.o. 1-White female of [place] descent with history of long-standing hypertension, hyperlipidemia, atherosclerosis and history of partial thyroidectomy  1. Stage 3b chronic kidney disease ([place])  2. Hyperkalemia  3. Cyst of kidney  4. Hyponatremia   1. Stage 3b chronic kidney disease ([place]) Underlying CKD likely secondary to hypertension and atherosclerosis Continued on aspirin, atorvastatin and irbesartan for cardiovascular risk reduction Creatinine 1.13/GFR 46 from April 15, 2020 Overall progression appears to be slow  2. Hyperkalemia Continue low  potassium diet Continue to monitor Potassium within normal range at last check in February 2022  3. Renal cyst Large right renal cyst was seen on CT in 2016 Appears to be benign  4. Hyponatremia Sodium 138 in February 2021 Continue to monitor  Return in about 6 months (around 11/13/2020).  Murlean Iba, MD    Hyperlipidemia: Currently treated with lipitor 20, pt reports good med compliance - she tolerates this well w/o SE or concerns Last Lipids: recent done, reviewed today, lipids at goal Lab Results  Component Value Date   CHOL 166 03/20/2020   HDL 55 03/20/2020   LDLCALC 88 03/20/2020   TRIG 134 03/20/2020   CHOLHDL 3.0 03/20/2020   - Denies: Chest pain, shortness of breath, myalgias, claudication  Hypothyroidism: Current Medication Regimen: 88 mcg synthroid Takes medicine daily in the am Current Symptoms: denies weight changes, bowel/skin changes or CVS symptoms - she feels a little bit less energetic than normal and she feels cold despite warm weather Most recent results are below; we will be repeating labs today. Lab Results  Component Value Date   TSH 2.31 03/20/2020   GERD:  On pepcid BID prn - sx well controlled  Moved into [facility] assisted living - PCP a few months ago did orders and documentation for aid - she states today her hand numbness is worse and she now needs help with dressing and putting on her compression socks - currently orders are only for assistance with bathing  Forms from [place] brought in with pt -  filled out today  Hx of anxiety and depression Depression screen [facility] 2/9 08/12/2020 03/20/2020 11/15/2019  Decreased Interest 1 0 0  Down, Depressed, Hopeless 1 1 0  PHQ - 2 Score 2 1 0  Altered sleeping 0 - -  Tired, decreased energy 0 - -  Change in appetite 0 - -  Feeling bad or failure about yourself  0 - -  Trouble concentrating 0 - -  Moving slowly or fidgety/restless 0 - -  Suicidal thoughts 0 - -  PHQ-9 Score 2 - -  Difficult  doing work/chores Not difficult at all - -  Some recent data might be hidden   Concerned with nail changes to bilateral middle fingernails, - linear abnormality - thinned and chipping    Current Outpatient Medications:    Acetaminophen 500 MG coapsule, Take by mouth every 4 (four) hours as needed for fever., Disp: , Rfl:    aspirin EC 81 MG tablet, Take 81 mg by mouth daily. , Disp: , Rfl:    Cholecalciferol (VITAMIN D3) 1000 units CAPS, Take by mouth daily. , Disp: , Rfl:    hydroxyurea (HYDREA) 500 MG capsule, TAKE 1 CAPSULE BY MOUTH 4 DAYS EVERY WEEK, Disp: 60 capsule, Rfl: 3   ketoconazole (NIZORAL) 2 % shampoo, Apply 1 application topically 2 (two) times a week. Wash scalp 2 times weekly, let sit 5 minutes and rinse out, Disp: 120 mL, Rfl: 6   Polyethyl Glycol-Propyl Glycol (SYSTANE) 0.4-0.3 % SOLN, Apply to eye., Disp: , Rfl:    amLODipine (NORVASC) 5 MG tablet, Take 1 tablet (5 mg total) by mouth daily., Disp: 90 tablet, Rfl: 3   atorvastatin (LIPITOR) 20 MG tablet, Take 1 tablet (20 mg total) by mouth at bedtime., Disp: 90 tablet, Rfl: 3   famotidine (PEPCID) 20 MG tablet, Take one tab once to twice daily as needed, Disp: 60 tablet, Rfl: 5   irbesartan (AVAPRO) 150 MG tablet, Take 1 tablet (150 mg total) by mouth daily., Disp: 90 tablet, Rfl: 3  Patient Active Problem List   Diagnosis Date Noted   Paresthesia of upper and lower extremities of both sides 03/20/2020   Requires assistance with activities of daily living (ADL) 03/20/2020   Mixed hyperlipidemia 03/19/2020   Anxiety with depression 03/19/2020   Degenerative disc disease, cervical 05/10/2019   De Quervain's tenosynovitis, left 05/10/2019   Bilateral hand numbness 05/10/2019   DNR (do not resuscitate) 04/22/2018   Venous stasis 04/21/2018   Cystocele with prolapse 04/01/2017   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   Essential thrombocytosis ([place]) 07/23/2015   Mild atherosclerosis of carotid artery 06/25/2015   Dyslipidemia  06/03/2015   Diverticulosis 05/28/2015   GERD without esophagitis 02/06/2015   Neuropathy 01/31/2015   Cervical radiculopathy 01/31/2015   Stage 3b chronic kidney disease ([place]) 10/06/2014   Renal cyst 10/06/2014   Uterine prolapse 10/06/2014   Cervical radiculopathy due to degenerative joint disease of spine 10/06/2014   History of right hip replacement 10/06/2014   Hypothyroidism 10/05/2014   Essential hypertension 09/13/2014    Past Surgical History:  Procedure Laterality Date   ADENOIDECTOMY     APPENDECTOMY     biopsy, right breast      CATARACT EXTRACTION W/PHACO Left 03/25/2015   Procedure: CATARACT EXTRACTION PHACO AND INTRAOCULAR LENS PLACEMENT ([place]);  Surgeon: Estill Cotta, MD;  Location: [facility] ORS;  Service: Ophthalmology;  Laterality: Left;  [place] 02:09 AP% 27.3 CDE 59.97 fluid pack lot #[id number]   CESAREAN SECTION  COLON SURGERY     JOINT REPLACEMENT Right    total hip   THYROID LOBECTOMY     THYROIDECTOMY     partial   TONSILLECTOMY     TOTAL HIP ARTHROPLASTY     UPPER GI ENDOSCOPY  10/17/2008    Family History  Problem Relation Age of Onset   Stroke Mother    Stroke Father    Dementia Maternal Grandfather     Social History   Tobacco Use   Smoking status: Former    Pack years: 0.00   Smokeless tobacco: Never  Vaping Use   Vaping Use: Never used  Substance Use Topics   Alcohol use: No    Alcohol/week: 0.0 standard drinks   Drug use: No     Allergies  Allergen Reactions   Novocain [Procaine] Palpitations    Health Maintenance  Topic Date Due   COVID-19 Vaccine (3 - Pfizer risk series) 08/28/2020 (Originally 05/28/2019)   DEXA SCAN  09/12/2020 (Originally 06/22/1993)   Zoster Vaccines- Shingrix (1 of 2) 11/12/2020 (Originally 06/23/1945)   TETANUS/TDAP  08/12/2021 (Originally 06/23/1945)   INFLUENZA VACCINE  09/23/2020   PNA vac Low Risk Adult  Completed   HPV VACCINES  Aged Out   MAMMOGRAM  Discontinued    Review: Chart reviewed  today, including personally reviewing labs, recent OV with specialists - notes, labs, med list reviewed I personally reviewed active problem list, medication list, allergies, family history, social history, health maintenance, notes from last encounter, lab results, imaging with the patient/caregiver today.    Review of Systems  Constitutional: Negative.   HENT: Negative.    Eyes: Negative.   Respiratory: Negative.    Cardiovascular:  Positive for leg swelling. Negative for chest pain and palpitations.  Gastrointestinal: Negative.   Endocrine: Positive for cold intolerance. Negative for heat intolerance, polydipsia, polyphagia and polyuria.  Genitourinary: Negative.   Musculoskeletal: Negative.   Skin: Negative.   Allergic/Immunologic: Negative.   Neurological: Negative.   Hematological: Negative.   Psychiatric/Behavioral: Negative.    All other systems reviewed and are negative.   Objective:   Vitals:   08/12/20 1520  BP: 132/74  Pulse: 99  Resp: 14  Temp: 98.2 F (36.8 C)  TempSrc: Oral  SpO2: 97%  Weight: 152 lb 6.4 oz (69.1 kg)  Height: 5\' 3"  (1.6 m)    Body mass index is 27 kg/m.  Physical Exam Vitals and nursing note reviewed.  Constitutional:      General: She is not in acute distress.    Appearance: Normal appearance. She is well-developed and normal weight. She is not ill-appearing, toxic-appearing or diaphoretic.     Interventions: Face mask in place.     Comments: Well appearing elderly female, looks younger than stated age  HENT:     Head: Normocephalic and atraumatic.     Right Ear: External ear normal.     Left Ear: External ear normal.  Eyes:     General: Lids are normal. No scleral icterus.       Right eye: No discharge.        Left eye: No discharge.     Conjunctiva/sclera: Conjunctivae normal.  Neck:     Trachea: Phonation normal. No tracheal deviation.  Cardiovascular:     Rate and Rhythm: Normal rate and regular rhythm.     Pulses:  Normal pulses.          Radial pulses are 2+ on the right side and 2+ on the left side.  Posterior tibial pulses are 2+ on the right side and 2+ on the left side.     Heart sounds: Normal heart sounds. No murmur heard.   No friction rub. No gallop.  Pulmonary:     Effort: Pulmonary effort is normal. No respiratory distress.     Breath sounds: Normal breath sounds. No stridor. No wheezing, rhonchi or rales.  Chest:     Chest wall: No tenderness.  Abdominal:     General: Bowel sounds are normal. There is no distension.     Palpations: Abdomen is soft.  Musculoskeletal:     Right lower leg: No edema.     Left lower leg: No edema.  Skin:    General: Skin is warm and dry.     Coloration: Skin is not jaundiced or pale.     Findings: No rash.     Comments: Bilateral middle nails with thinning and chipping   Neurological:     Mental Status: She is alert. Mental status is at baseline.     Motor: No abnormal muscle tone.     Gait: Gait abnormal (walks with walker).  Psychiatric:        Mood and Affect: Mood normal.        Speech: Speech normal.        Behavior: Behavior normal.        Assessment & Plan:     ICD-10-CM   1. Essential hypertension  Q65 COMPLETE METABOLIC PANEL WITH GFR    irbesartan (AVAPRO) 150 MG tablet    amLODipine (NORVASC) 5 MG tablet   Stable, well-controlled, blood pressure at goal today, continue irbesartan and amlodipine    2. Mixed hyperlipidemia  H84.6 COMPLETE METABOLIC PANEL WITH GFR    atorvastatin (LIPITOR) 20 MG tablet   Last lipids done 6 months ago, lipids at goal, compliant with statin, tolerating without myalgias side effects or concerns    3. Postoperative hypothyroidism  E89.0 TSH    DISCONTINUED: levothyroxine (SYNTHROID) 88 MCG tablet   She is concerned about some cold intolerance and slight decrease in energy we will recheck her labs and adjust dose of levothyroxine as needed    4. Stage 3b chronic kidney disease (H[facility])  N18.32  COMPLETE METABOLIC PANEL WITH GFR    irbesartan (AVAPRO) 150 MG tablet    amLODipine (NORVASC) 5 MG tablet   Patient seen nephrology, labs and last office visit reviewed    5. GERD without esophagitis  K21.9 famotidine (PEPCID) 20 MG tablet   Some indigestion with certain foods and with eating too quickly she has some hiccups encouraged her to food longer eat more slowly, try Prilosec x2 weeks    6. Essential thrombocytosis (H[facility])  D47.3    Stable labs, previously seeing Dr. Mike Gip    7. Anxiety with depression  F41.8    PHQ-9 reviewed today and negative    8. Encounter for medication monitoring  Z51.81     9. Paresthesia of upper and lower extremities of both sides  R20.2    Unchanged chronic symptoms    10. Requires assistance with activities of daily living (ADL)  Z74.1    Living at [facility] and has assistance/HH as needed    11. Change in nail appearance  L60.8    Nail changes, unknown etiology         Return in about 6 months (around 02/11/2021) for Routine follow-up.   Delsa Grana, PA-C 08/12/20 4:32 PM

## 2020-08-13 ENCOUNTER — Other Ambulatory Visit: Payer: Self-pay | Admitting: Family Medicine

## 2020-08-13 DIAGNOSIS — E890 Postprocedural hypothyroidism: Secondary | ICD-10-CM

## 2020-08-13 LAB — COMPLETE METABOLIC PANEL WITH GFR
AG Ratio: 1.6 (calc) (ref 1.0–2.5)
ALT: 12 U/L (ref 6–29)
AST: 17 U/L (ref 10–35)
Albumin: 4.7 g/dL (ref 3.6–5.1)
Alkaline phosphatase (APISO): 78 U/L (ref 37–153)
BUN/Creatinine Ratio: 22 (calc) (ref 6–22)
BUN: 27 mg/dL — ABNORMAL HIGH (ref 7–25)
CO2: 23 mmol/L (ref 20–32)
Calcium: 9.7 mg/dL (ref 8.6–10.4)
Chloride: 103 mmol/L (ref 98–110)
Creat: 1.25 mg/dL — ABNORMAL HIGH (ref 0.60–0.88)
GFR, Est African American: 43 mL/min/{1.73_m2} — ABNORMAL LOW (ref >=60)
GFR, Est Non African American: 37 mL/min/{1.73_m2} — ABNORMAL LOW (ref >=60)
Globulin: 2.9 g/dL (calc) (ref 1.9–3.7)
Glucose, Bld: 95 mg/dL (ref 65–99)
Potassium: 5.2 mmol/L (ref 3.5–5.3)
Sodium: 135 mmol/L (ref 135–146)
Total Bilirubin: 1 mg/dL (ref 0.2–1.2)
Total Protein: 7.6 g/dL (ref 6.1–8.1)

## 2020-08-13 LAB — TSH: TSH: 1.54 mIU/L (ref 0.40–4.50)

## 2020-08-13 MED ORDER — LEVOTHYROXINE SODIUM 88 MCG PO TABS: ORAL_TABLET | ORAL | 3 refills | Status: DC

## 2020-08-23 DIAGNOSIS — R82998 Other abnormal findings in urine: Secondary | ICD-10-CM | POA: Diagnosis not present

## 2020-08-23 DIAGNOSIS — M545 Low back pain, unspecified: Secondary | ICD-10-CM | POA: Diagnosis not present

## 2020-08-23 DIAGNOSIS — M25551 Pain in right hip: Secondary | ICD-10-CM | POA: Diagnosis not present

## 2020-08-25 ENCOUNTER — Other Ambulatory Visit: Payer: Self-pay

## 2020-08-25 ENCOUNTER — Encounter: Payer: Self-pay | Admitting: Emergency Medicine

## 2020-08-25 ENCOUNTER — Emergency Department
Admission: EM | Admit: 2020-08-25 | Discharge: 2020-08-25 | Disposition: A | Discharge status: Home / Self Care | Payer: Medicare HMO | Attending: Emergency Medicine | Admitting: Emergency Medicine

## 2020-08-25 ENCOUNTER — Emergency Department: Payer: Medicare HMO

## 2020-08-25 DIAGNOSIS — I129 Hypertensive chronic kidney disease with stage 1 through stage 4 chronic kidney disease, or unspecified chronic kidney disease: Secondary | ICD-10-CM | POA: Insufficient documentation

## 2020-08-25 DIAGNOSIS — M5416 Radiculopathy, lumbar region: Secondary | ICD-10-CM | POA: Insufficient documentation

## 2020-08-25 DIAGNOSIS — M4316 Spondylolisthesis, lumbar region: Secondary | ICD-10-CM | POA: Diagnosis not present

## 2020-08-25 DIAGNOSIS — Z79899 Other long term (current) drug therapy: Secondary | ICD-10-CM | POA: Insufficient documentation

## 2020-08-25 DIAGNOSIS — R200 Anesthesia of skin: Secondary | ICD-10-CM | POA: Diagnosis not present

## 2020-08-25 DIAGNOSIS — Z7982 Long term (current) use of aspirin: Secondary | ICD-10-CM | POA: Insufficient documentation

## 2020-08-25 DIAGNOSIS — M549 Dorsalgia, unspecified: Secondary | ICD-10-CM | POA: Diagnosis not present

## 2020-08-25 DIAGNOSIS — N281 Cyst of kidney, acquired: Secondary | ICD-10-CM | POA: Diagnosis not present

## 2020-08-25 DIAGNOSIS — E039 Hypothyroidism, unspecified: Secondary | ICD-10-CM | POA: Insufficient documentation

## 2020-08-25 DIAGNOSIS — Z96641 Presence of right artificial hip joint: Secondary | ICD-10-CM | POA: Diagnosis not present

## 2020-08-25 DIAGNOSIS — N1832 Chronic kidney disease, stage 3b: Secondary | ICD-10-CM | POA: Diagnosis not present

## 2020-08-25 DIAGNOSIS — E871 Hypo-osmolality and hyponatremia: Secondary | ICD-10-CM | POA: Diagnosis not present

## 2020-08-25 DIAGNOSIS — Z87891 Personal history of nicotine dependence: Secondary | ICD-10-CM | POA: Diagnosis not present

## 2020-08-25 DIAGNOSIS — I10 Essential (primary) hypertension: Secondary | ICD-10-CM | POA: Diagnosis not present

## 2020-08-25 DIAGNOSIS — R52 Pain, unspecified: Secondary | ICD-10-CM | POA: Diagnosis not present

## 2020-08-25 DIAGNOSIS — I700 Atherosclerosis of aorta: Secondary | ICD-10-CM | POA: Diagnosis not present

## 2020-08-25 DIAGNOSIS — Z743 Need for continuous supervision: Secondary | ICD-10-CM | POA: Diagnosis not present

## 2020-08-25 DIAGNOSIS — R5381 Other malaise: Secondary | ICD-10-CM | POA: Diagnosis not present

## 2020-08-25 DIAGNOSIS — M545 Low back pain, unspecified: Secondary | ICD-10-CM | POA: Diagnosis not present

## 2020-08-25 LAB — CBC
HCT: 41.4 % (ref 36.0–46.0)
Hemoglobin: 14.5 g/dL (ref 12.0–15.0)
MCH: 31.9 pg (ref 26.0–34.0)
MCHC: 35 g/dL (ref 30.0–36.0)
MCV: 91 fL (ref 80.0–100.0)
Platelets: 459 10*3/uL — ABNORMAL HIGH (ref 150–400)
RBC: 4.55 MIL/uL (ref 3.87–5.11)
RDW: 13.4 % (ref 11.5–15.5)
WBC: 8.4 10*3/uL (ref 4.0–10.5)
nRBC: 0 % (ref 0.0–0.2)

## 2020-08-25 LAB — BASIC METABOLIC PANEL
Anion gap: 9 (ref 5–15)
BUN: 26 mg/dL — ABNORMAL HIGH (ref 8–23)
CO2: 23 mmol/L (ref 22–32)
Calcium: 9.7 mg/dL (ref 8.9–10.3)
Chloride: 94 mmol/L — ABNORMAL LOW (ref 98–111)
Creatinine, Ser: 1.11 mg/dL — ABNORMAL HIGH (ref 0.44–1.00)
GFR, Estimated: 47 mL/min — ABNORMAL LOW (ref >60)
Glucose, Bld: 155 mg/dL — ABNORMAL HIGH (ref 70–99)
Potassium: 4.4 mmol/L (ref 3.5–5.1)
Sodium: 126 mmol/L — ABNORMAL LOW (ref 135–145)

## 2020-08-25 MED ORDER — LIDOCAINE 5 % EX PTCH: 1.0000 | MEDICATED_PATCH | Freq: Two times a day (BID) | CUTANEOUS | 0 refills | Status: DC

## 2020-08-25 MED ORDER — SODIUM CHLORIDE 0.9 % IV BOLUS
500.0000 mL | Freq: Once | INTRAVENOUS | Status: AC
  Administered 2020-08-25: 500 mL via INTRAVENOUS

## 2020-08-25 MED ORDER — ONDANSETRON HCL 4 MG/2ML IJ SOLN
4.0000 mg | Freq: Once | INTRAMUSCULAR | Status: AC
  Administered 2020-08-25: 4 mg via INTRAVENOUS
  Filled 2020-08-25: qty 2

## 2020-08-25 MED ORDER — SODIUM CHLORIDE 0.9 % IV BOLUS: 500.0000 mL | Freq: Once | INTRAVENOUS | Status: DC

## 2020-08-25 MED ORDER — HYDROCODONE-ACETAMINOPHEN 5-325 MG PO TABS: 1.0000 | ORAL_TABLET | Freq: Four times a day (QID) | ORAL | 0 refills | Status: DC | PRN

## 2020-08-25 MED ORDER — MORPHINE SULFATE (PF) 4 MG/ML IV SOLN
4.0000 mg | Freq: Once | INTRAVENOUS | Status: AC
  Administered 2020-08-25: 4 mg via INTRAVENOUS
  Filled 2020-08-25: qty 1

## 2020-08-25 MED ORDER — LIDOCAINE 5 % EX PTCH
1.0000 | MEDICATED_PATCH | CUTANEOUS | Status: DC
  Administered 2020-08-25: 1 via TRANSDERMAL
  Filled 2020-08-25: qty 1

## 2020-08-25 MED ORDER — MORPHINE SULFATE (PF) 4 MG/ML IV SOLN
4.0000 mg | Freq: Once | INTRAVENOUS | Status: AC
Start: 2020-08-25 — End: 2020-08-25
  Administered 2020-08-25: 4 mg via INTRAVENOUS
  Filled 2020-08-25: qty 1

## 2020-08-25 NOTE — ED Notes (Signed)
Per CT tech- pt refuses CT until she gets pain medication. Anderson Malta RN made aware.

## 2020-08-25 NOTE — ED Triage Notes (Signed)
See Firs RN note.

## 2020-08-25 NOTE — ED Provider Notes (Signed)
[facility] Emergency Department Provider Note  ____________________________________________   None    (approximate)  I have reviewed the triage vital signs and the nursing notes.   HISTORY  Chief Complaint Flank Pain    HPI Gina [name] is a 87 y.o. female presents emergency department via EMS from [place] with complaints of lower back pain and abdominal pain.  Patient states last time she had this pain it was a kidney stone.  Now she has had some numbness and tingling into the right leg.  States most of her problems have always been musculoskeletal.  States she keeps getting charley horses.  She denies any chest pain/shortness of breath.  No weakness or dizziness.  Past Medical History:  Diagnosis Date   Breast mass in female    right breast   Cataract    Difficult intubation 03/03/2016   September 06, 2013; left nasal fiberoptic intubation #7 ETT; see letter from Dr. 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 Mike Gip   Hemorrhoids 07/29/2016   Hyperlipemia    Hyperlipidemia    history of    Hypothyroidism    Mild atherosclerosis of carotid artery 06/25/2015   Morton's neuroma    MRI contraindicated due to metal implant    Prolapse of uterus    Radiculopathy    Renal cyst    Rosacea    Shingles    Thrombocytosis     Patient Active Problem List   Diagnosis Date Noted   Paresthesia of upper and lower extremities of both sides 03/20/2020   Requires assistance with activities of daily living (ADL) 03/20/2020   Mixed hyperlipidemia 03/19/2020   Anxiety with depression 03/19/2020   Degenerative disc disease, cervical 05/10/2019   De Quervain's tenosynovitis, left 05/10/2019   Bilateral hand numbness 05/10/2019   DNR (do not resuscitate) 04/22/2018    Venous stasis 04/21/2018   Cystocele with prolapse 04/01/2017   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   Essential thrombocytosis ([place]) 07/23/2015   Mild atherosclerosis of carotid artery 06/25/2015   Dyslipidemia 06/03/2015   Diverticulosis 05/28/2015   GERD without esophagitis 02/06/2015   Neuropathy 01/31/2015   Cervical radiculopathy 01/31/2015   Stage 3b chronic kidney disease ([place]) 10/06/2014   Renal cyst 10/06/2014   Uterine prolapse 10/06/2014   Cervical radiculopathy due to degenerative joint disease of spine 10/06/2014   History of right hip replacement 10/06/2014   Hypothyroidism 10/05/2014   Essential hypertension 09/13/2014    Past Surgical History:  Procedure Laterality Date   ADENOIDECTOMY     APPENDECTOMY     biopsy, right breast      CATARACT EXTRACTION W/PHACO Left 03/25/2015   Procedure: CATARACT EXTRACTION PHACO AND INTRAOCULAR LENS PLACEMENT ([place]);  Surgeon: Estill Cotta, MD;  Location: [facility] ORS;  Service: Ophthalmology;  Laterality: Left;  [place] 02:09 AP% 27.3 CDE 59.97 fluid pack lot #[id number]   CESAREAN SECTION     COLON SURGERY     JOINT REPLACEMENT Right    total hip   THYROID LOBECTOMY     THYROIDECTOMY     partial   TONSILLECTOMY     TOTAL HIP ARTHROPLASTY     UPPER GI ENDOSCOPY  10/17/2008    Prior to Admission medications   Medication Sig Start Date End Date Taking? Authorizing Provider  HYDROcodone-acetaminophen (NORCO/VICODIN)  5-325 MG tablet Take 1 tablet by mouth every 6 (six) hours as needed for moderate pain. 08/25/20  Yes Demaria Deeney, Linden Dolin, PA-C  lidocaine (LIDODERM) 5 % Place 1 patch onto the skin every 12 (twelve) hours. Remove & Discard patch within 12 hours or as directed by MD 08/25/20 08/25/21 Yes Vickey Boak, Linden Dolin, PA-C  Acetaminophen 500 MG coapsule Take by mouth every 4 (four) hours as needed for fever.    [provider]  amLODipine (NORVASC) 5 MG tablet Take 1 tablet (5 mg total) by mouth daily. 08/12/20   Delsa Grana, PA-C  aspirin  EC 81 MG tablet Take 81 mg by mouth daily.  08/19/15   Arnetha Courser, MD  atorvastatin (LIPITOR) 20 MG tablet Take 1 tablet (20 mg total) by mouth at bedtime. 08/12/20   Delsa Grana, PA-C  Cholecalciferol (VITAMIN D3) 1000 units CAPS Take by mouth daily.     [provider]  famotidine (PEPCID) 20 MG tablet Take one tab once to twice daily as needed 08/12/20   Delsa Grana, PA-C  hydroxyurea (HYDREA) 500 MG capsule TAKE 1 CAPSULE BY MOUTH 4 DAYS EVERY WEEK 05/12/20   Lloyd Huger, MD  irbesartan (AVAPRO) 150 MG tablet Take 1 tablet (150 mg total) by mouth daily. 08/12/20   Delsa Grana, PA-C  ketoconazole (NIZORAL) 2 % shampoo Apply 1 application topically 2 (two) times a week. Wash scalp 2 times weekly, let sit 5 minutes and rinse out 04/22/20   Brendolyn Patty, MD  levothyroxine (SYNTHROID) 88 MCG tablet TAKE 1 TABLET BY MOUTH ONCE DAILY BEFORE BREAKFAST 08/13/20   Delsa Grana, PA-C  Polyethyl Glycol-Propyl Glycol (SYSTANE) 0.4-0.3 % SOLN Apply to eye.    [provider]    Allergies Novocain [procaine]  Family History  Problem Relation Age of Onset   Stroke Mother    Stroke Father    Dementia Maternal Grandfather     Social History Social History   Tobacco Use   Smoking status: Former    Pack years: 0.00   Smokeless tobacco: Never  Vaping Use   Vaping Use: Never used  Substance Use Topics   Alcohol use: No    Alcohol/week: 0.0 standard drinks   Drug use: No    Review of Systems  Constitutional: No fever/chills Eyes: No visual changes. ENT: No sore throat. Respiratory: Denies cough Cardiovascular: Denies chest pain Gastrointestinal: Positive abdominal pain Genitourinary: Negative for dysuria. Musculoskeletal: Positive for back pain. Skin: Negative for rash. Psychiatric: no mood changes,     ____________________________________________   PHYSICAL EXAM:  VITAL SIGNS: ED Triage Vitals [08/25/20 1006]  Enc Vitals Group     BP      Pulse       Resp      Temp      Temp src      SpO2      Weight 152 lb 5.4 oz (69.1 kg)     Height 5\' 3"  (1.6 m)     Head Circumference      Peak Flow      Pain Score 9     Pain Loc      Pain Edu?      Excl. in [place]?     Constitutional: Alert and oriented. Well appearing and in no acute distress. Eyes: Conjunctivae are normal.  Head: Atraumatic. Nose: No congestion/rhinnorhea. Mouth/Throat: Mucous membranes are moist.   Neck:  supple no lymphadenopathy noted Cardiovascular: Normal rate, regular rhythm. Heart sounds are normal Respiratory:  Normal respiratory effort.  No retractions, lungs c t a  Abd: soft nontender bs normal all 4 quad, no pulsatile masses noted GU: deferred Musculoskeletal: FROM all extremities, warm and well perfused, right hip is tender to palpation, lower spine is tender to palpation, 2+ pulses distally bilaterally, neurovascular is intact Neurologic:  Normal speech and language.  Skin:  Skin is warm, dry and intact. No rash noted. Psychiatric: Mood and affect are normal. Speech and behavior are normal.  ____________________________________________   LABS (all labs ordered are listed, but only abnormal results are displayed)  Labs Reviewed  BASIC METABOLIC PANEL - Abnormal; Notable for the following components:      Result Value   Sodium 126 (*)    Chloride 94 (*)    Glucose, Bld 155 (*)    BUN 26 (*)    Creatinine, Ser 1.11 (*)    GFR, Estimated 47 (*)    All other components within normal limits  CBC - Abnormal; Notable for the following components:   Platelets 459 (*)    All other components within normal limits  URINALYSIS, COMPLETE (UACMP) WITH MICROSCOPIC   ____________________________________________   ____________________________________________  RADIOLOGY  CT renal stone, no charge lumbar spine  ____________________________________________   PROCEDURES  Procedure(s) performed:  No  Procedures    ____________________________________________   INITIAL IMPRESSION / ASSESSMENT AND PLAN / ED COURSE  Pertinent labs & imaging results that were available during my care of the patient were reviewed by me and considered in my medical decision making (see chart for details).   Patient is a 87 year old female presents with low back pain and lower abdominal pain.  Concerns for kidney stone.  Patient.  Also has numbness and tingling along the right leg.  See HPI physical exam shows patient appears stable at this time  DDx: Lumbar radiculopathy, kidney stone, AAA, electrolyte imbalance  CBC is normal, basic metabolic panel has low potassium, increased glucose 155, BUN and creatinine are elevated but are normal for patient's trend  CT renal stone study ordered, no acute findings in the lumbar spine, does have some stenosis, chronic; enlarged renal cyst This was reviewed by me and confirmed by radiology  Patient states she is feeling better after pain medication and fluids.  States the leg cramps have subsided somewhat.  We will give her additional bag of fluid.  She was given more pain medication.  Given a Lidoderm patch.  She is to follow-up with her regular doctor, Dr. Phyllis Ginger or Dr. Lacinda Axon.  Return emergency department worsening.  Rx for lidoderm patches and hydrocodone, discussed constipation, sedation, and falls assoc with narcotic pain medication.  She was discharged in stable condition   Gina [name] was evaluated in Emergency Department on 08/25/2020 for the symptoms described in the history of present illness. She was evaluated in the context of the global COVID-19 pandemic, which necessitated consideration that the patient might be at risk for infection with the SARS-CoV-2 virus that causes COVID-19. Institutional protocols and algorithms that pertain to the evaluation of patients at risk for COVID-19 are in a state of rapid change based on information released by  regulatory bodies including the CDC and federal and state organizations. These policies and algorithms were followed during the patient's care in the ED.    As part of my medical decision making, I reviewed the following data within the [place] notes reviewed and incorporated, Labs reviewed , Old chart reviewed, Radiograph reviewed , Notes from prior ED  visits, and [place] Controlled Substance Database  ____________________________________________   FINAL CLINICAL IMPRESSION(S) / ED DIAGNOSES  Final diagnoses:  Back pain  Lumbar radiculopathy  Renal cyst  Hyponatremia      NEW MEDICATIONS STARTED DURING THIS VISIT:  New Prescriptions   HYDROCODONE-ACETAMINOPHEN (NORCO/VICODIN) 5-325 MG TABLET    Take 1 tablet by mouth every 6 (six) hours as needed for moderate pain.   LIDOCAINE (LIDODERM) 5 %    Place 1 patch onto the skin every 12 (twelve) hours. Remove & Discard patch within 12 hours or as directed by MD     Note:  This document was prepared using Dragon voice recognition software and may include unintentional dictation errors.    Versie Starks, PA-C 08/25/20 1656    Carrie Mew, MD 08/26/20 [phone]

## 2020-08-25 NOTE — Discharge Instructions (Addendum)
Follow-up with your regular kidney specialist to inform them of the kidney cyst as enlarged Follow-up with either Dr. Consuela Mimes or Dr. Lacinda Axon to evaluate you for injections for the lumbar radiculopathy. Use your Lidoderm patches as prescribed Hydrocodone for pain not controlled by over-the-counter medications or Lidoderm patches.  Please be aware this will make you very drowsy.  You have an increased risk of falls by taking this medication.

## 2020-08-25 NOTE — ED Triage Notes (Signed)
Pt in via EMS from [place] with c/o back pain and abd pain. Pt reports she thinks she has a kidney stone. Pt reports started last Thursday, she saw her MD and they said UTI,  gave meds which she has been taking with no relief. Pt reports hx of stones that felt the same and that is why she thinks it is a stone

## 2020-08-27 ENCOUNTER — Other Ambulatory Visit: Payer: Self-pay

## 2020-08-27 ENCOUNTER — Ambulatory Visit (INDEPENDENT_AMBULATORY_CARE_PROVIDER_SITE_OTHER): Payer: Medicare HMO | Admitting: Unknown Physician Specialty

## 2020-08-27 ENCOUNTER — Encounter: Payer: Self-pay | Admitting: Unknown Physician Specialty

## 2020-08-27 VITALS — BP 130/82 | HR 72 | Temp 97.9°F | Resp 16 | Ht 63.0 in | Wt 149.9 lb

## 2020-08-27 DIAGNOSIS — R7309 Other abnormal glucose: Secondary | ICD-10-CM

## 2020-08-27 DIAGNOSIS — R82998 Other abnormal findings in urine: Secondary | ICD-10-CM | POA: Diagnosis not present

## 2020-08-27 DIAGNOSIS — M5416 Radiculopathy, lumbar region: Secondary | ICD-10-CM | POA: Diagnosis not present

## 2020-08-27 DIAGNOSIS — N281 Cyst of kidney, acquired: Secondary | ICD-10-CM | POA: Diagnosis not present

## 2020-08-27 DIAGNOSIS — E871 Hypo-osmolality and hyponatremia: Secondary | ICD-10-CM

## 2020-08-27 LAB — POCT URINALYSIS DIPSTICK
Bilirubin, UA: NEGATIVE
Blood, UA: NEGATIVE
Glucose, UA: NEGATIVE
Ketones, UA: NEGATIVE
Nitrite, UA: NEGATIVE
Odor: NORMAL
Protein, UA: NEGATIVE
Spec Grav, UA: 1.01 (ref 1.010–1.025)
Urobilinogen, UA: 0.2 E.U./dL
pH, UA: 5 (ref 5.0–8.0)

## 2020-08-27 LAB — POCT GLYCOSYLATED HEMOGLOBIN (HGB A1C): Hemoglobin A1C: 5.9 % — AB (ref 4.0–5.6)

## 2020-08-27 LAB — GLUCOSE, POCT (MANUAL RESULT ENTRY): POC Glucose: 121 mg/dl — AB (ref 70–99)

## 2020-08-27 MED ORDER — NYSTATIN 100000 UNIT/GM EX OINT: 1.0000 "application " | TOPICAL_OINTMENT | Freq: Two times a day (BID) | CUTANEOUS | 0 refills | Status: DC

## 2020-08-27 NOTE — Assessment & Plan Note (Signed)
On CT scan,  Seems to be larger.  Pain profile is consistent with lumbar radiculopathy.  Will continue f/u with Dr. Sharlet Salina made in the ER.  Planning on following up with Dr. Candiss Norse

## 2020-08-27 NOTE — Progress Notes (Signed)
BP 130/82   Pulse 72   Temp 97.9 F (36.6 C)   Resp 16   Ht 5\' 3"  (1.6 m)   Wt 149 lb 14.4 oz (68 kg)   SpO2 97%   BMI 26.55 kg/m    Subjective:    Patient ID: Gina [name], female    DOB: [birth year], 87 y.o.   MRN: [id number]  HPI: Gina [name] is a 87 y.o. female  Chief Complaint  Patient presents with   Back Pain    Folllow-up from urgent care and ER also for renal cyst   Pt is here with low back pain.  She has been to  [facility] on 7/1 and to [facility] on 7/3.  Was told at [facility] that she had a renal cyst which is where they think the pain is coming from.  The pain radiates from right SI to right groin with numbness in left leg down to knee.    Notes reviewed.  Noted hyponatremia at [facility] and dx with UTI at [facility].  She never took the antibiotic.    Wants to know if she has DM.    Relevant past medical, surgical, family and social history reviewed and updated as indicated. Interim medical history since our last visit reviewed. Allergies and medications reviewed and updated.  Review of Systems  Skin:        Fungus right groin   Per HPI unless specifically indicated above     Objective:    BP 130/82   Pulse 72   Temp 97.9 F (36.6 C)   Resp 16   Ht 5\' 3"  (1.6 m)   Wt 149 lb 14.4 oz (68 kg)   SpO2 97%   BMI 26.55 kg/m   Wt Readings from Last 3 Encounters:  08/27/20 149 lb 14.4 oz (68 kg)  08/25/20 152 lb 5.4 oz (69.1 kg)  08/12/20 152 lb 6.4 oz (69.1 kg)    Physical Exam Constitutional:      General: She is not in acute distress.    Appearance: Normal appearance. She is well-developed.  HENT:     Head: Normocephalic and atraumatic.  Eyes:     General: Lids are normal. No scleral icterus.       Right eye: No discharge.        Left eye: No discharge.     Conjunctiva/sclera: Conjunctivae normal.  Neck:     Vascular: No carotid bruit or JVD.  Cardiovascular:     Rate and Rhythm: Normal rate and regular rhythm.     Heart sounds: Normal heart sounds.  Pulmonary:      Effort: Pulmonary effort is normal. No respiratory distress.     Breath sounds: Normal breath sounds.  Abdominal:     Palpations: There is no hepatomegaly or splenomegaly.  Musculoskeletal:        General: Normal range of motion.     Cervical back: Normal range of motion and neck supple.  Skin:    General: Skin is warm and dry.     Coloration: Skin is not pale.     Findings: No rash.  Neurological:     Mental Status: She is alert and oriented to person, place, and time.  Psychiatric:        Behavior: Behavior normal.        Thought Content: Thought content normal.        Judgment: Judgment normal.    Results for orders placed or performed in visit on 08/27/20  POCT  urinalysis dipstick  Result Value Ref Range   Color, UA yellow    Clarity, UA clear    Glucose, UA Negative Negative   Bilirubin, UA neg    Ketones, UA neg    Spec Grav, UA 1.010 1.010 - 1.025   Blood, UA neg    pH, UA 5.0 5.0 - 8.0   Protein, UA Negative Negative   Urobilinogen, UA 0.2 0.2 or 1.0 E.U./dL   Nitrite, UA neg    Leukocytes, UA Moderate (2+) (A) Negative   Appearance clear    Odor normal   POCT Glucose (CBG)  Result Value Ref Range   POC Glucose 121 (A) 70 - 99 mg/dl  POCT HgB A1C  Result Value Ref Range   Hemoglobin A1C 5.9 (A) 4.0 - 5.6 %   HbA1c POC (<> result, manual entry)     HbA1c, POC (prediabetic range)     HbA1c, POC (controlled diabetic range)        Assessment & Plan:   Problem List Items Addressed This Visit       Unprioritized   Renal cyst    On CT scan,  Seems to be larger.  Pain profile is consistent with lumbar radiculopathy.  Will continue f/u with Dr. Sharlet Salina made in the ER.  Planning on following up with Dr. Candiss Norse       Other Visit Diagnoses     Lumbar radiculopathy    -  Primary   Pain consistent with this.  Referral made to Dr. Sharlet Salina   Relevant Orders   POCT urinalysis dipstick (Completed)   Hyponatremia       Sees Dr. Candiss Norse.  Will decrease free water  intake   Elevated glucose       Relevant Orders   POCT Glucose (CBG) (Completed)   POCT HgB A1C (Completed)   Leukocytes in urine       Will check urine culture before treating as asymptomatic   Relevant Orders   Urine Culture       Order for PT which we will send to [facility] where she lives.    Hgb A1C is 5.9.  Reassurance  Follow up plan: Return if symptoms worsen or fail to improve.

## 2020-08-28 LAB — URINE CULTURE
MICRO NUMBER:: 12082832
Result:: NO GROWTH
SPECIMEN QUALITY:: ADEQUATE

## 2020-08-29 ENCOUNTER — Telehealth: Payer: Self-pay | Admitting: Family Medicine

## 2020-08-29 ENCOUNTER — Telehealth: Payer: Self-pay

## 2020-08-29 DIAGNOSIS — N1832 Chronic kidney disease, stage 3b: Secondary | ICD-10-CM | POA: Diagnosis not present

## 2020-08-29 DIAGNOSIS — I129 Hypertensive chronic kidney disease with stage 1 through stage 4 chronic kidney disease, or unspecified chronic kidney disease: Secondary | ICD-10-CM | POA: Diagnosis not present

## 2020-08-29 DIAGNOSIS — I872 Venous insufficiency (chronic) (peripheral): Secondary | ICD-10-CM | POA: Diagnosis not present

## 2020-08-29 DIAGNOSIS — M5412 Radiculopathy, cervical region: Secondary | ICD-10-CM | POA: Diagnosis not present

## 2020-08-29 DIAGNOSIS — M4722 Other spondylosis with radiculopathy, cervical region: Secondary | ICD-10-CM | POA: Diagnosis not present

## 2020-08-29 DIAGNOSIS — R69 Illness, unspecified: Secondary | ICD-10-CM | POA: Diagnosis not present

## 2020-08-29 NOTE — Telephone Encounter (Signed)
Pt.notified

## 2020-08-29 NOTE — Telephone Encounter (Signed)
Pt calling to receive her UA results. Please advise Cb- [phone]

## 2020-08-29 NOTE — Telephone Encounter (Signed)
Copied from [place] [phone] [phone]. Topic: Quick Communication - Home Health Verbal Orders >> Aug 29, 2020 11:28 AM Loma Boston wrote: Caller/Agency: Rondell Reams Number: [phone] Requesting PT/   2 times week /2 wk      1 time week/4wk

## 2020-08-30 ENCOUNTER — Encounter: Payer: Self-pay | Admitting: Family Medicine

## 2020-09-03 DIAGNOSIS — I129 Hypertensive chronic kidney disease with stage 1 through stage 4 chronic kidney disease, or unspecified chronic kidney disease: Secondary | ICD-10-CM | POA: Diagnosis not present

## 2020-09-03 DIAGNOSIS — I872 Venous insufficiency (chronic) (peripheral): Secondary | ICD-10-CM | POA: Diagnosis not present

## 2020-09-03 DIAGNOSIS — N1832 Chronic kidney disease, stage 3b: Secondary | ICD-10-CM | POA: Diagnosis not present

## 2020-09-03 DIAGNOSIS — R69 Illness, unspecified: Secondary | ICD-10-CM | POA: Diagnosis not present

## 2020-09-03 DIAGNOSIS — M5412 Radiculopathy, cervical region: Secondary | ICD-10-CM | POA: Diagnosis not present

## 2020-09-05 DIAGNOSIS — I872 Venous insufficiency (chronic) (peripheral): Secondary | ICD-10-CM | POA: Diagnosis not present

## 2020-09-05 DIAGNOSIS — M5412 Radiculopathy, cervical region: Secondary | ICD-10-CM | POA: Diagnosis not present

## 2020-09-05 DIAGNOSIS — I129 Hypertensive chronic kidney disease with stage 1 through stage 4 chronic kidney disease, or unspecified chronic kidney disease: Secondary | ICD-10-CM | POA: Diagnosis not present

## 2020-09-05 DIAGNOSIS — N1832 Chronic kidney disease, stage 3b: Secondary | ICD-10-CM | POA: Diagnosis not present

## 2020-09-05 DIAGNOSIS — R69 Illness, unspecified: Secondary | ICD-10-CM | POA: Diagnosis not present

## 2020-09-10 DIAGNOSIS — I129 Hypertensive chronic kidney disease with stage 1 through stage 4 chronic kidney disease, or unspecified chronic kidney disease: Secondary | ICD-10-CM | POA: Diagnosis not present

## 2020-09-10 DIAGNOSIS — N1832 Chronic kidney disease, stage 3b: Secondary | ICD-10-CM | POA: Diagnosis not present

## 2020-09-10 DIAGNOSIS — M48061 Spinal stenosis, lumbar region without neurogenic claudication: Secondary | ICD-10-CM | POA: Diagnosis not present

## 2020-09-10 DIAGNOSIS — M5416 Radiculopathy, lumbar region: Secondary | ICD-10-CM | POA: Diagnosis not present

## 2020-09-10 DIAGNOSIS — R69 Illness, unspecified: Secondary | ICD-10-CM | POA: Diagnosis not present

## 2020-09-10 DIAGNOSIS — I872 Venous insufficiency (chronic) (peripheral): Secondary | ICD-10-CM | POA: Diagnosis not present

## 2020-09-10 DIAGNOSIS — M5136 Other intervertebral disc degeneration, lumbar region: Secondary | ICD-10-CM | POA: Diagnosis not present

## 2020-09-10 DIAGNOSIS — M5412 Radiculopathy, cervical region: Secondary | ICD-10-CM | POA: Diagnosis not present

## 2020-09-12 DIAGNOSIS — N1832 Chronic kidney disease, stage 3b: Secondary | ICD-10-CM | POA: Diagnosis not present

## 2020-09-12 DIAGNOSIS — I129 Hypertensive chronic kidney disease with stage 1 through stage 4 chronic kidney disease, or unspecified chronic kidney disease: Secondary | ICD-10-CM | POA: Diagnosis not present

## 2020-09-12 DIAGNOSIS — I872 Venous insufficiency (chronic) (peripheral): Secondary | ICD-10-CM | POA: Diagnosis not present

## 2020-09-12 DIAGNOSIS — R69 Illness, unspecified: Secondary | ICD-10-CM | POA: Diagnosis not present

## 2020-09-12 DIAGNOSIS — M5412 Radiculopathy, cervical region: Secondary | ICD-10-CM | POA: Diagnosis not present

## 2020-09-17 DIAGNOSIS — I872 Venous insufficiency (chronic) (peripheral): Secondary | ICD-10-CM | POA: Diagnosis not present

## 2020-09-17 DIAGNOSIS — N1832 Chronic kidney disease, stage 3b: Secondary | ICD-10-CM | POA: Diagnosis not present

## 2020-09-17 DIAGNOSIS — R69 Illness, unspecified: Secondary | ICD-10-CM | POA: Diagnosis not present

## 2020-09-17 DIAGNOSIS — I129 Hypertensive chronic kidney disease with stage 1 through stage 4 chronic kidney disease, or unspecified chronic kidney disease: Secondary | ICD-10-CM | POA: Diagnosis not present

## 2020-09-17 DIAGNOSIS — M5412 Radiculopathy, cervical region: Secondary | ICD-10-CM | POA: Diagnosis not present

## 2020-09-18 ENCOUNTER — Encounter: Payer: Self-pay | Admitting: Obstetrics & Gynecology

## 2020-09-18 ENCOUNTER — Other Ambulatory Visit: Payer: Self-pay

## 2020-09-18 ENCOUNTER — Ambulatory Visit: Payer: Medicare HMO | Admitting: Obstetrics & Gynecology

## 2020-09-18 VITALS — BP 128/80 | Ht 63.0 in | Wt 151.0 lb

## 2020-09-18 DIAGNOSIS — N814 Uterovaginal prolapse, unspecified: Secondary | ICD-10-CM | POA: Diagnosis not present

## 2020-09-18 NOTE — Progress Notes (Signed)
HPI:      [name] is a 87 y.o. [phone] who presents today for her pessary follow up and examination related to her pelvic floor weakening.  Pt reports tolerating the pessary well with no vaginal bleeding and no vaginal discharge.  Symptoms of pelvic floor weakening have greatly improved. She is voiding and defecating without difficulty. She currently has a RING #3 pessary.  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 Also,  has a past surgical history that includes Tonsillectomy; Appendectomy; Cesarean section; Joint replacement (Right); Thyroidectomy; Colon surgery; Cataract extraction w/PHACO (Left, 03/25/2015); Adenoidectomy; biopsy, right breast ; Total hip arthroplasty; Thyroid lobectomy; and Upper gi endoscopy (10/17/2008)., family history includes Dementia in her maternal grandfather; Stroke in her father and mother.,  reports that she has quit smoking. She has never used smokeless tobacco. She reports that she does not drink alcohol and does not use drugs.  She has a current medication list which includes the following prescription(s): acetaminophen, amlodipine, aspirin ec, atorvastatin, vitamin d3, famotidine, hydrocodone-acetaminophen, hydroxyurea, irbesartan, ketoconazole, levothyroxine, lidocaine, nystatin ointment, and systane. Also, is allergic to novocain [procaine].  Review of Systems  All other systems reviewed and are negative.  Objective: BP 128/80   Ht '5\' 3"'$  (1.6 m)   Wt 151 lb  (68.5 kg)   BMI 26.75 kg/m  Physical Exam Constitutional:      General: She is not in acute distress.    Appearance: She is well-developed.  Genitourinary:     Right Labia: No rash or tenderness.    Left Labia: No tenderness or rash.    No vaginal erythema or bleeding.      Right Adnexa: not tender and no mass present.    Left Adnexa: not tender and no mass present.    No cervical motion tenderness, discharge, polyp or nabothian cyst.     Uterus is not enlarged.     No uterine mass detected.    Uterus exam comments: Prolapse noted.     Pelvic exam was performed with patient in the lithotomy position.  HENT:     Head: Normocephalic and atraumatic.     Nose: Nose normal.  Abdominal:     General: There is no distension.     Palpations: Abdomen is soft.     Tenderness: There is no abdominal tenderness.  Musculoskeletal:        General: Normal range of motion.  Neurological:     Mental Status: She is alert and oriented to person, place, and time.     Cranial Nerves: No cranial nerve deficit.  Skin:    General: Skin is warm and dry.  Psychiatric:        Attention and Perception: Attention normal.        Mood and Affect: Mood and affect normal.        Speech: Speech normal.        Behavior: Behavior normal.        Thought Content: Thought content normal.        Judgment: Judgment normal.    Pessary Care Pessary removed and cleaned.  Vagina checked - without erosions - pessary replaced.  A/P:   ICD-10-CM  1. Uterine prolapse  N81.4     2. Cystocele with prolapse  N81.4      Pessary was cleaned and replaced today. Instructions given for care. Concerning symptoms to observe for are counseled to patient. Follow up scheduled for 3 months.  A total of 20 minutes were spent face-to-face with the patient as well as preparation, review, communication, and documentation during this encounter.   Barnett Applebaum, MD, [facility], [facility] 09/18/2020   11:58 AM

## 2020-09-23 DIAGNOSIS — N1832 Chronic kidney disease, stage 3b: Secondary | ICD-10-CM | POA: Diagnosis not present

## 2020-09-23 DIAGNOSIS — R69 Illness, unspecified: Secondary | ICD-10-CM | POA: Diagnosis not present

## 2020-09-23 DIAGNOSIS — I872 Venous insufficiency (chronic) (peripheral): Secondary | ICD-10-CM | POA: Diagnosis not present

## 2020-09-23 DIAGNOSIS — I129 Hypertensive chronic kidney disease with stage 1 through stage 4 chronic kidney disease, or unspecified chronic kidney disease: Secondary | ICD-10-CM | POA: Diagnosis not present

## 2020-09-23 DIAGNOSIS — M5412 Radiculopathy, cervical region: Secondary | ICD-10-CM | POA: Diagnosis not present

## 2020-10-01 DIAGNOSIS — N1832 Chronic kidney disease, stage 3b: Secondary | ICD-10-CM | POA: Diagnosis not present

## 2020-10-01 DIAGNOSIS — R69 Illness, unspecified: Secondary | ICD-10-CM | POA: Diagnosis not present

## 2020-10-01 DIAGNOSIS — M5412 Radiculopathy, cervical region: Secondary | ICD-10-CM | POA: Diagnosis not present

## 2020-10-01 DIAGNOSIS — I872 Venous insufficiency (chronic) (peripheral): Secondary | ICD-10-CM | POA: Diagnosis not present

## 2020-10-01 DIAGNOSIS — I129 Hypertensive chronic kidney disease with stage 1 through stage 4 chronic kidney disease, or unspecified chronic kidney disease: Secondary | ICD-10-CM | POA: Diagnosis not present

## 2020-10-08 DIAGNOSIS — R69 Illness, unspecified: Secondary | ICD-10-CM | POA: Diagnosis not present

## 2020-10-08 DIAGNOSIS — N1832 Chronic kidney disease, stage 3b: Secondary | ICD-10-CM | POA: Diagnosis not present

## 2020-10-08 DIAGNOSIS — I872 Venous insufficiency (chronic) (peripheral): Secondary | ICD-10-CM | POA: Diagnosis not present

## 2020-10-08 DIAGNOSIS — I129 Hypertensive chronic kidney disease with stage 1 through stage 4 chronic kidney disease, or unspecified chronic kidney disease: Secondary | ICD-10-CM | POA: Diagnosis not present

## 2020-10-08 DIAGNOSIS — M5412 Radiculopathy, cervical region: Secondary | ICD-10-CM | POA: Diagnosis not present

## 2020-10-14 ENCOUNTER — Inpatient Hospital Stay: Payer: Medicare HMO | Attending: Oncology | Admitting: Oncology

## 2020-10-14 ENCOUNTER — Encounter: Payer: Self-pay | Admitting: Oncology

## 2020-10-14 ENCOUNTER — Inpatient Hospital Stay: Payer: Medicare HMO

## 2020-10-14 VITALS — BP 132/63 | HR 78 | Temp 97.6°F | Resp 18 | Wt 152.2 lb

## 2020-10-14 DIAGNOSIS — D473 Essential (hemorrhagic) thrombocythemia: Secondary | ICD-10-CM | POA: Insufficient documentation

## 2020-10-14 DIAGNOSIS — G620 Drug-induced polyneuropathy: Secondary | ICD-10-CM | POA: Diagnosis not present

## 2020-10-14 DIAGNOSIS — N289 Disorder of kidney and ureter, unspecified: Secondary | ICD-10-CM | POA: Insufficient documentation

## 2020-10-14 DIAGNOSIS — M2550 Pain in unspecified joint: Secondary | ICD-10-CM | POA: Insufficient documentation

## 2020-10-14 DIAGNOSIS — G629 Polyneuropathy, unspecified: Secondary | ICD-10-CM | POA: Insufficient documentation

## 2020-10-14 LAB — CBC WITH DIFFERENTIAL/PLATELET
Abs Immature Granulocytes: 0.05 10*3/uL (ref 0.00–0.07)
Basophils Absolute: 0 10*3/uL (ref 0.0–0.1)
Basophils Relative: 1 %
Eosinophils Absolute: 0.1 10*3/uL (ref 0.0–0.5)
Eosinophils Relative: 1 %
HCT: 40.4 % (ref 36.0–46.0)
Hemoglobin: 13.5 g/dL (ref 12.0–15.0)
Immature Granulocytes: 1 %
Lymphocytes Relative: 17 %
Lymphs Abs: 1.4 10*3/uL (ref 0.7–4.0)
MCH: 31.8 pg (ref 26.0–34.0)
MCHC: 33.4 g/dL (ref 30.0–36.0)
MCV: 95.1 fL (ref 80.0–100.0)
Monocytes Absolute: 0.9 10*3/uL (ref 0.1–1.0)
Monocytes Relative: 11 %
Neutro Abs: 5.7 10*3/uL (ref 1.7–7.7)
Neutrophils Relative %: 69 %
Platelets: 439 10*3/uL — ABNORMAL HIGH (ref 150–400)
RBC: 4.25 MIL/uL (ref 3.87–5.11)
RDW: 14 % (ref 11.5–15.5)
WBC: 8.2 10*3/uL (ref 4.0–10.5)
nRBC: 0 % (ref 0.0–0.2)

## 2020-10-14 LAB — COMPREHENSIVE METABOLIC PANEL
ALT: 16 U/L (ref 0–44)
AST: 19 U/L (ref 15–41)
Albumin: 4.3 g/dL (ref 3.5–5.0)
Alkaline Phosphatase: 85 U/L (ref 38–126)
Anion gap: 8 (ref 5–15)
BUN: 27 mg/dL — ABNORMAL HIGH (ref 8–23)
CO2: 27 mmol/L (ref 22–32)
Calcium: 9.2 mg/dL (ref 8.9–10.3)
Chloride: 99 mmol/L (ref 98–111)
Creatinine, Ser: 1.15 mg/dL — ABNORMAL HIGH (ref 0.44–1.00)
GFR, Estimated: 45 mL/min — ABNORMAL LOW (ref >60)
Glucose, Bld: 115 mg/dL — ABNORMAL HIGH (ref 70–99)
Potassium: 4.5 mmol/L (ref 3.5–5.1)
Sodium: 134 mmol/L — ABNORMAL LOW (ref 135–145)
Total Bilirubin: 1.4 mg/dL — ABNORMAL HIGH (ref 0.3–1.2)
Total Protein: 7.7 g/dL (ref 6.5–8.1)

## 2020-10-14 NOTE — Progress Notes (Signed)
[place]  [phone] [phone]  ID: [name] OB: [id number]  MR#: [id number]  [id number]  Patient Care Team: Delsa Grana, PA-C as PCP - General (Family Medicine) Murlean Iba, MD (Internal Medicine) Gae Dry, MD as Referring Physician (Obstetrics and Gynecology) Estill Cotta, MD (Ophthalmology) Anabel Bene, MD as Referring Physician (Neurology) Beverly Gust, MD (Otolaryngology) Brendolyn Patty, MD (Dermatology) Lloyd Huger, MD as Consulting Physician (Oncology)  CHIEF COMPLAINT: Essential thrombocytosis  INTERVAL HISTORY: Patient returns to clinic for routine 11-monthevaluation for essential thrombocytosis.  She was last seen on 04/15/2020.  She reports doing well.  [place]  Her husband died about a year ago so she keeps herself busy.  Reports some joint pain that she has noticed over the past couple of months.  She has some chronic cervical radiculopathy and is seen by orthopedics.  Reports some occasional neuropathic pain in upper and lower extremities.  REVIEW OF SYSTEMS:   Review of Systems  Constitutional: Negative.  Negative for chills, fever, malaise/fatigue and weight loss.  HENT:  Negative for congestion, ear pain and tinnitus.   Eyes: Negative.  Negative for blurred vision and double vision.  Respiratory: Negative.  Negative for cough, sputum production and shortness of breath.   Cardiovascular: Negative.  Negative for chest pain, palpitations and leg swelling.  Gastrointestinal: Negative.  Negative for abdominal pain, constipation, diarrhea, nausea and vomiting.  Genitourinary:  Negative for dysuria, frequency and urgency.  Musculoskeletal:  Positive for back pain and joint pain. Negative for falls.  Skin: Negative.  Negative for rash.  Neurological: Negative.  Negative for weakness and headaches.  Endo/Heme/Allergies:  Negative.  Does not bruise/bleed easily.  Psychiatric/Behavioral: Negative.  Negative for depression. The patient is not nervous/anxious and does not have insomnia.    As per HPI. Otherwise, a complete review of systems is negative.  PAST MEDICAL HISTORY: Past Medical History:  Diagnosis Date   Breast mass in female    right breast   Cataract    Difficult intubation 03/03/2016   September 06, 2013; left nasal fiberoptic intubation #7 ETT; see letter 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 CMike Gip  Hemorrhoids 07/29/2016   Hyperlipemia    Hyperlipidemia    history of    Hypothyroidism    Mild atherosclerosis of carotid artery 06/25/2015   Morton's neuroma    MRI contraindicated due to metal implant    Prolapse of uterus    Radiculopathy    Renal cyst    Rosacea    Shingles    Thrombocytosis     PAST SURGICAL HISTORY: Past Surgical History:  Procedure Laterality Date   ADENOIDECTOMY     APPENDECTOMY     biopsy, right breast      CATARACT EXTRACTION W/PHACO Left 03/25/2015   Procedure: CATARACT EXTRACTION PHACO AND [place]TRAOCULAR LENS PLACEMENT [place]  Surgeon: SEstill Cotta MD;  Location: [facility] ORS;  Service: Ophthalmology;  Laterality: Left;  [place]:09 AP% 27.3 CDE 59.97 fluid pack lot #[id number]   CESAREAN SECTION     COLON SURGERY     JOINT REPLACEMENT Right    total hip   THYROID LOBECTOMY     THYROIDECTOMY     partial  TONSILLECTOMY     TOTAL HIP ARTHROPLASTY     UPPER GI ENDOSCOPY  10/17/2008    FAMILY HISTORY: Family History  Problem Relation Age of Onset   Stroke Mother    Stroke Father    Dementia Maternal Grandfather     ADVANCED DIRECTIVES (Y/N):  N  HEALTH MAINTENANCE: Social History   Tobacco Use   Smoking status: Former   Smokeless  tobacco: Never  Scientific laboratory technician Use: Never used  Substance Use Topics   Alcohol use: No    Alcohol/week: 0.0 standard drinks   Drug use: No     Colonoscopy:  PAP:  Bone density:  Lipid panel:  Allergies  Allergen Reactions   Novocain [Procaine] Palpitations    Current Outpatient Medications  Medication Sig Dispense Refill   Acetaminophen 500 MG coapsule Take by mouth every 4 (four) hours as needed for fever.     amLODipine (NORVASC) 5 MG tablet Take 1 tablet (5 mg total) by mouth daily. 90 tablet 3   aspirin EC 81 MG tablet Take 81 mg by mouth daily.      atorvastatin (LIPITOR) 20 MG tablet Take 1 tablet (20 mg total) by mouth at bedtime. 90 tablet 3   Cholecalciferol (VITAMIN D3) 1000 units CAPS Take by mouth daily.      famotidine (PEPCID) 20 MG tablet Take one tab once to twice daily as needed 60 tablet 5   HYDROcodone-acetaminophen (NORCO/VICODIN) 5-325 MG tablet Take 1 tablet by mouth every 6 (six) hours as needed for moderate pain. 10 tablet 0   hydroxyurea (HYDREA) 500 MG capsule TAKE 1 CAPSULE BY MOUTH 4 DAYS EVERY WEEK 60 capsule 3   irbesartan (AVAPRO) 150 MG tablet Take 1 tablet (150 mg total) by mouth daily. 90 tablet 3   ketoconazole (NIZORAL) 2 % shampoo Apply 1 application topically 2 (two) times a week. Wash scalp 2 times weekly, let sit 5 minutes and rinse out 120 mL 6   levothyroxine (SYNTHROID) 88 MCG tablet TAKE 1 TABLET BY MOUTH ONCE DAILY BEFORE BREAKFAST 90 tablet 3   lidocaine (LIDODERM) 5 % Place 1 patch onto the skin every 12 (twelve) hours. Remove & Discard patch within 12 hours or as directed by MD 10 patch 0   nystatin ointment (MYCOSTATIN) Apply 1 application topically 2 (two) times daily. 30 g 0   Polyethyl Glycol-Propyl Glycol (SYSTANE) 0.4-0.3 % SOLN Apply to eye.     No current facility-administered medications for this visit.    OBJECTIVE: Vitals:   10/14/20 1101  BP: 132/63  Pulse: 78  Resp: 18  Temp: 97.6 F (36.4 C)     Body  mass index is 26.96 kg/m.    ECOG FS:0 - Asymptomatic  Physical Exam Constitutional:      Appearance: Normal appearance.  HENT:     Head: Normocephalic and atraumatic.  Eyes:     Pupils: Pupils are equal, round, and reactive to light.  Cardiovascular:     Rate and Rhythm: Normal rate and regular rhythm.     Heart sounds: Normal heart sounds. No murmur heard. Pulmonary:     Effort: Pulmonary effort is normal.     Breath sounds: Normal breath sounds. No wheezing.  Abdominal:     General: Bowel sounds are normal. There is no distension.     Palpations: Abdomen is soft.     Tenderness: There is no abdominal tenderness.  Musculoskeletal:        General: Normal range of  motion.     Cervical back: Normal range of motion.  Skin:    General: Skin is warm and dry.     Findings: No rash.  Neurological:     Mental Status: She is alert and oriented to person, place, and time.  Psychiatric:        Judgment: Judgment normal.     LAB RESULTS:  Lab Results  Component Value Date   NA 134 (L) 10/14/2020   K 4.5 10/14/2020   CL 99 10/14/2020   CO2 27 10/14/2020   GLUCOSE 115 (H) 10/14/2020   BUN 27 (H) 10/14/2020   CREATININE 1.15 (H) 10/14/2020   CALCIUM 9.2 10/14/2020   PROT 7.7 10/14/2020   ALBUMIN 4.3 10/14/2020   AST 19 10/14/2020   ALT 16 10/14/2020   ALKPHOS 85 10/14/2020   BILITOT 1.4 (H) 10/14/2020   GFRNONAA 45 (L) 10/14/2020   GFRAA 43 (L) 08/12/2020    Lab Results  Component Value Date   WBC 8.2 10/14/2020   NEUTROABS 5.7 10/14/2020   HGB 13.5 10/14/2020   HCT 40.4 10/14/2020   MCV 95.1 10/14/2020   PLT 439 (H) 10/14/2020     STUDIES: No results found.  ASSESSMENT: Essential thrombocytosis  PLAN:   1. Essential thrombocytosis:  She was initially diagnosed with AT on 06/24/2015 JAK2 positive V6 17 F mutation.  MPL is negative.  She continues to tolerate hydroxyurea 500 mg p.o. 4 days/week.  She is on a baby aspirin 81 mg daily.  Lab work from 10/14/2020  showed platelet count of 439,000, hemoglobin 13.5 with a normal white blood cell count.  Recommend continuing 4/'500mg'$  hydroxyurea weekly.  2.  Renal insufficiency:  Continues to have mild renal dysfunction.  Creatinine 1.15.  BUN 27.  Push fluids.  3.  Joint pain/neuropathy: According to up-to-date, hydroxyurea can cause arthralgias (9%), asthenia (9%), back pain (5%) and limb pain (3%).  She is interested in holistic remedies versus medications.  Patient would like to try acupuncture.  Referral sent to [facility].  Disposition: Referral sent for acupuncture. RTC in 3 months with repeat labs only. RTC in 6 months with repeat labs (CBC, CMP) and MD assessment.  I spent 20 minutes dedicated to the care of this patient (face-to-face and non-face-to-face) on the date of the encounter to include what is described in the assessment and plan.  Patient expressed understanding and was in agreement with this plan. She also understands that She can call clinic at any time with any questions, concerns, or complaints.    Jacquelin Hawking, NP   10/14/2020 1:36 PM

## 2020-10-15 DIAGNOSIS — E871 Hypo-osmolality and hyponatremia: Secondary | ICD-10-CM | POA: Diagnosis not present

## 2020-10-15 DIAGNOSIS — N1831 Chronic kidney disease, stage 3a: Secondary | ICD-10-CM | POA: Diagnosis not present

## 2020-10-15 DIAGNOSIS — N281 Cyst of kidney, acquired: Secondary | ICD-10-CM | POA: Diagnosis not present

## 2020-10-22 ENCOUNTER — Encounter: Payer: Self-pay | Admitting: *Deleted

## 2020-10-22 NOTE — Progress Notes (Signed)
Received message from Rulon Abide, NP that patient was interested acupuncture for her neuropathy. Spoke to patient.  I have sent an approval to [place] for 6 free sessions.  If patient has not heard from their office she is to call them to set up an appointment.

## 2020-12-25 ENCOUNTER — Ambulatory Visit: Payer: Medicare HMO | Admitting: Dermatology

## 2020-12-25 ENCOUNTER — Other Ambulatory Visit: Payer: Self-pay

## 2020-12-25 DIAGNOSIS — L820 Inflamed seborrheic keratosis: Secondary | ICD-10-CM

## 2020-12-25 DIAGNOSIS — L821 Other seborrheic keratosis: Secondary | ICD-10-CM

## 2020-12-25 DIAGNOSIS — D229 Melanocytic nevi, unspecified: Secondary | ICD-10-CM

## 2020-12-25 NOTE — Patient Instructions (Addendum)
Cryotherapy Aftercare  Wash gently with soap and water everyday.   Apply Vaseline and Band-Aid daily until healed.   Seborrheic Keratosis  What causes seborrheic keratoses? Seborrheic keratoses are harmless, common skin growths that first appear during adult life.  As time goes by, more growths appear.  Some people may develop a large number of them.  Seborrheic keratoses appear on both covered and uncovered body parts.  They are not caused by sunlight.  The tendency to develop seborrheic keratoses can be inherited.  They vary in color from skin-colored to gray, brown, or even black.  They can be either smooth or have a rough, warty surface.   Seborrheic keratoses are superficial and look as if they were stuck on the skin.  Under the microscope this type of keratosis looks like layers upon layers of skin.  That is why at times the top layer may seem to fall off, but the rest of the growth remains and re-grows.    Treatment Seborrheic keratoses do not need to be treated, but can easily be removed in the office.  Seborrheic keratoses often cause symptoms when they rub on clothing or jewelry.  Lesions can be in the way of shaving.  If they become inflamed, they can cause itching, soreness, or burning.  Removal of a seborrheic keratosis can be accomplished by freezing, burning, or surgery. If any spot bleeds, scabs, or grows rapidly, please return to have it checked, as these can be an indication of a skin cancer.   If you have any questions or concerns for your doctor, please call our main line at [phone] and press option 4 to reach your doctor's medical assistant. If no one answers, please leave a voicemail as directed and we will return your call as soon as possible. Messages left after 4 pm will be answered the following business day.   You may also send us a message via MyChart. We typically respond to MyChart messages within 1-2 business days.  For prescription refills, please ask your  pharmacy to contact our office. Our fax number is [phone].  If you have an urgent issue when the clinic is closed that cannot wait until the next business day, you can page your doctor at the number below.    Please note that while we do our best to be available for urgent issues outside of office hours, we are not available 24/7.   If you have an urgent issue and are unable to reach us, you may choose to seek medical care at your doctor's office, retail clinic, urgent care center, or emergency room.  If you have a medical emergency, please immediately call 911 or go to the emergency department.  Pager Numbers  - Dr. Kowalski: [phone]  - Dr. Moye: [phone]  - Dr. Stewart: [phone]  In the event of inclement weather, please call our main line at [phone] for an update on the status of any delays or closures.  Dermatology Medication Tips: Please keep the boxes that topical medications come in in order to help keep track of the instructions about where and how to use these. Pharmacies typically print the medication instructions only on the boxes and not directly on the medication tubes.   If your medication is too expensive, please contact our office at [phone] option 4 or send us a message through MyChart.   We are unable to tell what your co-pay for medications will be in advance as this is different depending on your insurance coverage.   However, we may be able to find a substitute medication at lower cost or fill out paperwork to get insurance to cover a needed medication.   If a prior authorization is required to get your medication covered by your insurance company, please allow us 1-2 business days to complete this process.  Drug prices often vary depending on where the prescription is filled and some pharmacies may offer cheaper prices.  The website www.goodrx.com contains coupons for medications through different pharmacies. The prices here do not  account for what the cost may be with help from insurance (it may be cheaper with your insurance), but the website can give you the price if you did not use any insurance.  - You can print the associated coupon and take it with your prescription to the pharmacy.  - You may also stop by our office during regular business hours and pick up a GoodRx coupon card.  - If you need your prescription sent electronically to a different pharmacy, notify our office through [place] MyChart or by phone at [phone] option 4.  

## 2020-12-25 NOTE — Progress Notes (Signed)
   Follow-Up Visit   Subjective  Gina [name] is a 87 y.o. female who presents for the following: Growth (Back, braline. Present for years, but has become bothersome. Irritated by bra. ).   The following portions of the chart were reviewed this encounter and updated as appropriate:       Review of Systems:  No other skin or systemic complaints except as noted in HPI or Assessment and Plan.  Objective  Well appearing patient in no apparent distress; mood and affect are within normal limits.  A focused examination was performed including back. Relevant physical exam findings are noted in the Assessment and Plan.  Mid Back x 9 (9) Erythematous keratotic or waxy stuck-on papule or plaque.    Assessment & Plan  Inflamed seborrheic keratosis Mid Back x 9  Destruction of lesion - Mid Back x 9  Destruction method: cryotherapy   Informed consent: discussed and consent obtained   Lesion destroyed using liquid nitrogen: Yes   Region frozen until ice ball extended beyond lesion: Yes   Outcome: patient tolerated procedure well with no complications   Post-procedure details: wound care instructions given   Additional details:  Prior to procedure, discussed risks of blister formation, small wound, skin dyspigmentation, or rare scar following cryotherapy. Recommend Vaseline ointment to treated areas while healing.  Seborrheic Keratoses - Stuck-on, waxy, tan-brown papules and/or plaques  - Benign-appearing - Discussed benign etiology and prognosis. - Observe - Call for any changes  Melanocytic Nevi - Tan-brown and/or pink-flesh-colored symmetric macules and papules - Benign appearing on exam today - Observation - Call clinic for new or changing moles - Recommend daily use of broad spectrum spf 30+ sunscreen to sun-exposed areas.    Return in about 2 months (around 02/24/2021) for f/u inflamed SKs.  IJamesetta Orleans, CMA, am acting as scribe for Brendolyn Patty, MD .  Documentation:  I have reviewed the above documentation for accuracy and completeness, and I agree with the above.  Brendolyn Patty MD

## 2021-01-15 ENCOUNTER — Inpatient Hospital Stay: Payer: Medicare HMO | Attending: Oncology

## 2021-01-15 ENCOUNTER — Other Ambulatory Visit: Payer: Self-pay

## 2021-01-15 DIAGNOSIS — D473 Essential (hemorrhagic) thrombocythemia: Secondary | ICD-10-CM | POA: Insufficient documentation

## 2021-01-15 DIAGNOSIS — R0982 Postnasal drip: Secondary | ICD-10-CM | POA: Diagnosis not present

## 2021-01-15 DIAGNOSIS — J040 Acute laryngitis: Secondary | ICD-10-CM | POA: Diagnosis not present

## 2021-01-15 DIAGNOSIS — J069 Acute upper respiratory infection, unspecified: Secondary | ICD-10-CM | POA: Diagnosis not present

## 2021-01-15 LAB — COMPREHENSIVE METABOLIC PANEL
ALT: 14 U/L (ref 0–44)
AST: 20 U/L (ref 15–41)
Albumin: 4.6 g/dL (ref 3.5–5.0)
Alkaline Phosphatase: 82 U/L (ref 38–126)
Anion gap: 11 (ref 5–15)
BUN: 27 mg/dL — ABNORMAL HIGH (ref 8–23)
CO2: 25 mmol/L (ref 22–32)
Calcium: 9.3 mg/dL (ref 8.9–10.3)
Chloride: 96 mmol/L — ABNORMAL LOW (ref 98–111)
Creatinine, Ser: 1.1 mg/dL — ABNORMAL HIGH (ref 0.44–1.00)
GFR, Estimated: 47 mL/min — ABNORMAL LOW (ref >60)
Glucose, Bld: 109 mg/dL — ABNORMAL HIGH (ref 70–99)
Potassium: 4.6 mmol/L (ref 3.5–5.1)
Sodium: 132 mmol/L — ABNORMAL LOW (ref 135–145)
Total Bilirubin: 1.4 mg/dL — ABNORMAL HIGH (ref 0.3–1.2)
Total Protein: 7.8 g/dL (ref 6.5–8.1)

## 2021-01-15 LAB — CBC WITH DIFFERENTIAL/PLATELET
Abs Immature Granulocytes: 0.07 10*3/uL (ref 0.00–0.07)
Basophils Absolute: 0.1 10*3/uL (ref 0.0–0.1)
Basophils Relative: 0 %
Eosinophils Absolute: 0.1 10*3/uL (ref 0.0–0.5)
Eosinophils Relative: 0 %
HCT: 40.8 % (ref 36.0–46.0)
Hemoglobin: 13.7 g/dL (ref 12.0–15.0)
Immature Granulocytes: 0 %
Lymphocytes Relative: 8 %
Lymphs Abs: 1.2 10*3/uL (ref 0.7–4.0)
MCH: 31.8 pg (ref 26.0–34.0)
MCHC: 33.6 g/dL (ref 30.0–36.0)
MCV: 94.7 fL (ref 80.0–100.0)
Monocytes Absolute: 1.5 10*3/uL — ABNORMAL HIGH (ref 0.1–1.0)
Monocytes Relative: 10 %
Neutro Abs: 12.7 10*3/uL — ABNORMAL HIGH (ref 1.7–7.7)
Neutrophils Relative %: 82 %
Platelets: 458 10*3/uL — ABNORMAL HIGH (ref 150–400)
RBC: 4.31 MIL/uL (ref 3.87–5.11)
RDW: 13.7 % (ref 11.5–15.5)
WBC: 15.6 10*3/uL — ABNORMAL HIGH (ref 4.0–10.5)
nRBC: 0 % (ref 0.0–0.2)

## 2021-01-20 ENCOUNTER — Other Ambulatory Visit: Payer: Self-pay

## 2021-01-20 ENCOUNTER — Other Ambulatory Visit: Payer: Self-pay | Admitting: Family Medicine

## 2021-01-20 ENCOUNTER — Ambulatory Visit: Payer: Medicare HMO | Admitting: Obstetrics & Gynecology

## 2021-01-20 ENCOUNTER — Encounter: Payer: Self-pay | Admitting: Obstetrics & Gynecology

## 2021-01-20 VITALS — BP 120/80 | Ht 64.0 in | Wt 155.0 lb

## 2021-01-20 DIAGNOSIS — E782 Mixed hyperlipidemia: Secondary | ICD-10-CM

## 2021-01-20 DIAGNOSIS — N814 Uterovaginal prolapse, unspecified: Secondary | ICD-10-CM

## 2021-01-20 NOTE — Progress Notes (Signed)
HPI:      [name] is a 87 y.o. [phone] who presents today for her pessary follow up and examination related to her pelvic floor weakening.  Pt reports tolerating the pessary well with no vaginal bleeding and no vaginal discharge.  Symptoms of pelvic floor weakening have greatly improved. She is voiding and defecating without difficulty. She currently has a Ring type size #3 pessary.  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 Also,  has a past surgical history that includes Tonsillectomy; Appendectomy; Cesarean section; Joint replacement (Right); Thyroidectomy; Colon surgery; Cataract extraction w/PHACO (Left, 03/25/2015); Adenoidectomy; biopsy, right breast ; Total hip arthroplasty; Thyroid lobectomy; and Upper gi endoscopy (10/17/2008)., family history includes Dementia in her maternal grandfather; Stroke in her father and mother.,  reports that she has quit smoking. She has never used smokeless tobacco. She reports that she does not drink alcohol and does not use drugs.  She has a current medication list which includes the following prescription(s): acetaminophen, amlodipine, aspirin ec, atorvastatin, vitamin d3, famotidine, hydroxyurea, irbesartan, ketoconazole, levothyroxine, nystatin ointment, and systane. Also, is allergic to novocain [procaine].  Review of Systems  All other systems reviewed and are negative.  Objective: BP 120/80   Ht 5\' 4"  (1.626 m)   Wt 155 lb (70.3 kg)   BMI 26.61  kg/m  Physical Exam Constitutional:      General: She is not in acute distress.    Appearance: She is well-developed.  Genitourinary:     Right Labia: No rash or tenderness.    Left Labia: No tenderness or rash.    No vaginal erythema or bleeding.     Moderate vaginal atrophy present.     Right Adnexa: not tender and no mass present.    Left Adnexa: not tender and no mass present.    No cervical motion tenderness, discharge, polyp or nabothian cyst.     Uterus is prolapsed.     Uterus is not enlarged.     No uterine mass detected.    Pelvic exam was performed with patient in the lithotomy position.  HENT:     Head: Normocephalic and atraumatic.     Nose: Nose normal.  Abdominal:     General: There is no distension.     Palpations: Abdomen is soft.     Tenderness: There is no abdominal tenderness.  Musculoskeletal:        General: Normal range of motion.  Neurological:     Mental Status: She is alert and oriented to person, place, and time.     Cranial Nerves: No cranial nerve deficit.  Skin:    General: Skin is warm and dry.  Psychiatric:        Attention and Perception: Attention normal.        Mood and Affect: Mood and affect normal.        Speech: Speech normal.        Behavior: Behavior normal.        Thought Content: Thought content normal.        Judgment: Judgment normal.    Pessary Care Pessary removed and cleaned.  Vagina checked - without erosions - pessary replaced.  A/P:   ICD-10-CM   1. Uterine prolapse  N81.4     2. Cystocele with prolapse  N81.4      Pessary was cleaned and replaced today. Instructions given for care. Concerning symptoms to observe for are counseled to patient. Follow up scheduled for 3-4 months.  A total of 20 minutes were spent face-to-face with the patient as well as preparation, review, communication, and documentation during this encounter.   Barnett Applebaum, MD, [facility], [facility] 01/20/2021   11:29 AM

## 2021-02-13 ENCOUNTER — Ambulatory Visit: Payer: Medicare HMO | Admitting: Internal Medicine

## 2021-02-21 ENCOUNTER — Other Ambulatory Visit: Payer: Self-pay

## 2021-02-21 ENCOUNTER — Ambulatory Visit (INDEPENDENT_AMBULATORY_CARE_PROVIDER_SITE_OTHER): Payer: Medicare HMO | Admitting: Internal Medicine

## 2021-02-21 ENCOUNTER — Encounter: Payer: Self-pay | Admitting: Internal Medicine

## 2021-02-21 VITALS — BP 136/68 | HR 92 | Temp 98.0°F | Resp 18 | Ht 63.0 in | Wt 153.0 lb

## 2021-02-21 DIAGNOSIS — N1832 Chronic kidney disease, stage 3b: Secondary | ICD-10-CM

## 2021-02-21 DIAGNOSIS — I10 Essential (primary) hypertension: Secondary | ICD-10-CM

## 2021-02-21 DIAGNOSIS — E890 Postprocedural hypothyroidism: Secondary | ICD-10-CM | POA: Diagnosis not present

## 2021-02-21 DIAGNOSIS — R7303 Prediabetes: Secondary | ICD-10-CM | POA: Diagnosis not present

## 2021-02-21 DIAGNOSIS — K219 Gastro-esophageal reflux disease without esophagitis: Secondary | ICD-10-CM

## 2021-02-21 DIAGNOSIS — E782 Mixed hyperlipidemia: Secondary | ICD-10-CM

## 2021-02-21 DIAGNOSIS — R197 Diarrhea, unspecified: Secondary | ICD-10-CM

## 2021-02-21 MED ORDER — FAMOTIDINE 20 MG PO TABS: ORAL_TABLET | ORAL | 5 refills | Status: DC

## 2021-02-21 NOTE — Progress Notes (Signed)
Established Patient Office Visit  Subjective:  Patient ID: Gina [name], female    DOB: 1926-09-30  Age: 87 y.o. MRN: [id number]  CC:  Chief Complaint  Patient presents with   Follow-up   Hyperlipidemia   Hypertension   Hypothyroidism    HPI Gina [name] presents for follow up on chronic medical conditions. Recently fell on 12/3 on carpet onto bilateral knees after she missed her walker, didn't hit head or lose consciousness, no knee pain pain now or instability, able to bear weight.   Watery stools and abdominal bloating for last week. Takes miralax every morning for constipation. Having loose stools about 1-2x a day, no blood or dark stools. No changes in medications. Lymphadenopathy in neck, coughing dry, sneezing, as well. Took home COVID test that was negative.   Hypertension: -Medications: Irbesartan 150, Amlodipine 5 -Patient is compliant with above medications and reports no side effects. -Checking BP at home (average): doing well 110/60-70 -Denies any SOB, CP, vision changes, LE edema or symptoms of hypotension  HLD: -Medications: Lipitor 20 -Patient is compliant with above medications and reports no side effects.  -Last lipid panel: 1/22: TC 166, HDL 55, triglycerides 134, LDL 88  Pre-Diabetes: -Last A1c in 7/22 5.9%  Hypothyroidism: -Medications: Currently taking Euthyroxine, insurance will only pay for Levothyroxine 88 mcg, planning to switch when out of current meds -Patient is compliant with the above medication (s) at the above dose and reports no medication side effects.  -Denies weight changes, cold./heat intolerance, skin changes, anxiety/palpitations  -Last TSH: 6/22 1.54  GERD: -Currently on Pepcid 20 mg daily, doing well   CKD3b: -Last Cr 1.10 11/22 -Follows with Nephrology sees next month, follows every 6 months   Health Maintenance: -Gynecology every 4 months for pessary, mammogram   Past Medical History:  Diagnosis Date   Breast mass in  female    right breast   Cataract    Difficult intubation 03/03/2016   September 06, 2013; left nasal fiberoptic intubation #7 ETT; see letter from Dr. Loanne Drilling, Dept of Anesthesiology, [facility]   Diverticulitis    Diverticulosis 05/28/2015   Essential thrombocytosis ([place]) 07/23/2015   Genital prolapse    GERD (gastroesophageal reflux disease)    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   Confirmed by Dr. Mike Gip   Hemorrhoids 07/29/2016   Hyperlipemia    Hyperlipidemia    history of    Hypothyroidism    Mild atherosclerosis of carotid artery 06/25/2015   Morton's neuroma    MRI contraindicated due to metal implant    Prolapse of uterus    Radiculopathy    Renal cyst    Rosacea    Shingles    Thrombocytosis     Past Surgical History:  Procedure Laterality Date   ADENOIDECTOMY     APPENDECTOMY     biopsy, right breast      CATARACT EXTRACTION W/PHACO Left 03/25/2015   Procedure: CATARACT EXTRACTION PHACO AND INTRAOCULAR LENS PLACEMENT ([place]);  Surgeon: Estill Cotta, MD;  Location: [facility] ORS;  Service: Ophthalmology;  Laterality: Left;  [place] 02:09 AP% 27.3 CDE 59.97 fluid pack lot #[id number]   CESAREAN SECTION     COLON SURGERY     JOINT REPLACEMENT Right    total hip   THYROID LOBECTOMY     THYROIDECTOMY     partial   TONSILLECTOMY     TOTAL HIP ARTHROPLASTY     UPPER GI ENDOSCOPY  10/17/2008    Family History  Problem Relation Age of Onset  Stroke Mother    Stroke Father    Dementia Maternal Grandfather     Social History   Socioeconomic History   Marital status: Widowed    Spouse name: Not on file   Number of children: 2   Years of education: Not on file   Highest education level: Not on file  Occupational History   Occupation: retired   Tobacco Use   Smoking status: Former   Smokeless tobacco: Never  Scientific laboratory technician Use: Never used  Substance and Sexual Activity   Alcohol use: No    Alcohol/week: 0.0 standard drinks   Drug use: No   Sexual activity: Not Currently   Other Topics Concern   Not on file  Social History Narrative   Lost her husband May 5 th, 2021   She does not drive   She has two sons that live in town   Social Determinants of Radio broadcast assistant Strain: Not on file  Food Insecurity: Not on file  Transportation Needs: Not on file  Physical Activity: Not on file  Stress: Not on file  Social Connections: Not on file  Intimate Partner Violence: Not on file    Outpatient Medications Prior to Visit  Medication Sig Dispense Refill   Acetaminophen 500 MG coapsule Take by mouth every 4 (four) hours as needed for fever.     amLODipine (NORVASC) 5 MG tablet Take 1 tablet (5 mg total) by mouth daily. 90 tablet 3   aspirin EC 81 MG tablet Take 81 mg by mouth daily.      atorvastatin (LIPITOR) 20 MG tablet TAKE 1 TABLET BY MOUTH AT BEDTIME 90 tablet 0   Cholecalciferol (VITAMIN D3) 1000 units CAPS Take by mouth daily.      famotidine (PEPCID) 20 MG tablet Take one tab once to twice daily as needed 60 tablet 5   hydroxyurea (HYDREA) 500 MG capsule TAKE 1 CAPSULE BY MOUTH 4 DAYS EVERY WEEK 60 capsule 3   irbesartan (AVAPRO) 150 MG tablet Take 1 tablet (150 mg total) by mouth daily. 90 tablet 3   ketoconazole (NIZORAL) 2 % shampoo Apply 1 application topically 2 (two) times a week. Wash scalp 2 times weekly, let sit 5 minutes and rinse out 120 mL 6   levothyroxine (SYNTHROID) 88 MCG tablet TAKE 1 TABLET BY MOUTH ONCE DAILY BEFORE BREAKFAST 90 tablet 3   nystatin ointment (MYCOSTATIN) Apply 1 application topically 2 (two) times daily. 30 g 0   Polyethyl Glycol-Propyl Glycol (SYSTANE) 0.4-0.3 % SOLN Apply to eye.     No facility-administered medications prior to visit.    Allergies  Allergen Reactions   Novocain [Procaine] Palpitations    ROS Review of Systems  Constitutional:  Negative for chills and fever.  HENT:  Positive for congestion, postnasal drip, rhinorrhea and sore throat. Negative for ear pain, sinus pressure and  sinus pain.   Eyes:  Negative for visual disturbance.  Respiratory:  Positive for cough. Negative for shortness of breath and wheezing.   Cardiovascular:  Negative for chest pain and palpitations.  Gastrointestinal:  Positive for diarrhea. Negative for abdominal pain, blood in stool, nausea and vomiting.  Skin: Negative.   Neurological:  Negative for dizziness and headaches.     Objective:    Physical Exam Constitutional:      Appearance: Normal appearance.     Comments: Uses walker   HENT:     Head: Normocephalic and atraumatic.     Right Ear:  Tympanic membrane, ear canal and external ear normal.     Left Ear: Tympanic membrane, ear canal and external ear normal.     Nose: Nose normal.     Mouth/Throat:     Mouth: Mucous membranes are moist.     Pharynx: Oropharynx is clear.     Comments: PND present Eyes:     Conjunctiva/sclera: Conjunctivae normal.  Cardiovascular:     Rate and Rhythm: Normal rate and regular rhythm.  Pulmonary:     Effort: Pulmonary effort is normal.     Breath sounds: Normal breath sounds.  Abdominal:     General: There is no distension.     Palpations: Abdomen is soft.     Tenderness: There is no abdominal tenderness.  Musculoskeletal:     Comments: Mild chronic BLE non-pitting edema  Lymphadenopathy:     Cervical: Cervical adenopathy present.  Skin:    General: Skin is warm and dry.  Neurological:     General: No focal deficit present.     Mental Status: She is alert. Mental status is at baseline.  Psychiatric:        Mood and Affect: Mood normal.        Behavior: Behavior normal.    BP 136/68    Pulse 92    Temp 98 F (36.7 C)    Resp 18    Ht 5\' 3"  (1.6 m)    Wt 153 lb (69.4 kg)    SpO2 96%    BMI 27.10 kg/m  Wt Readings from Last 3 Encounters:  01/20/21 155 lb (70.3 kg)  10/14/20 152 lb 3.2 oz (69 kg)  09/18/20 151 lb (68.5 kg)     Health Maintenance Due  Topic Date Due   Zoster Vaccines- Shingrix (1 of 2) Never done   DEXA SCAN   Never done   COVID-19 Vaccine (3 - Pfizer risk series) 05/28/2019   INFLUENZA VACCINE  09/23/2020    There are no preventive care reminders to display for this patient.  Lab Results  Component Value Date   TSH 1.54 08/12/2020   Lab Results  Component Value Date   WBC 15.6 (H) 01/15/2021   HGB 13.7 01/15/2021   HCT 40.8 01/15/2021   MCV 94.7 01/15/2021   PLT 458 (H) 01/15/2021   Lab Results  Component Value Date   NA 132 (L) 01/15/2021   K 4.6 01/15/2021   CO2 25 01/15/2021   GLUCOSE 109 (H) 01/15/2021   BUN 27 (H) 01/15/2021   CREATININE 1.10 (H) 01/15/2021   BILITOT 1.4 (H) 01/15/2021   ALKPHOS 82 01/15/2021   AST 20 01/15/2021   ALT 14 01/15/2021   PROT 7.8 01/15/2021   ALBUMIN 4.6 01/15/2021   CALCIUM 9.3 01/15/2021   ANIONGAP 11 01/15/2021   Lab Results  Component Value Date   CHOL 166 03/20/2020   Lab Results  Component Value Date   HDL 55 03/20/2020   Lab Results  Component Value Date   LDLCALC 88 03/20/2020   Lab Results  Component Value Date   TRIG 134 03/20/2020   Lab Results  Component Value Date   CHOLHDL 3.0 03/20/2020   Lab Results  Component Value Date   HGBA1C 5.9 (A) 08/27/2020      Assessment & Plan:   1. Hypertension, unspecified type: Stable, blood pressure controlled on current medications.   2. Mixed hyperlipidemia: Stable, continue statin. Recheck lipid panel in 3 months.   3. Postoperative hypothyroidism: About to switch to  Levothyroxine due to insurance coverage, plan to recheck TSH in 3 months.   4. Prediabetes: Stable, last A1c reviewed.   5. Stage 3b chronic kidney disease ([place]): Kidney function stable on blood work, following with Nephrology.   6. GERD without esophagitis: Stable, doing well on Pepcid, needs refills today.  - famotidine (PEPCID) 20 MG tablet; Take one tab once to twice daily as needed  Dispense: 60 tablet; Refill: 5  7. Diarrhea, unspecified type: Having loose stools along with some minor  respiratory symptoms, sore throat. COVID test today, symptoms most likely due to viral illness, increase oral fluid intake and decrease Miralax while having loose stools, stool studies if symptoms continue.   - Novel Coronavirus, NAA ([facility])   Follow-up: Return in about 3 months (around 05/22/2021).    Teodora Medici, DO

## 2021-02-21 NOTE — Patient Instructions (Addendum)
It was great seeing you today!  Plan discussed at today's visit: -Refills of Pepcid sent to [facility] test  -Continue to stay well hydrated, eat increased amount of fiber and back down on Miralax while having loose stools  -No changes to medications  Follow up in: 3 months   Take care and let us know if you have any questions or concerns prior to your next visit.  Dr. Rosana Berger

## 2021-02-22 LAB — NOVEL CORONAVIRUS, NAA: SARS-CoV-2, NAA: NOT DETECTED

## 2021-02-22 LAB — SPECIMEN STATUS REPORT

## 2021-02-22 LAB — SARS-COV-2, NAA 2 DAY TAT

## 2021-03-10 ENCOUNTER — Ambulatory Visit: Payer: Medicare HMO | Admitting: Dermatology

## 2021-04-16 ENCOUNTER — Other Ambulatory Visit: Payer: Self-pay | Admitting: *Deleted

## 2021-04-16 DIAGNOSIS — D473 Essential (hemorrhagic) thrombocythemia: Secondary | ICD-10-CM

## 2021-04-17 ENCOUNTER — Other Ambulatory Visit: Payer: Self-pay | Admitting: Family Medicine

## 2021-04-17 DIAGNOSIS — N1832 Chronic kidney disease, stage 3b: Secondary | ICD-10-CM

## 2021-04-17 DIAGNOSIS — I10 Essential (primary) hypertension: Secondary | ICD-10-CM

## 2021-04-18 NOTE — Progress Notes (Signed)
?[facility]  ?[phone] [phone] ? ?IDRhea [name] OB: [birth year]  MR#: [id number]  DGU#:[id number] ? ?Patient Care Team: ?Delsa Grana, PA-C as PCP - General (Family Medicine) ?Murlean Iba, MD (Internal Medicine) ?Gae Dry, MD as Referring Physician (Obstetrics and Gynecology) ?Estill Cotta, MD (Ophthalmology) ?Anabel Bene, MD as Referring Physician (Neurology) ?Beverly Gust, MD (Otolaryngology) ?Brendolyn Patty, MD (Dermatology) ?Lloyd Huger, MD as Consulting Physician (Oncology) ? ?CHIEF COMPLAINT: Essential thrombocytosis ? ?INTERVAL HISTORY: Patient returns to clinic today for repeat laboratory work and routine 64-month evaluation.  She currently feels well and is asymptomatic.  She is tolerating Hydrea without significant side effects.  She has no neurologic complaints.  She denies any recent fevers or illnesses.  She has a good appetite and denies weight loss.  She denies any chest pain, shortness of breath, cough, or hemoptysis.  She denies any nausea, vomiting, constipation, or diarrhea.  She has no urinary complaints.  Patient offers no specific complaints today. ? ?REVIEW OF SYSTEMS:   ?Review of Systems  ?Constitutional: Negative.  Negative for fever, malaise/fatigue and weight loss.  ?Respiratory: Negative.  Negative for cough, hemoptysis and shortness of breath.   ?Cardiovascular: Negative.  Negative for chest pain and leg swelling.  ?Gastrointestinal: Negative.  Negative for abdominal pain.  ?Genitourinary: Negative.  Negative for dysuria.  ?Musculoskeletal: Negative.  Negative for back pain.  ?Skin: Negative.  Negative for rash.  ?Neurological: Negative.  Negative for focal weakness, weakness and headaches.  ?Psychiatric/Behavioral: Negative.  The patient is not nervous/anxious.   ? ?As per HPI. Otherwise, a complete review of systems is negative. ? ?PAST MEDICAL HISTORY: ?Past Medical History:  ?Diagnosis Date  ? Breast  mass in female   ? right breast  ? Cataract   ? Difficult intubation 03/03/2016  ? September 06, 2013; left nasal fiberoptic intubation #7 ETT; see letter from Dr. Loanne Drilling, Dept of Anesthesiology, [facility]  ? Diverticulitis   ? Diverticulosis 05/28/2015  ? Essential thrombocytosis ([place]) 07/23/2015  ? Genital prolapse   ? GERD (gastroesophageal reflux disease)   ? 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 Confirmed by Dr. Mike Gip  ? Hemorrhoids 07/29/2016  ? Hyperlipemia   ? Hyperlipidemia   ? history of   ? Hypothyroidism   ? Mild atherosclerosis of carotid artery 06/25/2015  ? Morton's neuroma   ? MRI contraindicated due to metal implant   ? Prolapse of uterus   ? Radiculopathy   ? Renal cyst   ? Rosacea   ? Shingles   ? Thrombocytosis   ? ? ?PAST SURGICAL HISTORY: ?Past Surgical History:  ?Procedure Laterality Date  ? ADENOIDECTOMY    ? APPENDECTOMY    ? biopsy, right breast     ? CATARACT EXTRACTION W/PHACO Left 03/25/2015  ? Procedure: CATARACT EXTRACTION PHACO AND INTRAOCULAR LENS PLACEMENT ([facility]);  Surgeon: Estill Cotta, MD;  Location: [facility] ORS;  Service: Ophthalmology;  Laterality: Left;  [place] 02:09 ?AP% 27.3 ?CDE 59.97 ?fluid pack lot #[id number]  ? CESAREAN SECTION    ? COLON SURGERY    ? JOINT REPLACEMENT Right   ? total hip  ? THYROID LOBECTOMY    ? THYROIDECTOMY    ? partial  ? TONSILLECTOMY    ? TOTAL HIP ARTHROPLASTY    ? UPPER GI ENDOSCOPY  10/17/2008  ? ? ?FAMILY HISTORY: ?Family History  ?Problem Relation Age of Onset  ? Stroke Mother   ? Stroke Father   ? Dementia Maternal Grandfather   ? ? ?  ADVANCED DIRECTIVES (Y/N):  N ? ?HEALTH MAINTENANCE: ?Social History  ? ?Tobacco Use  ? Smoking status: Former  ? Smokeless tobacco: Never  ?Vaping Use  ? Vaping Use: Never used  ?Substance Use Topics  ? Alcohol use: No  ?  Alcohol/week: 0.0 standard drinks  ? Drug use: No  ? ? ? Colonoscopy: ? PAP: ? Bone density: ? Lipid panel: ? ?Allergies  ?Allergen Reactions  ? Novocain [Procaine] Palpitations  ? ? ?Current Outpatient Medications   ?Medication Sig Dispense Refill  ? Acetaminophen 500 MG coapsule Take by mouth every 4 (four) hours as needed for fever.    ? amLODipine (NORVASC) 5 MG tablet Take 1 tablet by mouth once daily 90 tablet 0  ? aspirin EC 81 MG tablet Take 81 mg by mouth daily.     ? atorvastatin (LIPITOR) 20 MG tablet TAKE 1 TABLET BY MOUTH AT BEDTIME 90 tablet 0  ? Cholecalciferol (VITAMIN D3) 1000 units CAPS Take by mouth daily.     ? famotidine (PEPCID) 20 MG tablet Take one tab once to twice daily as needed 60 tablet 5  ? hydroxyurea (HYDREA) 500 MG capsule TAKE 1 CAPSULE BY MOUTH 4 DAYS EVERY WEEK 60 capsule 3  ? irbesartan (AVAPRO) 150 MG tablet Take 1 tablet (150 mg total) by mouth daily. 90 tablet 3  ? ketoconazole (NIZORAL) 2 % shampoo Apply 1 application topically 2 (two) times a week. Wash scalp 2 times weekly, let sit 5 minutes and rinse out 120 mL 6  ? levothyroxine (SYNTHROID) 88 MCG tablet TAKE 1 TABLET BY MOUTH ONCE DAILY BEFORE BREAKFAST 90 tablet 3  ? nystatin ointment (MYCOSTATIN) Apply 1 application topically 2 (two) times daily. 30 g 0  ? Polyethyl Glycol-Propyl Glycol (SYSTANE) 0.4-0.3 % SOLN Apply to eye.    ? ?No current facility-administered medications for this visit.  ? ? ?OBJECTIVE: ?Vitals:  ? 04/23/21 1046  ?BP: 132/75  ?Pulse: 88  ?Resp: 18  ?Temp: (!) 96.8 ?F (36 ?C)  ?SpO2: 99%  ?   Body mass index is 27.86 kg/m?Marland Kitchen    ECOG FS:0 - Asymptomatic ? ?General: Well-developed, well-nourished, no acute distress. ?Eyes: [name] conjunctiva, anicteric sclera. ?HEENT: Normocephalic, moist mucous membranes. ?Lungs: No audible wheezing or coughing. ?Heart: Regular rate and rhythm. ?Abdomen: Soft, nontender, no obvious distention. ?Musculoskeletal: No edema, cyanosis, or clubbing. ?Neuro: Alert, answering all questions appropriately. Cranial nerves grossly intact. ?Skin: No rashes or petechiae noted. ?Psych: Normal affect. ? ? ?LAB RESULTS: ? ?Lab Results  ?Component Value Date  ? NA 135 04/23/2021  ? K 4.5 04/23/2021   ? CL 103 04/23/2021  ? CO2 25 04/23/2021  ? GLUCOSE 123 (H) 04/23/2021  ? BUN 30 (H) 04/23/2021  ? CREATININE 1.23 (H) 04/23/2021  ? CALCIUM 9.4 04/23/2021  ? PROT 7.6 04/23/2021  ? ALBUMIN 4.3 04/23/2021  ? AST 22 04/23/2021  ? ALT 16 04/23/2021  ? ALKPHOS 88 04/23/2021  ? BILITOT 0.9 04/23/2021  ? GFRNONAA 41 (L) 04/23/2021  ? GFRAA 43 (L) 08/12/2020  ? ? ?Lab Results  ?Component Value Date  ? WBC 9.6 04/23/2021  ? NEUTROABS 7.2 04/23/2021  ? HGB 14.5 04/23/2021  ? HCT 43.5 04/23/2021  ? MCV 94.8 04/23/2021  ? PLT 450 (H) 04/23/2021  ? ? ? ?STUDIES: ?No results found. ? ?ASSESSMENT: Essential thrombocytosis ? ?PLAN:  ? ?1. Essential thrombocytosis: Platelet count remains mildly elevated at 450, but we will continue current Hydrea regimen of 4 days a week on Monday,  Wednesday, Friday, and Saturday. Continue 81 mg of aspirin.  No further interventions are needed.  Return to clinic in 3 months for laboratory work only and then in 6 months for laboratory work and further evaluation. ?2.  Renal insufficiency: Chronic and unchanged.  Continue follow-up with nephrology as scheduled. ? ?I spent a total of 20 minutes reviewing chart data, face-to-face evaluation with the patient, counseling and coordination of care as detailed above. ? ? ? ?Patient expressed understanding and was in agreement with this plan. She also understands that She can call clinic at any time with any questions, concerns, or complaints.  ? ? ?Lloyd Huger, MD   04/23/2021 3:53 PM ? ? ? ? ?

## 2021-04-22 ENCOUNTER — Other Ambulatory Visit: Payer: Medicare HMO

## 2021-04-22 ENCOUNTER — Ambulatory Visit: Payer: Medicare HMO | Admitting: Oncology

## 2021-04-22 DIAGNOSIS — R600 Localized edema: Secondary | ICD-10-CM | POA: Diagnosis not present

## 2021-04-22 DIAGNOSIS — N1832 Chronic kidney disease, stage 3b: Secondary | ICD-10-CM | POA: Diagnosis not present

## 2021-04-22 DIAGNOSIS — E875 Hyperkalemia: Secondary | ICD-10-CM | POA: Diagnosis not present

## 2021-04-22 DIAGNOSIS — N281 Cyst of kidney, acquired: Secondary | ICD-10-CM | POA: Diagnosis not present

## 2021-04-22 DIAGNOSIS — E871 Hypo-osmolality and hyponatremia: Secondary | ICD-10-CM | POA: Diagnosis not present

## 2021-04-22 DIAGNOSIS — N1831 Chronic kidney disease, stage 3a: Secondary | ICD-10-CM | POA: Diagnosis not present

## 2021-04-23 ENCOUNTER — Other Ambulatory Visit: Payer: Self-pay

## 2021-04-23 ENCOUNTER — Inpatient Hospital Stay: Payer: Medicare HMO | Attending: Oncology | Admitting: Oncology

## 2021-04-23 ENCOUNTER — Inpatient Hospital Stay: Payer: Medicare HMO

## 2021-04-23 VITALS — BP 132/75 | HR 88 | Temp 96.8°F | Resp 18 | Ht 63.0 in | Wt 157.3 lb

## 2021-04-23 DIAGNOSIS — D473 Essential (hemorrhagic) thrombocythemia: Secondary | ICD-10-CM | POA: Diagnosis not present

## 2021-04-23 DIAGNOSIS — N289 Disorder of kidney and ureter, unspecified: Secondary | ICD-10-CM | POA: Insufficient documentation

## 2021-04-23 DIAGNOSIS — Z79899 Other long term (current) drug therapy: Secondary | ICD-10-CM | POA: Diagnosis not present

## 2021-04-23 LAB — CBC WITH DIFFERENTIAL/PLATELET
Abs Immature Granulocytes: 0.04 10*3/uL (ref 0.00–0.07)
Basophils Absolute: 0 10*3/uL (ref 0.0–0.1)
Basophils Relative: 0 %
Eosinophils Absolute: 0.2 10*3/uL (ref 0.0–0.5)
Eosinophils Relative: 2 %
HCT: 43.5 % (ref 36.0–46.0)
Hemoglobin: 14.5 g/dL (ref 12.0–15.0)
Immature Granulocytes: 0 %
Lymphocytes Relative: 15 %
Lymphs Abs: 1.4 10*3/uL (ref 0.7–4.0)
MCH: 31.6 pg (ref 26.0–34.0)
MCHC: 33.3 g/dL (ref 30.0–36.0)
MCV: 94.8 fL (ref 80.0–100.0)
Monocytes Absolute: 0.8 10*3/uL (ref 0.1–1.0)
Monocytes Relative: 8 %
Neutro Abs: 7.2 10*3/uL (ref 1.7–7.7)
Neutrophils Relative %: 75 %
Platelets: 450 10*3/uL — ABNORMAL HIGH (ref 150–400)
RBC: 4.59 MIL/uL (ref 3.87–5.11)
RDW: 14.5 % (ref 11.5–15.5)
WBC: 9.6 10*3/uL (ref 4.0–10.5)
nRBC: 0 % (ref 0.0–0.2)

## 2021-04-23 LAB — COMPREHENSIVE METABOLIC PANEL
ALT: 16 U/L (ref 0–44)
AST: 22 U/L (ref 15–41)
Albumin: 4.3 g/dL (ref 3.5–5.0)
Alkaline Phosphatase: 88 U/L (ref 38–126)
Anion gap: 7 (ref 5–15)
BUN: 30 mg/dL — ABNORMAL HIGH (ref 8–23)
CO2: 25 mmol/L (ref 22–32)
Calcium: 9.4 mg/dL (ref 8.9–10.3)
Chloride: 103 mmol/L (ref 98–111)
Creatinine, Ser: 1.23 mg/dL — ABNORMAL HIGH (ref 0.44–1.00)
GFR, Estimated: 41 mL/min — ABNORMAL LOW (ref >60)
Glucose, Bld: 123 mg/dL — ABNORMAL HIGH (ref 70–99)
Potassium: 4.5 mmol/L (ref 3.5–5.1)
Sodium: 135 mmol/L (ref 135–145)
Total Bilirubin: 0.9 mg/dL (ref 0.3–1.2)
Total Protein: 7.6 g/dL (ref 6.5–8.1)

## 2021-04-23 NOTE — Progress Notes (Signed)
Reports numbness in hands and feet x several weeks. Pt c/o swelling in legs. Pt reports constantly being cold ? ?

## 2021-04-30 DIAGNOSIS — Z961 Presence of intraocular lens: Secondary | ICD-10-CM | POA: Diagnosis not present

## 2021-05-05 ENCOUNTER — Ambulatory Visit: Payer: Medicare HMO | Admitting: Dermatology

## 2021-05-05 ENCOUNTER — Other Ambulatory Visit: Payer: Self-pay

## 2021-05-05 DIAGNOSIS — L219 Seborrheic dermatitis, unspecified: Secondary | ICD-10-CM | POA: Diagnosis not present

## 2021-05-05 DIAGNOSIS — L300 Nummular dermatitis: Secondary | ICD-10-CM

## 2021-05-05 DIAGNOSIS — D22121 Melanocytic nevi of left upper eyelid, including canthus: Secondary | ICD-10-CM | POA: Diagnosis not present

## 2021-05-05 DIAGNOSIS — D225 Melanocytic nevi of trunk: Secondary | ICD-10-CM | POA: Diagnosis not present

## 2021-05-05 DIAGNOSIS — D210 Benign neoplasm of connective and other soft tissue of head, face and neck: Secondary | ICD-10-CM | POA: Diagnosis not present

## 2021-05-05 DIAGNOSIS — D492 Neoplasm of unspecified behavior of bone, soft tissue, and skin: Secondary | ICD-10-CM

## 2021-05-05 DIAGNOSIS — D229 Melanocytic nevi, unspecified: Secondary | ICD-10-CM | POA: Diagnosis not present

## 2021-05-05 DIAGNOSIS — L814 Other melanin hyperpigmentation: Secondary | ICD-10-CM

## 2021-05-05 DIAGNOSIS — D2239 Melanocytic nevi of other parts of face: Secondary | ICD-10-CM

## 2021-05-05 DIAGNOSIS — L821 Other seborrheic keratosis: Secondary | ICD-10-CM | POA: Diagnosis not present

## 2021-05-05 DIAGNOSIS — Z1283 Encounter for screening for malignant neoplasm of skin: Secondary | ICD-10-CM | POA: Diagnosis not present

## 2021-05-05 DIAGNOSIS — L578 Other skin changes due to chronic exposure to nonionizing radiation: Secondary | ICD-10-CM | POA: Diagnosis not present

## 2021-05-05 MED ORDER — KETOCONAZOLE 2 % EX SHAM: 1.0000 "application " | MEDICATED_SHAMPOO | CUTANEOUS | 11 refills | Status: DC

## 2021-05-05 NOTE — Progress Notes (Signed)
? ?Follow-Up Visit ?  ?Subjective  ?Gina [name] is a 88 y.o. female who presents for the following: Total body skin exam (Check mole L neck), Nummular derm (Bil lower legs, TMC 0.1% cr to L leg), and Seborrheic Dermatitis (Face, Not using Ketoconazole 2% cr/Scalp, Ketoconazole 2% shampoo 3x/wk). Itchy rash on leg, hasn't tried to treat yet. She also has a couple moles on her face that get irritated that she wants removed. ? ?The patient presents for Total-Body Skin Exam (TBSE) for skin cancer screening and mole check.  The patient has spots, moles and lesions to be evaluated, some may be new or changing and the patient has concerns that these could be cancer.  ? ?The following portions of the chart were reviewed this encounter and updated as appropriate:  ?  ?  ? ?Review of Systems:  No other skin or systemic complaints except as noted in HPI or Assessment and Plan. ? ?Objective  ?Well appearing patient in no apparent distress; mood and affect are within normal limits. ? ?A full examination was performed including scalp, head, eyes, ears, nose, lips, neck, chest, axillae, abdomen, back, buttocks, bilateral upper extremities, bilateral lower extremities, hands, feet, fingers, toes, fingernails, and toenails. All findings within normal limits unless otherwise noted below. ? ?R spinal upper back ?7.85m speckled brown papule ? ?L lat upper eyelid ?6.0345mflesh pap ? ? ? ? ?glabella ?4.45m73mlesh pap ? ? ? ? ?L and R calf ?Pink excoriated scaly patches ? ?Head - Anterior (Face) ?Eyebrows and scalp clear ? ? ? ?Assessment & Plan  ? ?Lentigines ?- Scattered tan macules ?- Due to sun exposure ?- Benign-appearing, observe ?- Recommend daily broad spectrum sunscreen SPF 30+ to sun-exposed areas, reapply every 2 hours as needed. ?- Call for any changes ?- back ? ?Seborrheic Keratoses ?- Stuck-on, waxy, tan-brown papules and/or plaques  ?- Benign-appearing ?- Discussed benign etiology and prognosis. ?- Observe ?- Call for any  changes ?- back, face, arms ? ?Melanocytic Nevi ?- Tan-brown and/or pink-flesh-colored symmetric macules and papules ?- Benign appearing on exam today ?- Observation ?- Call clinic for new or changing moles ?- Recommend daily use of broad spectrum spf 30+ sunscreen to sun-exposed areas.  ? ? ?Actinic Damage ?- Chronic condition, secondary to cumulative UV/sun exposure ?- diffuse scaly erythematous macules with underlying dyspigmentation ?- Recommend daily broad spectrum sunscreen SPF 30+ to sun-exposed areas, reapply every 2 hours as needed.  ?- Staying in the shade or wearing long sleeves, sun glasses (UVA+UVB protection) and wide brim hats (4-inch brim around the entire circumference of the hat) are also recommended for sun protection.  ?- Call for new or changing lesions. ?- chest ? ?Skin cancer screening performed today. ? ?Nevus ?R spinal upper back ? ?Benign-appearing.  Observation.  Call clinic for new or changing moles.  Recommend daily use of broad spectrum spf 30+ sunscreen to sun-exposed areas.   ? ?Neoplasm of skin (2) ?L lat upper eyelid ? ?Epidermal / dermal shaving ? ?Lesion diameter (cm):  0.6 ?Informed consent: discussed and consent obtained   ?Patient was prepped and draped in usual sterile fashion: area prepped with alcohol. ?Anesthesia: the lesion was anesthetized in a standard fashion   ?Anesthetic:  1% lidocaine w/ epinephrine 1-100,000 buffered w/ 8.4% NaHCO3 ?Instrument used: flexible razor blade   ?Hemostasis achieved with: pressure, aluminum chloride and electrodesiccation   ?Outcome: patient tolerated procedure well   ?Post-procedure details: wound care instructions given   ?Post-procedure details comment:  Ointment  and small bandage applied ? ?Specimen 1 - Surgical pathology ?Differential Diagnosis: D48.5 Irritated Nevus vs Neurofibroma  ? ?Check Margins: No ?6.51m flesh pap ? ?glabella ? ?Epidermal / dermal shaving ? ?Lesion diameter (cm):  0.4 ?Informed consent: discussed and consent  obtained   ?Patient was prepped and draped in usual sterile fashion: area prepped with alcohol. ?Anesthesia: the lesion was anesthetized in a standard fashion   ?Anesthetic:  1% lidocaine w/ epinephrine 1-100,000 buffered w/ 8.4% NaHCO3 ?Instrument used: flexible razor blade   ?Hemostasis achieved with: pressure, aluminum chloride and electrodesiccation   ?Outcome: patient tolerated procedure well   ?Post-procedure details: wound care instructions given   ?Post-procedure details comment:  Ointment and small bandage applied ? ?Specimen 2 - Surgical pathology ?Differential Diagnosis: D48.5 Irritated Nevus vs other ? ?Check Margins: No ?4.026mflesh pap ? ?Discussed resulting small scar with shave removal, and possible recurrence of lesion.  Recommend vaseline ointment to area daily and cover until healed.  Recommend photoprotection/sunscreen to area to prevent discoloration of scar.  Once healed, may apply OTC Serica scar gel bid to thickened scars.  ? ?Nummular dermatitis ?L and R calf ? ?Chronic and persistent condition with duration or expected duration over one year. Condition is bothersome/symptomatic for patient. Currently flared.  ? ?start [place].1% cr qd/bid aa legs until itchy rash clears, then prn flares, avoid f/g/a, pt has ? ?RTC if not clearing ? ?Topical steroids (such as triamcinolone, fluocinolone, fluocinonide, mometasone, clobetasol, halobetasol, betamethasone, hydrocortisone) can cause thinning and lightening of the skin if they are used for too long in the same area. Your physician has selected the right strength medicine for your problem and area affected on the body. Please use your medication only as directed by your physician to prevent side effects.   ? ?Recommend mild soap and moisturizing cream 1-2 times daily.  Gentle skin care handout provided.   ? ?Seborrheic dermatitis ?Head - Anterior (Face) ? ?Chronic condition with duration or expected duration over one year. Currently well-controlled.   ? ?Seborrheic Dermatitis  ?-  is a chronic persistent rash characterized by pinkness and scaling most commonly of the mid face but also can occur on the scalp (dandruff), ears; mid chest, mid back and groin.  It tends to be exacerbated by stress and cooler weather.  People who have neurologic disease may experience new onset or exacerbation of existing seborrheic dermatitis.  The condition is not curable but treatable and can be controlled. ? ?Cont Ketoconazole 2% shampoo 3x/wk to scalp, let sit 5 minutes and rinse out ?Cont Ketoconazole 2% cr qd prn flares to eyebrows ?Can use 1%HC cream qd/bid prn itch ? ?ketoconazole (NIZORAL) 2 % shampoo - Head - Anterior (Face) ?Apply 1 application. topically 3 (three) times a week. Wash scalp 3 times weekly, let sit 5 minutes and rinse out ? ? ?Return in about 1 year (around 05/06/2022) for TBSE. ? ?I, Sonya Hupman, RMA, am acting as scribe for TaBrendolyn PattyMD . ? ?Documentation: I have reviewed the above documentation for accuracy and completeness, and I agree with the above. ? ?TaBrendolyn PattyD  ? ?

## 2021-05-05 NOTE — Patient Instructions (Addendum)
Wound Care Instructions ? ?Cleanse wound gently with soap and water once a day then pat dry with clean gauze. Apply a thing coat of Petrolatum (petroleum jelly, "Vaseline") over the wound (unless you have an allergy to this). We recommend that you use a new, sterile tube of Vaseline. Do not pick or remove scabs. Do not remove the yellow or white "healing tissue" from the base of the wound. ? ?Cover the wound with fresh, clean, nonstick gauze and secure with paper tape. You may use Band-Aids in place of gauze and tape if the would is small enough, but would recommend trimming much of the tape off as there is often too much. Sometimes Band-Aids can irritate the skin. ? ?You should call the office for your biopsy report after 1 week if you have not already been contacted. ? ?If you experience any problems, such as abnormal amounts of bleeding, swelling, significant bruising, significant pain, or evidence of infection, please call the office immediately. ? ?FOR ADULT SURGERY PATIENTS: If you need something for pain relief you may take 1 extra strength Tylenol (acetaminophen) AND 2 Ibuprofen ('200mg'$  each) together every 4 hours as needed for pain. (do not take these if you are allergic to them or if you have a reason you should not take them.) Typically, you may only need pain medication for 1 to 3 days.  ? ? ?Continue Triamcinolone cream 0.1% 2 times a day to itchy spots on right and left calf until resolved.  Avoid using on face, under arms, and in groin ? ?If You Need Anything After Your Visit ? ?If you have any questions or concerns for your doctor, please call our main line at [phone] and press option 4 to reach your doctor's medical assistant. If no one answers, please leave a voicemail as directed and we will return your call as soon as possible. Messages left after 4 pm will be answered the following business day.  ? ?You may also send [place] a message via MyChart. We typically respond to MyChart messages within  1-2 business days. ? ?For prescription refills, please ask your pharmacy to contact our office. Our fax number is [phone]. ? ?If you have an urgent issue when the clinic is closed that cannot wait until the next business day, you can page your doctor at the number below.   ? ?Please note that while we do our best to be available for urgent issues outside of office hours, we are not available 24/7.  ? ?If you have an urgent issue and are unable to reach [place], you may choose to seek medical care at your doctor's office, retail clinic, urgent care center, or emergency room. ? ?If you have a medical emergency, please immediately call 911 or go to the emergency department. ? ?Pager Numbers ? ?- Dr. Nehemiah Massed: [phone] ? ?- Dr. Laurence Ferrari: [phone] ? ?- Dr. Nicole Kindred: [phone] ? ?In the event of inclement weather, please call our main line at [phone] for an update on the status of any delays or closures. ? ?Dermatology Medication Tips: ?Please keep the boxes that topical medications come in in order to help keep track of the instructions about where and how to use these. Pharmacies typically print the medication instructions only on the boxes and not directly on the medication tubes.  ? ?If your medication is too expensive, please contact our office at [phone] option 4 or send [place] a message through [place].  ? ?We are unable to tell what your co-pay for  medications will be in advance as this is different depending on your insurance coverage. However, we may be able to find a substitute medication at lower cost or fill out paperwork to get insurance to cover a needed medication.  ? ?If a prior authorization is required to get your medication covered by your insurance company, please allow [place] 1-2 business days to complete this process. ? ?Drug prices often vary depending on where the prescription is filled and some pharmacies may offer cheaper prices. ? ?The website www.goodrx.com contains coupons for  medications through different pharmacies. The prices here do not account for what the cost may be with help from insurance (it may be cheaper with your insurance), but the website can give you the price if you did not use any insurance.  ?- You can print the associated coupon and take it with your prescription to the pharmacy.  ?- You may also stop by our office during regular business hours and pick up a GoodRx coupon card.  ?- If you need your prescription sent electronically to a different pharmacy, notify our office through [facility] or by phone at [phone] option 4. ? ? ? ? ?Si Usted Necesita Algo Despu?s de Su Visita ? ?Tambi?n puede enviarnos un mensaje a trav?s de MyChart. Por lo general respondemos a los mensajes de MyChart en el transcurso de 1 a 2 d?as h?biles. ? ?Para renovar recetas, por favor pida a su farmacia que se ponga en contacto con nuestra oficina. Nuestro n?mero de fax es el [phone]. ? ?Si tiene un asunto urgente cuando la cl?nica est? cerrada y que no puede esperar hasta el siguiente d?a h?bil, puede llamar/localizar a su doctor(a) al n?mero que aparece a continuaci?n.  ? ?Por favor, tenga en cuenta que aunque hacemos todo lo posible para estar disponibles para asuntos urgentes fuera del horario de oficina, no estamos disponibles las 24 horas del d?a, los 7 d?as de la semana.  ? ?Si tiene un problema urgente y no puede comunicarse con nosotros, puede optar por buscar atenci?n m?dica  en el consultorio de su doctor(a), en una cl?nica privada, en un centro de atenci?n urgente o en una sala de emergencias. ? ?Si tiene Engineer, maintenance (IT) m?dica, por favor llame inmediatamente al 911 o vaya a la sala de emergencias. ? ?N?meros de b?per ? ?- Dr. Nehemiah Massed: [phone] ? ?- Dra. Moye: [phone] ? ?- Dra. Nicole Kindred: [phone] ? ?En caso de inclemencias del tiempo, por favor llame a nuestra l?nea principal al [phone] para una actualizaci?n sobre el estado de cualquier retraso o  cierre. ? ?Consejos para la medicaci?n en dermatolog?a: ?Por favor, guarde las cajas en las que vienen los medicamentos de uso t?pico para ayudarle a seguir las instrucciones sobre d?nde y c?mo usarlos. Las farmacias generalmente imprimen las instrucciones del medicamento s?lo en las cajas y no directamente en los tubos del [place].  ? ?Si su medicamento es muy caro, por favor, p?ngase en contacto con Zigmund Daniel llamando al [phone] y presione la opci?n 4 o env?enos un mensaje a trav?s de MyChart.  ? ?No podemos decirle cu?l ser? su copago por los medicamentos por adelantado ya que esto es diferente dependiendo de la cobertura de su seguro. Sin embargo, es posible que podamos encontrar un medicamento sustituto a Electrical engineer un formulario para que el seguro cubra el medicamento que se considera necesario.  ? ?Si se requiere Ardelia Mems autorizaci?n previa para que su compa??a de seguros [place] su medicamento, por favor perm?tanos  de 1 a 2 d?as h?biles para completar este proceso. ? ?Los precios de los medicamentos var?an con frecuencia dependiendo del Environmental consultant de d?nde se surte la receta y alguna farmacias pueden ofrecer precios m?s baratos. ? ?El sitio web www.goodrx.com tiene cupones para medicamentos de Airline pilot. Los precios aqu? no tienen en cuenta lo que podr?a costar con la ayuda del seguro (puede ser m?s barato con su seguro), pero el sitio web puede darle el precio si no utiliz? ning?n seguro.  ?- Puede imprimir el cup?n correspondiente y llevarlo con su receta a la farmacia.  ?- Tambi?n puede pasar por nuestra oficina durante el horario de atenci?n regular y recoger una tarjeta de cupones de GoodRx.  ?- Si necesita que su receta se env?e electr?nicamente a Chiropodist, informe a nuestra oficina a trav?s de MyChart de [place] o por tel?fono llamando al [phone] y presione la opci?n 4.  ?

## 2021-05-07 ENCOUNTER — Telehealth: Payer: Self-pay

## 2021-05-07 NOTE — Telephone Encounter (Signed)
-----   Message from Brendolyn Patty, MD sent at 05/07/2021  1:41 PM EDT ----- ?1. Skin , left lat upper eyelid ?MELANOCYTIC NEVUS, INTRADERMAL TYPE, BASE INVOLVED ?2. Skin , glabella ?PALISADED ENCAPSULATED NEUROMA, BASE INVOLVED ? ?1. Benign mole ?2. Benign growth of nerve ? ? - please call patient ?

## 2021-05-07 NOTE — Telephone Encounter (Signed)
Advised pt of bx result/sh ?

## 2021-05-19 ENCOUNTER — Ambulatory Visit: Payer: Medicare HMO | Admitting: Internal Medicine

## 2021-05-20 ENCOUNTER — Ambulatory Visit: Payer: Medicare HMO | Admitting: Internal Medicine

## 2021-05-21 ENCOUNTER — Other Ambulatory Visit: Payer: Self-pay

## 2021-05-21 ENCOUNTER — Encounter: Payer: Self-pay | Admitting: Obstetrics & Gynecology

## 2021-05-21 ENCOUNTER — Ambulatory Visit: Payer: Medicare HMO | Admitting: Obstetrics & Gynecology

## 2021-05-21 VITALS — BP 124/68 | HR 76 | Ht 64.0 in | Wt 157.0 lb

## 2021-05-21 DIAGNOSIS — N814 Uterovaginal prolapse, unspecified: Secondary | ICD-10-CM | POA: Diagnosis not present

## 2021-05-21 NOTE — Patient Instructions (Signed)
This is the new practice information you requested: ? ?Dr. Clar[facility] ?[address] ?[address] ?[place], [place]  [address] ?[phone] ? ?Also, Dr Prentice Docker ?  [facility] ?  [phone] ? ? ?

## 2021-05-21 NOTE — Progress Notes (Signed)
?HPI: ?     [name] is a 88 y.o. [id number] who presents today for her pessary follow up and examination related to her pelvic floor weakening.  Pt reports tolerating the pessary well with no vaginal bleeding and no vaginal discharge.  Symptoms of pelvic floor weakening have greatly improved. She is voiding and defecating without difficulty. She currently has a Ring type pessary. ? 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 Also,  has a past surgical history that includes Tonsillectomy; Appendectomy; Cesarean section; Joint replacement (Right); Thyroidectomy; Colon surgery; Cataract extraction w/PHACO (Left, 03/25/2015); Adenoidectomy; biopsy, right breast ; Total hip arthroplasty; Thyroid lobectomy; and Upper gi endoscopy (10/17/2008)., family history includes Dementia in her maternal grandfather; Stroke in her father and mother.,  reports that she has quit smoking. She has never used smokeless tobacco. She reports that she does not drink alcohol and does not use drugs. ? ?She has a current medication list which includes the following prescription(s): acetaminophen, amlodipine, aspirin ec, atorvastatin, vitamin d3, famotidine, hydroxyurea, irbesartan, ketoconazole, levothyroxine, nystatin ointment, systane, and moderna covid-19 bival booster. Also, is allergic to novocain [procaine]. ? ?Review of Systems  ?All other systems reviewed and are negative. ? ?Objective: ?BP 124/68 (Cuff Size: Normal)   Pulse 76    Ht '5\' 4"'$  (1.626 m)   Wt 157 lb (71.2 kg)   BMI 26.95 kg/m?  ?Physical Exam ?Constitutional:   ?   General: She is not in acute distress. ?   Appearance: She is well-developed.  ?Genitourinary:  ?   Right Labia: No rash or tenderness. ?   Left Labia: No tenderness or rash. ?   No vaginal erythema or bleeding.  ?   Moderate vaginal atrophy present. ? ?   Right Adnexa: not tender and no mass present. ?   Left Adnexa: not tender and no mass present. ?   No cervical motion tenderness, discharge, polyp or nabothian cyst.  ?   Uterus is prolapsed.  ?   Uterus is not enlarged.  ?   No uterine mass detected. ?   Pelvic exam was performed with patient in the lithotomy position.  ?HENT:  ?   Head: Normocephalic and atraumatic.  ?   Nose: Nose normal.  ?Abdominal:  ?   General: There is no distension.  ?   Palpations: Abdomen is soft.  ?   Tenderness: There is no abdominal tenderness.  ?Musculoskeletal:     ?   General: Normal range of motion.  ?Neurological:  ?   Mental Status: She is alert and oriented to person, place, and time.  ?   Cranial Nerves: No cranial nerve deficit.  ?Skin: ?   General: Skin is warm and dry.  ?Psychiatric:     ?   Attention and Perception: Attention normal.     ?   Mood and Affect: Mood and affect normal.     ?   Speech: Speech normal.     ?   Behavior: Behavior normal.     ?   Thought Content: Thought content normal.     ?   Judgment: Judgment normal.  ? ? ?Pessary Care ?Pessary removed and cleaned.  Vagina checked - without erosions - pessary replaced. ? ?A/P: ?  ICD-10-CM   ?1. Uterine prolapse  N81.4   ?  ?2. Cystocele with prolapse  N81.4   ?  ? ?Pessary was cleaned and replaced today. ?Instructions given for care. ?Concerning symptoms to observe for are counseled to patient. ?Follow up scheduled for 3 months. ? ?A total of 20 minutes were spent face-to-face with the patient as well as preparation, review, communication, and documentation during this encounter.  ? ?Barnett Applebaum, MD,  [place] ?[facility], [place] ?05/21/2021  11:08 AM ? ?

## 2021-05-26 ENCOUNTER — Other Ambulatory Visit: Payer: Self-pay | Admitting: Oncology

## 2021-05-26 DIAGNOSIS — D473 Essential (hemorrhagic) thrombocythemia: Secondary | ICD-10-CM

## 2021-05-26 DIAGNOSIS — D75839 Thrombocytosis, unspecified: Secondary | ICD-10-CM

## 2021-05-26 NOTE — Telephone Encounter (Signed)
CBC with Differential ?Order: [id number] ?Status: Final result    ?Visible to patient: No (inaccessible in MyChart)    ?Next appt: 06/20/2021 at 10:20 AM in Internal Medicine Teodora Medici, DO)    ?Dx: Essential thrombocytosis ([place])    ?0 Result Notes ?          ?Component Ref Range & Units 1 mo ago ?(04/23/21) 4 mo ago ?(01/15/21) 7 mo ago ?(10/14/20) 9 mo ago ?(08/25/20) 10 mo ago ?(07/15/20) 1 yr ago ?(04/15/20) 1 yr ago ?(03/20/20)  ?WBC 4.0 - 10.5 K/uL 9.6  15.6 High   8.2  8.4  9.8  8.6  8.1 R   ?RBC 3.87 - 5.11 MIL/uL 4.59  4.31  4.25  4.55  4.26  4.24  4.57 R   ?Hemoglobin 12.0 - 15.0 g/dL 14.5  13.7  13.5  14.5  13.6  13.4  14.5 R   ?HCT 36.0 - 46.0 % 43.5  40.8  40.4  41.4  40.5  40.4  41.6 R   ?MCV 80.0 - 100.0 fL 94.8  94.7  95.1  91.0  95.1  95.3  91.0   ?MCH 26.0 - 34.0 pg 31.6  31.8  31.8  31.9  31.9  31.6  31.7 R   ?MCHC 30.0 - 36.0 g/dL 33.3  33.6  33.4  35.0  33.6  33.2  34.9 R   ?RDW 11.5 - 15.5 % 14.5  13.7  14.0  13.4  13.9  14.2  13.8 R   ?Platelets 150 - 400 K/uL 450 High   458 High   439 High   459 High   448 High   390  448 High  R   ?nRBC 0.0 - 0.2 % 0.0  0.0  0.0  0.0 CM  0.0  0.0    ?Neutrophils Relative % % 75  82  69   76  71  73.2   ?Neutro Abs 1.7 - 7.7 K/uL 7.2  12.7 High   5.7   7.6  6.2  5,929 R   ?Lymphocytes Relative % '15  8  17   14  17    '$ ?Lymphs Abs 0.7 - 4.0 K/uL 1.4  1.2  1.4   1.4  1.4  1,377 R   ?Monocytes Relative % '8  10  11   7  8  '$ 8.0   ?Monocytes Absolute 0.1 - 1.0 K/uL 0.8  1.5 High   0.9   0.7  0.7    ?Eosinophils Relative % 2  0  '1   1  2  '$ 1.4   ?Eosinophils Absolute 0.0 - 0.5 K/uL 0.2  0.1  0.1   0.1  0.1  113 R   ?Basophils Relative % 0  0  '1   1  1  '$ 0.4   ?Basophils Absolute 0.0 - 0.1 K/uL 0.0  0.1  0.0   0.1  0.1  32 R   ?Immature Granulocytes % 0  0  '1   1  1    '$ ?Abs Immature Granulocytes 0.00 - 0.07 K/uL 0.04  0.07 CM  0.05 CM   0.05 CM  0.06 CM    ?Comment: Performed at [facility], [address], [place], [place] [address]  ?MPV        9.9 R    ?Absolute Monocytes        648 R   ?Total Lymphocyte        17.0 R   ?Resulting Agency  [place] [facility] [place] [facility] [place] [facility] [place] [facility] [place] [facility] [place] [facility]  ?  ? ?  ?  ?Specimen Collected: 04/23/21 10:31 Last Resulted: 04/23/21 10:42  ?  ?  Lab Flowsheet   ? Order Details   ? View Encounter   ? Lab and Collection Details   ? Routing   ? Result History    ?View Encounter Conversation    ?  ?CM=Additional comments  R=Reference range differs from displayed range    ?  ?Result Care Coordination ? ? ?Patient Communication ? ? Add Comments   Not seen Back to Top  ?  ?  ? ?Other Results from 04/23/2021 ? ? Contains abnormal data Comprehensive metabolic panel ?Order: [id number] ?Status: Final result    ?Visible to patient: No (inaccessible in MyChart)    ?Next appt: 06/20/2021 at 10:20 AM in Internal Medicine Teodora Medici, DO)    ?Dx: Essential thrombocytosis ([place])    ?0 Result Notes ?          ?Component Ref Range & Units 1 mo ago ?(04/23/21) 4 mo ago ?(01/15/21) 7 mo ago ?(10/14/20) 9 mo ago ?(08/25/20) 9 mo ago ?(08/12/20) 1 yr ago ?(04/15/20) 1 yr ago ?(03/20/20)  ?Sodium 135 - 145 mmol/L 135  132 Low   134 Low   126 Low   135 R  138  138 R   ?Potassium 3.5 - 5.1 mmol/L 4.5  4.6  4.5  4.4  5.2 R  4.3  4.9 R   ?Chloride 98 - 111 mmol/L 103  96 Low   99  94 Low   103 R  104  104 R   ?CO2 22 - 32 mmol/L '25  25  27  23  23 '$ R  25  24 R   ?Glucose, Bld 70 - 99 mg/dL 123 High   109 High  CM  115 High  CM  155 High  CM  95 R, CM  130 High  CM  98 R, CM   ?Comment: Glucose reference range applies only to samples taken after fasting for at least 8 hours.  ?BUN 8 - 23 mg/dL 30 High   27 High   27 High   26 High   27 High  R  31 High   29 High  R   ?Creatinine, Ser 0.44 - 1.00 mg/dL 1.23 High   1.10 High   1.15 High   1.11 High   1.25 High  R, CM  1.13 High   1.16 High  R, CM   ?Calcium 8.9 - 10.3 mg/dL 9.4  9.3  9.2  9.7  9.7 R  9.1  10.0 R   ?Total Protein 6.5 - 8.1 g/dL 7.6  7.8  7.7   7.6 R  7.2  7.2 R   ?Albumin 3.5 -  5.0 g/dL 4.3  4.6  4.3    4.1    ?AST 15 - 41 U/L '22  20  19   17 '$ R  20  18 R   ?ALT 0 - 44 U/L '16  14  16   12 '$ R  15  12 R   ?Alkaline Phosphatase 38 - 126 U/L 88  82  85    68    ?Total Bilirubin 0.3 - 1.2 mg/dL 0.9  1.4 High   1.4 High    1.0 R  1.2  1.3 High  R   ?GFR, Estimated >60 mL/min 41  Low   47 Low  CM  45 Low  CM  47 Low  CM   46 Low  CM    ?Comment: (NOTE)  ?Calculated using the CKD-EPI Creatinine Equation (2021)   ?Anion gap 5 - '15 7  11 '$ CM  8 CM  9 CM   9 CM    ?Comment: Performed at [facility], [address] [place], [place] [address]  ?Resulting Agency  [place] [facility] [place] [facility] [place] [facility] [place] [facility] [place] [facility]  ?  ? ?  ?  ?Specimen Collected: 04/23/21 10:31 Last Resulted: 04/23/21 11:02  ?  ?  ? ?

## 2021-05-28 DIAGNOSIS — Z1231 Encounter for screening mammogram for malignant neoplasm of breast: Secondary | ICD-10-CM | POA: Diagnosis not present

## 2021-06-20 ENCOUNTER — Ambulatory Visit (INDEPENDENT_AMBULATORY_CARE_PROVIDER_SITE_OTHER): Payer: Medicare HMO | Admitting: Internal Medicine

## 2021-06-20 ENCOUNTER — Ambulatory Visit: Payer: Medicare HMO | Admitting: Internal Medicine

## 2021-06-20 ENCOUNTER — Encounter: Payer: Self-pay | Admitting: Internal Medicine

## 2021-06-20 VITALS — BP 122/62 | HR 87 | Temp 97.5°F | Resp 16 | Ht 63.0 in | Wt 154.4 lb

## 2021-06-20 DIAGNOSIS — Z7189 Other specified counseling: Secondary | ICD-10-CM | POA: Diagnosis not present

## 2021-06-20 DIAGNOSIS — E782 Mixed hyperlipidemia: Secondary | ICD-10-CM | POA: Diagnosis not present

## 2021-06-20 DIAGNOSIS — R7303 Prediabetes: Secondary | ICD-10-CM | POA: Diagnosis not present

## 2021-06-20 DIAGNOSIS — I10 Essential (primary) hypertension: Secondary | ICD-10-CM

## 2021-06-20 DIAGNOSIS — D473 Essential (hemorrhagic) thrombocythemia: Secondary | ICD-10-CM | POA: Diagnosis not present

## 2021-06-20 DIAGNOSIS — E890 Postprocedural hypothyroidism: Secondary | ICD-10-CM | POA: Diagnosis not present

## 2021-06-20 DIAGNOSIS — N1832 Chronic kidney disease, stage 3b: Secondary | ICD-10-CM | POA: Diagnosis not present

## 2021-06-20 DIAGNOSIS — K219 Gastro-esophageal reflux disease without esophagitis: Secondary | ICD-10-CM | POA: Diagnosis not present

## 2021-06-20 MED ORDER — AMLODIPINE BESYLATE 5 MG PO TABS: 5.0000 mg | ORAL_TABLET | Freq: Every day | ORAL | 0 refills | Status: DC

## 2021-06-20 MED ORDER — ATORVASTATIN CALCIUM 20 MG PO TABS: 20.0000 mg | ORAL_TABLET | Freq: Every day | ORAL | 0 refills | Status: DC

## 2021-06-20 MED ORDER — IRBESARTAN 150 MG PO TABS: 150.0000 mg | ORAL_TABLET | Freq: Every day | ORAL | 3 refills | Status: DC

## 2021-06-20 NOTE — Assessment & Plan Note (Signed)
Last A1c 5.9% in 7/22.  Plan to recheck today.  Not currently on medications, lifestyle controlled. ?

## 2021-06-20 NOTE — Assessment & Plan Note (Signed)
Blood pressure stable today.  Continue current regimen which consists of irbesartan 150 mg daily, amlodipine 5 mg daily.  These medications were refilled today. ?

## 2021-06-20 NOTE — Progress Notes (Signed)
? ?Established Patient Office Visit ? ?Subjective:  ?Patient ID: Gina [name], female    DOB: 05/28/1926  Age: 88 y.o. MRN: [id number] ? ?CC:  ?Chief Complaint  ?Patient presents with  ? Follow-up  ? Hypertension  ? Hyperlipidemia  ? Hypothyroidism  ? ? ?HPI ?Gina [name] presents for follow up on chronic medical conditions.  Does reside at [facility], requiring paperwork about today's visit, DNR order and a letter stating that she cannot consume alcohol reasonably and that it will not interact with her medications.  Discussed DNR order, the patient is aware of what this entails and she does not wish to have any potentially life-saving interventions performed from time forward. ? ?Hypertension: ?-Medications: Irbesartan 150, Amlodipine 5 ?-Patient is compliant with above medications and reports no side effects. ?-Checking BP at home (average): doing well 110-120/60-70 ?-Denies any SOB, CP, vision changes, LE edema or symptoms of hypotension ? ?HLD: ?-Medications: Lipitor 20 ?-Patient is compliant with above medications and reports no side effects.  ?-Last lipid panel: 1/22: Lipid Panel  ?   ?Component Value Date/Time  ? CHOL 166 03/20/2020 1156  ? CHOL 170 09/02/2015 0859  ? TRIG 134 03/20/2020 1156  ? HDL 55 03/20/2020 1156  ? HDL 55 09/02/2015 0859  ? CHOLHDL 3.0 03/20/2020 1156  ? VLDL 31 (H) 08/27/2016 0816  ? [place] 88 03/20/2020 1156  ? LABVLDL 35 09/02/2015 0859  ? ?Pre-Diabetes: ?-Last A1c in 7/22 5.9% ? ?Hypothyroidism: ?-Medications: Recently had to switch from Synthroid to Levothyroxine 88 mcg because of insurance coverage, plan to recheck thyroid today to confirm dose ?-Patient is compliant with the above medication (s) at the above dose and reports no medication side effects.  ?-Denies weight changes, skin changes, anxiety/palpitations.  Does have some cold intolerance mildly. ?-Last TSH: 6/22 1.54 ? ?GERD: ?-Currently on Pepcid 20 mg daily, doing well  ?-Denies abdominal pain, abdominal  distention, nausea, vomiting.  Appetite good, weight stable. ? ?CKD3b: ?-Last Cr 1.23, GFR 41 from 3/23 ?-Follows with Nephrology, note reviewed from 04/22/2021, plan to follow-up with them in another 6 months ? ?Essential thrombocytosis: ?-Following with oncology, note from 04/23/2021 reviewed -following with them every 3 months ?-Currently on Hydrea regiment 4 days a week ?-Last platelet count in March, 2023 mildly elevated at 450 ? ?Health Maintenance: ?-Gynecology every 4 months for pessary, mammogram  ?-Blood work due  ? ?Past Medical History:  ?Diagnosis Date  ? Breast mass in female   ? right breast  ? Cataract   ? Difficult intubation 03/03/2016  ? September 06, 2013; left nasal fiberoptic intubation #7 ETT; see letter from Dr. Loanne Drilling, Dept of Anesthesiology, [facility]  ? Diverticulitis   ? Diverticulosis 05/28/2015  ? Essential thrombocytosis ([place]) 07/23/2015  ? Genital prolapse   ? GERD (gastroesophageal reflux disease)   ? 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 Confirmed by Dr. Mike Gip  ? Hemorrhoids 07/29/2016  ? Hyperlipemia   ? Hyperlipidemia   ? history of   ? Hypothyroidism   ? Mild atherosclerosis of carotid artery 06/25/2015  ? Morton's neuroma   ? MRI contraindicated due to metal implant   ? Prolapse of uterus   ? Radiculopathy   ? Renal cyst   ? Rosacea   ? Shingles   ? Thrombocytosis   ? ? ?Past Surgical History:  ?Procedure Laterality Date  ? ADENOIDECTOMY    ? APPENDECTOMY    ? biopsy, right breast     ? CATARACT EXTRACTION W/PHACO Left 03/25/2015  ?  Procedure: CATARACT EXTRACTION PHACO AND INTRAOCULAR LENS PLACEMENT (IOC);  Surgeon: Estill Cotta, MD;  Location: [facility] ORS;  Service: Ophthalmology;  Laterality: Left;  [place] 02:09 ?AP% 27.3 ?CDE 59.97 ?fluid pack lot #[id number]  ? CESAREAN SECTION    ? COLON SURGERY    ? JOINT REPLACEMENT Right   ? total hip  ? THYROID LOBECTOMY    ? THYROIDECTOMY    ? partial  ? TONSILLECTOMY    ? TOTAL HIP ARTHROPLASTY    ? UPPER GI ENDOSCOPY  10/17/2008  ? ? ?Family History  ?Problem  Relation Age of Onset  ? Stroke Mother   ? Stroke Father   ? Dementia Maternal Grandfather   ? ? ?Social History  ? ?Socioeconomic History  ? Marital status: Widowed  ?  Spouse name: Not on file  ? Number of children: 2  ? Years of education: Not on file  ? Highest education level: Not on file  ?Occupational History  ? Occupation: retired   ?Tobacco Use  ? Smoking status: Former  ? Smokeless tobacco: Never  ?Vaping Use  ? Vaping Use: Never used  ?Substance and Sexual Activity  ? Alcohol use: No  ?  Alcohol/week: 0.0 standard drinks  ? Drug use: No  ? Sexual activity: Not Currently  ?Other Topics Concern  ? Not on file  ?Social History Narrative  ? Lost her husband May 5 th, 2021  ? She does not drive  ? She has two sons that live in town  ? ?Social Determinants of Health  ? ?Financial Resource Strain: Not on file  ?Food Insecurity: Not on file  ?Transportation Needs: Not on file  ?Physical Activity: Not on file  ?Stress: Not on file  ?Social Connections: Not on file  ?Intimate Partner Violence: Not on file  ? ? ?Outpatient Medications Prior to Visit  ?Medication Sig Dispense Refill  ? Acetaminophen 500 MG coapsule Take by mouth every 4 (four) hours as needed for fever.    ? amLODipine (NORVASC) 5 MG tablet Take 1 tablet by mouth once daily 90 tablet 0  ? aspirin EC 81 MG tablet Take 81 mg by mouth daily.     ? atorvastatin (LIPITOR) 20 MG tablet TAKE 1 TABLET BY MOUTH AT BEDTIME 90 tablet 0  ? Cholecalciferol (VITAMIN D3) 1000 units CAPS Take by mouth daily.     ? famotidine (PEPCID) 20 MG tablet Take one tab once to twice daily as needed 60 tablet 5  ? hydroxyurea (HYDREA) 500 MG capsule TAKE 1 CAPSULE BY MOUTH 4 DAYS PER WEEK 60 capsule 0  ? irbesartan (AVAPRO) 150 MG tablet Take 1 tablet (150 mg total) by mouth daily. 90 tablet 3  ? ketoconazole (NIZORAL) 2 % shampoo Apply 1 application. topically 3 (three) times a week. Wash scalp 3 times weekly, let sit 5 minutes and rinse out 120 mL 11  ? levothyroxine  (SYNTHROID) 88 MCG tablet TAKE 1 TABLET BY MOUTH ONCE DAILY BEFORE BREAKFAST 90 tablet 3  ? MODERNA COVID-19 BIVAL BOOSTER 50 MCG/0.5ML injection     ? nystatin ointment (MYCOSTATIN) Apply 1 application topically 2 (two) times daily. 30 g 0  ? Polyethyl Glycol-Propyl Glycol (SYSTANE) 0.4-0.3 % SOLN Apply to eye.    ? ?No facility-administered medications prior to visit.  ? ? ?Allergies  ?Allergen Reactions  ? Novocain [Procaine] Palpitations  ? ? ?ROS ?Review of Systems  ?Constitutional:  Negative for appetite change, chills and fever.  ?HENT:  Negative for sore throat.   ?  Eyes:  Negative for visual disturbance.  ?Respiratory:  Negative for shortness of breath.   ?Cardiovascular:  Negative for chest pain.  ?Gastrointestinal:  Negative for abdominal distention, abdominal pain, nausea and vomiting.  ?Neurological:  Negative for dizziness and headaches.  ? ?  ?Objective:  ?  ?Physical Exam ?Constitutional:   ?   Appearance: Normal appearance.  ?   Comments: Uses walker   ?HENT:  ?   Head: Normocephalic and atraumatic.  ?   Mouth/Throat:  ?   Mouth: Mucous membranes are moist.  ?   Pharynx: Oropharynx is clear.  ?   Comments: PND present ?Eyes:  ?   Conjunctiva/sclera: Conjunctivae normal.  ?Cardiovascular:  ?   Rate and Rhythm: Normal rate and regular rhythm.  ?Pulmonary:  ?   Effort: Pulmonary effort is normal.  ?   Breath sounds: Normal breath sounds.  ?Musculoskeletal:  ?   Right lower leg: No edema.  ?   Left lower leg: No edema.  ?   Comments: Mild chronic BLE non-pitting edema  ?Skin: ?   General: Skin is warm and dry.  ?Neurological:  ?   General: No focal deficit present.  ?   Mental Status: She is alert. Mental status is at baseline.  ?Psychiatric:     ?   Mood and Affect: Mood normal.     ?   Behavior: Behavior normal.  ? ? ?BP 122/62   Pulse 87   Temp (!) 97.5 ?F (36.4 ?C)   Resp 16   Ht '5\' 3"'$  (1.6 m)   Wt 154 lb 6.4 oz (70 kg)   SpO2 96%   BMI 27.35 kg/m?  ?Wt Readings from Last 3 Encounters:   ?05/21/21 157 lb (71.2 kg)  ?04/23/21 157 lb 4.8 oz (71.4 kg)  ?02/21/21 153 lb (69.4 kg)  ? ? ? ?Health Maintenance Due  ?Topic Date Due  ? Zoster Vaccines- Shingrix (1 of 2) Never done  ? DEXA SCAN  Never done  ? COVID-1

## 2021-06-20 NOTE — Assessment & Plan Note (Signed)
Recently had to switch from Synthroid to levothyroxine 88 mcg daily due to insurance coverage.  Due for recheck of TSH today.  Will change dose of levothyroxine per results. ?

## 2021-06-20 NOTE — Patient Instructions (Addendum)
It was great seeing you today! ? ?Plan discussed at today's visit: ?-Blood work ordered today, results will be uploaded to [place].  ?-Medications refilled  ?-Letter given today, DNR order completed  ? ?Follow up in: 6 months  ? ?Take care and let us know if you have any questions or concerns prior to your next visit. ? ?Dr. Rosana Berger ? ?

## 2021-06-20 NOTE — Assessment & Plan Note (Signed)
Stable on Lipitor 20 mg daily.  Recheck lipid panel today. ?

## 2021-06-20 NOTE — Assessment & Plan Note (Signed)
Stable.  Followed by Dr.Singh at [facility].  Lab results reviewed from 3/23. ?

## 2021-06-20 NOTE — Assessment & Plan Note (Addendum)
Stable.  Following with Dr. Grayland Ormond, continue on hydroxyurea 500 mg 4 days a week.  Note from 04/23/2021 reviewed as well as blood work from March 2023 as well. ?

## 2021-06-20 NOTE — Assessment & Plan Note (Signed)
Stable, continue Pepcid 20 mg daily. ?

## 2021-06-21 LAB — HEMOGLOBIN A1C
Hgb A1c MFr Bld: 6.2 % of total Hgb — ABNORMAL HIGH (ref <5.7)
Mean Plasma Glucose: 131 mg/dL
eAG (mmol/L): 7.3 mmol/L

## 2021-06-21 LAB — LIPID PANEL
Cholesterol: 155 mg/dL (ref <200)
HDL: 52 mg/dL (ref >=50)
LDL Cholesterol (Calc): 77 mg/dL (calc)
Non-HDL Cholesterol (Calc): 103 mg/dL (calc) (ref <130)
Total CHOL/HDL Ratio: 3 (calc) (ref <5.0)
Triglycerides: 160 mg/dL — ABNORMAL HIGH (ref <150)

## 2021-06-21 LAB — TSH: TSH: 2.67 mIU/L (ref 0.40–4.50)

## 2021-07-24 ENCOUNTER — Other Ambulatory Visit: Payer: Medicare HMO

## 2021-07-25 ENCOUNTER — Other Ambulatory Visit: Payer: Self-pay

## 2021-07-25 ENCOUNTER — Inpatient Hospital Stay: Payer: Medicare HMO | Attending: Oncology

## 2021-07-25 DIAGNOSIS — D473 Essential (hemorrhagic) thrombocythemia: Secondary | ICD-10-CM | POA: Diagnosis not present

## 2021-07-25 LAB — CBC WITH DIFFERENTIAL/PLATELET
Abs Immature Granulocytes: 0.04 10*3/uL (ref 0.00–0.07)
Basophils Absolute: 0.1 10*3/uL (ref 0.0–0.1)
Basophils Relative: 1 %
Eosinophils Absolute: 0.1 10*3/uL (ref 0.0–0.5)
Eosinophils Relative: 1 %
HCT: 42.8 % (ref 36.0–46.0)
Hemoglobin: 14.5 g/dL (ref 12.0–15.0)
Immature Granulocytes: 0 %
Lymphocytes Relative: 15 %
Lymphs Abs: 1.6 10*3/uL (ref 0.7–4.0)
MCH: 31.9 pg (ref 26.0–34.0)
MCHC: 33.9 g/dL (ref 30.0–36.0)
MCV: 94.1 fL (ref 80.0–100.0)
Monocytes Absolute: 1 10*3/uL (ref 0.1–1.0)
Monocytes Relative: 9 %
Neutro Abs: 7.8 10*3/uL — ABNORMAL HIGH (ref 1.7–7.7)
Neutrophils Relative %: 74 %
Platelets: 484 10*3/uL — ABNORMAL HIGH (ref 150–400)
RBC: 4.55 MIL/uL (ref 3.87–5.11)
RDW: 14.3 % (ref 11.5–15.5)
WBC: 10.5 10*3/uL (ref 4.0–10.5)
nRBC: 0 % (ref 0.0–0.2)

## 2021-07-25 LAB — COMPREHENSIVE METABOLIC PANEL
ALT: 16 U/L (ref 0–44)
AST: 22 U/L (ref 15–41)
Albumin: 4.4 g/dL (ref 3.5–5.0)
Alkaline Phosphatase: 89 U/L (ref 38–126)
Anion gap: 9 (ref 5–15)
BUN: 30 mg/dL — ABNORMAL HIGH (ref 8–23)
CO2: 25 mmol/L (ref 22–32)
Calcium: 9.3 mg/dL (ref 8.9–10.3)
Chloride: 100 mmol/L (ref 98–111)
Creatinine, Ser: 1.12 mg/dL — ABNORMAL HIGH (ref 0.44–1.00)
GFR, Estimated: 46 mL/min — ABNORMAL LOW (ref >60)
Glucose, Bld: 114 mg/dL — ABNORMAL HIGH (ref 70–99)
Potassium: 4.8 mmol/L (ref 3.5–5.1)
Sodium: 134 mmol/L — ABNORMAL LOW (ref 135–145)
Total Bilirubin: 1.3 mg/dL — ABNORMAL HIGH (ref 0.3–1.2)
Total Protein: 8 g/dL (ref 6.5–8.1)

## 2021-08-12 ENCOUNTER — Telehealth: Payer: Self-pay | Admitting: Obstetrics

## 2021-08-12 NOTE — Telephone Encounter (Signed)
Called pt to schedule pessary maintenance with Dr. Sharlet Salina.  Left message for pt to call back to schedule.

## 2021-09-01 ENCOUNTER — Other Ambulatory Visit: Payer: Self-pay | Admitting: Oncology

## 2021-09-01 DIAGNOSIS — D473 Essential (hemorrhagic) thrombocythemia: Secondary | ICD-10-CM

## 2021-09-01 DIAGNOSIS — D75839 Thrombocytosis, unspecified: Secondary | ICD-10-CM

## 2021-09-02 NOTE — Telephone Encounter (Signed)
CBC with Differential/Platelet Order: [id number] Status: Final result    Visible to patient: No (inaccessible in MyChart)    Next appt: 10/29/2021 at 10:30 AM in Oncology ([facility])    Dx: Essential thrombocytosis ([place])    0 Result Notes           Component Ref Range & Units 1 mo ago (07/25/21) 4 mo ago (04/23/21) 7 mo ago (01/15/21) 10 mo ago (10/14/20) 1 yr ago (08/25/20) 1 yr ago (07/15/20) 1 yr ago (04/15/20)  WBC 4.0 - 10.5 K/uL 10.5  9.6  15.6 High   8.2  8.4  9.8  8.6   RBC 3.87 - 5.11 MIL/uL 4.55  4.59  4.31  4.25  4.55  4.26  4.24   Hemoglobin 12.0 - 15.0 g/dL 14.5  14.5  13.7  13.5  14.5  13.6  13.4   HCT 36.0 - 46.0 % 42.8  43.5  40.8  40.4  41.4  40.5  40.4   MCV 80.0 - 100.0 fL 94.1  94.8  94.7  95.1  91.0  95.1  95.3   MCH 26.0 - 34.0 pg 31.9  31.6  31.8  31.8  31.9  31.9  31.6   MCHC 30.0 - 36.0 g/dL 33.9  33.3  33.6  33.4  35.0  33.6  33.2   RDW 11.5 - 15.5 % 14.3  14.5  13.7  14.0  13.4  13.9  14.2   Platelets 150 - 400 K/uL 484 High   450 High   458 High   439 High   459 High   448 High   390   nRBC 0.0 - 0.2 % 0.0  0.0  0.0  0.0  0.0 CM  0.0  0.0   Neutrophils Relative % % 74  75  82  69   76  71   Neutro Abs 1.7 - 7.7 K/uL 7.8 High   7.2  12.7 High   5.7   7.6  6.2   Lymphocytes Relative % '15  15  8  17   14  17   '$ Lymphs Abs 0.7 - 4.0 K/uL 1.6  1.4  1.2  1.4   1.4  1.4   Monocytes Relative % '9  8  10  11   7  8   '$ Monocytes Absolute 0.1 - 1.0 K/uL 1.0  0.8  1.5 High   0.9   0.7  0.7   Eosinophils Relative % 1  2  0  '1   1  2   '$ Eosinophils Absolute 0.0 - 0.5 K/uL 0.1  0.2  0.1  0.1   0.1  0.1   Basophils Relative % 1  0  0  '1   1  1   '$ Basophils Absolute 0.0 - 0.1 K/uL 0.1  0.0  0.1  0.0   0.1  0.1   Immature Granulocytes % 0  0  0  '1   1  1   '$ Abs Immature Granulocytes 0.00 - 0.07 K/uL 0.04  0.04 CM  0.07 CM  0.05 CM   0.05 CM  0.06 CM   Comment: Performed at [facility], [address] [place], [place] [address]  Resulting Agency  [facility] [place] [facility] [place]  [facility] [place] [facility] [place] [facility] [place] [facility] [place] [facility]         Specimen Collected: 07/25/21 10:31 Last Resulted: 07/25/21 10:57      Lab Flowsheet  Order Details    View Encounter    Lab and Collection Details    Routing    Result History    View All Conversations on this Encounter      CM=Additional comments      Result Care Coordination   Patient Communication   Add Comments   Not seen Back to Top       Other Results from 07/25/2021   Contains abnormal data Comprehensive metabolic panel Order: [id number] Status: Final result    Visible to patient: No (inaccessible in [place])    Next appt: 10/29/2021 at 10:30 AM in Oncology ([facility])    Dx: Essential thrombocytosis ([place])    0 Result Notes           Component Ref Range & Units 1 mo ago (07/25/21) 4 mo ago (04/23/21) 7 mo ago (01/15/21) 10 mo ago (10/14/20) 1 yr ago (08/25/20) 1 yr ago (08/12/20) 1 yr ago (04/15/20)  Sodium 135 - 145 mmol/L 134 Low   135  132 Low   134 Low   126 Low   135 R  138   Potassium 3.5 - 5.1 mmol/L 4.8  4.5  4.6  4.5  4.4  5.2 R  4.3   Chloride 98 - 111 mmol/L 100  103  96 Low   99  94 Low   103 R  104   CO2 22 - 32 mmol/L '25  25  25  27  23  23 '$ R  25   Glucose, Bld 70 - 99 mg/dL 114 High   123 High  CM  109 High  CM  115 High  CM  155 High  CM  95 R, CM  130 High  CM   Comment: Glucose reference range applies only to samples taken after fasting for at least 8 hours.  BUN 8 - 23 mg/dL 30 High   30 High   27 High   27 High   26 High   27 High  R  31 High    Creatinine, Ser 0.44 - 1.00 mg/dL 1.12 High   1.23 High   1.10 High   1.15 High   1.11 High   1.25 High  R, CM  1.13 High    Calcium 8.9 - 10.3 mg/dL 9.3  9.4  9.3  9.2  9.7  9.7 R  9.1   Total Protein 6.5 - 8.1 g/dL 8.0  7.6  7.8  7.7   7.6 R  7.2   Albumin 3.5 - 5.0 g/dL 4.4  4.3  4.6  4.3    4.1   AST 15 - 41 U/L '22  22  20  19   17 '$ R  20   ALT 0 - 44 U/L '16  16  14  16   12 '$ R  15   Alkaline Phosphatase 38 - 126 U/L 89  88  82  85     68   Total Bilirubin 0.3 - 1.2 mg/dL 1.3 High   0.9  1.4 High   1.4 High    1.0 R  1.2   GFR, Estimated >60 mL/min 46 Low   41 Low  CM  47 Low  CM  45 Low  CM  47 Low  CM   46 Low  CM   Comment: (NOTE)  Calculated using the CKD-EPI Creatinine Equation (2021)   Anion gap 5 - '15 9  7 '$ CM  11 CM  8 CM  9 CM   9 CM   Comment: Performed at [facility], [address] [place], [place] [address]  [place]  [facility] [place] [facility] [place] [facility] [place] [facility] [place] [facility] [place] [facility]         Specimen Collected: 07/25/21 10:31 Last Resulted: 07/25/21 11:30

## 2021-09-07 IMAGING — CT CT RENAL STONE PROTOCOL
2 of 4 series · 16 of 46 positions shown, 18 images · non-contrast
Comparison: Prior CT scan 12/29/2014

CLINICAL DATA: Flank pain, kidney stone suspected

EXAM:
CT ABDOMEN AND PELVIS WITHOUT CONTRAST
TECHNIQUE: Multidetector CT imaging of the abdomen and pelvis was performed
following the standard protocol without IV contrast.

[Series 2: stone full standard (person_name) · axial · 0.79mm/px · z∈[-1276,-856]mm · 13 of 93 slices shown, 15 images]
[im 5/93  soft-tissue]
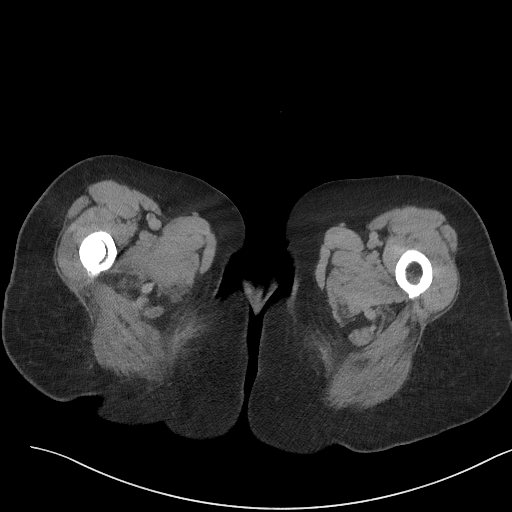
[im 5/93  bone]
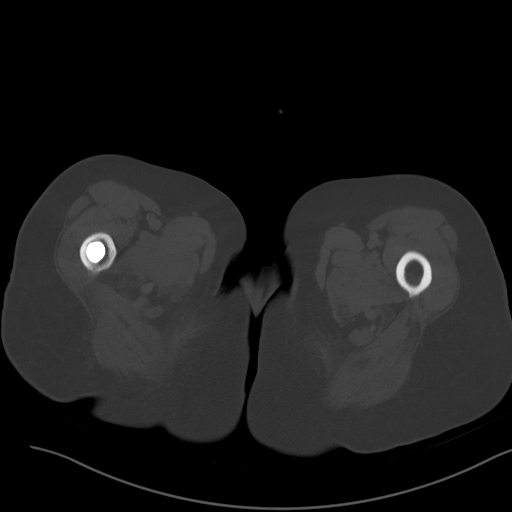
[im 13/93  soft-tissue]
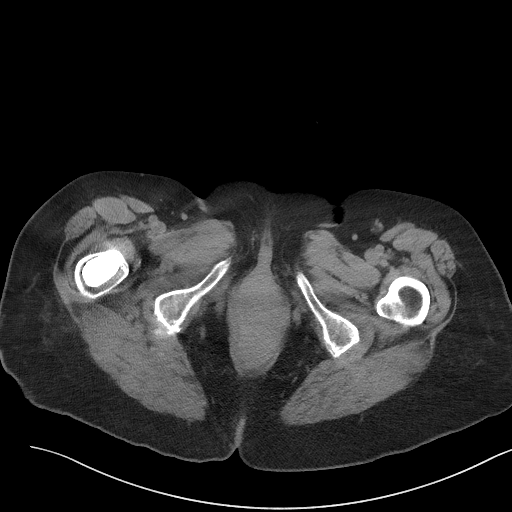
[im 21/93  soft-tissue]
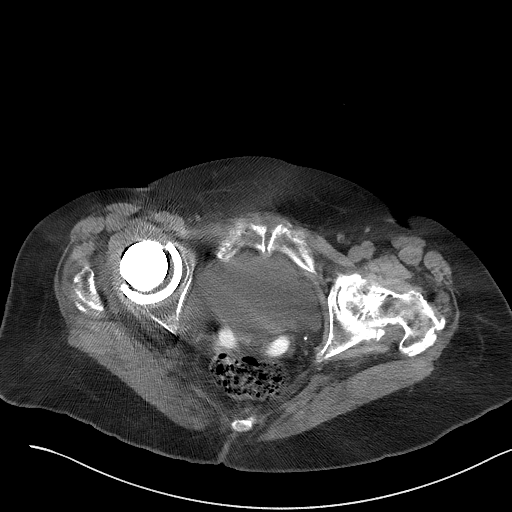
[im 25/93  soft-tissue]
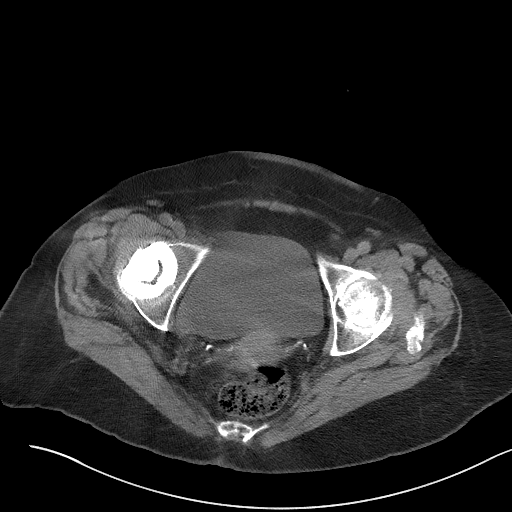
[im 33/93  soft-tissue]
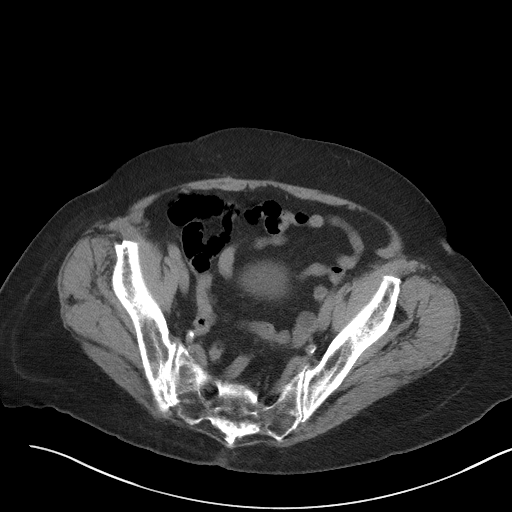
[im 41/93  soft-tissue]
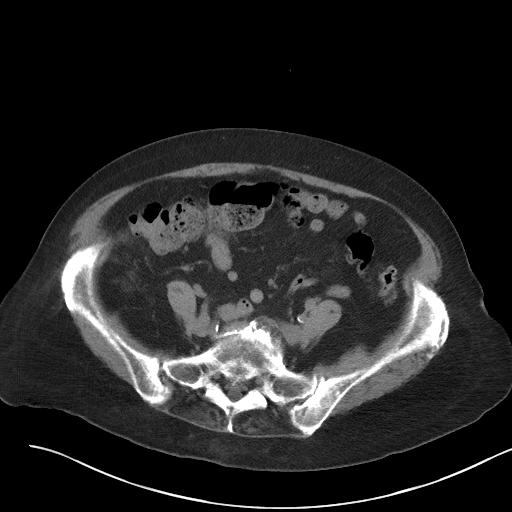
[im 49/93  soft-tissue]
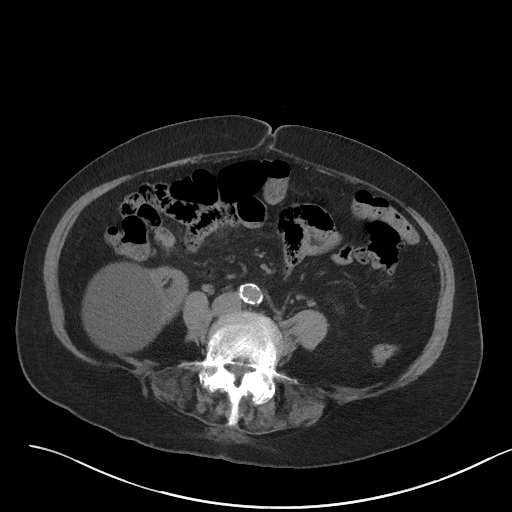
[im 53/93  soft-tissue]
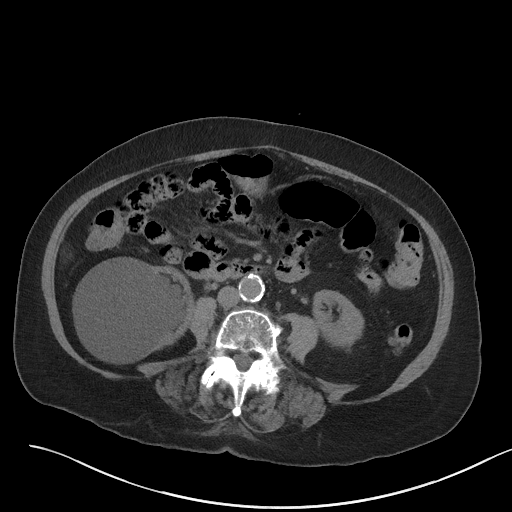
[im 61/93  soft-tissue]
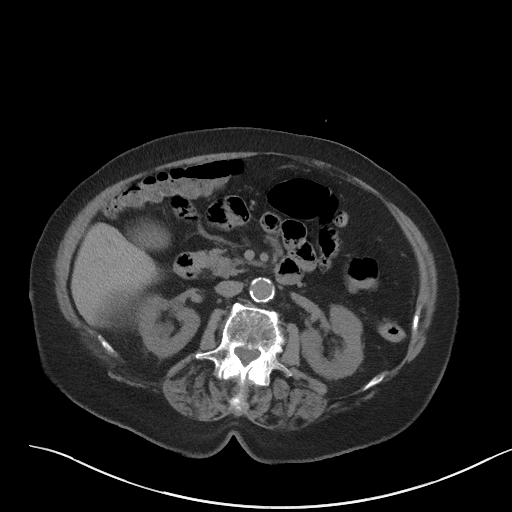
[im 61/93  bone]
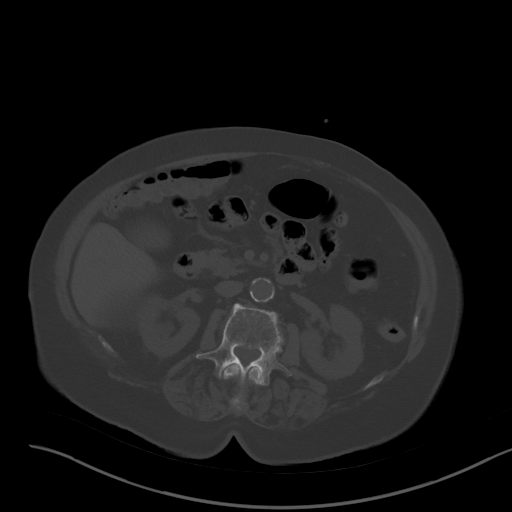
[im 69/93  soft-tissue]
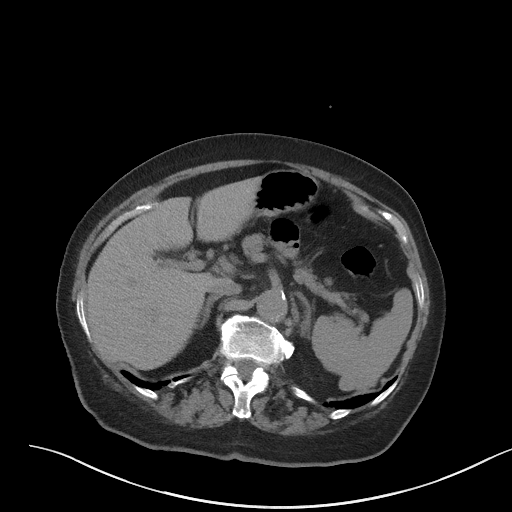
[im 73/93  soft-tissue]
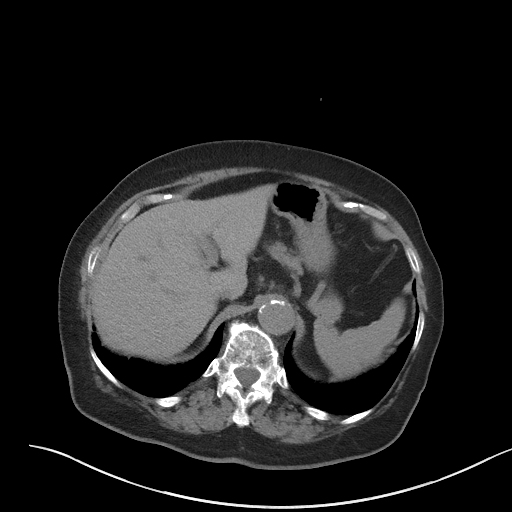
[im 81/93  soft-tissue]
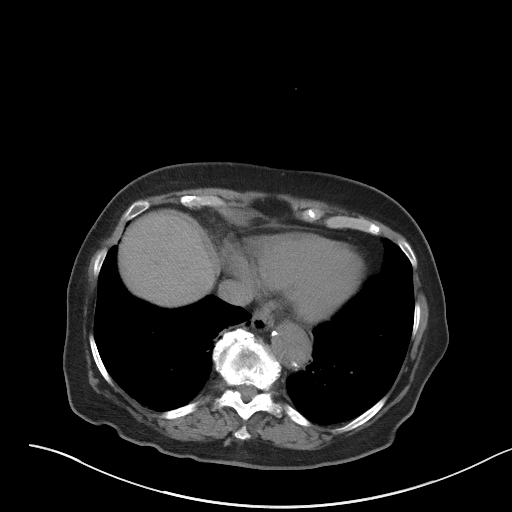
[im 89/93  soft-tissue]
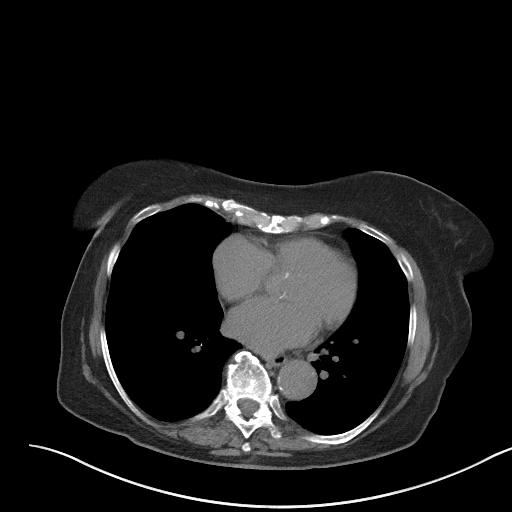

[Series 5: coronal · coronal · 0.84mm/px · 3 of 141 slices shown]
[im 47/141  soft-tissue]
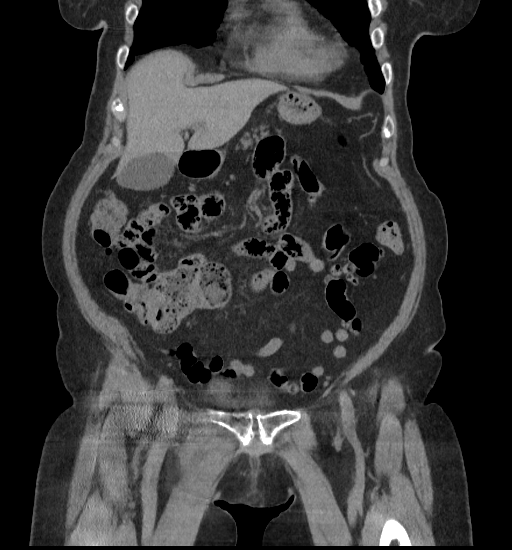
[im 63/141  soft-tissue]
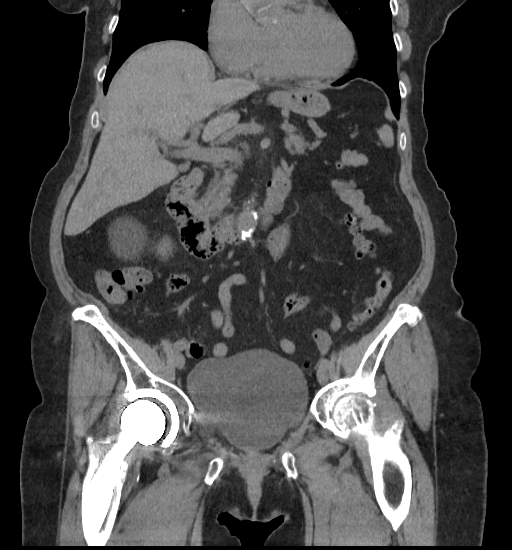
[im 78/141  soft-tissue]
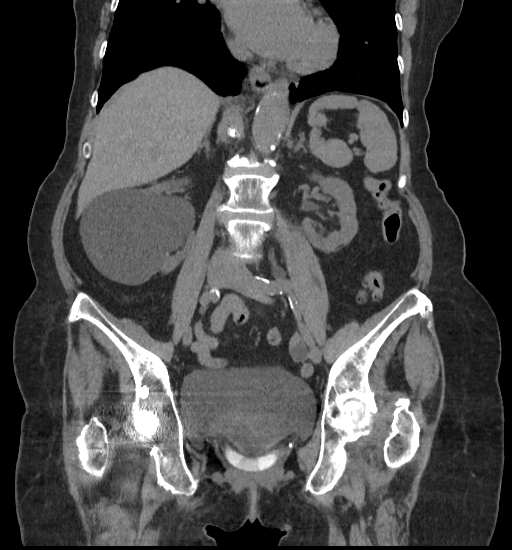

[16 of 46 positions shown; findings below may reference images not displayed]

FINDINGS: Lower chest: No acute abnormality.

Hepatobiliary: No focal liver abnormality is seen. High attenuation
material layers within the gallbladder lumen consistent with sludge
and/or small stones. No gallbladder wall thickening, or biliary
dilatation.

Pancreas: Unremarkable. No pancreatic ductal dilatation or
surrounding inflammatory changes.

Spleen: Normal in size without focal abnormality.

Adrenals/Urinary Tract: Normal adrenal glands. Enlarging interpolar
renal cyst on the right now measuring up to 9.8 x 8.2 x 8.0 cm
(volume = 340 cm^3). No hydronephrosis. No nephrolithiasis.

Stomach/Bowel: Stomach is within normal limits. No evidence of bowel
wall thickening, distention, or inflammatory changes. Colonic
diverticular disease without CT evidence of active inflammation.

Vascular/Lymphatic: Atherosclerotic calcifications throughout the
abdominal aorta. No evidence of aneurysm. Limited evaluation in the
absence of intravenous contrast. No suspicious lymphadenopathy.

Reproductive: Uterus and bilateral adnexa are unremarkable. Pessary
in place.

Other: No abdominal wall hernia or abnormality. No abdominopelvic
ascites.

Musculoskeletal: No acute fracture or aggressive appearing lytic or
blastic osseous lesion. Right hip arthroplasty prosthesis.
Multilevel degenerative disc disease and facet arthropathy. Mild
grade I anterolisthesis of L4 on L5.
IMPRESSION: 1. Large simple cyst arising from the interpolar aspect of the right
kidney measuring approximately 340 mL in volume. If the patient's
symptoms localize to the right, this may represent the source of the
patient's pain. Consider referral to Interventional Radiology as an
outpatient for therapeutic aspiration.
2. Sludge and/or small stones present within the gallbladder lumen.
3. No evidence of nephrolithiasis.
4. Atherosclerotic vascular calcifications. Aortic Atherosclerosis
([id number]-FRI.I).
5. Colonic diverticular disease without CT evidence of active
inflammation.
6. Multilevel degenerative disc disease and facet arthropathy.

## 2021-09-15 ENCOUNTER — Other Ambulatory Visit: Payer: Self-pay | Admitting: Family Medicine

## 2021-09-15 DIAGNOSIS — E890 Postprocedural hypothyroidism: Secondary | ICD-10-CM

## 2021-09-19 ENCOUNTER — Other Ambulatory Visit: Payer: Self-pay | Admitting: Internal Medicine

## 2021-09-19 DIAGNOSIS — E782 Mixed hyperlipidemia: Secondary | ICD-10-CM

## 2021-09-19 DIAGNOSIS — I10 Essential (primary) hypertension: Secondary | ICD-10-CM

## 2021-09-22 NOTE — Telephone Encounter (Signed)
Requested Prescriptions  Pending Prescriptions Disp Refills  . amLODipine (NORVASC) 5 MG tablet [Pharmacy Med Name: amLODIPine Besylate 5 MG Oral Tablet] 90 tablet 0    Sig: Take 1 tablet by mouth once daily     Cardiovascular: Calcium Channel Blockers 2 Passed - 09/19/2021  3:14 PM      Passed - Last BP in normal range    BP Readings from Last 1 Encounters:  06/20/21 122/62         Passed - Last Heart Rate in normal range    Pulse Readings from Last 1 Encounters:  06/20/21 87         Passed - Valid encounter within last 6 months    Recent Outpatient Visits          3 months ago Postoperative hypothyroidism   [place], DO   7 months ago Hypertension, unspecified type   [facility] Teodora Medici, DO   1 year ago Lumbar radiculopathy   [facility] Kathrine Haddock, NP   1 year ago Essential hypertension   [facility] Delsa Grana, PA-C   1 year ago Essential hypertension   [facility] Delsa Grana, PA-C      Future Appointments            In 3 months Delsa Grana, PA-C [facility], [place]   In 7 months Brendolyn Patty, MD [place]           . atorvastatin (LIPITOR) 20 MG tablet [Pharmacy Med Name: Atorvastatin Calcium 20 MG Oral Tablet] 90 tablet 0    Sig: TAKE 1 TABLET BY MOUTH AT BEDTIME     Cardiovascular:  Antilipid - Statins Failed - 09/19/2021  3:14 PM      Failed - Lipid Panel in normal range within the last 12 months    Cholesterol, Total  Date Value Ref Range Status  09/02/2015 170 100 - 199 mg/dL Final   Cholesterol  Date Value Ref Range Status  06/20/2021 155 <200 mg/dL Final   LDL Cholesterol (Calc)  Date Value Ref Range Status  06/20/2021 77 mg/dL (calc) Final    Comment:    Reference range: <100 . Desirable range <100 mg/dL for primary prevention;   <70 mg/dL for patients with CHD or diabetic  patients  with > or = 2 CHD risk factors. Marland Kitchen LDL-C is now calculated using the Martin-Hopkins  calculation, which is a validated novel method providing  better accuracy than the Friedewald equation in the  estimation of LDL-C.  Cresenciano Genre et al. Annamaria Helling. 4034;742(59): 2061-2068  (http://education.[employer].com/faq/FAQ164)    HDL  Date Value Ref Range Status  06/20/2021 52 > OR = 50 mg/dL Final  09/02/2015 55 >39 mg/dL Final   Triglycerides  Date Value Ref Range Status  06/20/2021 160 (H) <150 mg/dL Final         Passed - Patient is not pregnant      Passed - Valid encounter within last 12 months    Recent Outpatient Visits          3 months ago Postoperative hypothyroidism   [place], DO   7 months ago Hypertension, unspecified type   [facility] Teodora Medici, DO   1 year ago Lumbar radiculopathy   [facility] Kathrine Haddock, NP   1 year ago Essential hypertension   [facility] Delsa Grana,  PA-C   1 year ago Essential hypertension   [facility] Delsa Grana, PA-C      Future Appointments            In 3 months Delsa Grana, PA-C [facility], [place]   In 7 months Brendolyn Patty, MD [place]

## 2021-10-06 ENCOUNTER — Ambulatory Visit: Payer: Medicare HMO | Admitting: Obstetrics & Gynecology

## 2021-10-06 ENCOUNTER — Encounter: Payer: Self-pay | Admitting: Obstetrics & Gynecology

## 2021-10-06 VITALS — BP 112/64 | Ht 63.0 in | Wt 154.0 lb

## 2021-10-06 DIAGNOSIS — N814 Uterovaginal prolapse, unspecified: Secondary | ICD-10-CM | POA: Diagnosis not present

## 2021-10-06 DIAGNOSIS — L089 Local infection of the skin and subcutaneous tissue, unspecified: Secondary | ICD-10-CM

## 2021-10-06 DIAGNOSIS — Z4689 Encounter for fitting and adjustment of other specified devices: Secondary | ICD-10-CM | POA: Diagnosis not present

## 2021-10-06 MED ORDER — SULFAMETHOXAZOLE-TRIMETHOPRIM 800-160 MG PO TABS: 1.0000 | ORAL_TABLET | Freq: Two times a day (BID) | ORAL | 0 refills | Status: DC

## 2021-10-06 NOTE — Progress Notes (Signed)
   Subjective:    Patient ID: Gina [name], female    DOB: 1926/10/25, 88 y.o.   MRN: [id number]  HPI 88 yo widowed P3 here for her pessary maintenance visit. She has no complaints, reports normal bowel and bladder function. She has a small sore spot on her right buttock that she would like me to look at.   Review of Systems She has been abstinent since her third husband died 2 years ago. She lives at [facility].    Objective:   Physical Exam Well nourished, well hydrated White female, no apparent distress She looks at least 10 years younger than her stated age, very smart. She is conversing normally. She uses a walker to ambulate. Vulva- moderate atrophy Vagina with speculum- atrophy but no excoriations Her right buttock has a small erythematous indurated area (see photo under media). I removed and cleaned her pessary.     Assessment & Plan:   Uterine prolapse with cystocele- continue with pessary Come back every 3 months/prn sooner  Buttock skin infection- treat with bactrim ds for a week If not better by then, she will see her dermatologist.

## 2021-10-28 ENCOUNTER — Ambulatory Visit: Payer: Medicare HMO | Admitting: Oncology

## 2021-10-28 ENCOUNTER — Other Ambulatory Visit: Payer: Medicare HMO

## 2021-10-28 ENCOUNTER — Other Ambulatory Visit: Payer: Self-pay

## 2021-10-28 DIAGNOSIS — D473 Essential (hemorrhagic) thrombocythemia: Secondary | ICD-10-CM

## 2021-10-28 NOTE — Progress Notes (Signed)
[place]  [phone] [phone]  ID: Gina [name] OB: [birth year]  MR#: [id number]  WGY#:[id number]  Patient Care Team: Delsa Grana, PA-C as PCP - General (Family Medicine) Murlean Iba, MD (Internal Medicine) Gae Dry, MD as Referring Physician (Obstetrics and Gynecology) Estill Cotta, MD (Ophthalmology) Anabel Bene, MD as Referring Physician (Neurology) Beverly Gust, MD (Otolaryngology) Brendolyn Patty, MD (Dermatology) Lloyd Huger, MD as Consulting Physician (Oncology)  CHIEF COMPLAINT: Essential thrombocytosis  INTERVAL HISTORY: Patient returns to clinic today for repeat laboratory work and routine 61-monthevaluation.  She continues to feel well and remains asymptomatic.  She continues to tolerate Hydrea without significant side effects.  She has no neurologic complaints.  She denies any recent fevers or illnesses.  She has a good appetite and denies weight loss.  She denies any chest pain, shortness of breath, cough, or hemoptysis.  She denies any nausea, vomiting, constipation, or diarrhea.  She has no urinary complaints.  Offers no specific complaints today.  REVIEW OF SYSTEMS:   Review of Systems  Constitutional: Negative.  Negative for fever, malaise/fatigue and weight loss.  Respiratory: Negative.  Negative for cough, hemoptysis and shortness of breath.   Cardiovascular: Negative.  Negative for chest pain and leg swelling.  Gastrointestinal: Negative.  Negative for abdominal pain.  Genitourinary: Negative.  Negative for dysuria.  Musculoskeletal: Negative.  Negative for back pain.  Skin: Negative.  Negative for rash.  Neurological: Negative.  Negative for focal weakness, weakness and headaches.  Psychiatric/Behavioral: Negative.  The patient is not nervous/anxious.     As per HPI. Otherwise, a complete review of systems is negative.  PAST MEDICAL HISTORY: Past Medical History:  Diagnosis Date    Breast mass in female    right breast   Cataract    Difficult intubation 03/03/2016   September 06, 2013; left nasal fiberoptic intubation #7 ETT; see letter [facility] Dr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 CMike Gip  Hemorrhoids 07/29/2016   Hyperlipemia    Hyperlipidemia    history of    Hypothyroidism    Mild atherosclerosis of carotid artery 06/25/2015   Morton's neuroma    MRI contraindicated due to metal implant    Prolapse of uterus    Radiculopathy    Renal cyst    Rosacea    Shingles    Thrombocytosis     PAST SURGICAL HISTORY: Past Surgical History:  Procedure Laterality Date   ADENOIDECTOMY     APPENDECTOMY     biopsy, right breast      CATARACT EXTRACTION W/PHACO Left 03/25/2015   Procedure: CATARACT EXTRACTION PHACO AND INTRAOCULAR LENS PLACEMENT ([place];  Surgeon: SEstill Cotta MD;  Location: [facility] ORS;  Service: Ophthalmology;  Laterality: Left;  [place]02:09 AP% 27.3 CDE 59.97 fluid pack lot [id number]   CESAREAN SECTION     COLON SURGERY     JOINT REPLACEMENT Right    total hip   THYROID LOBECTOMY     THYROIDECTOMY     partial   TONSILLECTOMY     TOTAL HIP ARTHROPLASTY     UPPER GI ENDOSCOPY  10/17/2008    FAMILY HISTORY: Family History  Problem Relation Age of Onset   Stroke Mother    Stroke Father    Dementia Maternal Grandfather  ADVANCED DIRECTIVES (Y/N):  N  HEALTH MAINTENANCE: Social History   Tobacco Use   Smoking status: Former   Smokeless tobacco: Never  Scientific laboratory technician Use: Never used  Substance Use Topics   Alcohol use: No    Alcohol/week: 0.0 standard drinks of alcohol   Drug use: No     Colonoscopy:  PAP:  Bone density:  Lipid panel:  Allergies  Allergen Reactions   Novocain [Procaine] Palpitations    Current  Outpatient Medications  Medication Sig Dispense Refill   Acetaminophen 500 MG coapsule Take by mouth every 4 (four) hours as needed for fever.     amLODipine (NORVASC) 5 MG tablet Take 1 tablet by mouth once daily 90 tablet 0   aspirin EC 81 MG tablet Take 81 mg by mouth daily.      atorvastatin (LIPITOR) 20 MG tablet TAKE 1 TABLET BY MOUTH AT BEDTIME 90 tablet 0   Cholecalciferol (VITAMIN D3) 1000 units CAPS Take by mouth daily.      famotidine (PEPCID) 20 MG tablet Take one tab once to twice daily as needed 60 tablet 5   hydroxyurea (HYDREA) 500 MG capsule TAKE 1 CAPSULE BY MOUTH 4 DAYS A WEEK 60 capsule 2   irbesartan (AVAPRO) 150 MG tablet Take 1 tablet (150 mg total) by mouth daily. 90 tablet 3   ketoconazole (NIZORAL) 2 % shampoo Apply 1 application. topically 3 (three) times a week. Wash scalp 3 times weekly, let sit 5 minutes and rinse out 120 mL 11   levothyroxine (SYNTHROID) 88 MCG tablet TAKE 1 TABLET BY MOUTH ONCE DAILY BEFORE BREAKFAST 90 tablet 0   nystatin ointment (MYCOSTATIN) Apply 1 application topically 2 (two) times daily. 30 g 0   Polyethyl Glycol-Propyl Glycol (SYSTANE) 0.4-0.3 % SOLN Apply to eye.     No current facility-administered medications for this visit.    OBJECTIVE: Vitals:   10/29/21 1121  BP: 111/89  Pulse: 63  Resp: 18  Temp: (!) 97.2 F (36.2 C)  SpO2: 98%     Body mass index is 27.14 kg/m.    ECOG FS:0 - Asymptomatic  General: Well-developed, well-nourished, no acute distress. Eyes: [name] conjunctiva, anicteric sclera. HEENT: Normocephalic, moist mucous membranes. Lungs: No audible wheezing or coughing. Heart: Regular rate and rhythm. Abdomen: Soft, nontender, no obvious distention. Musculoskeletal: No edema, cyanosis, or clubbing. Neuro: Alert, answering all questions appropriately. Cranial nerves grossly intact. Skin: No rashes or petechiae noted. Psych: Normal affect.  LAB RESULTS:  Lab Results  Component Value Date   NA 134 (L)  10/29/2021   K 4.5 10/29/2021   CL 102 10/29/2021   CO2 25 10/29/2021   GLUCOSE 107 (H) 10/29/2021   BUN 26 (H) 10/29/2021   CREATININE 1.19 (H) 10/29/2021   CALCIUM 9.5 10/29/2021   PROT 7.6 10/29/2021   ALBUMIN 4.3 10/29/2021   AST 21 10/29/2021   ALT 17 10/29/2021   ALKPHOS 86 10/29/2021   BILITOT 1.4 (H) 10/29/2021   GFRNONAA 43 (L) 10/29/2021   GFRAA 43 (L) 08/12/2020    Lab Results  Component Value Date   WBC 8.0 10/29/2021   NEUTROABS 5.7 10/29/2021   HGB 14.1 10/29/2021   HCT 41.8 10/29/2021   MCV 94.4 10/29/2021   PLT 470 (H) 10/29/2021     STUDIES: No results found.  ASSESSMENT: Essential thrombocytosis  PLAN:   1. Essential thrombocytosis: Patient's platelet count remains mildly elevated at 470 today, but essentially unchanged.  She has ranged from 390-595  since at least July 2020.  Continue current Hydrea regimen of 4 days a week on Monday, Wednesday, Friday, and Saturday. Continue 81 mg of aspirin.  No further interventions are needed.  Return to clinic in 3 months for laboratory work only and in 6 months for laboratory work and further evaluation. 2.  Renal insufficiency: Chronic and unchanged.  Patient's creatinine is 1.19 today. 3.  Hyperbilirubinemia: Patient's total bilirubin is mildly elevated at 1.4.  Monitor.    Patient expressed understanding and was in agreement with this plan. She also understands that She can call clinic at any time with any questions, concerns, or complaints.    Lloyd Huger, MD   10/31/2021 1:53 PM

## 2021-10-29 ENCOUNTER — Encounter: Payer: Self-pay | Admitting: Oncology

## 2021-10-29 ENCOUNTER — Inpatient Hospital Stay: Payer: Medicare HMO | Attending: Oncology

## 2021-10-29 ENCOUNTER — Inpatient Hospital Stay (HOSPITAL_BASED_OUTPATIENT_CLINIC_OR_DEPARTMENT_OTHER): Payer: Medicare HMO | Admitting: Oncology

## 2021-10-29 VITALS — BP 111/89 | HR 63 | Temp 97.2°F | Resp 18 | Wt 153.2 lb

## 2021-10-29 DIAGNOSIS — D473 Essential (hemorrhagic) thrombocythemia: Secondary | ICD-10-CM | POA: Insufficient documentation

## 2021-10-29 DIAGNOSIS — N289 Disorder of kidney and ureter, unspecified: Secondary | ICD-10-CM | POA: Diagnosis not present

## 2021-10-29 DIAGNOSIS — Z79899 Other long term (current) drug therapy: Secondary | ICD-10-CM | POA: Insufficient documentation

## 2021-10-29 LAB — COMPREHENSIVE METABOLIC PANEL
ALT: 17 U/L (ref 0–44)
AST: 21 U/L (ref 15–41)
Albumin: 4.3 g/dL (ref 3.5–5.0)
Alkaline Phosphatase: 86 U/L (ref 38–126)
Anion gap: 7 (ref 5–15)
BUN: 26 mg/dL — ABNORMAL HIGH (ref 8–23)
CO2: 25 mmol/L (ref 22–32)
Calcium: 9.5 mg/dL (ref 8.9–10.3)
Chloride: 102 mmol/L (ref 98–111)
Creatinine, Ser: 1.19 mg/dL — ABNORMAL HIGH (ref 0.44–1.00)
GFR, Estimated: 43 mL/min — ABNORMAL LOW (ref >60)
Glucose, Bld: 107 mg/dL — ABNORMAL HIGH (ref 70–99)
Potassium: 4.5 mmol/L (ref 3.5–5.1)
Sodium: 134 mmol/L — ABNORMAL LOW (ref 135–145)
Total Bilirubin: 1.4 mg/dL — ABNORMAL HIGH (ref 0.3–1.2)
Total Protein: 7.6 g/dL (ref 6.5–8.1)

## 2021-10-29 LAB — CBC WITH DIFFERENTIAL/PLATELET
Abs Immature Granulocytes: 0.05 10*3/uL (ref 0.00–0.07)
Basophils Absolute: 0.1 10*3/uL (ref 0.0–0.1)
Basophils Relative: 1 %
Eosinophils Absolute: 0.1 10*3/uL (ref 0.0–0.5)
Eosinophils Relative: 2 %
HCT: 41.8 % (ref 36.0–46.0)
Hemoglobin: 14.1 g/dL (ref 12.0–15.0)
Immature Granulocytes: 1 %
Lymphocytes Relative: 17 %
Lymphs Abs: 1.3 10*3/uL (ref 0.7–4.0)
MCH: 31.8 pg (ref 26.0–34.0)
MCHC: 33.7 g/dL (ref 30.0–36.0)
MCV: 94.4 fL (ref 80.0–100.0)
Monocytes Absolute: 0.8 10*3/uL (ref 0.1–1.0)
Monocytes Relative: 10 %
Neutro Abs: 5.7 10*3/uL (ref 1.7–7.7)
Neutrophils Relative %: 69 %
Platelets: 470 10*3/uL — ABNORMAL HIGH (ref 150–400)
RBC: 4.43 MIL/uL (ref 3.87–5.11)
RDW: 13.9 % (ref 11.5–15.5)
WBC: 8 10*3/uL (ref 4.0–10.5)
nRBC: 0 % (ref 0.0–0.2)

## 2021-10-29 NOTE — Progress Notes (Signed)
Patient denies any concerns today.  

## 2021-11-10 DIAGNOSIS — I129 Hypertensive chronic kidney disease with stage 1 through stage 4 chronic kidney disease, or unspecified chronic kidney disease: Secondary | ICD-10-CM | POA: Diagnosis not present

## 2021-11-10 DIAGNOSIS — E871 Hypo-osmolality and hyponatremia: Secondary | ICD-10-CM | POA: Diagnosis not present

## 2021-11-10 DIAGNOSIS — N281 Cyst of kidney, acquired: Secondary | ICD-10-CM | POA: Diagnosis not present

## 2021-11-10 DIAGNOSIS — N1832 Chronic kidney disease, stage 3b: Secondary | ICD-10-CM | POA: Diagnosis not present

## 2021-11-10 DIAGNOSIS — R600 Localized edema: Secondary | ICD-10-CM | POA: Diagnosis not present

## 2021-12-08 ENCOUNTER — Other Ambulatory Visit: Payer: Self-pay | Admitting: Family Medicine

## 2021-12-08 DIAGNOSIS — E890 Postprocedural hypothyroidism: Secondary | ICD-10-CM

## 2021-12-22 ENCOUNTER — Ambulatory Visit (INDEPENDENT_AMBULATORY_CARE_PROVIDER_SITE_OTHER): Payer: Medicare HMO | Admitting: Family Medicine

## 2021-12-22 ENCOUNTER — Encounter: Payer: Self-pay | Admitting: Family Medicine

## 2021-12-22 VITALS — BP 134/72 | HR 81 | Temp 97.6°F | Resp 16 | Ht 63.0 in | Wt 154.6 lb

## 2021-12-22 DIAGNOSIS — E890 Postprocedural hypothyroidism: Secondary | ICD-10-CM | POA: Diagnosis not present

## 2021-12-22 DIAGNOSIS — Z23 Encounter for immunization: Secondary | ICD-10-CM | POA: Diagnosis not present

## 2021-12-22 DIAGNOSIS — R7303 Prediabetes: Secondary | ICD-10-CM

## 2021-12-22 DIAGNOSIS — N1832 Chronic kidney disease, stage 3b: Secondary | ICD-10-CM

## 2021-12-22 DIAGNOSIS — G629 Polyneuropathy, unspecified: Secondary | ICD-10-CM | POA: Diagnosis not present

## 2021-12-22 DIAGNOSIS — E782 Mixed hyperlipidemia: Secondary | ICD-10-CM

## 2021-12-22 DIAGNOSIS — K219 Gastro-esophageal reflux disease without esophagitis: Secondary | ICD-10-CM

## 2021-12-22 DIAGNOSIS — Z780 Asymptomatic menopausal state: Secondary | ICD-10-CM | POA: Diagnosis not present

## 2021-12-22 DIAGNOSIS — S8001XA Contusion of right knee, initial encounter: Secondary | ICD-10-CM

## 2021-12-22 DIAGNOSIS — D473 Essential (hemorrhagic) thrombocythemia: Secondary | ICD-10-CM

## 2021-12-22 DIAGNOSIS — I10 Essential (primary) hypertension: Secondary | ICD-10-CM | POA: Diagnosis not present

## 2021-12-22 DIAGNOSIS — R202 Paresthesia of skin: Secondary | ICD-10-CM | POA: Diagnosis not present

## 2021-12-22 DIAGNOSIS — R140 Abdominal distension (gaseous): Secondary | ICD-10-CM

## 2021-12-22 DIAGNOSIS — M545 Low back pain, unspecified: Secondary | ICD-10-CM

## 2021-12-22 DIAGNOSIS — H9193 Unspecified hearing loss, bilateral: Secondary | ICD-10-CM

## 2021-12-22 DIAGNOSIS — W19XXXA Unspecified fall, initial encounter: Secondary | ICD-10-CM

## 2021-12-22 MED ORDER — TETANUS-DIPHTH-ACELL PERTUSSIS 5-2-15.5 LF-MCG/0.5 IM SUSP: 0.5000 mL | Freq: Once | INTRAMUSCULAR | 0 refills | Status: AC

## 2021-12-22 NOTE — Patient Instructions (Signed)
Trial of prilosec over the counter for 2-4 weeks Take at the end of the day before bed, try to stop eating 2 hours prior to this If you have no change in your abdominal symptoms or bloating then you can stop taking prilosec and return to your pepcid as needed     Low-FODMAP Eating Plan  FODMAP stands for fermentable oligosaccharides, disaccharides, monosaccharides, and polyols. These are sugars that are hard for some people to digest. A low-FODMAP eating plan may help some people who have irritable bowel syndrome (IBS) and certain other bowel (intestinal) diseases to manage their symptoms. This meal plan can be complicated to follow. Work with a diet and nutrition specialist (dietitian) to make a low-FODMAP eating plan that is right for you. A dietitian can help make sure that you get enough nutrition from this diet. What are tips for following this plan? Reading food labels Check labels for hidden FODMAPs such as: High-fructose syrup. Honey. Agave. Natural fruit flavors. Onion or garlic powder. Choose low-FODMAP foods that contain 3-4 grams of fiber per serving. Check food labels for serving sizes. Eat only one serving at a time to make sure FODMAP levels stay low. Shopping Shop with a list of foods that are recommended on this diet and make a meal plan. Meal planning Follow a low-FODMAP eating plan for up to 6 weeks, or as told by your health care provider or dietitian. To follow the eating plan: Eliminate high-FODMAP foods from your diet completely. Choose only low-FODMAP foods to eat. You will do this for 2-6 weeks. Gradually reintroduce high-FODMAP foods into your diet one at a time. Most people should wait a few days before introducing the next new high-FODMAP food into their meal plan. Your dietitian can recommend how quickly you may reintroduce foods. Keep a daily record of what and how much you eat and drink. Make note of any symptoms that you have after eating. Review your daily  record with a dietitian regularly to identify which foods you can eat and which foods you should avoid. General tips Drink enough fluid each day to keep your urine pale yellow. Avoid processed foods. These often have added sugar and may be high in FODMAPs. Avoid most dairy products, whole grains, and sweeteners. Work with a dietitian to make sure you get enough fiber in your diet. Avoid high FODMAP foods at meals to manage symptoms. Recommended foods Fruits Bananas, oranges, tangerines, lemons, limes, blueberries, raspberries, strawberries, grapes, cantaloupe, honeydew melon, kiwi, papaya, passion fruit, and pineapple. Limited amounts of dried cranberries, banana chips, and shredded coconut. Vegetables Eggplant, zucchini, cucumber, peppers, green beans, bean sprouts, lettuce, arugula, kale, Swiss chard, spinach, collard greens, bok choy, summer squash, potato, and tomato. Limited amounts of corn, carrot, and sweet potato. Green parts of scallions. Grains Gluten-free grains, such as rice, oats, buckwheat, quinoa, corn, polenta, and millet. Gluten-free pasta, bread, or cereal. Rice noodles. Corn tortillas. Meats and other proteins Unseasoned beef, pork, poultry, or fish. Eggs. Berniece Salines. Tofu (firm) and tempeh. Limited amounts of nuts and seeds, such as almonds, walnuts, [place] nuts, pecans, peanuts, nut butters, pumpkin seeds, chia seeds, and sunflower seeds. Dairy Lactose-free milk, yogurt, and kefir. Lactose-free cottage cheese and ice cream. Non-dairy milks, such as almond, coconut, hemp, and rice milk. Non-dairy yogurt. Limited amounts of goat cheese, brie, mozzarella, parmesan, swiss, and other hard cheeses. Fats and oils Butter-free spreads. Vegetable oils, such as olive, canola, and sunflower oil. Seasoning and other foods Artificial sweeteners with names that do not  end in "ol," such as aspartame, saccharine, and stevia. Maple syrup, white table sugar, raw sugar, brown sugar, and molasses.  Mayonnaise, soy sauce, and tamari. Fresh basil, coriander, parsley, rosemary, and thyme. Beverages Water and mineral water. Sugar-sweetened soft drinks. Small amounts of orange juice or cranberry juice. Black and green tea. Most dry wines. Coffee. The items listed above may not be a complete list of foods and beverages you can eat. Contact a dietitian for more information. Foods to avoid Fruits Fresh, dried, and juiced forms of apple, pear, watermelon, peach, plum, cherries, apricots, blackberries, boysenberries, figs, nectarines, and mango. Avocado. Vegetables Chicory root, artichoke, asparagus, cabbage, snow peas, Brussels sprouts, broccoli, sugar snap peas, mushrooms, celery, and cauliflower. Onions, garlic, leeks, and the white part of scallions. Grains Wheat, including kamut, durum, and semolina. Barley and bulgur. Couscous. Wheat-based cereals. Wheat noodles, bread, crackers, and pastries. Meats and other proteins Fried or fatty meat. Sausage. Cashews and pistachios. Soybeans, baked beans, black beans, chickpeas, kidney beans, fava beans, navy beans, lentils, black-eyed peas, and split peas. Dairy Milk, yogurt, ice cream, and soft cheese. Cream and sour cream. Milk-based sauces. Custard. Buttermilk. Soy milk. Seasoning and other foods Any sugar-free gum or candy. Foods that contain artificial sweeteners such as sorbitol, mannitol, isomalt, or xylitol. Foods that contain honey, high-fructose corn syrup, or agave. Bouillon, vegetable stock, beef stock, and chicken stock. Garlic and onion powder. Condiments made with onion, such as hummus, chutney, pickles, relish, salad dressing, and salsa. Tomato paste. Beverages Chicory-based drinks. Coffee substitutes. Chamomile tea. Fennel tea. Sweet or fortified wines such as port or sherry. Diet soft drinks made with isomalt, mannitol, maltitol, sorbitol, or xylitol. Apple, pear, and mango juice. Juices with high-fructose corn syrup. The items listed  above may not be a complete list of foods and beverages you should avoid. Contact a dietitian for more information. Summary FODMAP stands for fermentable oligosaccharides, disaccharides, monosaccharides, and polyols. These are sugars that are hard for some people to digest. A low-FODMAP eating plan is a short-term diet that helps to ease symptoms of certain bowel diseases. The eating plan usually lasts up to 6 weeks. After that, high-FODMAP foods are reintroduced gradually and one at a time. This can help you find out which foods may be causing symptoms. A low-FODMAP eating plan can be complicated. It is best to work with a dietitian who has experience with this type of plan. This information is not intended to replace advice given to you by your health care provider. Make sure you discuss any questions you have with your health care provider. Document Revised: 06/29/2019 Document Reviewed: 06/29/2019 Elsevier Patient Education  [place].

## 2021-12-22 NOTE — Progress Notes (Signed)
Name: Gina [name]   MRN: [id number]    DOB: 02-23-1927   Date:12/22/2021       Progress Note  Chief Complaint  Patient presents with   Follow-up   Hyperlipidemia   Hypertension   Hypothyroidism   PreDM     Subjective:   Gina [name] is a 88 y.o. female, presents to clinic for routine f/up but she has a list of concerns and sx with her and additionally has several forms to complete - forms for her facility and [facility]  Complains of feeling cold all the time Numbness to her thighs and calves Fall recently while "running" with her rolling walking, fell forward onto both knees, right knee sore with contusion some low back/hip pain as well Abdominal bloating and getting bigger Decreased hearing Wants Dexa     Current Outpatient Medications:    Acetaminophen 500 MG coapsule, Take by mouth every 4 (four) hours as needed for fever., Disp: , Rfl:    amLODipine (NORVASC) 5 MG tablet, Take 1 tablet by mouth once daily, Disp: 90 tablet, Rfl: 0   aspirin EC 81 MG tablet, Take 81 mg by mouth daily. , Disp: , Rfl:    atorvastatin (LIPITOR) 20 MG tablet, TAKE 1 TABLET BY MOUTH AT BEDTIME, Disp: 90 tablet, Rfl: 0   Cholecalciferol (VITAMIN D3) 1000 units CAPS, Take by mouth daily. , Disp: , Rfl:    famotidine (PEPCID) 20 MG tablet, Take one tab once to twice daily as needed, Disp: 60 tablet, Rfl: 5   hydroxyurea (HYDREA) 500 MG capsule, TAKE 1 CAPSULE BY MOUTH 4 DAYS A WEEK, Disp: 60 capsule, Rfl: 2   irbesartan (AVAPRO) 150 MG tablet, Take 1 tablet (150 mg total) by mouth daily., Disp: 90 tablet, Rfl: 3   ketoconazole (NIZORAL) 2 % shampoo, Apply 1 application. topically 3 (three) times a week. Wash scalp 3 times weekly, let sit 5 minutes and rinse out, Disp: 120 mL, Rfl: 11   levothyroxine (SYNTHROID) 88 MCG tablet, TAKE 1 TABLET BY MOUTH ONCE DAILY BEFORE BREAKFAST, Disp: 90 tablet, Rfl: 0   nystatin ointment (MYCOSTATIN), Apply 1 application topically 2 (two) times daily., Disp: 30 g, Rfl:  0   Polyethyl Glycol-Propyl Glycol (SYSTANE) 0.4-0.3 % SOLN, Apply to eye., Disp: , Rfl:   Patient Active Problem List   Diagnosis Date Noted   Prediabetes 06/20/2021   Paresthesia of upper and lower extremities of both sides 03/20/2020   Requires assistance with activities of daily living (ADL) 03/20/2020   Mixed hyperlipidemia 03/19/2020   Anxiety with depression 03/19/2020   Degenerative disc disease, cervical 05/10/2019   De Quervain's tenosynovitis, left 05/10/2019   Bilateral hand numbness 05/10/2019   DNR (do not resuscitate) 04/22/2018   Venous stasis 04/21/2018   Cystocele with prolapse 04/01/2017   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   Essential thrombocytosis ([place]) 07/23/2015   Mild atherosclerosis of carotid artery 06/25/2015   Dyslipidemia 06/03/2015   Diverticulosis 05/28/2015   GERD without esophagitis 02/06/2015   Neuropathy 01/31/2015   Cervical radiculopathy 01/31/2015   Stage 3b chronic kidney disease ([place]) 10/06/2014   Renal cyst 10/06/2014   Uterine prolapse 10/06/2014   Cervical radiculopathy due to degenerative joint disease of spine 10/06/2014   History of right hip replacement 10/06/2014   Hypothyroidism 10/05/2014   Essential hypertension 09/13/2014    Past Surgical History:  Procedure Laterality Date   ADENOIDECTOMY     APPENDECTOMY     biopsy, right breast      CATARACT EXTRACTION  W/PHACO Left 03/25/2015   Procedure: CATARACT EXTRACTION PHACO AND INTRAOCULAR LENS PLACEMENT (IOC);  Surgeon: Estill Cotta, MD;  Location: [facility] OR[facility];  Service: Ophthalmology;  Laterality: Left;  [place] 02:09 AP% 27.3 CDE 59.97 fluid pack lot #[id number]   CESAREAN SECTION     COLON SURGERY     JOINT REPLACEMENT Right    total hip   THYROID LOBECTOMY     THYROIDECTOMY     partial   TONSILLECTOMY     TOTAL HIP ARTHROPLASTY     UPPER GI ENDOSCOPY  10/17/2008    Family History  Problem Relation Age of Onset   Stroke Mother    Stroke Father    Dementia Maternal  Grandfather     Social History   Tobacco Use   Smoking status: Former   Smokeless tobacco: Never  Scientific laboratory technician Use: Never used  Substance Use Topics   Alcohol use: No    Alcohol/week: 0.0 standard drinks of alcohol   Drug use: No     Allergies  Allergen Reactions   Novocain [Procaine] Palpitations    Health Maintenance  Topic Date Due   TETANUS/TDAP  Never done   Zoster Vaccines- Shingrix (1 of 2) Never done   DEXA SCAN  Never done   Medicare Annual Wellness (AWV)  01/05/2018   INFLUENZA VACCINE  09/23/2021   COVID-19 Vaccine (3 - Pfizer risk series) 01/07/2022 (Originally 05/28/2019)   Pneumonia Vaccine 18+ Years old  Completed   HPV VACCINES  Aged Out   MAMMOGRAM  Discontinued    Chart Review Today: I personally reviewed active problem list, medication list, allergies, family history, social history, health maintenance, notes from last encounter, lab results, imaging with the patient/caregiver today.   Review of Systems  Constitutional: Negative.   HENT: Negative.    Eyes: Negative.   Respiratory: Negative.    Cardiovascular: Negative.   Gastrointestinal: Negative.   Endocrine: Negative.   Genitourinary: Negative.   Musculoskeletal: Negative.   Skin: Negative.   Allergic/Immunologic: Negative.   Neurological: Negative.   Hematological: Negative.   Psychiatric/Behavioral: Negative.    All other systems reviewed and are negative.    Objective:   Vitals:   12/22/21 1033  BP: 134/72  Pulse: 81  Resp: 16  Temp: 97.6 F (36.4 C)  TempSrc: Oral  SpO2: 95%  Weight: 154 lb 9.6 oz (70.1 kg)  Height: '5\' 3"'$  (1.6 m)    Body mass index is 27.39 kg/m.  Physical Exam Vitals and nursing note reviewed.  Constitutional:      General: She is not in acute distress.    Appearance: Normal appearance. She is well-developed. She is not ill-appearing, toxic-appearing or diaphoretic.     Comments: Well appearing, looks younger than stated age  HENT:      Head: Normocephalic and atraumatic.     Right Ear: External ear normal.     Left Ear: External ear normal.  Eyes:     General: No scleral icterus.       Right eye: No discharge.        Left eye: No discharge.     Conjunctiva/sclera: Conjunctivae normal.  Neck:     Trachea: No tracheal deviation.  Cardiovascular:     Rate and Rhythm: Normal rate and regular rhythm.     Chest Wall: PMI is not displaced.     Pulses: Normal pulses.          Radial pulses are 2+ on the right side  and 2+ on the left side.     Heart sounds: Normal heart sounds. No murmur heard.    No friction rub. No gallop.  Pulmonary:     Effort: Pulmonary effort is normal. No respiratory distress.     Breath sounds: Normal breath sounds. No stridor. No wheezing, rhonchi or rales.  Abdominal:     General: Bowel sounds are normal. There is no distension.     Palpations: Abdomen is soft.     Tenderness: There is no abdominal tenderness.  Musculoskeletal:     Lumbar back: No swelling, tenderness or bony tenderness.     Right hip: Normal. No tenderness, bony tenderness or crepitus. Normal range of motion.     Right knee: No swelling, deformity, bony tenderness or crepitus. Normal range of motion. No tenderness.     Right lower leg: Edema present.     Left lower leg: Edema present.     Comments: Lower back No midline tenderness or step off No lumbar paraspinal muscle ttp Right low SI joint area mild ttp  Skin:    General: Skin is warm and dry.     Findings: No rash.     Comments: Abrasion to right knee and lower right outer leg  Neurological:     Mental Status: She is alert.     Motor: No abnormal muscle tone.     Coordination: Coordination normal.  Psychiatric:        Mood and Affect: Mood normal.        Behavior: Behavior normal. Behavior is cooperative.         Assessment & Plan:      ICD-10-CM   1. Essential hypertension  I10    fairly well controlled here today, however unable to get to address with  multiple other CC    2. Mixed hyperlipidemia  E78.2    not addressed with other CC    3. Postoperative hypothyroidism  E89.0 TSH   on levothyroxine, some cold intolerance, recheck TSH and adjust rx if needed    4. Prediabetes  R73.03 Hemoglobin A1c   due for labs, has been stable    5. Stage 3b chronic kidney disease (HCC)  N18.32 VITAMIN D 25 Hydroxy (Vit-D Deficiency, Fractures)   monitoring, BP well controlled, she follows with nephrology    6. Postmenopausal estrogen deficiency  Z78.0 DG Bone Density    VITAMIN D 25 Hydroxy (Vit-D Deficiency, Fractures)   dexa due    7. Need for Tdap vaccination  Z23 Tdap (ADACEL) 06-24-13.5 LF-MCG/0.5 injection   not covered in clinic by insurance    8. Decreased hearing of both ears  H91.93 Ambulatory referral to ENT   she requests referral to f/up on worsening decreased hearing, prior [facility] pt    9. Paresthesia of upper and lower extremities of both sides  R20.2 TSH    Iron, TIBC and Ferritin Panel    Vitamin B12    Ambulatory referral to Physical Therapy   chronic, no change, but she is concerned about numbness, she denies pain or need for med for nerve pain    10. Neuropathy  G62.9 TSH    Iron, TIBC and Ferritin Panel    Vitamin B12    Ambulatory referral to Physical Therapy   unchanged - recheck deficiencies, she complains of sx, but she denies need for nerve pain tx, overall stable, chronic    11. Essential thrombocytosis (HCC)  D47.3    stable, per hem/onc, recent labs  and OV reviewed    12. GERD without esophagitis  K21.9    abd sx may be poorly controlled GERD, discussed PPI trial     13. Abdominal bloating  R14.0    trial of ppi x 2-4 weeks then if no improvement can try food elimination diet efforts, f/up in 1 month    14. Fall, initial encounter  W19.XXXA Ambulatory referral to Physical Therapy   fell forward onto knee due to running with her walker, mild abrasion to right knee, otherwise knee exam normal, right hip  exam normal    15. Contusion of right knee, initial encounter  S80.01XA    mild abrasion, rest, ice, she endorses discomfort but overall normal exam except abrasion    16. Right low back pain, unspecified chronicity, unspecified whether sciatica present  M54.50 Ambulatory referral to Physical Therapy   mild right low back ttp, she is concerned about chronic neuropathy and falls and request PT referral         No follow-ups on file.   Delsa Grana, PA-C 12/22/21 10:42 AM

## 2021-12-23 LAB — VITAMIN D 25 HYDROXY (VIT D DEFICIENCY, FRACTURES): Vit D, 25-Hydroxy: 39 ng/mL (ref 30–100)

## 2021-12-23 LAB — HEMOGLOBIN A1C
Hgb A1c MFr Bld: 6.3 % of total Hgb — ABNORMAL HIGH (ref <5.7)
Mean Plasma Glucose: 134 mg/dL
eAG (mmol/L): 7.4 mmol/L

## 2021-12-23 LAB — IRON,TIBC AND FERRITIN PANEL
%SAT: 23 % (calc) (ref 16–45)
Ferritin: 82 ng/mL (ref 16–288)
Iron: 69 ug/dL (ref 45–160)
TIBC: 301 mcg/dL (calc) (ref 250–450)

## 2021-12-23 LAB — VITAMIN B12: Vitamin B-12: 356 pg/mL (ref 200–1100)

## 2021-12-23 LAB — TSH: TSH: 2.91 mIU/L (ref 0.40–4.50)

## 2021-12-25 ENCOUNTER — Other Ambulatory Visit: Payer: Self-pay | Admitting: Family Medicine

## 2021-12-25 DIAGNOSIS — G629 Polyneuropathy, unspecified: Secondary | ICD-10-CM

## 2021-12-25 DIAGNOSIS — R202 Paresthesia of skin: Secondary | ICD-10-CM

## 2021-12-25 DIAGNOSIS — E782 Mixed hyperlipidemia: Secondary | ICD-10-CM | POA: Diagnosis not present

## 2021-12-25 DIAGNOSIS — K219 Gastro-esophageal reflux disease without esophagitis: Secondary | ICD-10-CM | POA: Diagnosis not present

## 2021-12-25 DIAGNOSIS — R7303 Prediabetes: Secondary | ICD-10-CM | POA: Diagnosis not present

## 2021-12-25 DIAGNOSIS — Z87891 Personal history of nicotine dependence: Secondary | ICD-10-CM | POA: Diagnosis not present

## 2021-12-25 DIAGNOSIS — Z96641 Presence of right artificial hip joint: Secondary | ICD-10-CM | POA: Diagnosis not present

## 2021-12-25 DIAGNOSIS — N1832 Chronic kidney disease, stage 3b: Secondary | ICD-10-CM | POA: Diagnosis not present

## 2021-12-25 DIAGNOSIS — R69 Illness, unspecified: Secondary | ICD-10-CM | POA: Diagnosis not present

## 2021-12-25 DIAGNOSIS — Z9181 History of falling: Secondary | ICD-10-CM | POA: Diagnosis not present

## 2021-12-25 DIAGNOSIS — E039 Hypothyroidism, unspecified: Secondary | ICD-10-CM | POA: Diagnosis not present

## 2021-12-25 DIAGNOSIS — M5412 Radiculopathy, cervical region: Secondary | ICD-10-CM | POA: Diagnosis not present

## 2021-12-25 DIAGNOSIS — I129 Hypertensive chronic kidney disease with stage 1 through stage 4 chronic kidney disease, or unspecified chronic kidney disease: Secondary | ICD-10-CM | POA: Diagnosis not present

## 2021-12-25 DIAGNOSIS — E538 Deficiency of other specified B group vitamins: Secondary | ICD-10-CM

## 2021-12-25 MED ORDER — B-12 500 MCG PO TABS: 500.0000 ug | ORAL_TABLET | Freq: Every day | ORAL | 5 refills | Status: DC

## 2021-12-29 ENCOUNTER — Ambulatory Visit: Payer: Medicare HMO | Admitting: Obstetrics & Gynecology

## 2021-12-29 ENCOUNTER — Encounter: Payer: Self-pay | Admitting: Obstetrics & Gynecology

## 2021-12-29 VITALS — BP 121/67 | HR 88 | Ht 64.0 in | Wt 153.0 lb

## 2021-12-29 DIAGNOSIS — N814 Uterovaginal prolapse, unspecified: Secondary | ICD-10-CM | POA: Diagnosis not present

## 2021-12-29 DIAGNOSIS — Z4689 Encounter for fitting and adjustment of other specified devices: Secondary | ICD-10-CM

## 2021-12-29 NOTE — Progress Notes (Signed)
   Established Patient Office Visit  Subjective   Patient ID: Gina [name], female    DOB: [birth year]  Age: 88 y.o. MRN: [id number]  Chief Complaint  Patient presents with   Pessary Check    HPI  88 yo widowed P3 is here for pessary maintenance. She has no complaints.   ROS She is abstinent for several years. She lives at [facility], stays active. Objective:     BP 121/67   Pulse 88   Ht '5\' 4"'$  (1.626 m)   Wt 153 lb (69.4 kg)   BMI 26.26 kg/m    Physical Exam  Well nourished, well hydrated White female, no apparent distress She looks at least 10 years younger than her stated age, very smart. She is conversing normally. She uses a walker to ambulate. Vulva- moderate atrophy I removed her ring with support pessary. I inspected the vagina with a speculum and noted an excoriation with a little blood at the apex (towards her right).   The ASCVD Risk score (Arnett DK, et al., 2019) failed to calculate for the following reasons:   The 2019 ASCVD risk score is only valid for ages 5 to 20    Assessment & Plan:   Problem List Items Addressed This Visit     Uterine prolapse - Primary   Cystocele with prolapse  Due to the vaginal cuff excoriation, I have left the pessary out. She will come back in a week for re examination. She will bring her pessary with her to that visit.  Return in about 1 week (around 01/05/2022).    Gina Filbert, MD

## 2022-01-05 ENCOUNTER — Encounter: Payer: Self-pay | Admitting: Obstetrics & Gynecology

## 2022-01-05 ENCOUNTER — Other Ambulatory Visit: Payer: Self-pay | Admitting: Obstetrics & Gynecology

## 2022-01-05 ENCOUNTER — Ambulatory Visit: Payer: Medicare HMO | Admitting: Obstetrics & Gynecology

## 2022-01-05 VITALS — BP 120/70 | Ht 64.0 in | Wt 152.0 lb

## 2022-01-05 DIAGNOSIS — N814 Uterovaginal prolapse, unspecified: Secondary | ICD-10-CM | POA: Diagnosis not present

## 2022-01-05 DIAGNOSIS — Z4689 Encounter for fitting and adjustment of other specified devices: Secondary | ICD-10-CM | POA: Diagnosis not present

## 2022-01-05 MED ORDER — ESTRADIOL 7.5 MCG/24HR VA RING: 1.0000 | VAGINAL_RING | VAGINAL | 4 refills | Status: DC

## 2022-01-05 NOTE — Progress Notes (Signed)
   Established Patient Office Visit  Subjective   Patient ID: Kennedi Lizardo, female    DOB: [birth year]  Age: 88 y.o. MRN: [id number]  Chief Complaint  Patient presents with   Follow-up    Pessary     HPI   88 yo widowed P3 is here for a re check of her vaginal excoriation found last week at her pessary maintenance visit. She reports that she did well without the pessary, no significant symptoms of prolapse. She says that she is not ready to discontinue using the pessary as she felt some anxiety without it.  Objective:     BP 120/70   Ht '5\' 4"'$  (1.626 m)   Wt 152 lb (68.9 kg)   BMI 26.09 kg/m   Physical Exam    Well nourished, well hydrated White female, no apparent distress She looks at least 10 years younger than her stated age, very smart. She is conversing normally. She uses a walker to ambulate.  Spec exam reveals a Grade 1/2 cystocele The excoriation is resolved. Marked atrophy is noted again.  Assessment & Plan:   Problem List Items Addressed This Visit   None   Uterine prolpase and mild cystocele Marked atrophy  I replaced the pessary after she declines leaving it out. I rec'd estring to be placed at her next pessary cleaning in 3 months to help prevent future excoriations.   Emily Filbert, MD

## 2022-01-07 ENCOUNTER — Telehealth: Payer: Self-pay

## 2022-01-07 NOTE — Telephone Encounter (Signed)
Mateo Flow at [facility] calling on behalf of pt; states pt c/o urine leakage today; what to do?  [phone]

## 2022-01-09 ENCOUNTER — Ambulatory Visit: Payer: Self-pay | Admitting: *Deleted

## 2022-01-09 NOTE — Telephone Encounter (Signed)
Summary: COVID Dx - Antiviral medication   Caller would like to know if PCP would prescribe anti viral medication due to patient rapid COVID test coming back positive today. Patient experiencing coughing, congestion, fatigue.    [place], [facility] [address], [place], [place]  [phone]         Attempted to call nurse- Lattie Haw- call routed to message board. Attempted to call patient- left message to call office. Call to office- patient has to be seen for Rx.  Call to [facility] -redirected again to message board- no answer and number option did not work.

## 2022-01-09 NOTE — Telephone Encounter (Signed)
Summary: COVID Dx - Antiviral medication   Caller would like to know if PCP would prescribe anti viral medication due to patient rapid COVID test coming back positive today. Patient experiencing coughing, congestion, fatigue.    [facility], [facility] [address], [place], [place]  [phone]       Chief Complaint: covid positive rapid test today per Lattie Haw, RN at [facility] Symptoms: cough , congestion, headache, fatigue, fever 99.1 Frequency: tested today  Pertinent Negatives: Patient denies na  Disposition: '[]'$ ED /'[]'$ Urgent Care (no appt availability in office) / '[]'$ Appointment(In office/virtual)/ '[]'$  [facility]/ '[]'$ Home Care/ '[]'$ Refused Recommended Disposition /'[]'$ [facility]/ '[x]'$  Follow-up with PCP Additional Notes:    No available OV or My chart VV today . Recommended patient needs appt. [facility] facility requesting antiviral medication. Please advise . Can call Harriette Bouillon [phone].     Reason for Disposition  [1] HIGH RISK patient (e.g., weak immune system, age > 54 years, obesity with BMI 30 or higher, pregnant, chronic lung disease or other chronic medical condition) AND [2] COVID symptoms (e.g., cough, fever)  (Exceptions: Already seen by PCP and no new or worsening symptoms.)  Answer Assessment - Initial Assessment Questions 1. COVID-19 DIAGNOSIS: "How do you know that you have COVID?" (e.g., positive lab test or self-test, diagnosed by doctor or NP/PA, symptoms after exposure).     Positive rapid covid test today per Harriette Bouillon from [facility]  2. COVID-19 EXPOSURE: "Was there any known exposure to COVID before the symptoms began?" CDC Definition of close contact: within 6 feet (2 meters) for a total of 15 minutes or more over a 24-hour period.      na 3. ONSET: "When did the COVID-19 symptoms start?"      Na  4. WORST SYMPTOM: "What is your worst symptom?" (e.g., cough, fever, shortness of breath, muscle aches)      Cough, congestion, fatigue, headache temp 99.1 5. COUGH: "Do you have a cough?" If Yes, ask: "How bad is the cough?"       Yes  6. FEVER: "Do you have a fever?" If Yes, ask: "What is your temperature, how was it measured, and when did it start?"     99.1 7. RESPIRATORY STATUS: "Describe your breathing?" (e.g., normal; shortness of breath, wheezing, unable to speak)      na 8. BETTER-SAME-WORSE: "Are you getting better, staying the same or getting worse compared to yesterday?"  If getting worse, ask, "In what way?"     na 9. OTHER SYMPTOMS: "Do you have any other symptoms?"  (e.g., chills, fatigue, headache, loss of smell or taste, muscle pain, sore throat)     Headache , fatigue cough congestion fever 10. HIGH RISK DISEASE: "Do you have any chronic medical problems?" (e.g., asthma, heart or lung disease, weak immune system, obesity, etc.)       See hx  11. VACCINE: "Have you had the COVID-19 vaccine?" If Yes, ask: "Which one, how many shots, when did you get it?"       na 12. PREGNANCY: "Is there any chance you are pregnant?" "When was your last menstrual period?"       na 13. O2 SATURATION MONITOR:  "Do you use an oxygen saturation monitor (pulse oximeter) at home?" If Yes, ask "What is your reading (oxygen level) today?" "What is your usual oxygen saturation reading?" (e.g., 95%)       na  Protocols used: Coronavirus (COVID-19) Diagnosed or Suspected-A-AH

## 2022-01-09 NOTE — Telephone Encounter (Signed)
FYI

## 2022-01-14 NOTE — Telephone Encounter (Signed)
Copied from [facility] [id number]. Topic: General - Other >> Jan 14, 2022  3:23 PM Cyndi Bender wrote: Reason for CRM: Dionne Milo with [facility] reports missed PT visit. Pt is on Covid quarantine at assisted living. Quarantine time is up on Saturday 01/17/22. Cb# [id number]

## 2022-01-19 NOTE — Telephone Encounter (Signed)
I have tried numerous times to contact the number and not able to get through. Every time I push a number option it does not work

## 2022-01-24 ENCOUNTER — Other Ambulatory Visit: Payer: Self-pay | Admitting: Family Medicine

## 2022-01-24 DIAGNOSIS — E782 Mixed hyperlipidemia: Secondary | ICD-10-CM

## 2022-01-28 ENCOUNTER — Inpatient Hospital Stay: Payer: Medicare HMO | Attending: Oncology

## 2022-01-28 DIAGNOSIS — D473 Essential (hemorrhagic) thrombocythemia: Secondary | ICD-10-CM | POA: Diagnosis not present

## 2022-01-28 LAB — COMPREHENSIVE METABOLIC PANEL
ALT: 14 U/L (ref 0–44)
AST: 20 U/L (ref 15–41)
Albumin: 4.1 g/dL (ref 3.5–5.0)
Alkaline Phosphatase: 84 U/L (ref 38–126)
Anion gap: 9 (ref 5–15)
BUN: 31 mg/dL — ABNORMAL HIGH (ref 8–23)
CO2: 25 mmol/L (ref 22–32)
Calcium: 9.1 mg/dL (ref 8.9–10.3)
Chloride: 98 mmol/L (ref 98–111)
Creatinine, Ser: 1.06 mg/dL — ABNORMAL HIGH (ref 0.44–1.00)
GFR, Estimated: 49 mL/min — ABNORMAL LOW (ref >60)
Glucose, Bld: 119 mg/dL — ABNORMAL HIGH (ref 70–99)
Potassium: 4.6 mmol/L (ref 3.5–5.1)
Sodium: 132 mmol/L — ABNORMAL LOW (ref 135–145)
Total Bilirubin: 1.3 mg/dL — ABNORMAL HIGH (ref 0.3–1.2)
Total Protein: 7.7 g/dL (ref 6.5–8.1)

## 2022-01-28 LAB — CBC WITH DIFFERENTIAL/PLATELET
Abs Immature Granulocytes: 0.06 10*3/uL (ref 0.00–0.07)
Basophils Absolute: 0.1 10*3/uL (ref 0.0–0.1)
Basophils Relative: 1 %
Eosinophils Absolute: 0.1 10*3/uL (ref 0.0–0.5)
Eosinophils Relative: 2 %
HCT: 40.3 % (ref 36.0–46.0)
Hemoglobin: 13.3 g/dL (ref 12.0–15.0)
Immature Granulocytes: 1 %
Lymphocytes Relative: 15 %
Lymphs Abs: 1.3 10*3/uL (ref 0.7–4.0)
MCH: 31.1 pg (ref 26.0–34.0)
MCHC: 33 g/dL (ref 30.0–36.0)
MCV: 94.2 fL (ref 80.0–100.0)
Monocytes Absolute: 0.9 10*3/uL (ref 0.1–1.0)
Monocytes Relative: 10 %
Neutro Abs: 6.3 10*3/uL (ref 1.7–7.7)
Neutrophils Relative %: 71 %
Platelets: 512 10*3/uL — ABNORMAL HIGH (ref 150–400)
RBC: 4.28 MIL/uL (ref 3.87–5.11)
RDW: 14 % (ref 11.5–15.5)
WBC: 8.7 10*3/uL (ref 4.0–10.5)
nRBC: 0 % (ref 0.0–0.2)

## 2022-02-11 ENCOUNTER — Ambulatory Visit: Payer: Medicare HMO | Admitting: Dermatology

## 2022-02-11 DIAGNOSIS — L723 Sebaceous cyst: Secondary | ICD-10-CM

## 2022-02-11 DIAGNOSIS — L989 Disorder of the skin and subcutaneous tissue, unspecified: Secondary | ICD-10-CM

## 2022-02-11 MED ORDER — DOXYCYCLINE HYCLATE 100 MG PO CAPS: 100.0000 mg | ORAL_CAPSULE | Freq: Two times a day (BID) | ORAL | 0 refills | Status: AC

## 2022-02-11 MED ORDER — MUPIROCIN 2 % EX OINT: TOPICAL_OINTMENT | CUTANEOUS | 0 refills | Status: DC

## 2022-02-11 NOTE — Patient Instructions (Addendum)
Start doxycycline 100 MG take 1 capsule by mouth twice daily with food x 2 weeks. Doxycycline should be taken with food to prevent nausea. Do not lay down for 30 minutes after taking. Be cautious with sun exposure and use good sun protection while on this medication. Pregnant women should not take this medication.  Start mupirocin 2% ointment Apply to affected area on back with bandage changes until healed.   Due to recent changes in healthcare laws, you may see results of your pathology and/or laboratory studies on MyChart before the doctors have had a chance to review them. We understand that in some cases there may be results that are confusing or concerning to you. Please understand that not all results are received at the same time and often the doctors may need to interpret multiple results in order to provide you with the best plan of care or course of treatment. Therefore, we ask that you please give [place] 2 business days to thoroughly review all your results before contacting the office for clarification. Should we see a critical lab result, you will be contacted sooner.   If You Need Anything After Your Visit  If you have any questions or concerns for your doctor, please call our main line at [phone] and press option 4 to reach your doctor's medical assistant. If no one answers, please leave a voicemail as directed and we will return your call as soon as possible. Messages left after 4 pm will be answered the following business day.   You may also send [place] a message via [place]. We typically respond to MyChart messages within 1-2 business days.  For prescription refills, please ask your pharmacy to contact our office. Our fax number is [phone].  If you have an urgent issue when the clinic is closed that cannot wait until the next business day, you can page your doctor at the number below.    Please note that while we do our best to be available for urgent issues outside of office  hours, we are not available 24/7.   If you have an urgent issue and are unable to reach [place], you may choose to seek medical care at your doctor's office, retail clinic, urgent care center, or emergency room.  If you have a medical emergency, please immediately call 911 or go to the emergency department.  Pager Numbers  - Dr. Nehemiah Massed: [phone]  - Dr. Laurence Ferrari: [phone]  - Dr. Nicole Kindred: [phone]  In the event of inclement weather, please call our main line at [phone] for an update on the status of any delays or closures.  Dermatology Medication Tips: Please keep the boxes that topical medications come in in order to help keep track of the instructions about where and how to use these. Pharmacies typically print the medication instructions only on the boxes and not directly on the medication tubes.   If your medication is too expensive, please contact our office at [phone] option 4 or send [place] a message through [place].   We are unable to tell what your co-pay for medications will be in advance as this is different depending on your insurance coverage. However, we may be able to find a substitute medication at lower cost or fill out paperwork to get insurance to cover a needed medication.   If a prior authorization is required to get your medication covered by your insurance company, please allow [place] 1-2 business days to complete this process.  Drug prices often vary depending on  where the prescription is filled and some pharmacies may offer cheaper prices.  The website www.goodrx.com contains coupons for medications through different pharmacies. The prices here do not account for what the cost may be with help from insurance (it may be cheaper with your insurance), but the website can give you the price if you did not use any insurance.  - You can print the associated coupon and take it with your prescription to the pharmacy.  - You may also stop by our office during regular  business hours and pick up a GoodRx coupon card.  - If you need your prescription sent electronically to a different pharmacy, notify our office through [facility] or by phone at [phone] option 4.     Si Usted Necesita Algo Despus de Su Visita  Tambin puede enviarnos un mensaje a travs de Pharmacist, community. Por lo general respondemos a los mensajes de MyChart en el transcurso de 1 a 2 das hbiles.  Para renovar recetas, por favor pida a su farmacia que se ponga en contacto con nuestra oficina. Harland Dingwall de fax es [place] [phone].  Si tiene un asunto urgente cuando la clnica est cerrada y que no puede esperar hasta el siguiente da hbil, puede llamar/localizar a su doctor(a) al nmero que aparece a continuacin.   Por favor, tenga en cuenta que aunque hacemos todo lo posible para estar disponibles para asuntos urgentes fuera del horario de [place], no estamos disponibles las 24 horas del da, los 7 das de la [place].   Si tiene un problema urgente y no puede comunicarse con nosotros, puede optar por buscar atencin mdica  en el consultorio de su doctor(a), en una clnica privada, en un centro de atencin urgente o en una sala de emergencias.  Si tiene Engineering geologist, por favor llame inmediatamente al 911 o vaya a la sala de emergencias.  Nmeros de bper  - Dr. Nehemiah Massed: [phone]  - Dra. Moye: [phone]  - Dra. Nicole Kindred: [phone]  En caso de inclemencias del [place], por favor llame a Johnsie Kindred principal al [phone] para una actualizacin sobre el [place] de cualquier retraso o cierre.  Consejos para la medicacin en dermatologa: Por favor, guarde las cajas en las que vienen los medicamentos de uso tpico para ayudarle a seguir las instrucciones sobre dnde y cmo usarlos. Las farmacias generalmente imprimen las instrucciones del medicamento slo en las cajas y no directamente en los tubos del [place].   Si su medicamento es muy caro, por  favor, pngase en contacto con Zigmund Daniel llamando al [phone] y presione la opcin 4 o envenos un mensaje a travs de Pharmacist, community.   No podemos decirle cul ser su copago por los medicamentos por adelantado ya que esto es diferente dependiendo de la cobertura de su seguro. Sin embargo, es posible que podamos encontrar un medicamento sustituto a Electrical engineer un formulario para que el seguro cubra el medicamento que se considera necesario.   Si se requiere una autorizacin previa para que su compaa de seguros [place] su medicamento, por favor permtanos de 1 a 2 das hbiles para completar este proceso.  Los precios de los medicamentos varan con frecuencia dependiendo del Environmental consultant de dnde se surte la receta y alguna farmacias pueden ofrecer precios ms baratos.  El sitio web www.goodrx.com tiene cupones para medicamentos de Airline pilot. Los precios aqu no tienen en cuenta lo que podra costar con la ayuda del seguro (puede ser ms barato con su seguro), [employer]  sitio web puede darle el precio si no Field seismologist.  - Puede imprimir el cupn correspondiente y llevarlo con su receta a la farmacia.  - Tambin puede pasar por nuestra oficina durante el horario de atencin regular y Charity fundraiser una tarjeta de cupones de GoodRx.  - Si necesita que su receta se enve electrnicamente a una farmacia diferente, informe a nuestra oficina a travs de MyChart de [place] o por telfono llamando al [phone] y presione la opcin 4.

## 2022-02-11 NOTE — Progress Notes (Signed)
   Follow-Up Visit   Subjective  Gina [name] is a 88 y.o. female who presents for the following: Spot of concern (Back, noticed recently, oozes.). Painful.   The following portions of the chart were reviewed this encounter and updated as appropriate:       Review of Systems:  No other skin or systemic complaints except as noted in HPI or Assessment and Plan.  Objective  Well appearing patient in no apparent distress; mood and affect are within normal limits.  A focused examination was performed including face, back. Relevant physical exam findings are noted in the Assessment and Plan.  Spinal Mid Back Subcutaneous papule/nodule with erythema and edema, tender to touch.     Assessment & Plan  Inflamed epidermoid cyst of skin Spinal Mid Back  Start mupirocin 2% ointment Apply to AA back BID until healed Start doxycycline '100mg'$  BID with food x 2 weeks dsp #28 0Rf Doxycycline should be taken with food to prevent nausea. Do not lay down for 30 minutes after taking. Be cautious with sun exposure and use good sun protection while on this medication. Pregnant women should not take this medication.   I&D performed today.  Incision and Drainage - Spinal Mid Back Location: spinal mid back  Informed Consent: Discussed risks (permanent scarring, light or dark discoloration, infection, pain, bleeding, bruising, redness, damage to adjacent structures, and recurrence of the lesion) and benefits of the procedure, as well as the alternatives.  Informed consent was obtained.  Preparation: The area was prepped with alcohol.  Anesthesia: Lidocaine 1% with epinephrine  Procedure Details: An incision was made overlying the lesion. The lesion drained pus, blood, and keratin.  A small amount of fluid was drained.    Antibiotic ointment and a sterile pressure dressing were applied. The patient tolerated procedure well.  Total number of lesions drained: 1  Plan: The patient was instructed on  post-op care. Recommend OTC analgesia as needed for pain.   mupirocin ointment (BACTROBAN) 2 % - Spinal Mid Back Apply to affected area on back with bandage changes until healed.  doxycycline (VIBRAMYCIN) 100 MG capsule - Spinal Mid Back Take 1 capsule (100 mg total) by mouth 2 (two) times daily for 14 days. Take with food.   Return if symptoms worsen or fail to improve.  IJamesetta Orleans, CMA, am acting as scribe for Brendolyn Patty, MD .  Documentation: I have reviewed the above documentation for accuracy and completeness, and I agree with the above.  Brendolyn Patty MD

## 2022-03-08 ENCOUNTER — Other Ambulatory Visit: Payer: Self-pay | Admitting: Internal Medicine

## 2022-03-08 DIAGNOSIS — E890 Postprocedural hypothyroidism: Secondary | ICD-10-CM

## 2022-03-09 ENCOUNTER — Other Ambulatory Visit: Payer: Self-pay | Admitting: Family Medicine

## 2022-03-09 DIAGNOSIS — I10 Essential (primary) hypertension: Secondary | ICD-10-CM

## 2022-03-09 NOTE — Telephone Encounter (Signed)
Requested Prescriptions  Pending Prescriptions Disp Refills   levothyroxine (SYNTHROID) 88 MCG tablet [Pharmacy Med Name: Levothyroxine Sodium 88 MCG Oral Tablet] 90 tablet 2    Sig: TAKE 1 TABLET BY MOUTH ONCE DAILY BEFORE BREAKFAST     Endocrinology:  Hypothyroid Agents Passed - 03/08/2022  1:14 PM      Passed - TSH in normal range and within 360 days    TSH  Date Value Ref Range Status  12/22/2021 2.91 0.40 - 4.50 mIU/L Final         Passed - Valid encounter within last 12 months    Recent Outpatient Visits           2 months ago Essential hypertension   [facility] Delsa Grana, PA-C   8 months ago Postoperative hypothyroidism   [place], DO   1 year ago Hypertension, unspecified type   [facility] Teodora Medici, DO   1 year ago Lumbar radiculopathy   [facility] Kathrine Haddock, NP   1 year ago Essential hypertension   [facility] Delsa Grana, PA-C       Future Appointments             In 3 months Brendolyn Patty, MD [place]   In 3 months Delsa Grana, PA-C [facility], [facility]

## 2022-03-30 ENCOUNTER — Ambulatory Visit: Payer: Medicare HMO | Admitting: Obstetrics & Gynecology

## 2022-03-30 ENCOUNTER — Encounter: Payer: Self-pay | Admitting: Obstetrics & Gynecology

## 2022-03-30 VITALS — BP 120/70 | Ht 64.0 in | Wt 150.0 lb

## 2022-03-30 DIAGNOSIS — N814 Uterovaginal prolapse, unspecified: Secondary | ICD-10-CM

## 2022-03-30 DIAGNOSIS — Z4689 Encounter for fitting and adjustment of other specified devices: Secondary | ICD-10-CM | POA: Diagnosis not present

## 2022-03-30 DIAGNOSIS — S30814A Abrasion of vagina and vulva, initial encounter: Secondary | ICD-10-CM

## 2022-03-30 DIAGNOSIS — N952 Postmenopausal atrophic vaginitis: Secondary | ICD-10-CM

## 2022-03-30 MED ORDER — ESTRING 2 MG VA RING: 1.0000 | VAGINAL_RING | VAGINAL | 4 refills | Status: DC

## 2022-03-30 NOTE — Progress Notes (Unsigned)
   Established Patient Office Visit  Subjective   Patient ID: Gina [name], female    DOB: Apr 04, 1926  Age: 89 y.o. MRN: [id number]  Chief Complaint  Patient presents with   Pessary Check    HPI   89 yo widowed P3 is here for pessary maintenance. She denies any problems with it other than an odor. She says "I think it needs cleaned". She reports normal bowel and bladder habits.  Objective:     BP 120/70   Ht '5\' 4"'$  (1.626 m)   Wt 150 lb (68 kg)   BMI 25.75 kg/m  {Vitals History (Optional):23777}  Physical Exam   Well nourished, well hydrated White female, no apparent distress She is ambulating with a walker and conversing normally. EG- significant VVA I removed her pessary and cleaned it. I examined the vaginal cuff with a Graves speculum and noted the return of vaginal excoriations. I noted this at her visit 12/29/2021.    Assessment & Plan:   Problem List Items Addressed This Visit     Cystocele with prolapse - Primary   Other Visit Diagnoses     Pessary maintenance       Abrasion of vagina, initial encounter          She will leave the pessary out for a week (as she did in November) and come back for reinsertion. I have rec'd that she use Estring at the same time as the pessary. I prescribed this.    Emily Filbert, MD

## 2022-04-03 ENCOUNTER — Telehealth: Payer: Self-pay

## 2022-04-03 NOTE — Telephone Encounter (Signed)
She sees you for pessary maintenance. You had suggested that she use Estring Ring. Patient has not started using it because of the cost. It will cost her $300, she does not want to pay that much for it. She has an appointment on Tuesday. She would like her pessary put back in. She would like to have an alternate medication to the [place].

## 2022-04-06 ENCOUNTER — Ambulatory Visit: Payer: Medicare HMO | Admitting: Obstetrics & Gynecology

## 2022-04-06 ENCOUNTER — Encounter: Payer: Self-pay | Admitting: Obstetrics & Gynecology

## 2022-04-06 VITALS — BP 122/70 | Ht 64.0 in | Wt 152.0 lb

## 2022-04-06 DIAGNOSIS — Z4689 Encounter for fitting and adjustment of other specified devices: Secondary | ICD-10-CM | POA: Diagnosis not present

## 2022-04-06 DIAGNOSIS — N814 Uterovaginal prolapse, unspecified: Secondary | ICD-10-CM

## 2022-04-06 MED ORDER — IMVEXXY STARTER PACK 4 MCG VA INST: 4.0000 ug | VAGINAL_INSERT | VAGINAL | 0 refills | Status: AC

## 2022-04-06 NOTE — Progress Notes (Signed)
   Established Patient Office Visit  Subjective   Patient ID: Gina [name], female    DOB: 08/17/1926  Age: 89 y.o. MRN: [id number]  Chief Complaint  Patient presents with   Pessary Check    HPI   89 yo widowed P3 here for a recheck of her vaginal cuff. She wears a #3 ring with support for vaginal cuff prolapse/cystocele. She did fine without her pessary for the most part over the last week. She does feel the bladder "pulling" without the pessary. She was able to do all of her ADLs.  I prescribed a Estring but it was going to cost her $300, so she didn't get it.  Objective:     BP 122/70   Ht '5\' 4"'$  (1.626 m)   Wt 152 lb (68.9 kg)   BMI 26.09 kg/m    Physical Exam     Well nourished, well hydrated White female, no apparent distress She is ambulating with a walker and conversing normally. EG- significant VVA Her prolapse is relatively mild. I placed a spec and noted only VVA, no more excoriations. I placed 2 '4mg'$  tabs of Imvexxy prior to inserting the pessary.  Assessment & Plan:   Cystocele with prolapse- she will use the pessary for the next 2 months and then decide if she really needs it. Emily Filbert, MD

## 2022-04-27 ENCOUNTER — Other Ambulatory Visit: Payer: Self-pay | Admitting: Family Medicine

## 2022-04-27 DIAGNOSIS — E782 Mixed hyperlipidemia: Secondary | ICD-10-CM

## 2022-04-29 ENCOUNTER — Encounter: Payer: Self-pay | Admitting: Oncology

## 2022-04-29 ENCOUNTER — Inpatient Hospital Stay: Payer: Medicare HMO | Attending: Oncology | Admitting: Oncology

## 2022-04-29 ENCOUNTER — Inpatient Hospital Stay: Payer: Medicare HMO

## 2022-04-29 VITALS — BP 133/60 | HR 82 | Temp 96.7°F | Resp 16 | Ht 64.0 in | Wt 151.0 lb

## 2022-04-29 DIAGNOSIS — D473 Essential (hemorrhagic) thrombocythemia: Secondary | ICD-10-CM | POA: Insufficient documentation

## 2022-04-29 DIAGNOSIS — Z79899 Other long term (current) drug therapy: Secondary | ICD-10-CM | POA: Insufficient documentation

## 2022-04-29 DIAGNOSIS — N289 Disorder of kidney and ureter, unspecified: Secondary | ICD-10-CM | POA: Insufficient documentation

## 2022-04-29 LAB — COMPREHENSIVE METABOLIC PANEL
ALT: 15 U/L (ref 0–44)
AST: 22 U/L (ref 15–41)
Albumin: 4.2 g/dL (ref 3.5–5.0)
Alkaline Phosphatase: 89 U/L (ref 38–126)
Anion gap: 9 (ref 5–15)
BUN: 29 mg/dL — ABNORMAL HIGH (ref 8–23)
CO2: 24 mmol/L (ref 22–32)
Calcium: 9.4 mg/dL (ref 8.9–10.3)
Chloride: 103 mmol/L (ref 98–111)
Creatinine, Ser: 1.11 mg/dL — ABNORMAL HIGH (ref 0.44–1.00)
GFR, Estimated: 46 mL/min — ABNORMAL LOW (ref >60)
Glucose, Bld: 148 mg/dL — ABNORMAL HIGH (ref 70–99)
Potassium: 4.5 mmol/L (ref 3.5–5.1)
Sodium: 136 mmol/L (ref 135–145)
Total Bilirubin: 1.2 mg/dL (ref 0.3–1.2)
Total Protein: 7.6 g/dL (ref 6.5–8.1)

## 2022-04-29 LAB — CBC WITH DIFFERENTIAL/PLATELET
Abs Immature Granulocytes: 0.05 10*3/uL (ref 0.00–0.07)
Basophils Absolute: 0.1 10*3/uL (ref 0.0–0.1)
Basophils Relative: 1 %
Eosinophils Absolute: 0.2 10*3/uL (ref 0.0–0.5)
Eosinophils Relative: 2 %
HCT: 41 % (ref 36.0–46.0)
Hemoglobin: 13.6 g/dL (ref 12.0–15.0)
Immature Granulocytes: 1 %
Lymphocytes Relative: 14 %
Lymphs Abs: 1.3 10*3/uL (ref 0.7–4.0)
MCH: 31.6 pg (ref 26.0–34.0)
MCHC: 33.2 g/dL (ref 30.0–36.0)
MCV: 95.3 fL (ref 80.0–100.0)
Monocytes Absolute: 0.8 10*3/uL (ref 0.1–1.0)
Monocytes Relative: 8 %
Neutro Abs: 6.7 10*3/uL (ref 1.7–7.7)
Neutrophils Relative %: 74 %
Platelets: 472 10*3/uL — ABNORMAL HIGH (ref 150–400)
RBC: 4.3 MIL/uL (ref 3.87–5.11)
RDW: 14.5 % (ref 11.5–15.5)
WBC: 9 10*3/uL (ref 4.0–10.5)
nRBC: 0 % (ref 0.0–0.2)

## 2022-04-29 NOTE — Progress Notes (Signed)
Mentioned extreme coldness feeling in hands and feet.

## 2022-04-29 NOTE — Progress Notes (Signed)
[place]  [phone] [phone]  ID: [name] OB: [id number]  MR#: [id number]  [id number]  Patient Care Team: Delsa Grana, PA-C as PCP - General (Family Medicine) Murlean Iba, MD (Internal Medicine) Gae Dry, MD as Referring Physician (Obstetrics and Gynecology) Estill Cotta, MD (Ophthalmology) Anabel Bene, MD as Referring Physician (Neurology) Beverly Gust, MD (Otolaryngology) Brendolyn Patty, MD (Dermatology) Lloyd Huger, MD as Consulting Physician (Oncology)  CHIEF COMPLAINT: Essential thrombocytosis  INTERVAL HISTORY: Patient returns to clinic today for repeat laboratory work and routine 55-monthevaluation.  She continues to tolerate Hydrea well without significant side effects.  She currently feels well and is asymptomatic. She has no neurologic complaints.  She denies any recent fevers or illnesses.  She has a good appetite and denies weight loss.  She denies any chest pain, shortness of breath, cough, or hemoptysis.  She denies any nausea, vomiting, constipation, or diarrhea.  She has no urinary complaints.  Patient offers no specific complaints today.  REVIEW OF SYSTEMS:   Review of Systems  Constitutional: Negative.  Negative for fever, malaise/fatigue and weight loss.  Respiratory: Negative.  Negative for cough, hemoptysis and shortness of breath.   Cardiovascular: Negative.  Negative for chest pain and leg swelling.  Gastrointestinal: Negative.  Negative for abdominal pain.  Genitourinary: Negative.  Negative for dysuria.  Musculoskeletal: Negative.  Negative for back pain.  Skin: Negative.  Negative for rash.  Neurological: Negative.  Negative for focal weakness, weakness and headaches.  Psychiatric/Behavioral: Negative.  The patient is not nervous/anxious.     As per HPI. Otherwise, a complete review of systems is negative.  PAST MEDICAL HISTORY: Past Medical History:  Diagnosis  Date   Breast mass in female    right breast   Cataract    Difficult intubation 03/03/2016   September 06, 2013; left nasal fiberoptic intubation #7 ETT; 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 CMike Gip  Hemorrhoids 07/29/2016   Hyperlipemia    Hyperlipidemia    history of    Hypothyroidism    Mild atherosclerosis of carotid artery 06/25/2015   Morton's neuroma    MRI contraindicated due to metal implant    Prolapse of uterus    Radiculopathy    Renal cyst    Rosacea    Shingles    Thrombocytosis     PAST SURGICAL HISTORY: Past Surgical History:  Procedure Laterality Date   ADENOIDECTOMY     APPENDECTOMY     biopsy, right breast      CATARACT EXTRACTION W/PHACO Left 03/25/2015   Procedure: CATARACT EXTRACTION PHACO AND INTRAOCULAR LENS PLACEMENT ([place];  Surgeon: SEstill Cotta MD;  Location: [facility] ORS;  Service: Ophthalmology;  Laterality: Left;  [place]02:09 AP% 27.3 CDE 59.97 fluid pack lot #[id number]   CESAREAN SECTION     COLON SURGERY     JOINT REPLACEMENT Right    total hip   THYROID LOBECTOMY     THYROIDECTOMY     partial   TONSILLECTOMY     TOTAL HIP ARTHROPLASTY     UPPER GI ENDOSCOPY  10/17/2008    FAMILY HISTORY: Family History  Problem Relation Age of Onset   Stroke Mother    Stroke Father    Dementia Maternal Grandfather  ADVANCED DIRECTIVES (Y/N):  N  HEALTH MAINTENANCE: Social History   Tobacco Use   Smoking status: Former   Smokeless tobacco: Never  Scientific laboratory technician Use: Never used  Substance Use Topics   Alcohol use: No    Alcohol/week: 0.0 standard drinks of alcohol   Drug use: No     Colonoscopy:  PAP:  Bone density:  Lipid panel:  Allergies  Allergen Reactions   Novocain [Procaine] Palpitations    Current  Outpatient Medications  Medication Sig Dispense Refill   Acetaminophen 500 MG coapsule Take by mouth every 4 (four) hours as needed for fever.     amLODipine (NORVASC) 5 MG tablet Take 1 tablet by mouth once daily 90 tablet 0   aspirin EC 81 MG tablet Take 81 mg by mouth daily.      atorvastatin (LIPITOR) 20 MG tablet TAKE 1 TABLET BY MOUTH AT BEDTIME 90 tablet 0   Cholecalciferol (VITAMIN D3) 1000 units CAPS Take by mouth daily.      Cyanocobalamin (B-12) 500 MCG TABS Take 500 mcg by mouth daily. 30 tablet 5   estradiol (ESTRING) 7.5 MCG/24HR vaginal ring Place 1 each vaginally every 3 (three) months. follow package directions 1 each 4   estradiol (ESTRING) 7.5 MCG/24HR vaginal ring Place 1 each vaginally every 3 (three) months. follow package directions 1 each 4   famotidine (PEPCID) 20 MG tablet Take one tab once to twice daily as needed 60 tablet 5   hydroxyurea (HYDREA) 500 MG capsule TAKE 1 CAPSULE BY MOUTH 4 DAYS A WEEK 60 capsule 2   irbesartan (AVAPRO) 150 MG tablet Take 1 tablet (150 mg total) by mouth daily. 90 tablet 3   ketoconazole (NIZORAL) 2 % shampoo Apply 1 application. topically 3 (three) times a week. Wash scalp 3 times weekly, let sit 5 minutes and rinse out 120 mL 11   levothyroxine (SYNTHROID) 88 MCG tablet TAKE 1 TABLET BY MOUTH ONCE DAILY BEFORE BREAKFAST 90 tablet 2   mupirocin ointment (BACTROBAN) 2 % Apply to affected area on back with bandage changes until healed. 22 g 0   nystatin ointment (MYCOSTATIN) Apply 1 application topically 2 (two) times daily. 30 g 0   Polyethyl Glycol-Propyl Glycol (SYSTANE) 0.4-0.3 % SOLN Apply to eye.     No current facility-administered medications for this visit.    OBJECTIVE: Vitals:   04/29/22 1007  BP: 133/60  Pulse: 82  Resp: 16  Temp: (!) 96.7 F (35.9 C)  SpO2: 98%     Body mass index is 25.92 kg/m.    ECOG FS:0 - Asymptomatic  General: Well-developed, well-nourished, no acute distress. Eyes: Pink conjunctiva,  anicteric sclera. HEENT: Normocephalic, moist mucous membranes. Lungs: No audible wheezing or coughing. Heart: Regular rate and rhythm. Abdomen: Soft, nontender, no obvious distention. Musculoskeletal: No edema, cyanosis, or clubbing. Neuro: Alert, answering all questions appropriately. Cranial nerves grossly intact. Skin: No rashes or petechiae noted. Psych: Normal affect.  LAB RESULTS:  Lab Results  Component Value Date   NA 136 04/29/2022   K 4.5 04/29/2022   CL 103 04/29/2022   CO2 24 04/29/2022   GLUCOSE 148 (H) 04/29/2022   BUN 29 (H) 04/29/2022   CREATININE 1.11 (H) 04/29/2022   CALCIUM 9.4 04/29/2022   PROT 7.6 04/29/2022   ALBUMIN 4.2 04/29/2022   AST 22 04/29/2022   ALT 15 04/29/2022   ALKPHOS 89 04/29/2022   BILITOT 1.2 04/29/2022   GFRNONAA 46 (L) 04/29/2022   GFRAA 43 (  L) 08/12/2020    Lab Results  Component Value Date   WBC 9.0 04/29/2022   NEUTROABS 6.7 04/29/2022   HGB 13.6 04/29/2022   HCT 41.0 04/29/2022   MCV 95.3 04/29/2022   PLT 472 (H) 04/29/2022     STUDIES: No results found.  ASSESSMENT: Essential thrombocytosis  PLAN:   Essential thrombocytosis: Chronic and unchanged.  Patient's platelet count is mildly elevated, but essentially stable at 472.  Her counts have ranged from 390-595 since at least July 2020.  Continue current Hydrea regimen of 4 days a week on Monday, Wednesday, Friday, and Saturday. Continue 81 mg of aspirin.  No further interventions are needed.  Return to clinic in 6 months with repeat laboratory work and further evaluation. Renal insufficiency: Chronic and unchanged.  Patient's most recent creatinine is 1.11.   Hyperbilirubinemia: Resolved.  Patient expressed understanding and was in agreement with this plan. She also understands that She can call clinic at any time with any questions, concerns, or complaints.    Lloyd Huger, MD   04/29/2022 12:13 PM

## 2022-05-04 DIAGNOSIS — Z961 Presence of intraocular lens: Secondary | ICD-10-CM | POA: Diagnosis not present

## 2022-05-04 DIAGNOSIS — H2511 Age-related nuclear cataract, right eye: Secondary | ICD-10-CM | POA: Diagnosis not present

## 2022-05-09 ENCOUNTER — Other Ambulatory Visit: Payer: Self-pay | Admitting: Internal Medicine

## 2022-05-09 DIAGNOSIS — K219 Gastro-esophageal reflux disease without esophagitis: Secondary | ICD-10-CM

## 2022-05-11 NOTE — Telephone Encounter (Signed)
Requested Prescriptions  Pending Prescriptions Disp Refills   famotidine (PEPCID) 20 MG tablet [Pharmacy Med Name: Famotidine 20 MG Oral Tablet] 180 tablet 0    Sig: Take 1 tablet by mouth twice daily as needed     Gastroenterology:  H2 Antagonists Passed - 05/09/2022  9:34 AM      Passed - Valid encounter within last 12 months    Recent Outpatient Visits           4 months ago Essential hypertension   [facility] Delsa Grana, PA-C   10 months ago Postoperative hypothyroidism   [facility] Teodora Medici, DO   1 year ago Hypertension, unspecified type   [facility] Teodora Medici, DO   1 year ago Lumbar radiculopathy   [facility] Kathrine Haddock, NP   1 year ago Essential hypertension   [facility] Delsa Grana, PA-C       Future Appointments             In 1 month Brendolyn Patty, MD [place]   In 1 month Delsa Grana, [facility], [facility]

## 2022-05-12 ENCOUNTER — Ambulatory Visit: Payer: Medicare HMO | Admitting: Dermatology

## 2022-06-01 DIAGNOSIS — Z1231 Encounter for screening mammogram for malignant neoplasm of breast: Secondary | ICD-10-CM | POA: Diagnosis not present

## 2022-06-03 ENCOUNTER — Other Ambulatory Visit: Payer: Self-pay | Admitting: Internal Medicine

## 2022-06-03 DIAGNOSIS — I10 Essential (primary) hypertension: Secondary | ICD-10-CM

## 2022-06-04 NOTE — Telephone Encounter (Signed)
Requested Prescriptions  Pending Prescriptions Disp Refills   amLODipine (NORVASC) 5 MG tablet [Pharmacy Med Name: amLODIPine Besylate 5 MG Oral Tablet] 90 tablet 0    Sig: Take 1 tablet by mouth once daily     Cardiovascular: Calcium Channel Blockers 2 Passed - 06/03/2022  6:42 AM      Passed - Last BP in normal range    BP Readings from Last 1 Encounters:  04/29/22 133/60         Passed - Last Heart Rate in normal range    Pulse Readings from Last 1 Encounters:  04/29/22 82         Passed - Valid encounter within last 6 months    Recent Outpatient Visits           5 months ago Essential hypertension   [facility] [name], PA-C   11 months ago Postoperative hypothyroidism   [facility] Margarita Mail, DO   1 year ago Hypertension, unspecified type   [facility] Margarita Mail, DO   1 year ago Lumbar radiculopathy   [facility] Gabriel Cirri, NP   1 year ago Essential hypertension   [facility] [name], PA-C       Future Appointments             In 2 weeks [name], MD [facility]   In 2 weeks [name], PA-C [facility]

## 2022-06-10 ENCOUNTER — Other Ambulatory Visit: Payer: Self-pay | Admitting: Oncology

## 2022-06-10 DIAGNOSIS — D473 Essential (hemorrhagic) thrombocythemia: Secondary | ICD-10-CM

## 2022-06-10 DIAGNOSIS — M1612 Unilateral primary osteoarthritis, left hip: Secondary | ICD-10-CM | POA: Diagnosis not present

## 2022-06-10 DIAGNOSIS — D75839 Thrombocytosis, unspecified: Secondary | ICD-10-CM

## 2022-06-10 NOTE — Telephone Encounter (Signed)
CBC with Differential Order: [id number] Status: Final result     Visible to patient: Yes (not seen)     Next appt: 06/15/2022 at 10:55 AM in Obstetrics and Gynecology Allie Bossier, MD)     Dx: Essential thrombocytosis   0 Result Notes          Component Ref Range & Units 1 mo ago (04/29/22) 4 mo ago (01/28/22) 7 mo ago (10/29/21) 10 mo ago (07/25/21) 1 yr ago (04/23/21) 1 yr ago (01/15/21) 1 yr ago (10/14/20)  WBC 4.0 - 10.5 K/uL 9.0 8.7 8.0 10.5 9.6 15.6 High  8.2  RBC 3.87 - 5.11 MIL/uL 4.30 4.28 4.43 4.55 4.59 4.31 4.25  Hemoglobin 12.0 - 15.0 g/dL 78.2 95.6 21.3 08.6 57.8 13.7 13.5  HCT 36.0 - 46.0 % 41.0 40.3 41.8 42.8 43.5 40.8 40.4  MCV 80.0 - 100.0 fL 95.3 94.2 94.4 94.1 94.8 94.7 95.1  MCH 26.0 - 34.0 pg 31.6 31.1 31.8 31.9 31.6 31.8 31.8  MCHC 30.0 - 36.0 g/dL 46.9 62.9 52.8 41.3 24.4 33.6 33.4  RDW 11.5 - 15.5 % 14.5 14.0 13.9 14.3 14.5 13.7 14.0  Platelets 150 - 400 K/uL 472 High  512 High  470 High  484 High  450 High  458 High  439 High   nRBC 0.0 - 0.2 % 0.0 0.0 0.0 0.0 0.0 0.0 0.0  Neutrophils Relative % % 74 71 69 74 75 82 69  Neutro Abs 1.7 - 7.7 K/uL 6.7 6.3 5.7 7.8 High  7.2 12.7 High  5.7  Lymphocytes Relative % Lymphs Abs 0.7 - 4.0 K/uL 1.3 1.3 1.3 1.6 1.4 1.2 1.4  Monocytes Relative % Monocytes Absolute 0.1 - 1.0 K/uL 0.8 0.9 0.8 1.0 0.8 1.5 High  0.9  Eosinophils Relative % 0 1  Eosinophils Absolute 0.0 - 0.5 K/uL 0.2 0.1 0.1 0.1 0.2 0.1 0.1  Basophils Relative % 0 0 1  Basophils Absolute 0.0 - 0.1 K/uL 0.1 0.1 0.1 0.1 0.0 0.1 0.0  Immature Granulocytes % 0 0 0 1  Abs Immature Granulocytes 0.00 - 0.07 K/uL 0.05 0.06 CM 0.05 CM 0.04 CM 0.04 CM 0.07 CM 0.05 CM  Comment: Performed at [facility], [address] [place], [place] [address]  Resulting Agency [facility]         Specimen Collected:  04/29/22 09:58 Last Resulted: 04/29/22 10:07      Lab Flowsheet      Order Details      View Encounter      Lab and Collection Details      Routing      Result History    View All Conversations on this Encounter      CM=Additional comments      Result Care Coordination   Patient Communication   Add Comments   Add Notifications  Back to Top      Other Results from 04/29/2022   Contains abnormal data Comprehensive metabolic panel Order: [id number] Status: Final result      Visible to patient: Yes (not seen)      Next appt: 06/15/2022 at 10:55 AM in Obstetrics and Gynecology (Allie Bossier, MD)      Dx: Essential thrombocytosis  0 Result Notes            Component Ref Range & Units 1 mo ago (04/29/22) 4 mo ago (01/28/22) 7 mo ago (10/29/21) 10 mo ago (07/25/21) 1 yr ago (04/23/21) 1 yr ago (01/15/21) 1 yr ago (10/14/20)  Sodium 135 - 145 mmol/L 136 132 Low  134 Low  134 Low  135 132 Low  134 Low   Potassium 3.5 - 5.1 mmol/L 4.5 4.6 4.5 4.8 4.5 4.6 4.5  Chloride 98 - 111 mmol/L 103 98 102 100 103 96 Low  99  CO2 22 - 32 mmol/L Glucose, Bld 70 - 99 mg/dL 045 High  409 High  CM 107 High  CM 114 High  CM 123 High  CM 109 High  CM 115 High  CM  Comment: Glucose reference range applies only to samples taken after fasting for at least 8 hours.  BUN 8 - 23 mg/dL 29 High  31 High  26 High  30 High  30 High  27 High  27 High   Creatinine, Ser 0.44 - 1.00 mg/dL 8.11 High  9.14 High  7.82 High  1.12 High  1.23 High  1.10 High  1.15 High   Calcium 8.9 - 10.3 mg/dL 9.4 9.1 9.5 9.3 9.4 9.3 9.2  Total Protein 6.5 - 8.1 g/dL 7.6 7.7 7.6 8.0 7.6 7.8 7.7  Albumin 3.5 - 5.0 g/dL 4.2 4.1 4.3 4.4 4.3 4.6 4.3  AST 15 - 41 U/L ALT 0 - 44 U/L Alkaline Phosphatase 38 - 126 U/L 89 84 86 89 88 82 85  Total Bilirubin 0.3 - 1.2 mg/dL 1.2 1.3 High  1.4 High  1.3 High  0.9 1.4 High  1.4 High   GFR, Estimated >60  mL/min 46 Low  49 Low  CM 43 Low  CM 46 Low  CM 41 Low  CM 47 Low  CM 45 Low  CM  Comment: (NOTE) Calculated using the CKD-EPI Creatinine Equation (2021)  Anion gap 5 - CM 7 CM 9 CM 7 CM 11 CM 8 CM  Comment: Performed at [facility], [address] [place], [place] [address]  Resulting Agency [facility]         Specimen Collected: 04/29/22 09:58 Last Resulted: 04/29/22 10:21

## 2022-06-15 ENCOUNTER — Encounter: Payer: Self-pay | Admitting: Obstetrics & Gynecology

## 2022-06-15 ENCOUNTER — Ambulatory Visit: Payer: Medicare HMO | Admitting: Obstetrics & Gynecology

## 2022-06-15 VITALS — BP 146/57 | HR 75 | Ht 62.0 in | Wt 152.0 lb

## 2022-06-15 DIAGNOSIS — N814 Uterovaginal prolapse, unspecified: Secondary | ICD-10-CM

## 2022-06-15 DIAGNOSIS — N952 Postmenopausal atrophic vaginitis: Secondary | ICD-10-CM

## 2022-06-15 DIAGNOSIS — Z4689 Encounter for fitting and adjustment of other specified devices: Secondary | ICD-10-CM

## 2022-06-15 MED ORDER — NYSTATIN 100000 UNIT/GM EX CREA: 1.0000 | TOPICAL_CREAM | Freq: Two times a day (BID) | CUTANEOUS | 1 refills | Status: DC

## 2022-06-15 NOTE — Progress Notes (Signed)
    GYNECOLOGY PROGRESS NOTE  Subjective:    Patient ID: Gina [name], female    DOB: 01-04-1927, 89 y.o.   MRN: [id number]  HPI  Patient is a 89 y.o. [id number] female who presents for pessary maintenance. She would like to continue to use is. She is requesting a prescription for prn use for topical yeast med as she sometimes gets yeast in her groins.    Objective:   Blood pressure (!) 146/57, pulse 75, height  (1.575 m), weight 152 lb (68.9 kg). Body mass index is 27.8 kg/m.  General appearance: alert Well nourished, well hydrated White female, no apparent distress She is ambulating with a walker and conversing normally, very bright   Abdomen: soft, non-tender; bowel sounds normal; no masses,  no organomegaly Neurologic: Grossly normal  I removed her ring with support pessary and cleaned it.  EG- significant VVA Her prolapse is relatively mild. I placed a spec and noted only VVA, no more excoriations. I replaced the pessary.  Assessment:   1. Vaginal atrophy   2. Uterine prolapse   3. Cystocele with prolapse   4. Pessary maintenance      Plan:   1. Vaginal atrophy - I prescribed an Estring but the cost was prohibitive for her.  2. Cystocele with prolapse - continue pessary use with regular maintainence  3. Pessary maintenance

## 2022-06-22 ENCOUNTER — Ambulatory Visit: Payer: Medicare HMO | Admitting: Dermatology

## 2022-06-24 ENCOUNTER — Ambulatory Visit (INDEPENDENT_AMBULATORY_CARE_PROVIDER_SITE_OTHER): Payer: Medicare HMO | Admitting: Family Medicine

## 2022-06-24 ENCOUNTER — Encounter: Payer: Self-pay | Admitting: Family Medicine

## 2022-06-24 VITALS — BP 120/64 | HR 75 | Temp 98.0°F | Resp 16 | Ht 62.0 in | Wt 153.6 lb

## 2022-06-24 DIAGNOSIS — E782 Mixed hyperlipidemia: Secondary | ICD-10-CM

## 2022-06-24 DIAGNOSIS — G629 Polyneuropathy, unspecified: Secondary | ICD-10-CM | POA: Diagnosis not present

## 2022-06-24 DIAGNOSIS — D473 Essential (hemorrhagic) thrombocythemia: Secondary | ICD-10-CM | POA: Diagnosis not present

## 2022-06-24 DIAGNOSIS — R7303 Prediabetes: Secondary | ICD-10-CM

## 2022-06-24 DIAGNOSIS — I6523 Occlusion and stenosis of bilateral carotid arteries: Secondary | ICD-10-CM

## 2022-06-24 DIAGNOSIS — K219 Gastro-esophageal reflux disease without esophagitis: Secondary | ICD-10-CM | POA: Diagnosis not present

## 2022-06-24 DIAGNOSIS — N1832 Chronic kidney disease, stage 3b: Secondary | ICD-10-CM

## 2022-06-24 DIAGNOSIS — I10 Essential (primary) hypertension: Secondary | ICD-10-CM | POA: Diagnosis not present

## 2022-06-24 DIAGNOSIS — E538 Deficiency of other specified B group vitamins: Secondary | ICD-10-CM

## 2022-06-24 DIAGNOSIS — E890 Postprocedural hypothyroidism: Secondary | ICD-10-CM

## 2022-06-24 DIAGNOSIS — Z5181 Encounter for therapeutic drug level monitoring: Secondary | ICD-10-CM

## 2022-06-24 MED ORDER — AMLODIPINE BESYLATE 5 MG PO TABS
5.0000 mg | ORAL_TABLET | Freq: Every day | ORAL | 1 refills | Status: DC
Start: 2022-06-24 — End: 2022-12-16

## 2022-06-24 NOTE — Progress Notes (Signed)
Name: Gina [name]   MRN: [id number]    DOB: 1926-08-30   Date:06/24/2022       Progress Note  Chief Complaint  Patient presents with   Follow-up   Hypertension   Hyperlipidemia     Subjective:   Gina [name] is a 89 y.o. female, presents to clinic for routine f/up  Gina [name] presents for follow up on chronic medical conditions.  Does reside at [facility], requiring paperwork about today's visit   Hypertension:  Currently managed on norvasc and irbesartan  Pt reports good med compliance and denies any SE.   Blood pressure today is well controlled. BP Readings from Last 3 Encounters:  06/24/22 120/64  06/15/22 (!) 146/57  04/29/22 133/60   Pt denies CP, SOB, exertional sx, LE edema, palpitation, Ha's, visual disturbances, lightheadedness, hypotension, syncope.    B12 deficiency with last ov many acute complaints made about various sx, pt encouraged to come back for separate OV for those complaints, but she has not come back and this is her 6 month f/up Not taking oral B12- she never picked it up or started an OTC suppplement Lab Results  Component Value Date   VITAMINB12 356 12/22/2021   HLD on lipitor 20 -good compliance, no med SE or concerns, due for labs Lab Results  Component Value Date   CHOL 155 06/20/2021   HDL 52 06/20/2021   LDLCALC 77 06/20/2021   TRIG 160 (H) 06/20/2021   CHOLHDL 3.0 06/20/2021     GU sx managed by specialists with vaginal estradiol and pessary      Current Outpatient Medications:    Acetaminophen 500 MG coapsule, Take by mouth every 4 (four) hours as needed for fever., Disp: , Rfl:    amLODipine (NORVASC) 5 MG tablet, Take 1 tablet by mouth once daily, Disp: 90 tablet, Rfl: 0   aspirin EC 81 MG tablet, Take 81 mg by mouth daily. , Disp: , Rfl:    atorvastatin (LIPITOR) 20 MG tablet, TAKE 1 TABLET BY MOUTH AT BEDTIME, Disp: 90 tablet, Rfl: 0   Cholecalciferol (VITAMIN D3) 1000 units CAPS, Take by mouth daily. ,  Disp: , Rfl:    famotidine (PEPCID) 20 MG tablet, Take 1 tablet by mouth twice daily as needed, Disp: 180 tablet, Rfl: 0   hydroxyurea (HYDREA) 500 MG capsule, TAKE 1 CAPSULE BY MOUTH 4 DAYS A WEEK, Disp: 60 capsule, Rfl: 5   irbesartan (AVAPRO) 150 MG tablet, Take 1 tablet (150 mg total) by mouth daily., Disp: 90 tablet, Rfl: 3   ketoconazole (NIZORAL) 2 % shampoo, Apply 1 application. topically 3 (three) times a week. Wash scalp 3 times weekly, let sit 5 minutes and rinse out, Disp: 120 mL, Rfl: 11   levothyroxine (SYNTHROID) 88 MCG tablet, TAKE 1 TABLET BY MOUTH ONCE DAILY BEFORE BREAKFAST, Disp: 90 tablet, Rfl: 2   mupirocin ointment (BACTROBAN) 2 %, Apply to affected area on back with bandage changes until healed., Disp: 22 g, Rfl: 0   nystatin cream (MYCOSTATIN), Apply 1 Application topically 2 (two) times daily., Disp: 30 g, Rfl: 1   nystatin ointment (MYCOSTATIN), Apply 1 application topically 2 (two) times daily., Disp: 30 g, Rfl: 0   Polyethyl Glycol-Propyl Glycol (SYSTANE) 0.4-0.3 % SOLN, Apply to eye., Disp: , Rfl:    Cyanocobalamin (B-12) 500 MCG TABS, Take 500 mcg by mouth daily. (Patient not taking: Reported on 06/24/2022), Disp: 30 tablet, Rfl: 5   estradiol (ESTRING) 7.5 MCG/24HR vaginal ring, Place 1  each vaginally every 3 (three) months. follow package directions (Patient not taking: Reported on 06/24/2022), Disp: 1 each, Rfl: 4   estradiol (ESTRING) 7.5 MCG/24HR vaginal ring, Place 1 each vaginally every 3 (three) months. follow package directions (Patient not taking: Reported on 06/24/2022), Disp: 1 each, Rfl: 4  Patient Active Problem List   Diagnosis Date Noted   Prediabetes 06/20/2021   Paresthesia of upper and lower extremities of both sides 03/20/2020   Requires assistance with activities of daily living (ADL) 03/20/2020   Mixed hyperlipidemia 03/19/2020   Anxiety with depression 03/19/2020   Degenerative disc disease, cervical 05/10/2019   De Quervain's tenosynovitis, left  05/10/2019   Bilateral hand numbness 05/10/2019   DNR (do not resuscitate) 04/22/2018   Venous stasis 04/21/2018   Cystocele with prolapse 04/01/2017   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   Essential thrombocytosis (HCC) 07/23/2015   Mild atherosclerosis of carotid artery 06/25/2015   Dyslipidemia 06/03/2015   Diverticulosis 05/28/2015   GERD without esophagitis 02/06/2015   Neuropathy 01/31/2015   Cervical radiculopathy 01/31/2015   Stage 3b chronic kidney disease (HCC) 10/06/2014   Renal cyst 10/06/2014   Uterine prolapse 10/06/2014   Cervical radiculopathy due to degenerative joint disease of spine 10/06/2014   History of right hip replacement 10/06/2014   Hypothyroidism 10/05/2014   Essential hypertension 09/13/2014    Past Surgical History:  Procedure Laterality Date   ADENOIDECTOMY     APPENDECTOMY     biopsy, right breast      CATARACT EXTRACTION W/PHACO Left 03/25/2015   Procedure: CATARACT EXTRACTION PHACO AND INTRAOCULAR LENS PLACEMENT (IOC);  Surgeon: [name], MD;  Location: [facility] ORS;  Service: Ophthalmology;  Laterality: Left;  [place] 02:09 AP% 27.3 CDE 59.97 fluid pack lot #[id number]   CESAREAN SECTION     COLON SURGERY     JOINT REPLACEMENT Right    total hip   THYROID LOBECTOMY     THYROIDECTOMY     partial   TONSILLECTOMY     TOTAL HIP ARTHROPLASTY     UPPER GI ENDOSCOPY  10/17/2008    Family History  Problem Relation Age of Onset   Stroke Mother    Stroke Father    Dementia Maternal Grandfather     Social History   Tobacco Use   Smoking status: Former   Smokeless tobacco: Never  Building services engineer Use: Never used  Substance Use Topics   Alcohol use: No    Alcohol/week: 0.0 standard drinks of alcohol   Drug use: No     Allergies  Allergen Reactions   Novocain [Procaine] Palpitations    Health Maintenance  Topic Date Due   DTaP/Tdap/Td (1 - Tdap) Never done   COVID-19 Vaccine (4 - 2023-24 season) 07/10/2022 (Originally 05/18/2022)    Medicare Annual Wellness (AWV)  07/25/2022 (Originally 01/05/2018)   Zoster Vaccines- Shingrix (1 of 2) 09/24/2022 (Originally 06/23/1945)   DEXA SCAN  06/24/2023 (Originally 06/22/1993)   INFLUENZA VACCINE  09/24/2022   Pneumonia Vaccine 14+ Years old  Completed   HPV VACCINES  Aged Out   MAMMOGRAM  Discontinued    Chart Review Today: I personally reviewed active problem list, medication list, allergies, family history, social history, health maintenance, notes from last encounter, lab results, imaging with the patient/caregiver today.   Review of Systems  Constitutional: Negative.   HENT: Negative.    Eyes: Negative.   Respiratory: Negative.    Cardiovascular: Negative.   Gastrointestinal: Negative.   Endocrine: Negative.   Genitourinary: Negative.  Musculoskeletal: Negative.   Skin: Negative.   Allergic/Immunologic: Negative.   Neurological: Negative.   Hematological: Negative.   Psychiatric/Behavioral: Negative.    All other systems reviewed and are negative.    Objective:   Vitals:   06/24/22 1344  BP: 120/64  Pulse: 75  Resp: 16  Temp: 98 F (36.7 C)  TempSrc: Oral  SpO2: 92%  Weight: 153 lb 9.6 oz (69.7 kg)  Height: 5\' 2"  (1.575 m)    Body mass index is 28.09 kg/m.  Physical Exam Vitals and nursing note reviewed.  Constitutional:      General: She is not in acute distress.    Appearance: Normal appearance. She is well-developed. She is not ill-appearing, toxic-appearing or diaphoretic.  HENT:     Head: Normocephalic and atraumatic.     Right Ear: External ear normal.     Left Ear: External ear normal.     Nose: Nose normal.     Mouth/Throat:     Pharynx: No oropharyngeal exudate.  Eyes:     General: No scleral icterus.       Right eye: No discharge.        Left eye: No discharge.     Conjunctiva/sclera: Conjunctivae normal.  Neck:     Trachea: No tracheal deviation.  Cardiovascular:     Rate and Rhythm: Normal rate and regular rhythm.      Heart sounds: Normal heart sounds. No murmur heard.    No friction rub. No gallop.  Pulmonary:     Effort: Pulmonary effort is normal. No respiratory distress.     Breath sounds: Normal breath sounds. No stridor. No wheezing or rales.  Chest:     Chest wall: No tenderness.  Abdominal:     General: Bowel sounds are normal. There is no distension.     Palpations: Abdomen is soft.     Tenderness: There is no abdominal tenderness. There is no guarding or rebound.  Musculoskeletal:        General: Normal range of motion.     Cervical back: Normal range of motion and neck supple.  Lymphadenopathy:     Cervical: No cervical adenopathy.  Skin:    General: Skin is warm and dry.     Capillary Refill: Capillary refill takes less than 2 seconds.     Coloration: Skin is not pale.     Findings: No rash.  Neurological:     Mental Status: She is alert. Mental status is at baseline.     Motor: No abnormal muscle tone.     Coordination: Coordination normal.     Gait: Gait abnormal (walks with walker).  Psychiatric:        Mood and Affect: Mood normal.        Behavior: Behavior normal.         Assessment & Plan:   Problem List Items Addressed This Visit       Cardiovascular and Mediastinum   Essential hypertension - Primary (Chronic)    Blood pressure well-controlled and at goal today on amlodipine 5 mg and irbesartan 150 mg BP Readings from Last 3 Encounters:  06/24/22 120/64  06/15/22 (!) 146/57  04/29/22 133/60        Relevant Medications   amLODipine (NORVASC) 5 MG tablet   Other Relevant Orders   COMPLETE METABOLIC PANEL WITH GFR (Completed)   Mild atherosclerosis of carotid artery (Chronic)    Last checked less than 50% on statin      Relevant Medications  amLODipine (NORVASC) 5 MG tablet     Digestive   GERD without esophagitis    No current sx, managed with pepcid bid prn        Endocrine   Hypothyroidism    Pt reports feeling euthyroid On 88 mcg daily Lab  Results  Component Value Date        TSH 2.91 12/22/2021   TSH 2.67 06/20/2021   TSH 1.54 08/12/2020   TSH 2.31 03/20/2020  She typically gets her TSH checked once a year but she would like to have it checked again today, will review results and send in refills (would adjust medications only with significantly elevated TSH)       Relevant Orders   TSH (Completed)     Nervous and Auditory   Neuropathy    No neuropathy complaints today      Relevant Orders   Vitamin B12 (Completed)     Genitourinary   Stage 3b chronic kidney disease (HCC)    Previously patient was consulting with Dr. Thedore Mins at [facility] On irbesartan and Norvasc with blood pressure well-controlled today, will recheck labs and continue monitoring her renal function      Relevant Orders   COMPLETE METABOLIC PANEL WITH GFR (Completed)     Hematopoietic and Hemostatic   Essential thrombocytosis (HCC) (Chronic)    She follows with hematology and is on hydroxyurea, reviewed last labs and office visit        Other   Mixed hyperlipidemia    On atorvastatin 20 mg which she takes daily without any concerning side effects, reports good compliance, due for labs and med refills      Relevant Medications   amLODipine (NORVASC) 5 MG tablet   Other Relevant Orders   COMPLETE METABOLIC PANEL WITH GFR (Completed)   Lipid panel (Completed)   Prediabetes    Not on medications, monitoring A1c 1-2 times a year      Relevant Orders   COMPLETE METABOLIC PANEL WITH GFR (Completed)   Hemoglobin A1c (Completed)   Other Visit Diagnoses     Encounter for medication monitoring       Relevant Orders   COMPLETE METABOLIC PANEL WITH GFR (Completed)   Hemoglobin A1c (Completed)   B12 deficiency       Relevant Orders   Vitamin B12 (Completed)        Return in about 6 months (around 12/25/2022) for Routine follow-up.   [name], PA-C 06/24/22 2:01 PM

## 2022-06-25 LAB — LIPID PANEL
Cholesterol: 157 mg/dL (ref <200)
HDL: 59 mg/dL (ref >=50)
LDL Cholesterol (Calc): 71 mg/dL (calc)
Non-HDL Cholesterol (Calc): 98 mg/dL (calc) (ref <130)
Total CHOL/HDL Ratio: 2.7 (calc) (ref <5.0)
Triglycerides: 200 mg/dL — ABNORMAL HIGH (ref <150)

## 2022-06-25 LAB — HEMOGLOBIN A1C
Hgb A1c MFr Bld: 6.5 % of total Hgb — ABNORMAL HIGH (ref <5.7)
Mean Plasma Glucose: 140 mg/dL
eAG (mmol/L): 7.7 mmol/L

## 2022-06-25 LAB — COMPLETE METABOLIC PANEL WITH GFR
AG Ratio: 1.5 (calc) (ref 1.0–2.5)
ALT: 13 U/L (ref 6–29)
AST: 15 U/L (ref 10–35)
Albumin: 4.4 g/dL (ref 3.6–5.1)
Alkaline phosphatase (APISO): 84 U/L (ref 37–153)
BUN/Creatinine Ratio: 29 (calc) — ABNORMAL HIGH (ref 6–22)
BUN: 34 mg/dL — ABNORMAL HIGH (ref 7–25)
CO2: 24 mmol/L (ref 20–32)
Calcium: 9.8 mg/dL (ref 8.6–10.4)
Chloride: 99 mmol/L (ref 98–110)
Creat: 1.16 mg/dL — ABNORMAL HIGH (ref 0.60–0.95)
Globulin: 2.9 g/dL (calc) (ref 1.9–3.7)
Glucose, Bld: 89 mg/dL (ref 65–99)
Potassium: 5.1 mmol/L (ref 3.5–5.3)
Sodium: 134 mmol/L — ABNORMAL LOW (ref 135–146)
Total Bilirubin: 0.9 mg/dL (ref 0.2–1.2)
Total Protein: 7.3 g/dL (ref 6.1–8.1)
eGFR: 44 mL/min/{1.73_m2} — ABNORMAL LOW (ref >=60)

## 2022-06-25 LAB — VITAMIN B12: Vitamin B-12: 338 pg/mL (ref 200–1100)

## 2022-06-25 LAB — TSH: TSH: 1.84 mIU/L (ref 0.40–4.50)

## 2022-07-07 ENCOUNTER — Encounter: Payer: Self-pay | Admitting: Family Medicine

## 2022-07-07 NOTE — Assessment & Plan Note (Signed)
Pt reports feeling euthyroid On 88 mcg daily Lab Results  Component Value Date        TSH 2.91 12/22/2021   TSH 2.67 06/20/2021   TSH 1.54 08/12/2020   TSH 2.31 03/20/2020   She typically gets her TSH checked once a year but she would like to have it checked again today, will review results and send in refills (would adjust medications only with significantly elevated TSH)

## 2022-07-07 NOTE — Assessment & Plan Note (Signed)
Not on medications, monitoring A1c 1-2 times a year

## 2022-07-07 NOTE — Assessment & Plan Note (Signed)
Blood pressure well-controlled and at goal today on amlodipine 5 mg and irbesartan 150 mg BP Readings from Last 3 Encounters:  06/24/22 120/64  06/15/22 (!) 146/57  04/29/22 133/60

## 2022-07-07 NOTE — Assessment & Plan Note (Signed)
She follows with hematology and is on hydroxyurea, reviewed last labs and office visit

## 2022-07-07 NOTE — Assessment & Plan Note (Signed)
Last checked less than 50% on statin

## 2022-07-07 NOTE — Assessment & Plan Note (Signed)
Previously patient was consulting with Dr. Thedore Mins at [facility] On irbesartan and Norvasc with blood pressure well-controlled today, will recheck labs and continue monitoring her renal function

## 2022-07-07 NOTE — Assessment & Plan Note (Signed)
No neuropathy complaints today

## 2022-07-07 NOTE — Assessment & Plan Note (Signed)
On atorvastatin 20 mg which she takes daily without any concerning side effects, reports good compliance, due for labs and med refills

## 2022-07-07 NOTE — Assessment & Plan Note (Signed)
No current sx, managed with pepcid bid prn

## 2022-07-19 ENCOUNTER — Other Ambulatory Visit: Payer: Self-pay | Admitting: Obstetrics & Gynecology

## 2022-07-22 ENCOUNTER — Ambulatory Visit: Payer: Medicare HMO | Admitting: Dermatology

## 2022-07-22 ENCOUNTER — Encounter: Payer: Self-pay | Admitting: Dermatology

## 2022-07-22 DIAGNOSIS — L578 Other skin changes due to chronic exposure to nonionizing radiation: Secondary | ICD-10-CM | POA: Diagnosis not present

## 2022-07-22 DIAGNOSIS — D2239 Melanocytic nevi of other parts of face: Secondary | ICD-10-CM | POA: Diagnosis not present

## 2022-07-22 DIAGNOSIS — L821 Other seborrheic keratosis: Secondary | ICD-10-CM | POA: Diagnosis not present

## 2022-07-22 DIAGNOSIS — L814 Other melanin hyperpigmentation: Secondary | ICD-10-CM

## 2022-07-22 DIAGNOSIS — X32XXXA Exposure to sunlight, initial encounter: Secondary | ICD-10-CM

## 2022-07-22 DIAGNOSIS — W908XXA Exposure to other nonionizing radiation, initial encounter: Secondary | ICD-10-CM

## 2022-07-22 DIAGNOSIS — L219 Seborrheic dermatitis, unspecified: Secondary | ICD-10-CM

## 2022-07-22 DIAGNOSIS — D225 Melanocytic nevi of trunk: Secondary | ICD-10-CM

## 2022-07-22 DIAGNOSIS — L720 Epidermal cyst: Secondary | ICD-10-CM

## 2022-07-22 DIAGNOSIS — L570 Actinic keratosis: Secondary | ICD-10-CM | POA: Diagnosis not present

## 2022-07-22 DIAGNOSIS — S9031XA Contusion of right foot, initial encounter: Secondary | ICD-10-CM | POA: Diagnosis not present

## 2022-07-22 DIAGNOSIS — L300 Nummular dermatitis: Secondary | ICD-10-CM

## 2022-07-22 DIAGNOSIS — L729 Follicular cyst of the skin and subcutaneous tissue, unspecified: Secondary | ICD-10-CM

## 2022-07-22 DIAGNOSIS — Z1283 Encounter for screening for malignant neoplasm of skin: Secondary | ICD-10-CM | POA: Diagnosis not present

## 2022-07-22 MED ORDER — KETOCONAZOLE 2 % EX SHAM: 1.0000 | MEDICATED_SHAMPOO | CUTANEOUS | 11 refills | Status: DC

## 2022-07-22 NOTE — Patient Instructions (Addendum)
Cryotherapy Aftercare  Wash gently with soap and water everyday.   Apply Vaseline Jelly daily until healed.    Seborrheic dermatitis on face/scalp:  Cont Ketoconazole 2% shampoo ~2-3x/wk, let sit 5 minutes before rinsing out Cont Ketoconazole 2% cr once daily to affected areas on face   Open wounds/Abrasions: Use Mupirocin ointment twice daily as needed until healed.    Dermatitis on body: Use Triamcinolone cream twice daily up to 2 weeks as needed for itchy areas on body.  Avoid applying to face, groin, and axilla. Use as directed. Long-term use can cause thinning of the skin.  Topical steroids (such as triamcinolone, fluocinolone, fluocinonide, mometasone, clobetasol, halobetasol, betamethasone, hydrocortisone) can cause thinning and lightening of the skin if they are used for too long in the same area. Your physician has selected the right strength medicine for your problem and area affected on the body. Please use your medication only as directed by your physician to prevent side effects.    Recommend daily broad spectrum sunscreen SPF 30+ to sun-exposed areas, reapply every 2 hours as needed. Call for new or changing lesions.  Staying in the shade or wearing long sleeves, sun glasses (UVA+UVB protection) and wide brim hats (4-inch brim around the entire circumference of the hat) are also recommended for sun protection.    Melanoma ABCDEs  Melanoma is the most dangerous type of skin cancer, and is the leading cause of death from skin disease.  You are more likely to develop melanoma if you: Have light-colored skin, light-colored eyes, or red or blond hair Spend a lot of time in the sun Tan regularly, either outdoors or in a tanning bed Have had blistering sunburns, especially during childhood Have a close family member who has had a melanoma Have atypical moles or large birthmarks  Early detection of melanoma is key since treatment is typically straightforward and cure rates  are extremely high if we catch it early.   The first sign of melanoma is often a change in a mole or a new dark spot.  The ABCDE system is a way of remembering the signs of melanoma.  A for asymmetry:  The two halves do not match. B for border:  The edges of the growth are irregular. C for color:  A mixture of colors are present instead of an even brown color. D for diameter:  Melanomas are usually (but not always) greater than 6mm - the size of a pencil eraser. E for evolution:  The spot keeps changing in size, shape, and color.  Please check your skin once per month between visits. You can use a small mirror in front and a large mirror behind you to keep an eye on the back side or your body.   If you see any new or changing lesions before your next follow-up, please call to schedule a visit.  Please continue daily skin protection including broad spectrum sunscreen SPF 30+ to sun-exposed areas, reapplying every 2 hours as needed when you're outdoors.   Staying in the shade or wearing long sleeves, sun glasses (UVA+UVB protection) and wide brim hats (4-inch brim around the entire circumference of the hat) are also recommended for sun protection.      Gentle Skin Care Guide  1. Bathe no more than once a day.  2. Avoid bathing in hot water  3. Use a mild soap like Dove, Vanicream, Cetaphil, CeraVe. Can use Lever 2000 or Cetaphil antibacterial soap  4. Use soap only where you need it.  On most days, use it under your arms, between your legs, and on your feet. Let the water rinse other areas unless visibly dirty.  5. When you get out of the bath/shower, use a towel to gently blot your skin dry, don't rub it.  6. While your skin is still a little damp, apply a moisturizing cream such as Vanicream, CeraVe, Cetaphil, Eucerin, Sarna lotion or plain Vaseline Jelly. For hands apply Neutrogena [place] Hand Cream or Excipial Hand Cream.  7. Reapply moisturizer any time you start to itch or  feel dry.  8. Sometimes using free and clear laundry detergents can be helpful. Fabric softener sheets should be avoided. Downy Free & Gentle liquid, or any liquid fabric softener that is free of dyes and perfumes, it acceptable to use  9. If your doctor has given you prescription creams you may apply moisturizers over them       Due to recent changes in healthcare laws, you may see results of your pathology and/or laboratory studies on MyChart before the doctors have had a chance to review them. We understand that in some cases there may be results that are confusing or concerning to you. Please understand that not all results are received at the same time and often the doctors may need to interpret multiple results in order to provide you with the best plan of care or course of treatment. Therefore, we ask that you please give [place] 2 business days to thoroughly review all your results before contacting the office for clarification. Should we see a critical lab result, you will be contacted sooner.   If You Need Anything After Your Visit  If you have any questions or concerns for your doctor, please call our main line at [phone] and press option 4 to reach your doctor's medical assistant. If no one answers, please leave a voicemail as directed and we will return your call as soon as possible. Messages left after 4 pm will be answered the following business day.   You may also send [place] a message via MyChart. We typically respond to MyChart messages within 1-2 business days.  For prescription refills, please ask your pharmacy to contact our office. Our fax number is [phone].  If you have an urgent issue when the clinic is closed that cannot wait until the next business day, you can page your doctor at the number below.    Please note that while we do our best to be available for urgent issues outside of office hours, we are not available 24/7.   If you have an urgent issue and are  unable to reach [place], you may choose to seek medical care at your doctor's office, retail clinic, urgent care center, or emergency room.  If you have a medical emergency, please immediately call 911 or go to the emergency department.  Pager Numbers  - Dr. Gwen Pounds: [phone]  - Dr. Neale Burly: [phone]  - Dr. Roseanne Reno: [phone]  In the event of inclement weather, please call our main line at [phone] for an update on the status of any delays or closures.  Dermatology Medication Tips: Please keep the boxes that topical medications come in in order to help keep track of the instructions about where and how to use these. Pharmacies typically print the medication instructions only on the boxes and not directly on the medication tubes.   If your medication is too expensive, please contact our office at [phone] option 4 or send [place] a message through MyChart.  We are unable to tell what your co-pay for medications will be in advance as this is different depending on your insurance coverage. However, we may be able to find a substitute medication at lower cost or fill out paperwork to get insurance to cover a needed medication.   If a prior authorization is required to get your medication covered by your insurance company, please allow [place] 1-2 business days to complete this process.  Drug prices often vary depending on where the prescription is filled and some pharmacies may offer cheaper prices.  The website www.goodrx.com contains coupons for medications through different pharmacies. The prices here do not account for what the cost may be with help from insurance (it may be cheaper with your insurance), but the website can give you the price if you did not use any insurance.  - You can print the associated coupon and take it with your prescription to the pharmacy.  - You may also stop by our office during regular business hours and pick up a GoodRx coupon card.  - If you need your  prescription sent electronically to a different pharmacy, notify our office through [facility] or by phone at [phone] option 4.     Si Usted Necesita Algo Despus de Su Visita  Tambin puede enviarnos un mensaje a travs de Clinical cytogeneticist. Por lo general respondemos a los mensajes de MyChart en el transcurso de 1 a 2 das hbiles.  Para renovar recetas, por favor pida a su farmacia que se ponga en contacto con nuestra oficina. Annie Sable de fax es [place] [phone].  Si tiene un asunto urgente cuando la clnica est cerrada y que no puede esperar hasta el siguiente da hbil, puede llamar/localizar a su doctor(a) al nmero que aparece a continuacin.   Por favor, tenga en cuenta que aunque hacemos todo lo posible para estar disponibles para asuntos urgentes fuera del horario de [place], no estamos disponibles las 24 horas del da, los 7 [address]  [address] de la [place].   Si tiene un problema urgente y no puede comunicarse con nosotros, puede optar por buscar atencin mdica  en el consultorio de su doctor(a), en una clnica privada, en un centro de atencin urgente o en una sala de emergencias.  Si tiene Engineer, drilling, por favor llame inmediatamente al 911 o vaya a la sala de emergencias.  Nmeros de bper  - Dr. Gwen Pounds: [phone]  - Dra. Moye: [phone]  - Dra. Roseanne Reno: [phone]  En caso de inclemencias del [place], por favor llame a Lacy Duverney principal al [phone] para una actualizacin sobre el [place] de cualquier retraso o cierre.  Consejos para la medicacin en dermatologa: Por favor, guarde las cajas en las que vienen los medicamentos de uso tpico para ayudarle a seguir las instrucciones sobre dnde y cmo usarlos. Las farmacias generalmente imprimen las instrucciones del medicamento slo en las cajas y no directamente en los tubos del [place].   Si su medicamento es muy caro, por favor, pngase en contacto con Rolm Gala llamando al [phone]  y presione la opcin 4 o envenos un mensaje a travs de Clinical cytogeneticist.   No podemos decirle cul ser su copago por los medicamentos por adelantado ya que esto es diferente dependiendo de la cobertura de su seguro. Sin embargo, es posible que podamos encontrar un medicamento sustituto a Audiological scientist un formulario para que el seguro cubra el medicamento que se considera necesario.   Si se requiere una autorizacin previa para que su  compaa de seguros [place] su medicamento, por favor permtanos de 1 a 2 das hbiles para completar [address]  Los precios de los medicamentos varan con frecuencia dependiendo del Environmental consultant de dnde se surte la receta y alguna farmacias pueden ofrecer precios ms baratos.  El sitio web www.goodrx.com tiene cupones para medicamentos de Health and safety inspector. Los precios aqu no tienen en cuenta lo que podra costar con la ayuda del seguro (puede ser ms barato con su seguro), pero el sitio web puede darle el precio si no utiliz Tourist information centre manager.  - Puede imprimir el cupn correspondiente y llevarlo con su receta a la farmacia.  - Tambin puede pasar por nuestra oficina durante el horario de atencin regular y Education officer, museum una tarjeta de cupones de GoodRx.  - Si necesita que su receta se enve electrnicamente a una farmacia diferente, informe a nuestra oficina a travs de MyChart de [place] o por telfono llamando al [phone] y presione la opcin 4.

## 2022-07-22 NOTE — Progress Notes (Signed)
Follow-Up Visit   Subjective  Gina [name] is a 89 y.o. female who presents for the following: Skin Cancer Screening and Full Body Skin Exam  The patient presents for Total-Body Skin Exam (TBSE) for skin cancer screening and mole check. The patient has spots, moles and lesions to be evaluated, some may be new or changing and the patient has concerns that these could be cancer.  She has noticed some pink scaly spots on her forehead.    The following portions of the chart were reviewed this encounter and updated as appropriate: medications, allergies, medical history  Review of Systems:  No other skin or systemic complaints except as noted in HPI or Assessment and Plan.  Objective  Well appearing patient in no apparent distress; mood and affect are within normal limits.  A full examination was performed including scalp, head, eyes, ears, nose, lips, neck, chest, axillae, abdomen, back, buttocks, bilateral upper extremities, bilateral lower extremities, hands, feet, fingers, toes, fingernails, and toenails. All findings within normal limits unless otherwise noted below.   Relevant physical exam findings are noted in the Assessment and Plan.  Right Foot - Anterior Violaceous edematous right foot  forehead x 2 (2) Erythematous thin papules/macules with gritty scale.     Assessment & Plan   LENTIGINES, SEBORRHEIC KERATOSES, HEMANGIOMAS - Benign normal skin lesions - Benign-appearing - Call for any changes  ACTINIC DAMAGE - Chronic condition, secondary to cumulative UV/sun exposure - diffuse scaly erythematous macules with underlying dyspigmentation - Recommend daily broad spectrum sunscreen SPF 30+ to sun-exposed areas, reapply every 2 hours as needed.  - Staying in the shade or wearing long sleeves, sun glasses (UVA+UVB protection) and wide brim hats (4-inch brim around the entire circumference of the hat) are also recommended for sun protection.  - Call for new or changing  lesions.  SKIN CANCER SCREENING PERFORMED TODAY.  EPIDERMAL INCLUSION CYSTS Exam: Subcutaneous nodule at mid back. 6 mm smooth firm white papule at top left ear helix.  Benign-appearing. Exam most consistent with an epidermal inclusion cyst. Discussed that a cyst is a benign growth that can grow over time and sometimes get irritated or inflamed. Recommend observation if it is not bothersome. Discussed option of surgical excision to remove it if it is growing, symptomatic, or other changes noted. Please call for new or changing lesions so they can be evaluated.   SEBORRHEIC DERMATITIS Exam: Mild erythema/scale at scalp, at eyebrows, scalp still itches  Chronic and persistent condition with duration or expected duration over one year. Condition is symptomatic/ bothersome to patient. Not currently at goal.   Seborrheic Dermatitis is a chronic persistent rash characterized by pinkness and scaling most commonly of the mid face but also can occur on the scalp (dandruff), ears; mid chest, mid back and groin.  It tends to be exacerbated by stress and cooler weather.  People who have neurologic disease may experience new onset or exacerbation of existing seborrheic dermatitis.  The condition is not curable but treatable and can be controlled.  Treatment Plan: Cont Ketoconazole 2% shampoo ~2-3x/wk, let sit 5 minutes before rinsing out Cont Ketoconazole 2% cr once daily to affected areas on face  MELANOCYTIC NEVI - Tan-brown and/or pink-flesh-colored symmetric macules and papules - Benign appearing on exam today - Observation - Call clinic for new or changing moles - Recommend daily use of broad spectrum spf 30+ sunscreen to sun-exposed areas.  NEVI Exam:  flesh papule left lateral eyebrow, right medial cheek.  R spinal upper  back 7.67mm speckled brown papule  Treatment Plan: Benign appearing on exam today. Recommend observation. Call clinic for new or changing moles. Recommend daily use of  broad spectrum spf 30+ sunscreen to sun-exposed areas.   Nummular Dermatitis Exam: Mild xerosis today. Otherwise clear   Chronic condition with duration or expected duration over one year. Currently well-controlled.   Treatment Plan: Use Triamcinolone cream twice daily up to 2 weeks as needed for itchy rash on body.  Avoid applying to face, groin, and axilla. Use as directed. Long-term use can cause thinning of the skin.  Topical steroids (such as triamcinolone, fluocinolone, fluocinonide, mometasone, clobetasol, halobetasol, betamethasone, hydrocortisone) can cause thinning and lightening of the skin if they are used for too long in the same area. Your physician has selected the right strength medicine for your problem and area affected on the body. Please use your medication only as directed by your physician to prevent side effects.   Recommend mild soap and moisturizing cream 1-2 times daily.  Contusion of right foot, initial encounter Right Foot - Anterior  Purpura and edema. Secondary to accident with metal chair.   The patient will observe these symptoms, and report promptly any worsening or unexpected persistence.  If well, may return prn.   AK (actinic keratosis) (2) forehead x 2  Actinic keratoses are precancerous spots that appear secondary to cumulative UV radiation exposure/sun exposure over time. They are chronic with expected duration over 1 year. A portion of actinic keratoses will progress to squamous cell carcinoma of the skin. It is not possible to reliably predict which spots will progress to skin cancer and so treatment is recommended to prevent development of skin cancer.  Recommend daily broad spectrum sunscreen SPF 30+ to sun-exposed areas, reapply every 2 hours as needed.  Recommend staying in the shade or wearing long sleeves, sun glasses (UVA+UVB protection) and wide brim hats (4-inch brim around the entire circumference of the hat). Call for new or changing  lesions.  Destruction of lesion - forehead x 2  Destruction method: cryotherapy   Informed consent: discussed and consent obtained   Lesion destroyed using liquid nitrogen: Yes   Region frozen until ice ball extended beyond lesion: Yes   Outcome: patient tolerated procedure well with no complications   Post-procedure details: wound care instructions given   Additional details:  Prior to procedure, discussed risks of blister formation, small wound, skin dyspigmentation, or rare scar following cryotherapy. Recommend Vaseline ointment to treated areas while healing.   Seborrheic dermatitis   Return in about 1 year (around 07/22/2023) for TBSE.  I, Lawson Radar, CMA, am acting as scribe for [name], MD.   Documentation: I have reviewed the above documentation for accuracy and completeness, and I agree with the above.  [name], MD

## 2022-07-27 ENCOUNTER — Ambulatory Visit: Payer: Self-pay | Admitting: *Deleted

## 2022-07-27 DIAGNOSIS — M7989 Other specified soft tissue disorders: Secondary | ICD-10-CM | POA: Diagnosis not present

## 2022-07-27 DIAGNOSIS — M25571 Pain in right ankle and joints of right foot: Secondary | ICD-10-CM | POA: Diagnosis not present

## 2022-07-27 DIAGNOSIS — M25471 Effusion, right ankle: Secondary | ICD-10-CM | POA: Diagnosis not present

## 2022-07-27 DIAGNOSIS — S9031XA Contusion of right foot, initial encounter: Secondary | ICD-10-CM | POA: Diagnosis not present

## 2022-07-27 DIAGNOSIS — M79671 Pain in right foot: Secondary | ICD-10-CM | POA: Diagnosis not present

## 2022-07-27 DIAGNOSIS — Z23 Encounter for immunization: Secondary | ICD-10-CM | POA: Diagnosis not present

## 2022-07-27 NOTE — Telephone Encounter (Signed)
Opened chart to answer question for agent regarding vaccinations.  Especially the Tdap.    Per her chart it does not look like she's ever had a Tdap.   Never done.   I let agent know she was reading it correctly.   [facility] is on line wanting to know about her Tdap vaccination status.

## 2022-07-27 NOTE — Telephone Encounter (Signed)
This encounter was created in error - please disregard.

## 2022-07-28 ENCOUNTER — Other Ambulatory Visit: Payer: Self-pay | Admitting: Internal Medicine

## 2022-07-28 ENCOUNTER — Other Ambulatory Visit: Payer: Self-pay | Admitting: Family Medicine

## 2022-07-28 DIAGNOSIS — N1832 Chronic kidney disease, stage 3b: Secondary | ICD-10-CM

## 2022-07-28 DIAGNOSIS — I10 Essential (primary) hypertension: Secondary | ICD-10-CM

## 2022-07-28 DIAGNOSIS — E782 Mixed hyperlipidemia: Secondary | ICD-10-CM

## 2022-07-28 NOTE — Telephone Encounter (Signed)
Requested Prescriptions  Pending Prescriptions Disp Refills   irbesartan (AVAPRO) 150 MG tablet [Pharmacy Med Name: Irbesartan 150 MG Oral Tablet] 90 tablet 0    Sig: Take 1 tablet by mouth once daily     Cardiovascular:  Angiotensin Receptor Blockers Failed - 07/28/2022  6:43 AM      Failed - Cr in normal range and within 180 days    Creat  Date Value Ref Range Status  06/24/2022 1.16 (H) 0.60 - 0.95 mg/dL Final         Passed - K in normal range and within 180 days    Potassium  Date Value Ref Range Status  06/24/2022 5.1 3.5 - 5.3 mmol/L Final  06/01/2014 4.7 mmol/L Final    Comment:    3.5-5.1 NOTE: New Reference Range  05/01/14          Passed - Patient is not pregnant      Passed - Last BP in normal range    BP Readings from Last 1 Encounters:  06/24/22 120/64         Passed - Valid encounter within last 6 months    Recent Outpatient Visits           1 month ago Essential hypertension   [facility] [name], PA-C   7 months ago Essential hypertension   [facility] [name], PA-C   1 year ago Postoperative hypothyroidism   [facility] Margarita Mail, DO   1 year ago Hypertension, unspecified type   [facility] Margarita Mail, DO   1 year ago Lumbar radiculopathy   [facility] Gabriel Cirri, NP       Future Appointments             In 5 months [name], PA-C [facility], [facility]

## 2022-08-05 DIAGNOSIS — M25571 Pain in right ankle and joints of right foot: Secondary | ICD-10-CM | POA: Diagnosis not present

## 2022-08-05 DIAGNOSIS — S9031XA Contusion of right foot, initial encounter: Secondary | ICD-10-CM | POA: Diagnosis not present

## 2022-08-11 ENCOUNTER — Other Ambulatory Visit: Payer: Self-pay | Admitting: Family Medicine

## 2022-08-11 ENCOUNTER — Other Ambulatory Visit: Payer: Self-pay | Admitting: Obstetrics & Gynecology

## 2022-08-11 DIAGNOSIS — K219 Gastro-esophageal reflux disease without esophagitis: Secondary | ICD-10-CM

## 2022-08-17 ENCOUNTER — Ambulatory Visit: Payer: Self-pay

## 2022-08-17 NOTE — Telephone Encounter (Signed)
  Chief Complaint: nausea Symptoms: nausea, bloating, abd pain, decreased appetite  Frequency: 4 days Pertinent Negatives: Patient denies vomiting Disposition: [] ED /[] Urgent Care (no appt availability in office) / [x] Appointment(In office/virtual)/ []  [place] [facility]/ [] Home Care/ [] Refused Recommended Disposition /[] [place] [facility]/ []  Follow-up with PCP Additional Notes: Misty Stanley, LPN from [facility] calling to schedule pt appt for sx above. Advised appts for today and tomorrow, she transferred me to [place], Journalist, newspaper, scheduled OV tomorrow at 1000 with [place], [place]. Unable to give care advice d/t wasn't transferred back to Eugenio Saenz, LPN.   Reason for Disposition  Unexplained nausea  Answer Assessment - Initial Assessment Questions 1. NAUSEA SEVERITY: "How bad is the nausea?" (e.g., mild, moderate, severe; dehydration, weight loss)   - MILD: loss of appetite without change in eating habits   - MODERATE: decreased oral intake without significant weight loss, dehydration, or malnutrition   - SEVERE: inadequate caloric or fluid intake, significant weight loss, symptoms of dehydration     Moderate  2. ONSET: "When did the nausea begin?"     4 days  3. VOMITING: "Any vomiting?" If Yes, ask: "How many times today?"     no 4. RECURRENT SYMPTOM: "Have you had nausea before?" If Yes, ask: "When was the last time?" "What happened that time?"  Protocols used: Nausea-A-AH

## 2022-08-18 ENCOUNTER — Ambulatory Visit (INDEPENDENT_AMBULATORY_CARE_PROVIDER_SITE_OTHER): Payer: Medicare HMO | Admitting: Physician Assistant

## 2022-08-18 ENCOUNTER — Encounter: Payer: Self-pay | Admitting: Physician Assistant

## 2022-08-18 VITALS — BP 122/62 | HR 96 | Temp 98.4°F | Resp 16 | Ht 62.0 in | Wt 149.7 lb

## 2022-08-18 DIAGNOSIS — R110 Nausea: Secondary | ICD-10-CM | POA: Diagnosis not present

## 2022-08-18 DIAGNOSIS — R8281 Pyuria: Secondary | ICD-10-CM | POA: Diagnosis not present

## 2022-08-18 DIAGNOSIS — K5901 Slow transit constipation: Secondary | ICD-10-CM | POA: Diagnosis not present

## 2022-08-18 LAB — POCT URINALYSIS DIPSTICK
Bilirubin, UA: NEGATIVE
Glucose, UA: NEGATIVE
Ketones, UA: NEGATIVE
Nitrite, UA: NEGATIVE
Protein, UA: NEGATIVE
Spec Grav, UA: 1.01 (ref 1.010–1.025)
Urobilinogen, UA: 0.2 E.U./dL
pH, UA: 5 (ref 5.0–8.0)

## 2022-08-18 MED ORDER — METOCLOPRAMIDE HCL 5 MG PO TABS
5.0000 mg | ORAL_TABLET | Freq: Three times a day (TID) | ORAL | 0 refills | Status: DC | PRN
Start: 2022-08-18 — End: 2022-09-11

## 2022-08-18 NOTE — Patient Instructions (Addendum)
If you feel comfortable attempting an enema, you can try this to provide further constipation relief before trying the Reglan  If this helps with encouraging a more complete bowel movement you can just repeat the enema or see if the constipation resolves with the first one and Miralax  I have sent in a script for a medication called Reglan to help with both the nausea and constipation. Use this medication as needed. It can be taken up to every 8 hours but I think your symptoms will likely improve after 1-2 doses.   This medication can cause dizziness, drowsiness and fatigue so please be mindful of these reactions. Longer durations of treatment (several weeks at higher doses) can cause twitches and other more severe side effects so please only use it as needed.   If you are still having difficulty with having a bowel movement, nausea, and bloating after trying the enema and a few doses of the Reglan, please let us know.

## 2022-08-18 NOTE — Progress Notes (Unsigned)
Acute Office Visit   Patient: Gina [name]   DOB: 12-29-1926   89 y.o. Female  MRN: [id number] Visit Date: 08/18/2022  Today's healthcare provider: Oswaldo Conroy Sohaib Vereen, PA-C  Introduced myself to the patient as a Secondary school teacher and provided education on APPs in clinical practice.    Chief Complaint  Patient presents with   Bloated    W/ no appetite, nausea, belching, constipation, loose stools and gas since Friday   Subjective    HPI HPI     Bloated    Additional comments: W/ no appetite, nausea, belching, constipation, loose stools and gas since Friday      Last edited by Marcos Eke, CMA on 08/18/2022  9:55 AM.       Bloating with nausea  She reports this started Friday with bloating and belching States she feels constantly nauseous but has not vomited She reports some abdominal bloating and tightness  She reports a previous hx of GERD and takes Pepcid but this is not providing relief   She reports she has been having small loose bowel movements with a lot of gas  She states she has been taking Miralax daily to help with constipation - taking full dose  She denies black,tarry stools or blood in her stool    She states she has also been urinating more frequently and is concerned that her urine is so light  She states she brought a sample today    Medications: Outpatient Medications Prior to Visit  Medication Sig   Acetaminophen 500 MG coapsule Take by mouth every 4 (four) hours as needed for fever.   amLODipine (NORVASC) 5 MG tablet Take 1 tablet (5 mg total) by mouth daily.   aspirin EC 81 MG tablet Take 81 mg by mouth daily.    atorvastatin (LIPITOR) 20 MG tablet TAKE 1 TABLET BY MOUTH AT BEDTIME   Cholecalciferol (VITAMIN D3) 1000 units CAPS Take by mouth daily.    famotidine (PEPCID) 20 MG tablet Take 1 tablet by mouth twice daily as needed   hydroxyurea (HYDREA) 500 MG capsule TAKE 1 CAPSULE BY MOUTH 4 DAYS A WEEK   irbesartan (AVAPRO) 150 MG tablet Take 1  tablet by mouth once daily   ketoconazole (NIZORAL) 2 % shampoo Apply 1 Application topically 3 (three) times a week. Wash scalp 3 times weekly, let sit 5 minutes and rinse out   levothyroxine (SYNTHROID) 88 MCG tablet TAKE 1 TABLET BY MOUTH ONCE DAILY BEFORE BREAKFAST   mupirocin ointment (BACTROBAN) 2 % Apply to affected area on back with bandage changes until healed.   nystatin cream (MYCOSTATIN) APPLY 1 APPLICATION TOPICALLY TWICE DAILY   nystatin ointment (MYCOSTATIN) Apply 1 application topically 2 (two) times daily.   Polyethyl Glycol-Propyl Glycol (SYSTANE) 0.4-0.3 % SOLN Apply to eye.   [DISCONTINUED] Cyanocobalamin (B-12) 500 MCG TABS Take 500 mcg by mouth daily. (Patient not taking: Reported on 06/24/2022)   [DISCONTINUED] estradiol (ESTRING) 7.5 MCG/24HR vaginal ring Place 1 each vaginally every 3 (three) months. follow package directions (Patient not taking: Reported on 06/24/2022)   [DISCONTINUED] estradiol (ESTRING) 7.5 MCG/24HR vaginal ring Place 1 each vaginally every 3 (three) months. follow package directions (Patient not taking: Reported on 06/24/2022)   No facility-administered medications prior to visit.    Review of Systems  Constitutional:  Positive for chills. Negative for diaphoresis and fever.  Gastrointestinal:  Positive for abdominal distention, constipation and nausea. Negative for abdominal pain, anal bleeding, blood in  stool and vomiting.  Genitourinary:  Positive for frequency. Negative for difficulty urinating, dysuria and urgency.    {Labs  Heme  Chem  Endocrine  Serology  Results Review (optional):23779}   Objective    BP 122/62   Pulse 96   Temp 98.4 F (36.9 C)   Resp 16   Ht 5\' 2"  (1.575 m)   Wt 149 lb 11.2 oz (67.9 kg)   SpO2 98%   BMI 27.38 kg/m  {Show previous vital signs (optional):23777}  Physical Exam Vitals reviewed.  Constitutional:      General: She is awake. She is not in acute distress.    Appearance: Normal appearance. She is  well-developed and well-groomed. She is not ill-appearing or toxic-appearing.  HENT:     Head: Normocephalic and atraumatic.  Eyes:     Extraocular Movements: Extraocular movements intact.     Conjunctiva/sclera: Conjunctivae normal.  Cardiovascular:     Rate and Rhythm: Normal rate and regular rhythm.     Heart sounds: Normal heart sounds. No murmur heard.    No friction rub. No gallop.  Pulmonary:     Effort: Pulmonary effort is normal.     Breath sounds: Normal breath sounds. No decreased air movement. No decreased breath sounds, wheezing, rhonchi or rales.  Abdominal:     General: Abdomen is flat. Bowel sounds are increased. There is no distension or abdominal bruit.     Palpations: Abdomen is soft. There is no shifting dullness or mass.     Tenderness: There is no abdominal tenderness. There is no guarding or rebound.  Musculoskeletal:     Cervical back: Normal range of motion.  Skin:    General: Skin is warm and dry.  Neurological:     General: No focal deficit present.     Mental Status: She is alert and oriented to person, place, and time. Mental status is at baseline.  Psychiatric:        Mood and Affect: Mood normal.        Behavior: Behavior normal. Behavior is cooperative.        Thought Content: Thought content normal.        Judgment: Judgment normal.       Results for orders placed or performed in visit on 08/18/22  POCT urinalysis dipstick  Result Value Ref Range   Color, UA Yellow    Clarity, UA Cloudy    Glucose, UA Negative Negative   Bilirubin, UA Negative    Ketones, UA Negative    Spec Grav, UA 1.010 1.010 - 1.025   Blood, UA small    pH, UA 5.0 5.0 - 8.0   Protein, UA Negative Negative   Urobilinogen, UA 0.2 0.2 or 1.0 E.U./dL   Nitrite, UA Negative    Leukocytes, UA Large (3+) (A) Negative   Appearance Yellow    Odor Foul     Assessment & Plan      Return for AWV.       Problem List Items Addressed This Visit   None Visit Diagnoses      Nausea    -  Primary   Relevant Medications   metoCLOPramide (REGLAN) 5 MG tablet   Other Relevant Orders   POCT urinalysis dipstick (Completed)   Slow transit constipation       Relevant Medications   metoCLOPramide (REGLAN) 5 MG tablet   Pyuria       Relevant Orders   Urine Culture  Return for AWV.   I, Gurshan Settlemire E Justene Jensen, PA-C, have reviewed all documentation for this visit. The documentation on 08/18/22 for the exam, diagnosis, procedures, and orders are all accurate and complete.   Jacquelin Hawking, MHS, PA-C [facility]

## 2022-08-19 LAB — URINE CULTURE
MICRO NUMBER:: 15126184
SPECIMEN QUALITY:: ADEQUATE

## 2022-08-21 NOTE — Progress Notes (Signed)
Your urine culture did show a small amount of bacteria but not enough to warrant treatment with an antibiotic. If you are continuing to have symptoms, please let us know and we can repeat the testing to make sure nothing is going on.

## 2022-09-08 ENCOUNTER — Encounter: Payer: Self-pay | Admitting: Family Medicine

## 2022-09-08 ENCOUNTER — Ambulatory Visit (INDEPENDENT_AMBULATORY_CARE_PROVIDER_SITE_OTHER): Payer: Medicare HMO | Admitting: Family Medicine

## 2022-09-08 ENCOUNTER — Ambulatory Visit: Payer: Self-pay

## 2022-09-08 VITALS — BP 124/70 | HR 98 | Temp 97.5°F | Resp 16 | Ht 62.0 in | Wt 144.1 lb

## 2022-09-08 DIAGNOSIS — G629 Polyneuropathy, unspecified: Secondary | ICD-10-CM | POA: Diagnosis not present

## 2022-09-08 DIAGNOSIS — E871 Hypo-osmolality and hyponatremia: Secondary | ICD-10-CM

## 2022-09-08 DIAGNOSIS — R110 Nausea: Secondary | ICD-10-CM | POA: Diagnosis not present

## 2022-09-08 DIAGNOSIS — K30 Functional dyspepsia: Secondary | ICD-10-CM | POA: Diagnosis not present

## 2022-09-08 DIAGNOSIS — R0602 Shortness of breath: Secondary | ICD-10-CM

## 2022-09-08 DIAGNOSIS — R0789 Other chest pain: Secondary | ICD-10-CM

## 2022-09-08 DIAGNOSIS — R531 Weakness: Secondary | ICD-10-CM

## 2022-09-08 DIAGNOSIS — R197 Diarrhea, unspecified: Secondary | ICD-10-CM

## 2022-09-08 DIAGNOSIS — H539 Unspecified visual disturbance: Secondary | ICD-10-CM

## 2022-09-08 MED ORDER — PANTOPRAZOLE SODIUM 20 MG PO TBEC: 20.0000 mg | DELAYED_RELEASE_TABLET | Freq: Every day | ORAL | 0 refills | Status: DC

## 2022-09-08 NOTE — Telephone Encounter (Signed)
  Chief Complaint:  Weak, nauseous, sob, numbness in ands and feet, chest heaviness Symptoms: above and anxiety Frequency: 2 weeks progressive Pertinent Negatives: Patient denies  Disposition: [] ED /[] Urgent Care (no appt availability in office) / [x] Appointment(In office/virtual)/ []  [place] [facility]/ [] Home Care/ [] Refused Recommended Disposition /[] [facility]/ []  Follow-up with PCP Additional Notes: Pt has many worrisome s/s and wants to be seen today. Pt is quite anxious. Pt does not want to go to the hospital as she will wait for hours. Appt made for this afternoon.     Reason for Disposition  [1] MODERATE weakness (i.e., interferes with work, school, normal activities) AND [2] cause unknown  (Exceptions: Weakness from acute minor illness or poor fluid intake; weakness is chronic and not worse.)  Answer Assessment - Initial Assessment Questions 1. DESCRIPTION: "Describe how you are feeling."     Weak, nauseous, sob, numbness in ands and feet. 2. SEVERITY: "How bad is it?"  "Can you stand and walk?"   - MILD (0-3): Feels weak or tired, but does not interfere with work, school or normal activities.   - MODERATE (4-7): Able to stand and walk; weakness interferes with work, school, or normal activities.   - SEVERE (8-10): Unable to stand or walk; unable to do usual activities.     Mild moderate 3. ONSET: "When did these symptoms begin?" (e.g., hours, days, weeks, months)     2 weeks 4. CAUSE: "What do you think is causing the weakness or fatigue?" (e.g., not drinking enough fluids, medical problem, trouble sleeping)     Unsure  Protocols used: Weakness (Generalized) and Fatigue-A-AH

## 2022-09-08 NOTE — Progress Notes (Signed)
Patient ID: Gina [name], female    DOB: May 05, 1926, 89 y.o.   MRN: [id number]  PCP: [name], PA-C  Chief Complaint  Patient presents with   Fatigue   Shortness of Breath   Numbness    Bilateral hands and bilateral legs from knee to feet   belching   Eye Problem   Nausea    Pt stated she already had some sx on previous visit and unable to explain which were her new sx and when started    Subjective:   Gina [name] is a 89 y.o. female, presents to clinic with CC of the following:  HPI  Here for weakness, SOB and multiple other sx - nurse line call this afternoon noted "Weak, nauseous, sob, numbness in hands and feet, chest heaviness" she did not want to go to the hospital and wait She is additionally feeling anxious Sx worsening x 2 weeks  Taking colace equate miralax and prune juice stayed constipated x 3 weeks up until a few days ago started having watery diarrhea the last couple days  B 12 deficiency on supplement  Eye trouble - can't see normal, its blurry  Stomach still upset - inddigestion, belching, sometimes she'll hiccup after she burps, decreased appetite   Patient Active Problem List   Diagnosis Date Noted   Prediabetes 06/20/2021   Paresthesia of upper and lower extremities of both sides 03/20/2020   Requires assistance with activities of daily living (ADL) 03/20/2020   Mixed hyperlipidemia 03/19/2020   Anxiety with depression 03/19/2020   Degenerative disc disease, cervical 05/10/2019   De Quervain's tenosynovitis, left 05/10/2019   Bilateral hand numbness 05/10/2019   DNR (do not resuscitate) 04/22/2018   Venous stasis 04/21/2018   Cystocele with prolapse 04/01/2017   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   Essential thrombocytosis (HCC) 07/23/2015   Mild atherosclerosis of carotid artery 06/25/2015   Dyslipidemia 06/03/2015   Diverticulosis 05/28/2015   GERD without esophagitis 02/06/2015   Neuropathy 01/31/2015   Cervical radiculopathy 01/31/2015    Stage 3b chronic kidney disease (HCC) 10/06/2014   Renal cyst 10/06/2014   Uterine prolapse 10/06/2014   Cervical radiculopathy due to degenerative joint disease of spine 10/06/2014   History of right hip replacement 10/06/2014   Hypothyroidism 10/05/2014   Essential hypertension 09/13/2014      Current Outpatient Medications:    Acetaminophen 500 MG coapsule, Take by mouth every 4 (four) hours as needed for fever., Disp: , Rfl:    amLODipine (NORVASC) 5 MG tablet, Take 1 tablet (5 mg total) by mouth daily., Disp: 90 tablet, Rfl: 1   aspirin EC 81 MG tablet, Take 81 mg by mouth daily. , Disp: , Rfl:    atorvastatin (LIPITOR) 20 MG tablet, TAKE 1 TABLET BY MOUTH AT BEDTIME, Disp: 90 tablet, Rfl: 0   Cholecalciferol (VITAMIN D3) 1000 units CAPS, Take by mouth daily. , Disp: , Rfl:    famotidine (PEPCID) 20 MG tablet, Take 1 tablet by mouth twice daily as needed, Disp: 180 tablet, Rfl: 0   hydroxyurea (HYDREA) 500 MG capsule, TAKE 1 CAPSULE BY MOUTH 4 DAYS A WEEK, Disp: 60 capsule, Rfl: 5   irbesartan (AVAPRO) 150 MG tablet, Take 1 tablet by mouth once daily, Disp: 90 tablet, Rfl: 0   ketoconazole (NIZORAL) 2 % shampoo, Apply 1 Application topically 3 (three) times a week. Wash scalp 3 times weekly, let sit 5 minutes and rinse out, Disp: 120 mL, Rfl: 11   levothyroxine (SYNTHROID) 88 MCG tablet, TAKE  1 TABLET BY MOUTH ONCE DAILY BEFORE BREAKFAST, Disp: 90 tablet, Rfl: 2   metoCLOPramide (REGLAN) 5 MG tablet, Take 1 tablet (5 mg total) by mouth every 8 (eight) hours as needed for nausea., Disp: 20 tablet, Rfl: 0   mupirocin ointment (BACTROBAN) 2 %, Apply to affected area on back with bandage changes until healed., Disp: 22 g, Rfl: 0   nystatin cream (MYCOSTATIN), APPLY 1 APPLICATION TOPICALLY TWICE DAILY, Disp: 30 g, Rfl: 0   nystatin ointment (MYCOSTATIN), Apply 1 application topically 2 (two) times daily., Disp: 30 g, Rfl: 0   pantoprazole (PROTONIX) 20 MG tablet, Take 1 tablet (20 mg  total) by mouth at bedtime. Trial for 2 to 4 weeks, Disp: 30 tablet, Rfl: 0   Polyethyl Glycol-Propyl Glycol (SYSTANE) 0.4-0.3 % SOLN, Apply to eye., Disp: , Rfl:    Allergies  Allergen Reactions   Novocain [Procaine] Palpitations     Social History   Tobacco Use   Smoking status: Former   Smokeless tobacco: Never  Vaping Use   Vaping status: Never Used  Substance Use Topics   Alcohol use: No    Alcohol/week: 0.0 standard drinks of alcohol   Drug use: No      Chart Review Today: I personally reviewed active problem list, medication list, allergies, family history, social history, health maintenance, notes from last encounter, lab results, imaging with the patient/caregiver today.   Review of Systems  Constitutional:  Positive for appetite change. Negative for activity change, chills, diaphoresis, fatigue, fever and unexpected weight change.  HENT: Negative.    Eyes: Negative.   Respiratory:  Positive for chest tightness. Negative for cough, shortness of breath and wheezing.   Cardiovascular: Negative.  Negative for chest pain, palpitations and leg swelling.  Gastrointestinal:  Positive for abdominal pain, diarrhea and nausea. Negative for blood in stool.  Endocrine: Negative.   Genitourinary: Negative.   Musculoskeletal: Negative.   Skin: Negative.   Allergic/Immunologic: Negative.   Neurological:  Positive for weakness and numbness. Negative for dizziness, tremors, seizures, syncope, facial asymmetry, speech difficulty, light-headedness and headaches.  Hematological: Negative.   Psychiatric/Behavioral:  Negative for decreased concentration, dysphoric mood and hallucinations. The patient is nervous/anxious.   All other systems reviewed and are negative.      Objective:   Vitals:   09/08/22 1453  BP: 124/70  Pulse: 98  Resp: 16  Temp: (!) 97.5 F (36.4 C)  TempSrc: Oral  SpO2: 93%  Weight: 144 lb 1.6 oz (65.4 kg)  Height: 5\' 2"  (1.575 m)    Body mass index  is 26.36 kg/m.  Physical Exam Vitals and nursing note reviewed.  Constitutional:      General: She is not in acute distress.    Appearance: Normal appearance. She is well-developed. She is not ill-appearing, toxic-appearing or diaphoretic.     Comments: Elderly, well appearing but anxious looking female, NAD  HENT:     Head: Normocephalic and atraumatic.     Right Ear: External ear normal.     Left Ear: External ear normal.     Nose: Nose normal.     Mouth/Throat:     Mouth: Mucous membranes are moist.     Pharynx: Oropharynx is clear. No oropharyngeal exudate or posterior oropharyngeal erythema.  Eyes:     General: No scleral icterus.       Right eye: No discharge.        Left eye: No discharge.     Conjunctiva/sclera: Conjunctivae normal.  Neck:  Trachea: No tracheal deviation.  Cardiovascular:     Rate and Rhythm: Normal rate and regular rhythm. No extrasystoles are present.    Pulses: Normal pulses.          Radial pulses are 2+ on the right side and 2+ on the left side.       Dorsalis pedis pulses are 2+ on the right side and 2+ on the left side.       Posterior tibial pulses are 2+ on the right side and 2+ on the left side.     Heart sounds: Normal heart sounds. No murmur heard.    No friction rub. No gallop.  Pulmonary:     Effort: Pulmonary effort is normal. No tachypnea, accessory muscle usage, respiratory distress or retractions.     Breath sounds: Normal breath sounds. No stridor, decreased air movement or transmitted upper airway sounds. No decreased breath sounds, wheezing, rhonchi or rales.       Comments: Speaking in full and complete sentences Chest:     Chest wall: No tenderness.  Abdominal:     General: Bowel sounds are normal.     Palpations: Abdomen is soft.  Musculoskeletal:     Cervical back: Normal range of motion.     Right lower leg: No edema.     Left lower leg: No edema.  Skin:    General: Skin is warm and dry.     Capillary Refill:  Capillary refill takes less than 2 seconds.     Coloration: Skin is not pale.     Findings: No bruising or rash.  Neurological:     Mental Status: She is alert. Mental status is at baseline.     Motor: No abnormal muscle tone.     Coordination: Coordination normal.     Gait: Gait (same as baseline with rolling walker) normal.  Psychiatric:        Mood and Affect: Mood normal.        Behavior: Behavior normal.      Results for orders placed or performed in visit on 08/18/22  Urine Culture   Specimen: Urine  Result Value Ref Range   MICRO NUMBER: [id number]    SPECIMEN QUALITY: Adequate    Sample Source URINE    STATUS: FINAL    Result:      Less than 10,000 CFU/mL of single Gram positive organism isolated. No further testing will be performed. If clinically indicated, recollection using a method to minimize contamination, with prompt transfer to Urine Culture Transport Tube, is recommended.  POCT urinalysis dipstick  Result Value Ref Range   Color, UA Yellow    Clarity, UA Cloudy    Glucose, UA Negative Negative   Bilirubin, UA Negative    Ketones, UA Negative    Spec Grav, UA 1.010 1.010 - 1.025   Blood, UA small    pH, UA 5.0 5.0 - 8.0   Protein, UA Negative Negative   Urobilinogen, UA 0.2 0.2 or 1.0 E.U./dL   Nitrite, UA Negative    Leukocytes, UA Large (3+) (A) Negative   Appearance Yellow    Odor Foul    ECG interpretation   Date: 09/08/22  Rate: 77  Rhythm: normal sinus rhythm  QRS Axis: normal  Intervals: normal  ST/T Wave abnormalities: normal  Conduction Disutrbances: none  Narrative Interpretation: NRS  Old EKG Reviewed: compared to prior EMS strip, no other ECG - No significant changes noted      Assessment & Plan:  1. Weakness Generalized - likely related to fluid loss from watery stool taking multiple meds for constipation She is well appearing, energetic, VSS, will check labs and r/o UTI - EKG 12-Lead - CBC with Differential/Platelet -  COMPLETE METABOLIC PANEL WITH GFR - Urinalysis, Routine w reflex microscopic - Magnesium - Urine Culture  2. Shortness of breath Episodes of feeling like she can't breath, not exertional, no cough, no pleuritic cp, associated with anxiety Lungs CTA A&P, discussed checking CXR which at this time seems unnecessary - EKG 12-Lead  3. Chest pressure Associated with indigestion, not associated with exertion ECG reassuring - EKG 12-Lead  4. Nausea Indigestion, N, decreased appetite, currently bowels moving, likely upper GI etiology, with do PPI trial and close f/up - EKG 12-Lead  5. Neuropathy Chronic, no focal neurological deficit or weakness Will check labs - CBC with Differential/Platelet - COMPLETE METABOLIC PANEL WITH GFR - Magnesium  6. Indigestion PPI trial  7. Diarrhea, unspecified type Decrease stool softeners to allow for stools to return to soft/formed and avoid watery  - CBC with Differential/Platelet - COMPLETE METABOLIC PANEL WITH GFR - Magnesium  8. Hyponatremia Last labs with mild hyponatremia, pt drinking ample water only - will recheck labs, pt may need to add some electrolytes to PO intake - COMPLETE METABOLIC PANEL WITH GFR - Urinalysis, Routine w reflex microscopic - Magnesium  9. Vision changes Recommend she f/up with optometrist   Pt VS, ambulatory pulse ox, physical exam and ECG were reassuring today Plan as noted above, do suspect some of her sx may be due to anxiety, others likely due to GI etiology, others possibly mild dehydration or electrolyte abnormality If she has any new CP, SOB, confusions, any focal weakness, N/V then pt was instructed to go to ED or call 911.   Return for 2 week recheck on sx.   [name], PA-C 09/08/22 4:29 PM

## 2022-09-08 NOTE — Patient Instructions (Signed)
Follow up with your eye doctor for your vision concerns  Stop the colace and stop one of the other stool softener medicines and then if you still have watery bowel movements then decrease the dose in 1/2  I will call you with the lab results tomorrow and give you more recommendations  For now start the new protonix medication once daily at bedtime and that should slowly improve your nausea, lack of appetite and indigestion over 2-4 weeks.  We want to recheck you in 2 weeks.

## 2022-09-09 ENCOUNTER — Other Ambulatory Visit: Payer: Self-pay

## 2022-09-09 ENCOUNTER — Observation Stay
Admission: EM | Admit: 2022-09-09 | Discharge: 2022-09-11 | Disposition: A | Discharge status: Home / Self Care | Payer: Medicare HMO | Attending: Student | Admitting: Student

## 2022-09-09 ENCOUNTER — Emergency Department: Payer: Medicare HMO

## 2022-09-09 DIAGNOSIS — R531 Weakness: Secondary | ICD-10-CM | POA: Diagnosis not present

## 2022-09-09 DIAGNOSIS — Z7982 Long term (current) use of aspirin: Secondary | ICD-10-CM | POA: Diagnosis not present

## 2022-09-09 DIAGNOSIS — Z87891 Personal history of nicotine dependence: Secondary | ICD-10-CM | POA: Diagnosis not present

## 2022-09-09 DIAGNOSIS — N390 Urinary tract infection, site not specified: Secondary | ICD-10-CM | POA: Insufficient documentation

## 2022-09-09 DIAGNOSIS — I10 Essential (primary) hypertension: Secondary | ICD-10-CM | POA: Diagnosis not present

## 2022-09-09 DIAGNOSIS — E039 Hypothyroidism, unspecified: Secondary | ICD-10-CM | POA: Insufficient documentation

## 2022-09-09 DIAGNOSIS — N1832 Chronic kidney disease, stage 3b: Secondary | ICD-10-CM | POA: Diagnosis not present

## 2022-09-09 DIAGNOSIS — Z79899 Other long term (current) drug therapy: Secondary | ICD-10-CM | POA: Diagnosis not present

## 2022-09-09 DIAGNOSIS — D473 Essential (hemorrhagic) thrombocythemia: Secondary | ICD-10-CM | POA: Diagnosis present

## 2022-09-09 DIAGNOSIS — K5730 Diverticulosis of large intestine without perforation or abscess without bleeding: Secondary | ICD-10-CM | POA: Diagnosis not present

## 2022-09-09 DIAGNOSIS — E871 Hypo-osmolality and hyponatremia: Secondary | ICD-10-CM | POA: Diagnosis not present

## 2022-09-09 DIAGNOSIS — K219 Gastro-esophageal reflux disease without esophagitis: Secondary | ICD-10-CM | POA: Diagnosis not present

## 2022-09-09 DIAGNOSIS — I129 Hypertensive chronic kidney disease with stage 1 through stage 4 chronic kidney disease, or unspecified chronic kidney disease: Secondary | ICD-10-CM | POA: Diagnosis not present

## 2022-09-09 DIAGNOSIS — N3000 Acute cystitis without hematuria: Secondary | ICD-10-CM

## 2022-09-09 DIAGNOSIS — Z96641 Presence of right artificial hip joint: Secondary | ICD-10-CM | POA: Diagnosis not present

## 2022-09-09 DIAGNOSIS — E890 Postprocedural hypothyroidism: Secondary | ICD-10-CM

## 2022-09-09 DIAGNOSIS — N281 Cyst of kidney, acquired: Secondary | ICD-10-CM | POA: Diagnosis not present

## 2022-09-09 DIAGNOSIS — R110 Nausea: Secondary | ICD-10-CM | POA: Diagnosis not present

## 2022-09-09 DIAGNOSIS — R0789 Other chest pain: Secondary | ICD-10-CM | POA: Diagnosis not present

## 2022-09-09 DIAGNOSIS — I700 Atherosclerosis of aorta: Secondary | ICD-10-CM | POA: Diagnosis not present

## 2022-09-09 LAB — CBC WITH DIFFERENTIAL/PLATELET
Abs Immature Granulocytes: 0.05 10*3/uL (ref 0.00–0.07)
Basophils Absolute: 0.1 10*3/uL (ref 0.0–0.1)
Basophils Relative: 1 %
Eosinophils Absolute: 0.1 10*3/uL (ref 0.0–0.5)
Eosinophils Relative: 1 %
HCT: 44.1 % (ref 36.0–46.0)
Hemoglobin: 15.1 g/dL — ABNORMAL HIGH (ref 12.0–15.0)
Immature Granulocytes: 1 %
Lymphocytes Relative: 8 %
Lymphs Abs: 0.9 10*3/uL (ref 0.7–4.0)
MCH: 32.1 pg (ref 26.0–34.0)
MCHC: 34.2 g/dL (ref 30.0–36.0)
MCV: 93.8 fL (ref 80.0–100.0)
Monocytes Absolute: 1 10*3/uL (ref 0.1–1.0)
Monocytes Relative: 10 %
Neutro Abs: 8.1 10*3/uL — ABNORMAL HIGH (ref 1.7–7.7)
Neutrophils Relative %: 79 %
Platelets: 527 10*3/uL — ABNORMAL HIGH (ref 150–400)
RBC: 4.7 MIL/uL (ref 3.87–5.11)
RDW: 13.4 % (ref 11.5–15.5)
WBC: 10.1 10*3/uL (ref 4.0–10.5)
nRBC: 0 % (ref 0.0–0.2)

## 2022-09-09 LAB — COMPREHENSIVE METABOLIC PANEL
ALT: 16 U/L (ref 0–44)
AST: 20 U/L (ref 15–41)
Albumin: 4.4 g/dL (ref 3.5–5.0)
Alkaline Phosphatase: 76 U/L (ref 38–126)
Anion gap: 10 (ref 5–15)
BUN: 25 mg/dL — ABNORMAL HIGH (ref 8–23)
CO2: 22 mmol/L (ref 22–32)
Calcium: 9.4 mg/dL (ref 8.9–10.3)
Chloride: 93 mmol/L — ABNORMAL LOW (ref 98–111)
Creatinine, Ser: 1.1 mg/dL — ABNORMAL HIGH (ref 0.44–1.00)
GFR, Estimated: 47 mL/min — ABNORMAL LOW (ref >60)
Glucose, Bld: 146 mg/dL — ABNORMAL HIGH (ref 70–99)
Potassium: 4 mmol/L (ref 3.5–5.1)
Sodium: 125 mmol/L — ABNORMAL LOW (ref 135–145)
Total Bilirubin: 1.9 mg/dL — ABNORMAL HIGH (ref 0.3–1.2)
Total Protein: 7.8 g/dL (ref 6.5–8.1)

## 2022-09-09 LAB — BASIC METABOLIC PANEL
Anion gap: 11 (ref 5–15)
BUN: 27 mg/dL — ABNORMAL HIGH (ref 8–23)
CO2: 21 mmol/L — ABNORMAL LOW (ref 22–32)
Calcium: 9.4 mg/dL (ref 8.9–10.3)
Chloride: 93 mmol/L — ABNORMAL LOW (ref 98–111)
Creatinine, Ser: 1.07 mg/dL — ABNORMAL HIGH (ref 0.44–1.00)
GFR, Estimated: 48 mL/min — ABNORMAL LOW (ref >60)
Glucose, Bld: 119 mg/dL — ABNORMAL HIGH (ref 70–99)
Potassium: 4.3 mmol/L (ref 3.5–5.1)
Sodium: 125 mmol/L — ABNORMAL LOW (ref 135–145)

## 2022-09-09 LAB — URINALYSIS, ROUTINE W REFLEX MICROSCOPIC
Bilirubin Urine: NEGATIVE
Glucose, UA: NEGATIVE mg/dL
Hgb urine dipstick: NEGATIVE
Ketones, ur: NEGATIVE mg/dL
Nitrite: NEGATIVE
Protein, ur: NEGATIVE mg/dL
Specific Gravity, Urine: 1.011 (ref 1.005–1.030)
pH: 6 (ref 5.0–8.0)

## 2022-09-09 LAB — MRSA NEXT GEN BY PCR, NASAL: MRSA by PCR Next Gen: NOT DETECTED

## 2022-09-09 LAB — OSMOLALITY, URINE: Osmolality, Ur: 386 mOsm/kg (ref 300–900)

## 2022-09-09 LAB — SODIUM, URINE, RANDOM: Sodium, Ur: 52 mmol/L

## 2022-09-09 LAB — BRAIN NATRIURETIC PEPTIDE: B Natriuretic Peptide: 133.1 pg/mL — ABNORMAL HIGH (ref 0.0–100.0)

## 2022-09-09 LAB — OSMOLALITY: Osmolality: 274 mOsm/kg — ABNORMAL LOW (ref 275–295)

## 2022-09-09 LAB — TSH: TSH: 2.478 u[IU]/mL (ref 0.350–4.500)

## 2022-09-09 MED ORDER — SODIUM CHLORIDE 0.9% FLUSH
3.0000 mL | Freq: Two times a day (BID) | INTRAVENOUS | Status: DC
  Administered 2022-09-09 – 2022-09-11 (×4): 3 mL via INTRAVENOUS

## 2022-09-09 MED ORDER — PANTOPRAZOLE SODIUM 40 MG PO TBEC
40.0000 mg | DELAYED_RELEASE_TABLET | Freq: Every day | ORAL | Status: DC
  Administered 2022-09-10 – 2022-09-11 (×2): 40 mg via ORAL
  Filled 2022-09-09 (×2): qty 1

## 2022-09-09 MED ORDER — ENOXAPARIN SODIUM 30 MG/0.3ML IJ SOSY
30.0000 mg | PREFILLED_SYRINGE | INTRAMUSCULAR | Status: DC
  Administered 2022-09-09: 30 mg via SUBCUTANEOUS
  Filled 2022-09-09: qty 0.3

## 2022-09-09 MED ORDER — ACETAMINOPHEN 325 MG PO TABS: 650.0000 mg | ORAL_TABLET | Freq: Four times a day (QID) | ORAL | Status: DC | PRN

## 2022-09-09 MED ORDER — ACETAMINOPHEN 650 MG RE SUPP: 650.0000 mg | Freq: Four times a day (QID) | RECTAL | Status: DC | PRN

## 2022-09-09 MED ORDER — SODIUM CHLORIDE 0.9 % IV SOLN: Freq: Once | INTRAVENOUS | Status: AC

## 2022-09-09 MED ORDER — ENSURE ENLIVE PO LIQD
237.0000 mL | Freq: Two times a day (BID) | ORAL | Status: DC
  Administered 2022-09-10 – 2022-09-11 (×3): 237 mL via ORAL

## 2022-09-09 MED ORDER — ENOXAPARIN SODIUM 40 MG/0.4ML IJ SOSY: 40.0000 mg | PREFILLED_SYRINGE | INTRAMUSCULAR | Status: DC

## 2022-09-09 MED ORDER — SODIUM CHLORIDE 0.9 % IV SOLN: INTRAVENOUS | Status: DC

## 2022-09-09 MED ORDER — IOHEXOL 300 MG/ML  SOLN
80.0000 mL | Freq: Once | INTRAMUSCULAR | Status: AC | PRN
  Administered 2022-09-09: 80 mL via INTRAVENOUS

## 2022-09-09 MED ORDER — SODIUM CHLORIDE 0.9 % IV SOLN
1.0000 g | INTRAVENOUS | Status: DC
  Administered 2022-09-10: 1 g via INTRAVENOUS
  Filled 2022-09-09 (×2): qty 10

## 2022-09-09 MED ORDER — ATORVASTATIN CALCIUM 20 MG PO TABS
20.0000 mg | ORAL_TABLET | Freq: Every day | ORAL | Status: DC
  Administered 2022-09-10: 20 mg via ORAL
  Filled 2022-09-09: qty 1

## 2022-09-09 MED ORDER — HYDROXYUREA 500 MG PO CAPS
500.0000 mg | ORAL_CAPSULE | ORAL | Status: DC
  Administered 2022-09-11: 500 mg via ORAL
  Filled 2022-09-09: qty 1

## 2022-09-09 MED ORDER — SODIUM CHLORIDE 0.9 % IV SOLN
1.0000 g | Freq: Once | INTRAVENOUS | Status: AC
  Administered 2022-09-09: 1 g via INTRAVENOUS
  Filled 2022-09-09: qty 10

## 2022-09-09 MED ORDER — ONDANSETRON HCL 4 MG PO TABS: 4.0000 mg | ORAL_TABLET | Freq: Four times a day (QID) | ORAL | Status: DC | PRN

## 2022-09-09 MED ORDER — SUCRALFATE 1 G PO TABS
1.0000 g | ORAL_TABLET | Freq: Three times a day (TID) | ORAL | Status: DC
  Administered 2022-09-09 – 2022-09-11 (×7): 1 g via ORAL
  Filled 2022-09-09 (×7): qty 1

## 2022-09-09 MED ORDER — ONDANSETRON HCL 4 MG/2ML IJ SOLN: 4.0000 mg | Freq: Four times a day (QID) | INTRAMUSCULAR | Status: DC | PRN

## 2022-09-09 MED ORDER — LEVOTHYROXINE SODIUM 88 MCG PO TABS
88.0000 ug | ORAL_TABLET | Freq: Every day | ORAL | Status: DC
  Administered 2022-09-10 – 2022-09-11 (×2): 88 ug via ORAL
  Filled 2022-09-09 (×2): qty 1

## 2022-09-09 MED ORDER — AMLODIPINE BESYLATE 5 MG PO TABS
5.0000 mg | ORAL_TABLET | Freq: Every day | ORAL | Status: DC
  Administered 2022-09-10 – 2022-09-11 (×2): 5 mg via ORAL
  Filled 2022-09-09 (×2): qty 1

## 2022-09-09 MED ORDER — ASPIRIN 81 MG PO TBEC
81.0000 mg | DELAYED_RELEASE_TABLET | Freq: Every day | ORAL | Status: DC
  Administered 2022-09-10: 81 mg via ORAL
  Filled 2022-09-09: qty 1

## 2022-09-09 NOTE — ED Provider Notes (Signed)
[facility] Provider Note    Event Date/Time   First MD Initiated Contact with Patient 09/09/22 1307     (approximate)   History   Abnormal labs   HPI  Gina [name] is a 89 y.o. female  here with hyponatremia. Pt reports that over the past month, she has had progressively worsening issues with nausea, constipation. She became very concerned about this over the last week and has been taking high doses of OTC laxatives ane enemas. She also does not like the food where sh elives and reports she doesn't each much. She saw her PCP recently and had labs drawn which showed significant hyponatremia. Denies h/o same. She has been otherwise somewhat weak but without complaints. No  tremors, seizures. No confusion. No recent falls. No cough or SOB.       Physical Exam   Triage Vital Signs: ED Triage Vitals [09/09/22 1218]  Encounter Vitals Group     BP 117/63     Systolic BP Percentile      Diastolic BP Percentile      Pulse Rate 95     Resp 18     Temp 98.4 F (36.9 C)     Temp Source Oral     SpO2 96 %     Weight 144 lb (65.3 kg)     Height 5\' 4"  (1.626 m)     Head Circumference      Peak Flow      Pain Score 0     Pain Loc      Pain Education      Exclude from Growth Chart     Most recent vital signs: Vitals:   09/09/22 1639 09/09/22 1642  BP:    Pulse: 74 78  Resp: 12 17  Temp:    SpO2: 99% 100%     General: Awake, no distress.  CV:  Good peripheral perfusion.  Resp:  Normal work of breathing.  Abd:  No distention. No tenderness. Other:  AOx3. No focal neuro deficits.   ED Results / Procedures / Treatments   Labs (all labs ordered are listed, but only abnormal results are displayed) Labs Reviewed  COMPREHENSIVE METABOLIC PANEL - Abnormal; Notable for the following components:      Result Value   Sodium 125 (*)    Chloride 93 (*)    Glucose, Bld 146 (*)    BUN 25 (*)    Creatinine, Ser 1.10 (*)    Total Bilirubin 1.9 (*)     GFR, Estimated 47 (*)    All other components within normal limits  CBC WITH DIFFERENTIAL/PLATELET - Abnormal; Notable for the following components:   Hemoglobin 15.1 (*)    Platelets 527 (*)    Neutro Abs 8.1 (*)    All other components within normal limits  OSMOLALITY - Abnormal; Notable for the following components:   Osmolality 274 (*)    All other components within normal limits  URINALYSIS, ROUTINE W REFLEX MICROSCOPIC - Abnormal; Notable for the following components:   Color, Urine YELLOW (*)    APPearance CLEAR (*)    Leukocytes,Ua LARGE (*)    Bacteria, UA RARE (*)    All other components within normal limits  BRAIN NATRIURETIC PEPTIDE - Abnormal; Notable for the following components:   B Natriuretic Peptide 133.1 (*)    All other components within normal limits  TSH  OSMOLALITY, URINE  SODIUM, URINE, RANDOM  BASIC METABOLIC PANEL  BASIC METABOLIC PANEL  EKG Normal sinus rhythm, VR 70. PR 159, QRS 90< QTc 443. No acute ST elevations or depressions.   RADIOLOGY CT A/P: No acute abnormality CXR: No acute disease   I also independently reviewed and agree with radiologist interpretations.   PROCEDURES:  Critical Care performed: No   MEDICATIONS ORDERED IN ED: Medications  sodium chloride flush (NS) 0.9 % injection 3 mL (has no administration in time range)  acetaminophen (TYLENOL) tablet 650 mg (has no administration in time range)    Or  acetaminophen (TYLENOL) suppository 650 mg (has no administration in time range)  ondansetron (ZOFRAN) tablet 4 mg (has no administration in time range)    Or  ondansetron (ZOFRAN) injection 4 mg (has no administration in time range)  sucralfate (CARAFATE) tablet 1 g (1 g Oral Given 09/09/22 1655)  pantoprazole (PROTONIX) EC tablet 40 mg (has no administration in time range)  enoxaparin (LOVENOX) injection 30 mg (has no administration in time range)  cefTRIAXone (ROCEPHIN) 1 g in sodium chloride 0.9 % 100 mL IVPB (0 g  Intravenous Stopped 09/09/22 1504)  0.9 %  sodium chloride infusion ( Intravenous New Bag/Given 09/09/22 1426)  iohexol (OMNIPAQUE) 300 MG/ML solution 80 mL (80 mLs Intravenous Contrast Given 09/09/22 1450)     IMPRESSION / MDM / ASSESSMENT AND PLAN / ED COURSE  I reviewed the triage vital signs and the nursing notes.                              Differential diagnosis includes, but is not limited to, hyponatremia 2/2 GI losses/laxative use, dehydration, SIADH, medication effect  Patient's presentation is most consistent with acute presentation with potential threat to life or bodily function.  The patient is on the cardiac monitor to evaluate for evidence of arrhythmia and/or significant heart rate changes  89 yo F here with acute hyponatremia, likely 2/2 laxative use and poor PO intake. Na 125, BUN/Cr slightly elevated c/w mild dehydration. Pt appears eu to slightly hypo-volemic. Will start on cautious fluids, admit to medicine. Case discussed with Nephrology Dr. Ronn Melena who recommended NS at 75 cc/hr. Admit to medicine. Neuro is intact.    FINAL CLINICAL IMPRESSION(S) / ED DIAGNOSES   Final diagnoses:  Acute hyponatremia     Rx / DC Orders   ED Discharge Orders     None        Note:  This document was prepared using Dragon voice recognition software and may include unintentional dictation errors.   Shaune Pollack, MD 09/09/22 1726

## 2022-09-09 NOTE — Assessment & Plan Note (Signed)
-   Continue recently started Protonix - Trial of Carafate

## 2022-09-09 NOTE — Assessment & Plan Note (Signed)
TSH is within normal limits.  - Continue home Synthroid

## 2022-09-09 NOTE — Assessment & Plan Note (Signed)
Chronic and unchanged.    - Continue home regimen.

## 2022-09-09 NOTE — Progress Notes (Signed)
PHARMACIST - PHYSICIAN COMMUNICATION  CONCERNING:  Enoxaparin (Lovenox) for DVT Prophylaxis    RECOMMENDATION: Patient was prescribed enoxaprin 40mg  q24 hours for VTE prophylaxis.   Filed Weights   09/09/22 1218  Weight: 65.3 kg (144 lb)    Body mass index is 24.72 kg/m.  Estimated Creatinine Clearance: 27 mL/min (A) (by C-G formula based on SCr of 1.1 mg/dL (H)).    Patient is candidate for enoxaparin 30mg  every 24 hours based on CrCl <110ml/min or Weight <45kg  DESCRIPTION: Pharmacy has adjusted enoxaparin dose per [facility] policy.  Patient is now receiving enoxaparin 30 mg every 24 hours   Elliot Gurney, PharmD, BCPS Clinical Pharmacist  09/09/2022 5:23 PM

## 2022-09-09 NOTE — H&P (Addendum)
History and Physical    Patient: Gina [name] ZOX:[id number] DOB: 20-Jun-1926 DOA: 09/09/2022 DOS: the patient was seen and examined on 09/09/2022 PCP: [name], PA-C  Patient coming from: Home  Chief Complaint:  Chief Complaint  Patient presents with   Abnormal labs   HPI: Gina [name] is a 89 y.o. female with medical history significant of hypertension, hyperlipidemia, hypothyroidism, essential thrombocytosis, who presents to the ED due to abnormal labs.  [name] states that for the last 1 month, she has been experiencing nausea and constipation. Due to this, she has been taking large quantities of laxatives. Then several days ago, she developed water-y diarrhea. After diarrhea started, she became to experience generalized weakness and fatigue.  She also notes quite a bit of anxiety that is surrounding her nausea, but states this is not unusual for her.  In addition, she states that nausea is chronic for many years now.  She denies any dizziness, headache.  She denies any focal weakness at this time.  She states diarrhea has resolved.  ED course: On arrival to the ED, patient normotensive at 117/63 with heart rate of 95.  She was saturating at 100% on room air.  She was afebrile at 98.4. Initial workup demonstrated sodium of 125, potassium 4.0, BUN 22, creatinine 1.1, GFR 47, BNP 133, hemoglobin of 15.1 and platelets of 527.  Serum osmolality 274, with urine sodium of 52 and urine osmolality of 286.  CT of the abdomen was obtained that showed no acute process.  Nephrology was consulted with recommendation to start IV fluids.  TRH contacted for admission.  Review of Systems: As mentioned in the history of present illness. All other systems reviewed and are negative.  Past Medical History:  Diagnosis Date   Breast mass in female    right breast   Cataract    Difficult intubation 03/03/2016   September 06, 2013; left nasal fiberoptic intubation #7 ETT; see letter from Dr. 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 Merlene Pulling   Hemorrhoids 07/29/2016   Hyperlipemia    Hyperlipidemia    history of    Hypothyroidism    Mild atherosclerosis of carotid artery 06/25/2015   Morton's neuroma    MRI contraindicated due to metal implant    Prolapse of uterus    Radiculopathy    Renal cyst    Rosacea    Shingles    Thrombocytosis    Past Surgical History:  Procedure Laterality Date   ADENOIDECTOMY     APPENDECTOMY     biopsy, right breast      CATARACT EXTRACTION W/PHACO Left 03/25/2015   Procedure: CATARACT EXTRACTION PHACO AND INTRAOCULAR LENS PLACEMENT (IOC);  Surgeon: [name], MD;  Location: [facility] ORS;  Service: Ophthalmology;  Laterality: Left;  [place] 02:09 AP% 27.3 CDE 59.97 fluid pack lot #[id number]   CESAREAN SECTION     COLON SURGERY     JOINT REPLACEMENT Right    total hip   THYROID LOBECTOMY     THYROIDECTOMY     partial   TONSILLECTOMY     TOTAL HIP ARTHROPLASTY     UPPER GI ENDOSCOPY  10/17/2008   Social History:  reports that she has quit smoking. She has never used smokeless tobacco. She reports that she does not drink alcohol  and does not use drugs.  Allergies  Allergen Reactions   Novocain [Procaine] Palpitations    Family History  Problem Relation Age of Onset   Stroke Mother    Stroke Father    Dementia Maternal Grandfather     Prior to Admission medications   Medication Sig Start Date End Date Taking? Authorizing Provider  Acetaminophen 500 MG coapsule Take by mouth every 4 (four) hours as needed for fever.    [provider]  amLODipine (NORVASC) 5 MG tablet Take 1 tablet (5 mg total) by mouth daily. 06/24/22   [name], PA-C  aspirin EC 81 MG tablet Take 81 mg by mouth daily.  08/19/15   Kerman Passey, MD  atorvastatin  (LIPITOR) 20 MG tablet TAKE 1 TABLET BY MOUTH AT BEDTIME 07/28/22   [name], PA-C  Cholecalciferol (VITAMIN D3) 1000 units CAPS Take by mouth daily.     [provider]  famotidine (PEPCID) 20 MG tablet Take 1 tablet by mouth twice daily as needed 08/11/22   [name], PA-C  hydroxyurea (HYDREA) 500 MG capsule TAKE 1 CAPSULE BY MOUTH 4 DAYS A WEEK 06/10/22   [name], MD  irbesartan (AVAPRO) 150 MG tablet Take 1 tablet by mouth once daily 07/28/22   Margarita Mail, DO  ketoconazole (NIZORAL) 2 % shampoo Apply 1 Application topically 3 (three) times a week. Wash scalp 3 times weekly, let sit 5 minutes and rinse out 07/22/22   [name], MD  levothyroxine (SYNTHROID) 88 MCG tablet TAKE 1 TABLET BY MOUTH ONCE DAILY BEFORE BREAKFAST 03/09/22   [name], PA-C  metoCLOPramide (REGLAN) 5 MG tablet Take 1 tablet (5 mg total) by mouth every 8 (eight) hours as needed for nausea. 08/18/22   Mecum, Erin E, PA-C  mupirocin ointment (BACTROBAN) 2 % Apply to affected area on back with bandage changes until healed. 02/11/22   [name], MD  nystatin cream (MYCOSTATIN) APPLY 1 APPLICATION TOPICALLY TWICE DAILY 07/27/22   Nicholaus Bloom C, MD  nystatin ointment (MYCOSTATIN) Apply 1 application topically 2 (two) times daily. 08/27/20   Gabriel Cirri, NP  pantoprazole (PROTONIX) 20 MG tablet Take 1 tablet (20 mg total) by mouth at bedtime. Trial for 2 to 4 weeks 09/08/22   [name], PA-C  Polyethyl Glycol-Propyl Glycol (SYSTANE) 0.4-0.3 % SOLN Apply to eye.    [provider]    Physical Exam: Vitals:   09/09/22 1400 09/09/22 1430 09/09/22 1639 09/09/22 1642  BP: 138/74 (!) 152/56    Pulse: 64 76 74 78  Resp: 18 20 12 17   Temp:      TempSrc:      SpO2: 100% 100% 99% 100%  Weight:      Height:       Physical Exam Vitals and nursing note reviewed.  Constitutional:      General: She is not in acute distress.    Appearance: She is normal weight. She is not  toxic-appearing.  HENT:     Mouth/Throat:     Mouth: Mucous membranes are moist.  Eyes:     Conjunctiva/sclera: Conjunctivae normal.     Pupils: Pupils are equal, round, and reactive to light.  Cardiovascular:     Rate and Rhythm: Normal rate and regular rhythm.     Heart sounds: No murmur heard.    No gallop.  Pulmonary:     Effort: Pulmonary effort is normal. No respiratory distress.     Breath sounds: Normal breath sounds. No  wheezing, rhonchi or rales.  Abdominal:     General: Bowel sounds are normal. There is no distension.     Palpations: Abdomen is soft.     Tenderness: There is no abdominal tenderness. There is no guarding.  Musculoskeletal:     Right lower leg: No edema.     Left lower leg: No edema.  Skin:    General: Skin is warm and dry.  Neurological:     General: No focal deficit present.     Mental Status: She is alert and oriented to person, place, and time. Mental status is at baseline.  Psychiatric:        Mood and Affect: Mood normal.        Behavior: Behavior normal.    Data Reviewed: CBC with WBC of 10.1, hemoglobin 15.1, MCV of 93 and platelets of 527 CMP with sodium of 125, potassium 4.0, bicarb 22, glucose 146, BUN 25, creatinine 1.1, anion gap 10, AST 20, ALT 16, total bilirubin 1.9, GFR 47 BNP 133 Serum osmolality 274 Urine osmolality 386 Urine sodium 52 Urinalysis with large leukocytes, rare bacteria and 21-50 WBC/hpf TSH 2.4  EKG personally reviewed.  Sinus rhythm with rate of 70.  No ST or T wave changes concerning for acute ischemia.  CT ABDOMEN PELVIS W CONTRAST  Result Date: 09/09/2022 CLINICAL DATA:  Acute nonlocalized abdominal pain. Low-sodium. Nausea for a month. EXAM: CT ABDOMEN AND PELVIS WITH CONTRAST TECHNIQUE: Multidetector CT imaging of the abdomen and pelvis was performed using the standard protocol following bolus administration of intravenous contrast. RADIATION DOSE REDUCTION: This exam was performed according to the  departmental dose-optimization program which includes automated exposure control, adjustment of the mA and/or kV according to patient size and/or use of iterative reconstruction technique. CONTRAST:  80mL OMNIPAQUE IOHEXOL 300 MG/ML  SOLN COMPARISON:  08/25/2020 FINDINGS: Lower chest: Mild scarring in the lung bases. Hepatobiliary: No focal liver abnormality is seen. No gallstones, gallbladder wall thickening, or biliary dilatation. Pancreas: Unremarkable. No pancreatic ductal dilatation or surrounding inflammatory changes. Spleen: Normal in size without focal abnormality. Adrenals/Urinary Tract: No adrenal gland nodules. Large cysts in the right kidney measuring 8.7 cm diameter. No change since prior study. No imaging follow-up is indicated. No hydronephrosis or hydroureter. Bladder is unremarkable. Stomach/Bowel: Stomach, small bowel, and colon are not abnormally distended. No wall thickening or inflammatory changes. Scattered colonic diverticula without evidence of acute diverticulitis. Appendix is not identified. Vascular/Lymphatic: Aortic atherosclerosis. No enlarged abdominal or pelvic lymph nodes. Reproductive: Uterus and bilateral adnexa are unremarkable. A vaginal pessary is in place. Other: No abdominal wall hernia or abnormality. No abdominopelvic ascites. Musculoskeletal: Degenerative changes in the spine. No destructive bone lesions. Right total hip arthroplasty. IMPRESSION: 1. No acute process demonstrated in the abdomen or pelvis. No evidence of bowel obstruction or inflammation. 2. Aortic atherosclerosis. Electronically Signed   By: Burman Nieves M.D.   On: 09/09/2022 15:35   DG Chest 2 View  Result Date: 09/09/2022 CLINICAL DATA:  Weakness with intermittent chest discomfort. Nausea for a month. EXAM: CHEST - 2 VIEW COMPARISON:  10/14/2018 FINDINGS: Normal heart size and pulmonary vascularity. No focal airspace disease or consolidation in the lungs. No blunting of costophrenic angles. No  pneumothorax. Mediastinal contours appear intact. Calcification of the aorta. Degenerative changes in the spine and shoulders. IMPRESSION: No active cardiopulmonary disease. Electronically Signed   By: Burman Nieves M.D.   On: 09/09/2022 15:30    Results are pending, will review when available.  Assessment and  Plan:  * Hyponatremia Patient presenting with with hyponatremia at 125, previously 123 yesterday, after presenting to her PCPs office with generalized weakness/fatigue.  This is in the setting of aggressive laxative use that subsequently led to diarrhea and hypovolemia.  Serum and urine workup not consistent with SIADH.  - Nephrology consulted in the ED; appreciate their recommendations - Continue IV fluids - BMP in the evening and tomorrow morning  UTI (urinary tract infection) Patient endorsing increased urination with urinalysis concerning for urinary tract infection.  - Continue ceftriaxone for now and transition to oral prior to discharge - Urine culture obtained at PCPs office yesterday pending  Essential thrombocytosis (HCC) Chronic and unchanged.    - Continue home regimen.  GERD without esophagitis - Continue recently started Protonix - Trial of Carafate  Stage 3b chronic kidney disease (HCC) Renal function is at baseline at this time.  - Continue to monitor renal function while admitted  Hypothyroidism TSH is within normal limits.  - Continue home Synthroid  Essential hypertension - Hold home ARB in the setting of hyponatremia; restart prior to discharge - Continue home amlodipine  Advance Care Planning:   Code Status: DNR/DNI per signed gold form on file.   Consults: Nephrology  Family Communication: No family at bedside  Severity of Illness: The appropriate patient status for this patient is OBSERVATION. Observation status is judged to be reasonable and necessary in order to provide the required intensity of service to ensure the patient's safety.  The patient's presenting symptoms, physical exam findings, and initial radiographic and laboratory data in the context of their medical condition is felt to place them at decreased risk for further clinical deterioration. Furthermore, it is anticipated that the patient will be medically stable for discharge from the hospital within 2 midnights of admission.   Author: Verdene Lennert, MD 09/09/2022 5:41 PM  For on call review [email or web address].

## 2022-09-09 NOTE — Assessment & Plan Note (Signed)
Patient endorsing increased urination with urinalysis concerning for urinary tract infection.  - Continue ceftriaxone for now and transition to oral prior to discharge - Urine culture obtained at PCPs office yesterday pending

## 2022-09-09 NOTE — Assessment & Plan Note (Addendum)
Patient presenting with with hyponatremia at 125, previously 123 yesterday, after presenting to her PCPs office with generalized weakness/fatigue.  This is in the setting of aggressive laxative use that subsequently led to diarrhea and hypovolemia.  Serum and urine workup not consistent with SIADH.  - Nephrology consulted in the ED; appreciate their recommendations - Continue IV fluids - BMP in the evening and tomorrow morning

## 2022-09-09 NOTE — Assessment & Plan Note (Signed)
-   Hold home ARB in the setting of hyponatremia; restart prior to discharge - Continue home amlodipine

## 2022-09-09 NOTE — ED Triage Notes (Addendum)
Had appointment with PCP yesterday for routine labs; sent here for low Na. Reports nausea for a month.

## 2022-09-09 NOTE — Assessment & Plan Note (Signed)
Renal function is at baseline at this time.  - Continue to monitor renal function while admitted

## 2022-09-10 DIAGNOSIS — N1832 Chronic kidney disease, stage 3b: Secondary | ICD-10-CM | POA: Diagnosis not present

## 2022-09-10 DIAGNOSIS — E871 Hypo-osmolality and hyponatremia: Secondary | ICD-10-CM

## 2022-09-10 DIAGNOSIS — I129 Hypertensive chronic kidney disease with stage 1 through stage 4 chronic kidney disease, or unspecified chronic kidney disease: Secondary | ICD-10-CM | POA: Diagnosis not present

## 2022-09-10 DIAGNOSIS — N2581 Secondary hyperparathyroidism of renal origin: Secondary | ICD-10-CM | POA: Diagnosis not present

## 2022-09-10 LAB — COMPLETE METABOLIC PANEL WITH GFR
AG Ratio: 1.6 (calc) (ref 1.0–2.5)
ALT: 15 U/L (ref 6–29)
AST: 19 U/L (ref 10–35)
Albumin: 4.7 g/dL (ref 3.6–5.1)
Alkaline phosphatase (APISO): 80 U/L (ref 37–153)
BUN/Creatinine Ratio: 21 (calc) (ref 6–22)
BUN: 21 mg/dL (ref 7–25)
CO2: 24 mmol/L (ref 20–32)
Calcium: 10 mg/dL (ref 8.6–10.4)
Chloride: 89 mmol/L — ABNORMAL LOW (ref 98–110)
Creat: 1.02 mg/dL — ABNORMAL HIGH (ref 0.60–0.95)
Globulin: 2.9 g/dL (calc) (ref 1.9–3.7)
Glucose, Bld: 118 mg/dL — ABNORMAL HIGH (ref 65–99)
Potassium: 4.8 mmol/L (ref 3.5–5.3)
Sodium: 123 mmol/L — ABNORMAL LOW (ref 135–146)
Total Bilirubin: 1.5 mg/dL — ABNORMAL HIGH (ref 0.2–1.2)
Total Protein: 7.6 g/dL (ref 6.1–8.1)
eGFR: 51 mL/min/{1.73_m2} — ABNORMAL LOW (ref >=60)

## 2022-09-10 LAB — URINE CULTURE
MICRO NUMBER:: 15211329
Result:: NO GROWTH
SPECIMEN QUALITY:: ADEQUATE

## 2022-09-10 LAB — BASIC METABOLIC PANEL
Anion gap: 8 (ref 5–15)
Anion gap: 6 (ref 5–15)
BUN: 23 mg/dL (ref 8–23)
BUN: 20 mg/dL (ref 8–23)
CO2: 22 mmol/L (ref 22–32)
CO2: 22 mmol/L (ref 22–32)
Calcium: 8.8 mg/dL — ABNORMAL LOW (ref 8.9–10.3)
Calcium: 8.8 mg/dL — ABNORMAL LOW (ref 8.9–10.3)
Chloride: 101 mmol/L (ref 98–111)
Chloride: 100 mmol/L (ref 98–111)
Creatinine, Ser: 0.89 mg/dL (ref 0.44–1.00)
Creatinine, Ser: 0.98 mg/dL (ref 0.44–1.00)
GFR, Estimated: >60 mL/min (ref >60)
GFR, Estimated: 53 mL/min — ABNORMAL LOW (ref >60)
Glucose, Bld: 100 mg/dL — ABNORMAL HIGH (ref 70–99)
Glucose, Bld: 164 mg/dL — ABNORMAL HIGH (ref 70–99)
Potassium: 3.8 mmol/L (ref 3.5–5.1)
Potassium: 4.3 mmol/L (ref 3.5–5.1)
Sodium: 131 mmol/L — ABNORMAL LOW (ref 135–145)
Sodium: 128 mmol/L — ABNORMAL LOW (ref 135–145)

## 2022-09-10 LAB — URINALYSIS, ROUTINE W REFLEX MICROSCOPIC
Bacteria, UA: NONE SEEN /HPF
Bilirubin Urine: NEGATIVE
Glucose, UA: NEGATIVE
Hgb urine dipstick: NEGATIVE
Hyaline Cast: NONE SEEN /LPF
Ketones, ur: NEGATIVE
Nitrite: NEGATIVE
Protein, ur: NEGATIVE
RBC / HPF: NONE SEEN /HPF (ref 0–2)
Specific Gravity, Urine: 1.014 (ref 1.001–1.035)
Squamous Epithelial / HPF: NONE SEEN /HPF (ref <=5)
pH: 5.5 (ref 5.0–8.0)

## 2022-09-10 LAB — CBC WITH DIFFERENTIAL/PLATELET
Absolute Monocytes: 863 cells/uL (ref 200–950)
Basophils Absolute: 42 cells/uL (ref 0–200)
Basophils Relative: 0.4 %
Eosinophils Absolute: 73 cells/uL (ref 15–500)
Eosinophils Relative: 0.7 %
HCT: 41 % (ref 35.0–45.0)
Hemoglobin: 14.5 g/dL (ref 11.7–15.5)
Lymphs Abs: 863 cells/uL (ref 850–3900)
MCH: 31.9 pg (ref 27.0–33.0)
MCHC: 35.4 g/dL (ref 32.0–36.0)
MCV: 90.3 fL (ref 80.0–100.0)
MPV: 9 fL (ref 7.5–12.5)
Monocytes Relative: 8.3 %
Neutro Abs: 8559 cells/uL — ABNORMAL HIGH (ref 1500–7800)
Neutrophils Relative %: 82.3 %
Platelets: 515 10*3/uL — ABNORMAL HIGH (ref 140–400)
RBC: 4.54 10*6/uL (ref 3.80–5.10)
RDW: 13.5 % (ref 11.0–15.0)
Total Lymphocyte: 8.3 %
WBC: 10.4 10*3/uL (ref 3.8–10.8)

## 2022-09-10 LAB — GASTROINTESTINAL PANEL BY PCR, STOOL (REPLACES STOOL CULTURE)

## 2022-09-10 LAB — C DIFFICILE QUICK SCREEN W PCR REFLEX
C Diff antigen: NEGATIVE
C Diff interpretation: NOT DETECTED
C Diff toxin: NEGATIVE

## 2022-09-10 LAB — MAGNESIUM: Magnesium: 1.9 mg/dL (ref 1.5–2.5)

## 2022-09-10 MED ORDER — SACCHAROMYCES BOULARDII 250 MG PO CAPS
250.0000 mg | ORAL_CAPSULE | Freq: Two times a day (BID) | ORAL | Status: DC
  Administered 2022-09-10 – 2022-09-11 (×2): 250 mg via ORAL
  Filled 2022-09-10 (×2): qty 1

## 2022-09-10 MED ORDER — SIMETHICONE 40 MG/0.6ML PO SUSP
40.0000 mg | Freq: Three times a day (TID) | ORAL | Status: DC
  Administered 2022-09-10 – 2022-09-11 (×3): 40 mg via ORAL
  Filled 2022-09-10 (×2): qty 0.6
  Filled 2022-09-10 (×2): qty 30

## 2022-09-10 MED ORDER — ENOXAPARIN SODIUM 40 MG/0.4ML IJ SOSY
40.0000 mg | PREFILLED_SYRINGE | INTRAMUSCULAR | Status: DC
  Administered 2022-09-10: 40 mg via SUBCUTANEOUS
  Filled 2022-09-10: qty 0.4

## 2022-09-10 MED ORDER — SODIUM CHLORIDE 0.9 % IV SOLN: INTRAVENOUS | Status: AC

## 2022-09-10 NOTE — Progress Notes (Signed)
PHARMACIST - PHYSICIAN COMMUNICATION  CONCERNING:  Enoxaparin (Lovenox) for DVT Prophylaxis    RECOMMENDATION: Patient was prescribed enoxaprin 30mg  q24 hours for VTE prophylaxis.   Filed Weights   09/09/22 1218  Weight: 65.3 kg (144 lb)    Body mass index is 24.72 kg/m.  Estimated Creatinine Clearance: 33.4 mL/min (by C-G formula based on SCr of 0.89 mg/dL).    Patient is candidate for enoxaparin 40mg  every 24 hours based on CrCl >66ml/min   DESCRIPTION: Pharmacy has adjusted enoxaparin dose per [facility] policy.  Patient is now receiving enoxaparin 40 mg every 24 hours   Bari Mantis PharmD Clinical Pharmacist 09/10/2022

## 2022-09-10 NOTE — Plan of Care (Signed)

## 2022-09-10 NOTE — TOC Initial Note (Signed)
Transition of Care [facility]) - Initial/Assessment Note    Patient Details  Name: Gina [name] MRN: [id number] Date of Birth: [birth year]  Transition of Care [facility] CM/SW Contact:    Allena Katz, LCSW Phone Number: 09/10/2022, 1:36 PM  Clinical Narrative: Pt admitted from PCP office for hyponatremia. Pt has a concern for a UTI. UA obtained at PCP office is still pending. Nephrology consulted. TOC following for care plan updates.                     Patient Goals and CMS Choice            Expected Discharge Plan and Services                                              Prior Living Arrangements/Services                       Activities of Daily Living Home Assistive Devices/Equipment: Shower chair without back, Walker (specify type), Grab bars in shower, Hand-held shower hose, Raised toilet seat with rails (rolling walker) ADL Screening (condition at time of admission) Patient's cognitive ability adequate to safely complete daily activities?: Yes Is the patient deaf or have difficulty hearing?: Yes Does the patient have difficulty seeing, even when wearing glasses/contacts?: No Does the patient have difficulty concentrating, remembering, or making decisions?: No Patient able to express need for assistance with ADLs?: Yes Does the patient have difficulty dressing or bathing?: No Independently performs ADLs?: Yes (appropriate for developmental age) Does the patient have difficulty walking or climbing stairs?: Yes Weakness of Legs: None Weakness of Arms/Hands: None  Permission Sought/Granted                  Emotional Assessment              Admission diagnosis:  Acute hyponatremia [E87.1] Hyponatremia [E87.1] Patient Active Problem List   Diagnosis Date Noted   Hyponatremia 09/09/2022   UTI (urinary tract infection) 09/09/2022   Prediabetes 06/20/2021   Paresthesia of upper and lower extremities of both sides 03/20/2020   Requires  assistance with activities of daily living (ADL) 03/20/2020   Mixed hyperlipidemia 03/19/2020   Anxiety with depression 03/19/2020   Degenerative disc disease, cervical 05/10/2019   De Quervain's tenosynovitis, left 05/10/2019   Bilateral hand numbness 05/10/2019   DNR (do not resuscitate) 04/22/2018   Venous stasis 04/21/2018   Cystocele with prolapse 04/01/2017   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   Essential thrombocytosis (HCC) 07/23/2015   Mild atherosclerosis of carotid artery 06/25/2015   Dyslipidemia 06/03/2015   Diverticulosis 05/28/2015   GERD without esophagitis 02/06/2015   Neuropathy 01/31/2015   Cervical radiculopathy 01/31/2015   Stage 3b chronic kidney disease (HCC) 10/06/2014   Renal cyst 10/06/2014   Uterine prolapse 10/06/2014   Cervical radiculopathy due to degenerative joint disease of spine 10/06/2014   History of right hip replacement 10/06/2014   Hypothyroidism 10/05/2014   Essential hypertension 09/13/2014   PCP:  [name], PA-C Pharmacy:   [facility] [address] [place] - [address] [place] [address] Phone: [phone] Fax: [phone]     Social Determinants of Health (SDOH) Social History: SDOH Screenings   Food Insecurity: No Food Insecurity (09/09/2022)  Housing: Low Risk  (09/09/2022)  Transportation Needs: No Transportation Needs (09/09/2022)  Utilities: Not At Risk (09/09/2022)  Alcohol Screen: Low Risk  (08/12/2020)  Depression (PHQ2-9): Low Risk  (09/08/2022)  Tobacco Use: Medium Risk (09/09/2022)   SDOH Interventions:     Readmission Risk Interventions     No data to display

## 2022-09-10 NOTE — Progress Notes (Signed)
[facility]  ROUNDING NOTE   Subjective:   Gina [name] is a 89 year old female with past medical conditions including hyperlipidemia, hypertension, hypothyroidism, and thrombocytosis.  Patient presents to the emergency department with abnormal labs and has been admitted for Acute hyponatremia [E87.1] Hyponatremia [E87.1]  Patient is known to our practice and is seen outpatient by Dr. Thedore Mins.  She was last seen in office on November 10, 2021.  Patient reports constipation at her nursing facility.  She states she has been taking the laxatives which induced diarrhea.  Patient also reported nausea hindering oral intake.  She states she had several loose stools over 2 to 3 days.  Patient is seen sitting up in bed, partially completed breakfast tray at bedside.  Patient states she does not like the meal selection at her nursing facility so therefore does not eat much.  States son usually has food delivered in to her.  Room air with no lower extremity edema.  No complaints of headache or dizziness.  Denies fatigue.  Sodium on ED arrival 125.  Other labs of note include BUN 25, creatinine 1.10 with GFR 47, and hemoglobin 15.1.  UA appears clear with leukocytes and bacteria.  Chest x-ray negative for acute findings.  CT abdomen pelvis negative for obstruction.  Sodium today has corrected to 131.  We have been consulted to help manage hyponatremia.   Objective:  Vital signs in last 24 hours:  Temp:  [97.5 F (36.4 C)-98.8 F (37.1 C)] 97.9 F (36.6 C) (07/18 0822) Pulse Rate:  [71-83] 83 (07/18 0822) Resp:  [12-20] 16 (07/18 0822) BP: (128-156)/(56-72) 156/72 (07/18 0822) SpO2:  [96 %-100 %] 99 % (07/18 0822)  Weight change:  Filed Weights   09/09/22 1218  Weight: 65.3 kg    Intake/Output: I/O last 3 completed shifts: In: 525 [I.V.:525] Out: -    Intake/Output this shift:  Total I/O In: 240 [P.O.:240] Out: -   Physical Exam: General: NAD  Head: Normocephalic,  atraumatic. Moist oral mucosal membranes  Eyes: Anicteric  Lungs:  Clear to auscultation, normal effort  Heart: Regular rate and rhythm  Abdomen:  Soft, nontender,   Extremities:  No peripheral edema.  Neurologic: Alert and oritented, moving all four extremities  Skin: No lesions  Access: None    Basic Metabolic Panel: Recent Labs  Lab 09/08/22 1555 09/09/22 1124 09/09/22 1325 09/10/22 0521  NA 123* 125* 125* 131*  K 4.8 4.0 4.3 3.8  CL 89* 93* 93* 101  CO2 24 22 21* 22  GLUCOSE 118* 146* 119* 100*  BUN 21 25* 27* 23  CREATININE 1.02* 1.10* 1.07* 0.89  CALCIUM 10.0 9.4 9.4 8.8*  MG 1.9  --   --   --     Liver Function Tests: Recent Labs  Lab 09/08/22 1555 09/09/22 1124  AST 19 20  ALT 15 16  ALKPHOS  --  76  BILITOT 1.5* 1.9*  PROT 7.6 7.8  ALBUMIN  --  4.4   No results for input(s): "LIPASE", "AMYLASE" in the last 168 hours. No results for input(s): "AMMONIA" in the last 168 hours.  CBC: Recent Labs  Lab 09/08/22 1555 09/09/22 1325  WBC 10.4 10.1  NEUTROABS 8,559* 8.1*  HGB 14.5 15.1*  HCT 41.0 44.1  MCV 90.3 93.8  PLT 515* 527*    Cardiac Enzymes: No results for input(s): "CKTOTAL", "CKMB", "CKMBINDEX", "TROPONINI" in the last 168 hours.  BNP: Invalid input(s): "POCBNP"  CBG: No results for input(s): "GLUCAP" in the last  168 hours.  Microbiology: Results for orders placed or performed during the hospital encounter of 09/09/22  MRSA Next Gen by PCR, Nasal     Status: None   Collection Time: 09/09/22  8:21 PM   Specimen: Nasal Mucosa; Nasal Swab  Result Value Ref Range Status   MRSA by PCR Next Gen NOT DETECTED NOT DETECTED Final    Comment: (NOTE) The GeneXpert MRSA Assay (FDA approved for NASAL specimens only), is one component of a comprehensive MRSA colonization surveillance program. It is not intended to diagnose MRSA infection nor to guide or monitor treatment for MRSA infections. Test performance is not FDA approved in patients less  than 55 years old. Performed at [facility], [address] [place], [place] [address]   C Difficile Quick Screen w PCR reflex     Status: None   Collection Time: 09/10/22 11:00 AM   Specimen: STOOL  Result Value Ref Range Status   C Diff antigen NEGATIVE NEGATIVE Final   C Diff toxin NEGATIVE NEGATIVE Final   C Diff interpretation No C. difficile detected.  Final    Comment: Performed at [facility], [address] [place], [place] [address]  Gastrointestinal Panel by PCR , Stool     Status: None   Collection Time: 09/10/22 11:00 AM   Specimen: STOOL  Result Value Ref Range Status   Campylobacter species NOT DETECTED NOT DETECTED Final   Plesimonas shigelloides NOT DETECTED NOT DETECTED Final   Salmonella species NOT DETECTED NOT DETECTED Final   Yersinia enterocolitica NOT DETECTED NOT DETECTED Final   Vibrio species NOT DETECTED NOT DETECTED Final   Vibrio cholerae NOT DETECTED NOT DETECTED Final   Enteroaggregative E coli (EAEC) NOT DETECTED NOT DETECTED Final   Enteropathogenic E coli (EPEC) NOT DETECTED NOT DETECTED Final   Enterotoxigenic E coli (ETEC) NOT DETECTED NOT DETECTED Final   Shiga like toxin producing E coli (STEC) NOT DETECTED NOT DETECTED Final   Shigella/Enteroinvasive E coli (EIEC) NOT DETECTED NOT DETECTED Final   Cryptosporidium NOT DETECTED NOT DETECTED Final   Cyclospora cayetanensis NOT DETECTED NOT DETECTED Final   Entamoeba histolytica NOT DETECTED NOT DETECTED Final   Giardia lamblia NOT DETECTED NOT DETECTED Final   Adenovirus F40/41 NOT DETECTED NOT DETECTED Final   Astrovirus NOT DETECTED NOT DETECTED Final   Norovirus GI/GII NOT DETECTED NOT DETECTED Final   Rotavirus A NOT DETECTED NOT DETECTED Final   Sapovirus (I, II, IV, and V) NOT DETECTED NOT DETECTED Final    Comment: Performed at [facility], [address] [place], [place] [address]    Coagulation Studies: No results for input(s): "LABPROT", "INR"  in the last 72 hours.  Urinalysis: Recent Labs    09/08/22 1555 09/09/22 1342  COLORURINE YELLOW YELLOW*  LABSPEC 1.014 1.011  PHURINE 5.5 6.0  GLUCOSEU NEGATIVE NEGATIVE  HGBUR NEGATIVE NEGATIVE  BILIRUBINUR  --  NEGATIVE  KETONESUR NEGATIVE NEGATIVE  PROTEINUR NEGATIVE NEGATIVE  NITRITE NEGATIVE NEGATIVE  LEUKOCYTESUR 3+* LARGE*      Imaging: CT ABDOMEN PELVIS W CONTRAST  Result Date: 09/09/2022 CLINICAL DATA:  Acute nonlocalized abdominal pain. Low-sodium. Nausea for a month. EXAM: CT ABDOMEN AND PELVIS WITH CONTRAST TECHNIQUE: Multidetector CT imaging of the abdomen and pelvis was performed using the standard protocol following bolus administration of intravenous contrast. RADIATION DOSE REDUCTION: This exam was performed according to the departmental dose-optimization program which includes automated exposure control, adjustment of the mA and/or kV according to patient size and/or  use of iterative reconstruction technique. CONTRAST:  80mL OMNIPAQUE IOHEXOL 300 MG/ML  SOLN COMPARISON:  08/25/2020 FINDINGS: Lower chest: Mild scarring in the lung bases. Hepatobiliary: No focal liver abnormality is seen. No gallstones, gallbladder wall thickening, or biliary dilatation. Pancreas: Unremarkable. No pancreatic ductal dilatation or surrounding inflammatory changes. Spleen: Normal in size without focal abnormality. Adrenals/Urinary Tract: No adrenal gland nodules. Large cysts in the right kidney measuring 8.7 cm diameter. No change since prior study. No imaging follow-up is indicated. No hydronephrosis or hydroureter. Bladder is unremarkable. Stomach/Bowel: Stomach, small bowel, and colon are not abnormally distended. No wall thickening or inflammatory changes. Scattered colonic diverticula without evidence of acute diverticulitis. Appendix is not identified. Vascular/Lymphatic: Aortic atherosclerosis. No enlarged abdominal or pelvic lymph nodes. Reproductive: Uterus and bilateral adnexa are  unremarkable. A vaginal pessary is in place. Other: No abdominal wall hernia or abnormality. No abdominopelvic ascites. Musculoskeletal: Degenerative changes in the spine. No destructive bone lesions. Right total hip arthroplasty. IMPRESSION: 1. No acute process demonstrated in the abdomen or pelvis. No evidence of bowel obstruction or inflammation. 2. Aortic atherosclerosis. Electronically Signed   By: Burman Nieves M.D.   On: 09/09/2022 15:35   DG Chest 2 View  Result Date: 09/09/2022 CLINICAL DATA:  Weakness with intermittent chest discomfort. Nausea for a month. EXAM: CHEST - 2 VIEW COMPARISON:  10/14/2018 FINDINGS: Normal heart size and pulmonary vascularity. No focal airspace disease or consolidation in the lungs. No blunting of costophrenic angles. No pneumothorax. Mediastinal contours appear intact. Calcification of the aorta. Degenerative changes in the spine and shoulders. IMPRESSION: No active cardiopulmonary disease. Electronically Signed   By: Burman Nieves M.D.   On: 09/09/2022 15:30     Medications:    sodium chloride 75 mL/hr at 09/10/22 0855   cefTRIAXone (ROCEPHIN)  IV 1 g (09/10/22 1402)    amLODipine  5 mg Oral Daily   aspirin EC  81 mg Oral QHS   atorvastatin  20 mg Oral QHS   enoxaparin (LOVENOX) injection  40 mg Subcutaneous Q24H   feeding supplement  237 mL Oral BID BM   [START ON 09/11/2022] hydroxyurea  500 mg Oral Once per day on Monday Wednesday Friday Saturday   levothyroxine  88 mcg Oral Q0600   pantoprazole  40 mg Oral QAC breakfast   sodium chloride flush  3 mL Intravenous Q12H   sucralfate  1 g Oral TID WC & HS   acetaminophen **OR** acetaminophen, ondansetron **OR** ondansetron (ZOFRAN) IV  Assessment/ Plan:  [name] is a 89 y.o.  female  Gina [name] is a 89 year old female with past medical conditions including hyperlipidemia, hypertension, hypothyroidism, and thrombocytosis.  Patient presents to the emergency department with abnormal  labs and has been admitted for Acute hyponatremia [E87.1] Hyponatremia [E87.1]   Hyponatremia likely secondary to poor oral intake and GI losses. Sodium 123 on admission. Corrected to 131 IV hydration. Will monitor progress  Chronic kidney disease stage IIIb with baseline creatinine 1.11 and GFR of 46 on 04/29/22.  Chronic kidney disease is secondary to Hypertension Renal function remains at baseline  Lab Results  Component Value Date   CREATININE 0.89 09/10/2022   CREATININE 1.07 (H) 09/09/2022   CREATININE 1.10 (H) 09/09/2022    Intake/Output Summary (Last 24 hours) at 09/10/2022 1412 Last data filed at 09/10/2022 0900 Gross per 24 hour  Intake 765 ml  Output --  Net 765 ml   3. Hypertension with chronic kidney disease.  Home regimen includes irbesartan and  amlodipine Currently  receiving amlodipine only  4. Secondary Hyperparathyroidism: with outpatient labs: No recent available Lab Results  Component Value Date   CALCIUM 8.8 (L) 09/10/2022    Patient prescribed cholecalciferol outpatient.   LOS: 0 Gina [name] 7/18/20242:12 PM

## 2022-09-10 NOTE — Plan of Care (Signed)
  Problem: Health Behavior/Discharge Planning: Goal: Ability to manage health-related needs will improve Outcome: Progressing   Problem: Clinical Measurements: Goal: Ability to maintain clinical measurements within normal limits will improve Outcome: Not Progressing Goal: Respiratory complications will improve Outcome: Adequate for Discharge Goal: Cardiovascular complication will be avoided Outcome: Adequate for Discharge   Problem: Activity: Goal: Risk for activity intolerance will decrease Outcome: Adequate for Discharge

## 2022-09-10 NOTE — Progress Notes (Signed)
[facility] Progress Note  Patient: Gina [name]    WUJ:[id number]  DOA: 09/09/2022     Date of Service: the patient was seen and examined on 09/10/2022  Chief Complaint  Patient presents with   Abnormal labs   Brief hospital course: Gina [name] is a 89 y.o. female with medical history significant of hypertension, hyperlipidemia, hypothyroidism, essential thrombocytosis, who presents to the ED due to abnormal labs.  Patient has history of off-and-on diarrhea and constipation, she used excessive laxatives within a week ago and since then she is having loose stools.  He has chronic nausea as well.  Patient was found to have low sodium level and she was sent to the ED.  ED workup: Sodium level was 123, Serum osmolality 274, with urine sodium of 52 and urine osmolality of 286.  CT of the abdomen was obtained that showed no acute process. Nephrology was consulted with recommendation to start IV fluids. [facility] contacted for admission.    Assessment and Plan:  # Hypotonic hyponatremia Serum osmolality 274, patient was drinking excessive water due to diarrhea Na 123--125--131--128 Continue IV fluid for hydration Monitor sodium level daily Nephrology consult appreciated   # Functional diarrhea vs IBS C. difficile and GI pathogen negative Continue to monitor hydration status Continue PPI and Carafate, Started probiotics   # UTI, continue ceftriaxone Positive, follow urine culture  # Hypertension hyperlipidemia Continue monitor pain and Lipitor, aspirin Monitor BP and titrate medications accordingly  Hypothyroid, continue Synthroid   Body mass index is 24.72 kg/m.  Interventions:  Diet: Regular diet DVT Prophylaxis: Subcutaneous Lovenox   Advance goals of care discussion: DNR  Family Communication: family was not present at bedside, at the time of interview.  The pt provided permission to discuss medical plan with the family. Opportunity was given to ask question and  all questions were answered satisfactorily.   Disposition:  Pt is from Home, admitted with diarrhea and hyponatremia, still has diarrhea and low sodium on IV fluid, which precludes a safe discharge. Discharge to home, when clinically stable, may need 1-2 more days to stay. Nephrology following,  Subjective: No significant events overnight, patient had 2 loose stools in the morning time, still feels nauseous and have excessive belching.  Denies any abdominal pain, no nausea vomiting, no chest pain or palpitations, no shortness of breath.  Physical Exam: General: NAD, lying comfortably Appear in no distress, affect appropriate Eyes: PERRLA ENT: Oral Mucosa Clear, moist  Neck: no JVD,  Cardiovascular: S1 and S2 Present, no Murmur,  Respiratory: good respiratory effort, Bilateral Air entry equal and Decreased, no Crackles, no wheezes Abdomen: Bowel Sound present, Soft and no tenderness,  Skin: no rashes Extremities: no Pedal edema, no calf tenderness Neurologic: without any new focal findings Gait not checked due to patient safety concerns  Vitals:   09/09/22 1955 09/09/22 2015 09/10/22 0538 09/10/22 0822  BP: 128/61 139/64 139/65 (!) 156/72  Pulse: 79 78 72 83  Resp: 18   16  Temp: 97.6 F (36.4 C) (!) 97.5 F (36.4 C) 98.6 F (37 C) 97.9 F (36.6 C)  TempSrc:  Oral  Oral  SpO2: 97% 100% 96% 99%  Weight:      Height:        Intake/Output Summary (Last 24 hours) at 09/10/2022 1521 Last data filed at 09/10/2022 1402 Gross per 24 hour  Intake 1025 ml  Output --  Net 1025 ml   Filed Weights   09/09/22 1218  Weight: 65.3 kg  Data Reviewed: I have personally reviewed and interpreted daily labs, tele strips, imagings as discussed above. I reviewed all nursing notes, pharmacy notes, vitals, pertinent old records I have discussed plan of care as described above with RN and patient/family.  CBC: Recent Labs  Lab 09/08/22 1555 09/09/22 1325  WBC 10.4 10.1  NEUTROABS  8,559* 8.1*  HGB 14.5 15.1*  HCT 41.0 44.1  MCV 90.3 93.8  PLT 515* 527*   Basic Metabolic Panel: Recent Labs  Lab 09/08/22 1555 09/09/22 1124 09/09/22 1325 09/10/22 0521 09/10/22 1418  NA 123* 125* 125* 131* 128*  K 4.8 4.0 4.3 3.8 4.3  CL 89* 93* 93* 101 100  CO2 24 22 21* 22 22  GLUCOSE 118* 146* 119* 100* 164*  BUN 21 25* 27* 23 20  CREATININE 1.02* 1.10* 1.07* 0.89 0.98  CALCIUM 10.0 9.4 9.4 8.8* 8.8*  MG 1.9  --   --   --   --     Studies: No results found.  Scheduled Meds:  amLODipine  5 mg Oral Daily   aspirin EC  81 mg Oral QHS   atorvastatin  20 mg Oral QHS   enoxaparin (LOVENOX) injection  40 mg Subcutaneous Q24H   feeding supplement  237 mL Oral BID BM   [START ON 09/11/2022] hydroxyurea  500 mg Oral Once per day on Monday Wednesday Friday Saturday   levothyroxine  88 mcg Oral Q0600   pantoprazole  40 mg Oral QAC breakfast   sodium chloride flush  3 mL Intravenous Q12H   sucralfate  1 g Oral TID WC & HS   Continuous Infusions:  sodium chloride 75 mL/hr at 09/10/22 0855   cefTRIAXone (ROCEPHIN)  IV 1 g (09/10/22 1402)   PRN Meds: acetaminophen **OR** acetaminophen, ondansetron **OR** ondansetron (ZOFRAN) IV  Time spent: 35 minutes  Author: Gillis Santa. MD [facility] 09/10/2022 3:21 PM  To reach On-call, see care teams to locate the attending and reach out to them via [email or web address]. If 7PM-7AM, please contact night-coverage If you still have difficulty reaching the attending provider, please page the [facility] (Director on Call) for [facility] on amion for assistance.

## 2022-09-11 DIAGNOSIS — N1832 Chronic kidney disease, stage 3b: Secondary | ICD-10-CM | POA: Diagnosis not present

## 2022-09-11 DIAGNOSIS — E871 Hypo-osmolality and hyponatremia: Secondary | ICD-10-CM | POA: Diagnosis not present

## 2022-09-11 DIAGNOSIS — N2581 Secondary hyperparathyroidism of renal origin: Secondary | ICD-10-CM | POA: Diagnosis not present

## 2022-09-11 DIAGNOSIS — I129 Hypertensive chronic kidney disease with stage 1 through stage 4 chronic kidney disease, or unspecified chronic kidney disease: Secondary | ICD-10-CM | POA: Diagnosis not present

## 2022-09-11 LAB — URINE CULTURE: Culture: <10000 — AB

## 2022-09-11 LAB — BASIC METABOLIC PANEL
Anion gap: 7 (ref 5–15)
BUN: 21 mg/dL (ref 8–23)
CO2: 23 mmol/L (ref 22–32)
Calcium: 8.9 mg/dL (ref 8.9–10.3)
Chloride: 102 mmol/L (ref 98–111)
Creatinine, Ser: 0.95 mg/dL (ref 0.44–1.00)
GFR, Estimated: 56 mL/min — ABNORMAL LOW (ref >60)
Glucose, Bld: 104 mg/dL — ABNORMAL HIGH (ref 70–99)
Potassium: 4.1 mmol/L (ref 3.5–5.1)
Sodium: 132 mmol/L — ABNORMAL LOW (ref 135–145)

## 2022-09-11 LAB — CBC
HCT: 38 % (ref 36.0–46.0)
Hemoglobin: 13.2 g/dL (ref 12.0–15.0)
MCH: 32.2 pg (ref 26.0–34.0)
MCHC: 34.7 g/dL (ref 30.0–36.0)
MCV: 92.7 fL (ref 80.0–100.0)
Platelets: 461 10*3/uL — ABNORMAL HIGH (ref 150–400)
RBC: 4.1 MIL/uL (ref 3.87–5.11)
RDW: 13.7 % (ref 11.5–15.5)
WBC: 9.1 10*3/uL (ref 4.0–10.5)
nRBC: 0 % (ref 0.0–0.2)

## 2022-09-11 LAB — PHOSPHORUS: Phosphorus: 2.4 mg/dL — ABNORMAL LOW (ref 2.5–4.6)

## 2022-09-11 LAB — MAGNESIUM: Magnesium: 1.9 mg/dL (ref 1.7–2.4)

## 2022-09-11 MED ORDER — AMOXICILLIN-POT CLAVULANATE 875-125 MG PO TABS: 1.0000 | ORAL_TABLET | Freq: Two times a day (BID) | ORAL | 0 refills | Status: AC

## 2022-09-11 MED ORDER — SIMETHICONE 40 MG/0.6ML PO SUSP: 40.0000 mg | Freq: Three times a day (TID) | ORAL | 0 refills | Status: DC

## 2022-09-11 MED ORDER — PANTOPRAZOLE SODIUM 20 MG PO TBEC: DELAYED_RELEASE_TABLET | ORAL | 0 refills | Status: DC

## 2022-09-11 MED ORDER — IRBESARTAN 150 MG PO TABS: 150.0000 mg | ORAL_TABLET | Freq: Every day | ORAL | Status: DC

## 2022-09-11 MED ORDER — POTASSIUM & SODIUM PHOSPHATES 280-160-250 MG PO PACK
2.0000 | PACK | Freq: Three times a day (TID) | ORAL | Status: DC
  Administered 2022-09-11: 2 via ORAL
  Filled 2022-09-11 (×3): qty 2

## 2022-09-11 NOTE — Progress Notes (Signed)
[facility]  ROUNDING NOTE   Subjective:   Gina [name] is a 89 year old female with past medical conditions including hyperlipidemia, hypertension, hypothyroidism, and thrombocytosis.  Patient presents to the emergency department with abnormal labs and has been admitted for Acute hyponatremia [E87.1] Hyponatremia [E87.1]  Patient is known to our practice and is seen outpatient by Dr. Thedore Mins.  She was last seen in office on November 10, 2021.    Patient sitting up in bed Alert and oriented States she feels better  Sodium 132   Objective:  Vital signs in last 24 hours:  Temp:  [97.7 F (36.5 C)-98.7 F (37.1 C)] 97.7 F (36.5 C) (07/19 0820) Pulse Rate:  [67-84] 69 (07/19 0820) Resp:  [16-20] 17 (07/19 0820) BP: (134-155)/(66-74) 147/74 (07/19 0820) SpO2:  [96 %-100 %] 100 % (07/19 0820)  Weight change:  Filed Weights   09/09/22 1218  Weight: 65.3 kg    Intake/Output: I/O last 3 completed shifts: In: 1025 [P.O.:500; I.V.:525] Out: -    Intake/Output this shift:  No intake/output data recorded.  Physical Exam: General: NAD  Head: Normocephalic, atraumatic. Moist oral mucosal membranes  Eyes: Anicteric  Lungs:  Clear to auscultation, normal effort  Heart: Regular rate and rhythm  Abdomen:  Soft, nontender,   Extremities:  No peripheral edema.  Neurologic: Alert and oritented, moving all four extremities  Skin: No lesions  Access: None    Basic Metabolic Panel: Recent Labs  Lab 09/08/22 1555 09/09/22 1124 09/09/22 1325 09/10/22 0521 09/10/22 1418 09/11/22 0457  NA 123* 125* 125* 131* 128* 132*  K 4.8 4.0 4.3 3.8 4.3 4.1  CL 89* 93* 93* 101 100 102  CO2 24 22 21* 22 22 23   GLUCOSE 118* 146* 119* 100* 164* 104*  BUN 21 25* 27* 23 20 21   CREATININE 1.02* 1.10* 1.07* 0.89 0.98 0.95  CALCIUM 10.0 9.4 9.4 8.8* 8.8* 8.9  MG 1.9  --   --   --   --  1.9  PHOS  --   --   --   --   --  2.4*    Liver Function Tests: Recent Labs  Lab  09/08/22 1555 09/09/22 1124  AST 19 20  ALT 15 16  ALKPHOS  --  76  BILITOT 1.5* 1.9*  PROT 7.6 7.8  ALBUMIN  --  4.4   No results for input(s): "LIPASE", "AMYLASE" in the last 168 hours. No results for input(s): "AMMONIA" in the last 168 hours.  CBC: Recent Labs  Lab 09/08/22 1555 09/09/22 1325 09/11/22 0457  WBC 10.4 10.1 9.1  NEUTROABS 8,559* 8.1*  --   HGB 14.5 15.1* 13.2  HCT 41.0 44.1 38.0  MCV 90.3 93.8 92.7  PLT 515* 527* 461*    Cardiac Enzymes: No results for input(s): "CKTOTAL", "CKMB", "CKMBINDEX", "TROPONINI" in the last 168 hours.  BNP: Invalid input(s): "POCBNP"  CBG: No results for input(s): "GLUCAP" in the last 168 hours.  Microbiology: Results for orders placed or performed during the hospital encounter of 09/09/22  MRSA Next Gen by PCR, Nasal     Status: None   Collection Time: 09/09/22  8:21 PM   Specimen: Nasal Mucosa; Nasal Swab  Result Value Ref Range Status   MRSA by PCR Next Gen NOT DETECTED NOT DETECTED Final    Comment: (NOTE) The GeneXpert MRSA Assay (FDA approved for NASAL specimens only), is one component of a comprehensive MRSA colonization surveillance program. It is not intended to diagnose MRSA infection nor  to guide or monitor treatment for MRSA infections. Test performance is not FDA approved in patients less than 2 years old. Performed at [facility], [address] [place], [place] [address]   C Difficile Quick Screen w PCR reflex     Status: None   Collection Time: 09/10/22 11:00 AM   Specimen: STOOL  Result Value Ref Range Status   C Diff antigen NEGATIVE NEGATIVE Final   C Diff toxin NEGATIVE NEGATIVE Final   C Diff interpretation No C. difficile detected.  Final    Comment: Performed at [facility], [address] [place], [place] [address]  Gastrointestinal Panel by PCR , Stool     Status: None   Collection Time: 09/10/22 11:00 AM   Specimen: STOOL  Result Value Ref Range Status    Campylobacter species NOT DETECTED NOT DETECTED Final   Plesimonas shigelloides NOT DETECTED NOT DETECTED Final   Salmonella species NOT DETECTED NOT DETECTED Final   Yersinia enterocolitica NOT DETECTED NOT DETECTED Final   Vibrio species NOT DETECTED NOT DETECTED Final   Vibrio cholerae NOT DETECTED NOT DETECTED Final   Enteroaggregative E coli (EAEC) NOT DETECTED NOT DETECTED Final   Enteropathogenic E coli (EPEC) NOT DETECTED NOT DETECTED Final   Enterotoxigenic E coli (ETEC) NOT DETECTED NOT DETECTED Final   Shiga like toxin producing E coli (STEC) NOT DETECTED NOT DETECTED Final   Shigella/Enteroinvasive E coli (EIEC) NOT DETECTED NOT DETECTED Final   Cryptosporidium NOT DETECTED NOT DETECTED Final   Cyclospora cayetanensis NOT DETECTED NOT DETECTED Final   Entamoeba histolytica NOT DETECTED NOT DETECTED Final   Giardia lamblia NOT DETECTED NOT DETECTED Final   Adenovirus F40/41 NOT DETECTED NOT DETECTED Final   Astrovirus NOT DETECTED NOT DETECTED Final   Norovirus GI/GII NOT DETECTED NOT DETECTED Final   Rotavirus A NOT DETECTED NOT DETECTED Final   Sapovirus (I, II, IV, and V) NOT DETECTED NOT DETECTED Final    Comment: Performed at [facility], [address] [place], [place] [address]    Coagulation Studies: No results for input(s): "LABPROT", "INR" in the last 72 hours.  Urinalysis: Recent Labs    09/08/22 1555 09/09/22 1342  COLORURINE YELLOW YELLOW*  LABSPEC 1.014 1.011  PHURINE 5.5 6.0  GLUCOSEU NEGATIVE NEGATIVE  HGBUR NEGATIVE NEGATIVE  BILIRUBINUR  --  NEGATIVE  KETONESUR NEGATIVE NEGATIVE  PROTEINUR NEGATIVE NEGATIVE  NITRITE NEGATIVE NEGATIVE  LEUKOCYTESUR 3+* LARGE*      Imaging: CT ABDOMEN PELVIS W CONTRAST  Result Date: 09/09/2022 CLINICAL DATA:  Acute nonlocalized abdominal pain. Low-sodium. Nausea for a month. EXAM: CT ABDOMEN AND PELVIS WITH CONTRAST TECHNIQUE: Multidetector CT imaging of the abdomen and pelvis was performed using  the standard protocol following bolus administration of intravenous contrast. RADIATION DOSE REDUCTION: This exam was performed according to the departmental dose-optimization program which includes automated exposure control, adjustment of the mA and/or kV according to patient size and/or use of iterative reconstruction technique. CONTRAST:  80mL OMNIPAQUE IOHEXOL 300 MG/ML  SOLN COMPARISON:  08/25/2020 FINDINGS: Lower chest: Mild scarring in the lung bases. Hepatobiliary: No focal liver abnormality is seen. No gallstones, gallbladder wall thickening, or biliary dilatation. Pancreas: Unremarkable. No pancreatic ductal dilatation or surrounding inflammatory changes. Spleen: Normal in size without focal abnormality. Adrenals/Urinary Tract: No adrenal gland nodules. Large cysts in the right kidney measuring 8.7 cm diameter. No change since prior study. No imaging follow-up is indicated. No hydronephrosis or hydroureter. Bladder is unremarkable. Stomach/Bowel: Stomach, small bowel, and  colon are not abnormally distended. No wall thickening or inflammatory changes. Scattered colonic diverticula without evidence of acute diverticulitis. Appendix is not identified. Vascular/Lymphatic: Aortic atherosclerosis. No enlarged abdominal or pelvic lymph nodes. Reproductive: Uterus and bilateral adnexa are unremarkable. A vaginal pessary is in place. Other: No abdominal wall hernia or abnormality. No abdominopelvic ascites. Musculoskeletal: Degenerative changes in the spine. No destructive bone lesions. Right total hip arthroplasty. IMPRESSION: 1. No acute process demonstrated in the abdomen or pelvis. No evidence of bowel obstruction or inflammation. 2. Aortic atherosclerosis. Electronically Signed   By: Burman Nieves M.D.   On: 09/09/2022 15:35   DG Chest 2 View  Result Date: 09/09/2022 CLINICAL DATA:  Weakness with intermittent chest discomfort. Nausea for a month. EXAM: CHEST - 2 VIEW COMPARISON:  10/14/2018 FINDINGS:  Normal heart size and pulmonary vascularity. No focal airspace disease or consolidation in the lungs. No blunting of costophrenic angles. No pneumothorax. Mediastinal contours appear intact. Calcification of the aorta. Degenerative changes in the spine and shoulders. IMPRESSION: No active cardiopulmonary disease. Electronically Signed   By: Burman Nieves M.D.   On: 09/09/2022 15:30     Medications:    cefTRIAXone (ROCEPHIN)  IV 1 g (09/10/22 1402)    amLODipine  5 mg Oral Daily   aspirin EC  81 mg Oral QHS   atorvastatin  20 mg Oral QHS   enoxaparin (LOVENOX) injection  40 mg Subcutaneous Q24H   feeding supplement  237 mL Oral BID BM   hydroxyurea  500 mg Oral Once per day on Monday Wednesday Friday Saturday   irbesartan  150 mg Oral Daily   levothyroxine  88 mcg Oral Q0600   pantoprazole  40 mg Oral QAC breakfast   potassium & sodium phosphates  2 packet Oral TID WC & HS   saccharomyces boulardii  250 mg Oral BID   simethicone  40 mg Oral TID   sodium chloride flush  3 mL Intravenous Q12H   sucralfate  1 g Oral TID WC & HS   acetaminophen **OR** acetaminophen, ondansetron **OR** ondansetron (ZOFRAN) IV  Assessment/ Plan:  Gina [name] is a 89 y.o.  female  Gina [name] is a 89 year old female with past medical conditions including hyperlipidemia, hypertension, hypothyroidism, and thrombocytosis.  Patient presents to the emergency department with abnormal labs and has been admitted for Acute hyponatremia [E87.1] Hyponatremia [E87.1]   Hyponatremia likely secondary to poor oral intake and GI losses. Sodium 123 on admission. Corrected to 132 today. Patient encouraged to continue oral intake and monitor fluid intake. Will order followup appt at our office with labs in 1-2 weeks.   Chronic kidney disease stage IIIb with baseline creatinine 1.11 and GFR of 46 on 04/29/22.  Chronic kidney disease is secondary to Hypertension Creatinine remains stable  Lab Results  Component  Value Date   CREATININE 0.95 09/11/2022   CREATININE 0.98 09/10/2022   CREATININE 0.89 09/10/2022    Intake/Output Summary (Last 24 hours) at 09/11/2022 1158 Last data filed at 09/10/2022 1402 Gross per 24 hour  Intake 260 ml  Output --  Net 260 ml   3. Hypertension with chronic kidney disease.  Home regimen includes irbesartan and amlodipine Currently  receiving amlodipine only. Restart irbesartan at discharge.  4. Secondary Hyperparathyroidism: with outpatient labs: No recent available Lab Results  Component Value Date   CALCIUM 8.9 09/11/2022   PHOS 2.4 (L) 09/11/2022    Patient prescribed cholecalciferol outpatient.   LOS: 0 Gina [name] 7/19/202411:58 AM

## 2022-09-11 NOTE — Discharge Summary (Signed)
[facility] Discharge Summary   Patient: Gina [name] ZOX:[id number]  PCP: [name], PA-C  Date of admission: 09/09/2022   Date of discharge: 09/11/2022     Discharge Diagnoses:  Principal Problem:   Hyponatremia Active Problems:   UTI (urinary tract infection)   Essential hypertension   Hypothyroidism   Stage 3b chronic kidney disease (HCC)   GERD without esophagitis   Essential thrombocytosis (HCC)   Admitted From: Home Disposition:  Home  Recommendations for Outpatient Follow-up:  PCP: in 1 wk Nephrologist in 1 week Follow up LABS/TEST: BMP in 1 week   Diet recommendation: Cardiac diet, fluid restriction 1.5 L/day  Activity: The patient is advised to gradually reintroduce usual activities, as tolerated  Discharge Condition: stable  Code Status: Full code   History of present illness: As per the H and P dictated on admission  Hospital Course:  Gina [name] is a 89 y.o. female with medical history significant of hypertension, hyperlipidemia, hypothyroidism, essential thrombocytosis, who presents to the ED due to abnormal labs.  Patient has history of off-and-on diarrhea and constipation, she used excessive laxatives within a week ago and since then she is having loose stools.  He has chronic nausea as well.  Patient was found to have low sodium level and she was sent to the ED. ED workup: Sodium level was 123, Serum osmolality 274, with urine sodium of 52 and urine osmolality of 286.  CT of the abdomen was obtained that showed no acute process. Nephrology was consulted with recommendation to start IV fluids. [facility] contacted for admission.    Assessment and Plan: # Hypotonic hyponatremia Serum osmolality 274, patient was drinking excessive water due to diarrhea Na 123--132, s/p IV NS given during hospital stay.  Sodium level gradually improved.  Patient was seen by nephrology, cleared for discharge and recommended to follow next week and repeat BMP. Continue  fluid restriction 1.5 L/day.  # Functional diarrhea vs IBS C. difficile and GI pathogen negative.  Continue oral hydration. S/p PPI, Carafate, probiotics.  Patient was discharged on pantoprazole 40 mg p.o. twice daily for 7 days followed by 20 mg p.o. daily, simethicone 40 mg p.o. 3 times daily for dyspepsia.  Patient was advised to avoid excessive laxatives if she does not needed.  Patient does have intermittent constipation.  Recommend to follow with GI as an outpatient. # UTI, s/p ceftriaxone, [place] Positive, but urine culture <10K colonies, insignificant growth.  Patient was discharged on prophylactic antibiotics, Augmentin twice daily for 3 days. # Hypertension and hyperlipidemia, continue home meds amlodipine 5 mg, irbesartan 150 mg daily, aspirin 81 mg, Lipitor 20 mg p.o. Patient was advised to monitor BP at home and follow with PCP. Hypothyroid, continue Synthroid   Body mass index is 24.72 kg/m.  Nutrition Interventions:   Patient was ambulatory without any assistance. On the day of the discharge the patient's vitals were stable, and no other acute medical condition were reported by patient. the patient was felt safe to be discharge at Home.  Consultants: Nephrology Procedures: None  Discharge Exam: General: Appear in no distress, no Rash; Oral Mucosa Clear, moist. Cardiovascular: S1 and S2 Present, no Murmur, Respiratory: normal respiratory effort, Bilateral Air entry present and no Crackles, no wheezes Abdomen: Bowel Sound present, Soft and no tenderness, no hernia Extremities: no Pedal edema, no calf tenderness Neurology: alert and oriented to time, place, and person affect appropriate.  Filed Weights   09/09/22 1218  Weight: 65.3 kg   Vitals:   09/11/22  0550 09/11/22 0820  BP: (!) 155/71 (!) 147/74  Pulse: 67 69  Resp: 20 17  Temp: 97.9 F (36.6 C) 97.7 F (36.5 C)  SpO2: 99% 100%    DISCHARGE MEDICATION: Allergies as of 09/11/2022       Reactions   Novocain  [procaine] Palpitations        Medication List     STOP taking these medications    famotidine 20 MG tablet Commonly known as: PEPCID   metoCLOPramide 5 MG tablet Commonly known as: Reglan   Systane 0.4-0.3 % Soln Generic drug: Polyethyl Glycol-Propyl Glycol       TAKE these medications    Acetaminophen 500 MG capsule Take by mouth every 4 (four) hours as needed for fever.   amLODipine 5 MG tablet Commonly known as: NORVASC Take 1 tablet (5 mg total) by mouth daily.   amoxicillin-clavulanate 875-125 MG tablet Commonly known as: AUGMENTIN Take 1 tablet by mouth 2 (two) times daily for 3 days.   aspirin EC 81 MG tablet Take 81 mg by mouth daily.   atorvastatin 20 MG tablet Commonly known as: LIPITOR TAKE 1 TABLET BY MOUTH AT BEDTIME   cyanocobalamin 1000 MCG tablet Commonly known as: VITAMIN B12 Take 1,000 mcg by mouth daily.   hydroxyurea 500 MG capsule Commonly known as: HYDREA TAKE 1 CAPSULE BY MOUTH 4 DAYS A WEEK What changed: See the new instructions.   irbesartan 150 MG tablet Commonly known as: AVAPRO Take 1 tablet by mouth once daily   ketoconazole 2 % shampoo Commonly known as: NIZORAL Apply 1 Application topically 3 (three) times a week. Wash scalp 3 times weekly, let sit 5 minutes and rinse out   levothyroxine 88 MCG tablet Commonly known as: SYNTHROID TAKE 1 TABLET BY MOUTH ONCE DAILY BEFORE BREAKFAST   mupirocin ointment 2 % Commonly known as: BACTROBAN Apply to affected area on back with bandage changes until healed.   nystatin ointment Commonly known as: MYCOSTATIN Apply 1 application topically 2 (two) times daily.   nystatin cream Commonly known as: MYCOSTATIN APPLY 1 APPLICATION TOPICALLY TWICE DAILY   pantoprazole 20 MG tablet Commonly known as: Protonix Take 1 tablet (20 mg total) by mouth 2 (two) times daily for 7 days, THEN 1 tablet (20 mg total) daily. Trial for 2 to 4 weeks. Start taking on: September 11, 2022 What  changed: See the new instructions.   simethicone 40 MG/0.6ML drops Commonly known as: MYLICON Take 0.6 mLs (40 mg total) by mouth 3 (three) times daily.   Vitamin D3 25 MCG (1000 UT) Caps Take by mouth daily.       Allergies  Allergen Reactions   Novocain [Procaine] Palpitations   Discharge Instructions     Call MD for:   Complete by: As directed    Diarrhea or constipation   Call MD for:  difficulty breathing, headache or visual disturbances   Complete by: As directed    Call MD for:  extreme fatigue   Complete by: As directed    Call MD for:  persistant dizziness or light-headedness   Complete by: As directed    Call MD for:  persistant nausea and vomiting   Complete by: As directed    Call MD for:  severe uncontrolled pain   Complete by: As directed    Call MD for:  temperature >100.4   Complete by: As directed    Diet - low sodium heart healthy   Complete by: As directed  Discharge instructions   Complete by: As directed    F/u PCP in 1 wk, repeat BMP in 1 wk F/u Nephro in 1 wk, BMP in 1 wk and Fluid restriction 1.5 L per day   Increase activity slowly   Complete by: As directed        The results of significant diagnostics from this hospitalization (including imaging, microbiology, ancillary and laboratory) are listed below for reference.    Significant Diagnostic Studies: CT ABDOMEN PELVIS W CONTRAST  Result Date: 09/09/2022 CLINICAL DATA:  Acute nonlocalized abdominal pain. Low-sodium. Nausea for a month. EXAM: CT ABDOMEN AND PELVIS WITH CONTRAST TECHNIQUE: Multidetector CT imaging of the abdomen and pelvis was performed using the standard protocol following bolus administration of intravenous contrast. RADIATION DOSE REDUCTION: This exam was performed according to the departmental dose-optimization program which includes automated exposure control, adjustment of the mA and/or kV according to patient size and/or use of iterative reconstruction technique.  CONTRAST:  80mL OMNIPAQUE IOHEXOL 300 MG/ML  SOLN COMPARISON:  08/25/2020 FINDINGS: Lower chest: Mild scarring in the lung bases. Hepatobiliary: No focal liver abnormality is seen. No gallstones, gallbladder wall thickening, or biliary dilatation. Pancreas: Unremarkable. No pancreatic ductal dilatation or surrounding inflammatory changes. Spleen: Normal in size without focal abnormality. Adrenals/Urinary Tract: No adrenal gland nodules. Large cysts in the right kidney measuring 8.7 cm diameter. No change since prior study. No imaging follow-up is indicated. No hydronephrosis or hydroureter. Bladder is unremarkable. Stomach/Bowel: Stomach, small bowel, and colon are not abnormally distended. No wall thickening or inflammatory changes. Scattered colonic diverticula without evidence of acute diverticulitis. Appendix is not identified. Vascular/Lymphatic: Aortic atherosclerosis. No enlarged abdominal or pelvic lymph nodes. Reproductive: Uterus and bilateral adnexa are unremarkable. A vaginal pessary is in place. Other: No abdominal wall hernia or abnormality. No abdominopelvic ascites. Musculoskeletal: Degenerative changes in the spine. No destructive bone lesions. Right total hip arthroplasty. IMPRESSION: 1. No acute process demonstrated in the abdomen or pelvis. No evidence of bowel obstruction or inflammation. 2. Aortic atherosclerosis. Electronically Signed   By: Burman Nieves M.D.   On: 09/09/2022 15:35   DG Chest 2 View  Result Date: 09/09/2022 CLINICAL DATA:  Weakness with intermittent chest discomfort. Nausea for a month. EXAM: CHEST - 2 VIEW COMPARISON:  10/14/2018 FINDINGS: Normal heart size and pulmonary vascularity. No focal airspace disease or consolidation in the lungs. No blunting of costophrenic angles. No pneumothorax. Mediastinal contours appear intact. Calcification of the aorta. Degenerative changes in the spine and shoulders. IMPRESSION: No active cardiopulmonary disease. Electronically  Signed   By: Burman Nieves M.D.   On: 09/09/2022 15:30    Microbiology: Recent Results (from the past 240 hour(s))  Urine Culture     Status: None   Collection Time: 09/08/22  3:55 PM   Specimen: Urine  Result Value Ref Range Status   MICRO NUMBER: [id number]  Final   SPECIMEN QUALITY: Adequate  Final   Sample Source URINE  Final   STATUS: FINAL  Final   Result: No Growth  Final  MRSA Next Gen by PCR, Nasal     Status: None   Collection Time: 09/09/22  8:21 PM   Specimen: Nasal Mucosa; Nasal Swab  Result Value Ref Range Status   MRSA by PCR Next Gen NOT DETECTED NOT DETECTED Final    Comment: (NOTE) The GeneXpert MRSA Assay (FDA approved for NASAL specimens only), is one component of a comprehensive MRSA colonization surveillance program. It is not intended to diagnose MRSA infection nor  to guide or monitor treatment for MRSA infections. Test performance is not FDA approved in patients less than 73 years old. Performed at [facility], [address] [place], [place] [address]   C Difficile Quick Screen w PCR reflex     Status: None   Collection Time: 09/10/22 11:00 AM   Specimen: STOOL  Result Value Ref Range Status   C Diff antigen NEGATIVE NEGATIVE Final   C Diff toxin NEGATIVE NEGATIVE Final   C Diff interpretation No C. difficile detected.  Final    Comment: Performed at [facility], [address] [place], [place] [address]  Gastrointestinal Panel by PCR , Stool     Status: None   Collection Time: 09/10/22 11:00 AM   Specimen: STOOL  Result Value Ref Range Status   Campylobacter species NOT DETECTED NOT DETECTED Final   Plesimonas shigelloides NOT DETECTED NOT DETECTED Final   Salmonella species NOT DETECTED NOT DETECTED Final   Yersinia enterocolitica NOT DETECTED NOT DETECTED Final   Vibrio species NOT DETECTED NOT DETECTED Final   Vibrio cholerae NOT DETECTED NOT DETECTED Final   Enteroaggregative E coli (EAEC) NOT DETECTED NOT DETECTED  Final   Enteropathogenic E coli (EPEC) NOT DETECTED NOT DETECTED Final   Enterotoxigenic E coli (ETEC) NOT DETECTED NOT DETECTED Final   Shiga like toxin producing E coli (STEC) NOT DETECTED NOT DETECTED Final   Shigella/Enteroinvasive E coli (EIEC) NOT DETECTED NOT DETECTED Final   Cryptosporidium NOT DETECTED NOT DETECTED Final   Cyclospora cayetanensis NOT DETECTED NOT DETECTED Final   Entamoeba histolytica NOT DETECTED NOT DETECTED Final   Giardia lamblia NOT DETECTED NOT DETECTED Final   Adenovirus F40/41 NOT DETECTED NOT DETECTED Final   Astrovirus NOT DETECTED NOT DETECTED Final   Norovirus GI/GII NOT DETECTED NOT DETECTED Final   Rotavirus A NOT DETECTED NOT DETECTED Final   Sapovirus (I, II, IV, and V) NOT DETECTED NOT DETECTED Final    Comment: Performed at [facility], [address] [place], [place] [address]     Labs: CBC: Recent Labs  Lab 09/08/22 1555 09/09/22 1325 09/11/22 0457  WBC 10.4 10.1 9.1  NEUTROABS 8,559* 8.1*  --   HGB 14.5 15.1* 13.2  HCT 41.0 44.1 38.0  MCV 90.3 93.8 92.7  PLT 515* 527* 461*   Basic Metabolic Panel: Recent Labs  Lab 09/08/22 1555 09/09/22 1124 09/09/22 1325 09/10/22 0521 09/10/22 1418 09/11/22 0457  NA 123* 125* 125* 131* 128* 132*  K 4.8 4.0 4.3 3.8 4.3 4.1  CL 89* 93* 93* 101 100 102  CO2 24 22 21* 22 22 23   GLUCOSE 118* 146* 119* 100* 164* 104*  BUN 21 25* 27* 23 20 21   CREATININE 1.02* 1.10* 1.07* 0.89 0.98 0.95  CALCIUM 10.0 9.4 9.4 8.8* 8.8* 8.9  MG 1.9  --   --   --   --  1.9  PHOS  --   --   --   --   --  2.4*   Liver Function Tests: Recent Labs  Lab 09/08/22 1555 09/09/22 1124  AST 19 20  ALT 15 16  ALKPHOS  --  76  BILITOT 1.5* 1.9*  PROT 7.6 7.8  ALBUMIN  --  4.4   No results for input(s): "LIPASE", "AMYLASE" in the last 168 hours. No results for input(s): "AMMONIA" in the last 168 hours. Cardiac Enzymes: No results for input(s): "CKTOTAL", "CKMB", "CKMBINDEX", "TROPONINI" in the last  168 hours. BNP (last 3  results) Recent Labs    09/09/22 1325  BNP 133.1*   CBG: No results for input(s): "GLUCAP" in the last 168 hours.  Time spent: 35 minutes  Signed:  Gillis Santa  [facility] 09/11/2022 12:13 PM

## 2022-09-14 DIAGNOSIS — N1832 Chronic kidney disease, stage 3b: Secondary | ICD-10-CM | POA: Diagnosis not present

## 2022-09-14 DIAGNOSIS — E871 Hypo-osmolality and hyponatremia: Secondary | ICD-10-CM | POA: Diagnosis not present

## 2022-09-14 DIAGNOSIS — I129 Hypertensive chronic kidney disease with stage 1 through stage 4 chronic kidney disease, or unspecified chronic kidney disease: Secondary | ICD-10-CM | POA: Diagnosis not present

## 2022-09-23 ENCOUNTER — Ambulatory Visit: Payer: Medicare HMO | Admitting: Family Medicine

## 2022-09-25 ENCOUNTER — Emergency Department: Payer: Medicare HMO

## 2022-09-25 ENCOUNTER — Other Ambulatory Visit: Payer: Self-pay

## 2022-09-25 ENCOUNTER — Emergency Department
Admission: EM | Admit: 2022-09-25 | Discharge: 2022-09-25 | Disposition: A | Discharge status: Home / Self Care | Payer: Medicare HMO | Attending: Emergency Medicine | Admitting: Emergency Medicine

## 2022-09-25 DIAGNOSIS — Z743 Need for continuous supervision: Secondary | ICD-10-CM | POA: Diagnosis not present

## 2022-09-25 DIAGNOSIS — R1013 Epigastric pain: Secondary | ICD-10-CM | POA: Insufficient documentation

## 2022-09-25 DIAGNOSIS — R110 Nausea: Secondary | ICD-10-CM | POA: Diagnosis not present

## 2022-09-25 DIAGNOSIS — I10 Essential (primary) hypertension: Secondary | ICD-10-CM | POA: Diagnosis not present

## 2022-09-25 DIAGNOSIS — R531 Weakness: Secondary | ICD-10-CM | POA: Diagnosis not present

## 2022-09-25 DIAGNOSIS — R079 Chest pain, unspecified: Secondary | ICD-10-CM | POA: Diagnosis not present

## 2022-09-25 LAB — COMPREHENSIVE METABOLIC PANEL
ALT: 15 U/L (ref 0–44)
AST: 21 U/L (ref 15–41)
Albumin: 4.2 g/dL (ref 3.5–5.0)
Alkaline Phosphatase: 73 U/L (ref 38–126)
Anion gap: 11 (ref 5–15)
BUN: 23 mg/dL (ref 8–23)
CO2: 21 mmol/L — ABNORMAL LOW (ref 22–32)
Calcium: 9.6 mg/dL (ref 8.9–10.3)
Chloride: 100 mmol/L (ref 98–111)
Creatinine, Ser: 0.95 mg/dL (ref 0.44–1.00)
GFR, Estimated: 56 mL/min — ABNORMAL LOW (ref >60)
Glucose, Bld: 137 mg/dL — ABNORMAL HIGH (ref 70–99)
Potassium: 4.3 mmol/L (ref 3.5–5.1)
Sodium: 132 mmol/L — ABNORMAL LOW (ref 135–145)
Total Bilirubin: 1.2 mg/dL (ref 0.3–1.2)
Total Protein: 7.3 g/dL (ref 6.5–8.1)

## 2022-09-25 LAB — CBC
HCT: 42.2 % (ref 36.0–46.0)
Hemoglobin: 14.2 g/dL (ref 12.0–15.0)
MCH: 32 pg (ref 26.0–34.0)
MCHC: 33.6 g/dL (ref 30.0–36.0)
MCV: 95 fL (ref 80.0–100.0)
Platelets: 520 10*3/uL — ABNORMAL HIGH (ref 150–400)
RBC: 4.44 MIL/uL (ref 3.87–5.11)
RDW: 13.9 % (ref 11.5–15.5)
WBC: 8.1 10*3/uL (ref 4.0–10.5)
nRBC: 0 % (ref 0.0–0.2)

## 2022-09-25 LAB — TROPONIN I (HIGH SENSITIVITY): Troponin I (High Sensitivity): 7 ng/L (ref <18)

## 2022-09-25 NOTE — ED Triage Notes (Signed)
First Nurse note: pt BIB ems from [facility] with c/o possible low sodium levels.  Pt admitted here on 7/17 for hyponatremia.  No other complaints oer ems.

## 2022-09-25 NOTE — ED Provider Notes (Signed)
[facility] Provider Note   Event Date/Time   First MD Initiated Contact with Patient 09/25/22 [phone]     (approximate) History  Nausea  HPI Gina [name] is a 89 y.o. female with a stated past medical history of GERD who presents from long-term care facility via EMS with complaints of nausea and concern for low sodium level.  Patient states that she had similar symptoms on 7/17 when she was told her sodium was too low.  Patient also endorses dry mouth and occasional chest pain.  Patient is otherwise in no acute distress and without any other complaints.  Patient states that this nausea is worse after taking her medications.  Patient states that this sensation is similar to previous episodes of GERD that she has had in the past. ROS: Patient currently denies any vision changes, tinnitus, difficulty speaking, facial droop, sore throat, chest pain, shortness of breath, abdominal pain, vomiting/diarrhea, dysuria, or weakness/numbness/paresthesias in any extremity   Physical Exam  Triage Vital Signs: ED Triage Vitals  Encounter Vitals Group     BP 09/25/22 0703 124/76     Systolic BP Percentile --      Diastolic BP Percentile --      Pulse Rate 09/25/22 0703 99     Resp 09/25/22 0703 16     Temp 09/25/22 0703 (!) 97.5 F (36.4 C)     Temp Source 09/25/22 0703 Oral     SpO2 09/25/22 0703 100 %     Weight 09/25/22 0706 143 lb 4.8 oz (65 kg)     Height 09/25/22 0706 5\' 4"  (1.626 m)     Head Circumference --      Peak Flow --      Pain Score 09/25/22 0706 0     Pain Loc --      Pain Education --      Exclude from Growth Chart --    Most recent vital signs: Vitals:   09/25/22 0915 09/25/22 0925  BP:    Pulse: 64 64  Resp: 17   Temp:    SpO2: 99% 100%   General: Awake, oriented x4. CV:  Good peripheral perfusion.  Resp:  Normal effort.  Abd:  No distention.  Other:  Elderly well-developed, well-nourished Caucasian female laying in bed in no acute  distress ED Results / Procedures / Treatments  Labs (all labs ordered are listed, but only abnormal results are displayed) Labs Reviewed  CBC - Abnormal; Notable for the following components:      Result Value   Platelets 520 (*)    All other components within normal limits  COMPREHENSIVE METABOLIC PANEL - Abnormal; Notable for the following components:   Sodium 132 (*)    CO2 21 (*)    Glucose, Bld 137 (*)    GFR, Estimated 56 (*)    All other components within normal limits  TROPONIN I (HIGH SENSITIVITY)   EKG ED ECG REPORT I, Merwyn Katos, the attending physician, personally viewed and interpreted this ECG. Date: 09/25/2022 EKG Time: 0716 Rate: 85 Rhythm: normal sinus rhythm QRS Axis: normal Intervals: normal ST/T Wave abnormalities: normal Narrative Interpretation: no evidence of acute ischemia RADIOLOGY ED MD interpretation: One-view portable chest x-ray interpreted by me shows no evidence of acute abnormalities including no pneumonia, pneumothorax, or widened mediastinum -Agree with radiology assessment Official radiology report(s): DG Chest 1 View  Result Date: 09/25/2022 CLINICAL DATA:  Chest pain EXAM: CHEST  1 VIEW COMPARISON:  Chest x-ray dated  September 09, 2022 FINDINGS: The heart size and mediastinal contours are within normal limits. Both lungs are clear. The visualized skeletal structures are unremarkable. IMPRESSION: No active disease. Electronically Signed   By: Allegra Lai M.D.   On: 09/25/2022 08:12   PROCEDURES: Critical Care performed: No .1-3 Lead EKG Interpretation  Performed by: Merwyn Katos, MD Authorized by: Merwyn Katos, MD     Interpretation: normal     ECG rate:  71   ECG rate assessment: normal     Rhythm: sinus rhythm     Ectopy: none     Conduction: normal    MEDICATIONS ORDERED IN ED: Medications - No data to display IMPRESSION / MDM / ASSESSMENT AND PLAN / ED COURSE  I reviewed the triage vital signs and the nursing notes.                              he patient is on the cardiac monitor to evaluate for evidence of arrhythmia and/or significant heart rate changes. Patient's presentation is most consistent with acute presentation with potential threat to life or bodily function. Patients symptoms not typical for emergent causes of abdominal pain such as, but not limited to, appendicitis, abdominal aortic aneurysm, surgical biliary disease, pancreatitis, SBO, mesenteric ischemia, serious intra-abdominal bacterial illness. Presentation also not typical of gynecologic emergencies such as TOA, Ovarian Torsion, PID. Not Ectopic. Doubt atypical ACS.  Pt tolerating PO. Disposition: Patient will be discharged with strict return precautions and follow up with primary MD within 12-24 hours for further evaluation. Patient understands that this still may have an early presentation of an emergent medical condition such as appendicitis that will require a recheck.   FINAL CLINICAL IMPRESSION(S) / ED DIAGNOSES   Final diagnoses:  Nausea  Epigastric pain   Rx / DC Orders   ED Discharge Orders     None      Note:  This document was prepared using Dragon voice recognition software and may include unintentional dictation errors.   Merwyn Katos, MD 09/25/22 [phone]

## 2022-09-25 NOTE — ED Triage Notes (Signed)
Pt presents to ER via ems from [place] with c/o nausea and possible low sodium level.  Pt reports being admitted for hypo Na+ on 7/17.  States she feels similar to then.  Pt endorses feeling some tightness in her chest, along with some dry mouth.  Pt is otherwise A&O x4 and in NAD.

## 2022-09-30 ENCOUNTER — Ambulatory Visit (INDEPENDENT_AMBULATORY_CARE_PROVIDER_SITE_OTHER): Payer: Medicare HMO | Admitting: Family Medicine

## 2022-09-30 ENCOUNTER — Encounter: Payer: Self-pay | Admitting: Family Medicine

## 2022-09-30 VITALS — BP 124/72 | HR 86 | Temp 98.0°F | Resp 16 | Ht 62.0 in | Wt 147.4 lb

## 2022-09-30 DIAGNOSIS — K219 Gastro-esophageal reflux disease without esophagitis: Secondary | ICD-10-CM

## 2022-09-30 DIAGNOSIS — E890 Postprocedural hypothyroidism: Secondary | ICD-10-CM

## 2022-09-30 DIAGNOSIS — Z5181 Encounter for therapeutic drug level monitoring: Secondary | ICD-10-CM

## 2022-09-30 DIAGNOSIS — E871 Hypo-osmolality and hyponatremia: Secondary | ICD-10-CM

## 2022-09-30 DIAGNOSIS — G629 Polyneuropathy, unspecified: Secondary | ICD-10-CM

## 2022-09-30 DIAGNOSIS — R531 Weakness: Secondary | ICD-10-CM | POA: Diagnosis not present

## 2022-09-30 DIAGNOSIS — R110 Nausea: Secondary | ICD-10-CM

## 2022-09-30 DIAGNOSIS — Z09 Encounter for follow-up examination after completed treatment for conditions other than malignant neoplasm: Secondary | ICD-10-CM | POA: Diagnosis not present

## 2022-09-30 MED ORDER — PANTOPRAZOLE SODIUM 40 MG PO TBEC
40.0000 mg | DELAYED_RELEASE_TABLET | Freq: Every day | ORAL | 1 refills | Status: DC
Start: 2022-09-30 — End: 2023-03-23

## 2022-09-30 MED ORDER — FAMOTIDINE 20 MG PO TABS: 20.0000 mg | ORAL_TABLET | Freq: Two times a day (BID) | ORAL | 1 refills | Status: DC | PRN

## 2022-09-30 NOTE — Progress Notes (Signed)
Patient ID: Gina [name], female    DOB: 1926/12/10, 89 y.o.   MRN: [id number]  PCP: [name], PA-C  Chief Complaint  Patient presents with   Follow-up    Pt states feeling better    Subjective:   Gina [name] is a 89 y.o. female, presents to clinic with CC of the following:  HPI  Pt presented last month with various sx and was subsequently sent to the ED She was admitted for hyponatremia 7/17-7/19 She went to the ER 5 d ago for nausea as well - GI upset, indigestion  Date of admission: 09/09/2022             Date of discharge: 09/11/2022       Discharge Diagnoses:  Principal Problem:   Hyponatremia Active Problems:   UTI (urinary tract infection)   Essential hypertension   Hypothyroidism   Stage 3b chronic kidney disease (HCC)   GERD without esophagitis   Essential thrombocytosis (HCC)     Admitted From: Home Disposition:  Home   Recommendations for Outpatient Follow-up:  PCP: in 1 wk Nephrologist in 1 week Follow up LABS/TEST: BMP in 1 week     Diet recommendation: Cardiac diet, fluid restriction 1.5 L/day   In Ed 5 days ago CMP was done and sodium was 132     Patient Active Problem List   Diagnosis Date Noted   Hyponatremia 09/09/2022   UTI (urinary tract infection) 09/09/2022   Prediabetes 06/20/2021   Paresthesia of upper and lower extremities of both sides 03/20/2020   Requires assistance with activities of daily living (ADL) 03/20/2020   Mixed hyperlipidemia 03/19/2020   Anxiety with depression 03/19/2020   Degenerative disc disease, cervical 05/10/2019   De Quervain's tenosynovitis, left 05/10/2019   Bilateral hand numbness 05/10/2019   DNR (do not resuscitate) 04/22/2018   Venous stasis 04/21/2018   Cystocele with prolapse 04/01/2017   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   Essential thrombocytosis (HCC) 07/23/2015   Mild atherosclerosis of carotid artery 06/25/2015   Dyslipidemia 06/03/2015   Diverticulosis 05/28/2015   GERD without  esophagitis 02/06/2015   Neuropathy 01/31/2015   Cervical radiculopathy 01/31/2015   Stage 3b chronic kidney disease (HCC) 10/06/2014   Renal cyst 10/06/2014   Uterine prolapse 10/06/2014   Cervical radiculopathy due to degenerative joint disease of spine 10/06/2014   History of right hip replacement 10/06/2014   Hypothyroidism 10/05/2014   Essential hypertension 09/13/2014      Current Outpatient Medications:    Acetaminophen 500 MG coapsule, Take by mouth every 4 (four) hours as needed for fever., Disp: , Rfl:    amLODipine (NORVASC) 5 MG tablet, Take 1 tablet (5 mg total) by mouth daily., Disp: 90 tablet, Rfl: 1   aspirin EC 81 MG tablet, Take 81 mg by mouth daily. , Disp: , Rfl:    atorvastatin (LIPITOR) 20 MG tablet, TAKE 1 TABLET BY MOUTH AT BEDTIME, Disp: 90 tablet, Rfl: 0   Cholecalciferol (VITAMIN D3) 1000 units CAPS, Take by mouth daily. , Disp: , Rfl:    cyanocobalamin (VITAMIN B12) 1000 MCG tablet, Take 1,000 mcg by mouth daily., Disp: , Rfl:    hydroxyurea (HYDREA) 500 MG capsule, TAKE 1 CAPSULE BY MOUTH 4 DAYS A WEEK (Patient taking differently: TAKE 1 CAPSULE BY MOUTH 4 DAYS A WEEK Monday , wednesday, Friday, Saturday.), Disp: 60 capsule, Rfl: 5   irbesartan (AVAPRO) 150 MG tablet, Take 1 tablet by mouth once daily, Disp: 90 tablet, Rfl: 0   ketoconazole (  NIZORAL) 2 % shampoo, Apply 1 Application topically 3 (three) times a week. Wash scalp 3 times weekly, let sit 5 minutes and rinse out, Disp: 120 mL, Rfl: 11   levothyroxine (SYNTHROID) 88 MCG tablet, TAKE 1 TABLET BY MOUTH ONCE DAILY BEFORE BREAKFAST, Disp: 90 tablet, Rfl: 2   mupirocin ointment (BACTROBAN) 2 %, Apply to affected area on back with bandage changes until healed., Disp: 22 g, Rfl: 0   nystatin cream (MYCOSTATIN), APPLY 1 APPLICATION TOPICALLY TWICE DAILY, Disp: 30 g, Rfl: 0   nystatin ointment (MYCOSTATIN), Apply 1 application topically 2 (two) times daily., Disp: 30 g, Rfl: 0   pantoprazole (PROTONIX) 20 MG  tablet, Take 1 tablet (20 mg total) by mouth 2 (two) times daily for 7 days, THEN 1 tablet (20 mg total) daily. Trial for 2 to 4 weeks., Disp: 44 tablet, Rfl: 0   simethicone (MYLICON) 40 MG/0.6ML drops, Take 0.6 mLs (40 mg total) by mouth 3 (three) times daily., Disp: 30 mL, Rfl: 0   Allergies  Allergen Reactions   Novocain [Procaine] Palpitations     Social History   Tobacco Use   Smoking status: Former   Smokeless tobacco: Never  Vaping Use   Vaping status: Never Used  Substance Use Topics   Alcohol use: No    Alcohol/week: 0.0 standard drinks of alcohol   Drug use: No      Chart Review Today: I personally reviewed active problem list, medication list, allergies, family history, social history, health maintenance, notes from last encounter, lab results, imaging with the patient/caregiver today.    Review of Systems  Constitutional: Negative.   HENT: Negative.    Eyes: Negative.   Respiratory: Negative.    Cardiovascular: Negative.   Gastrointestinal: Negative.   Endocrine: Negative.   Genitourinary: Negative.   Musculoskeletal: Negative.   Skin: Negative.   Allergic/Immunologic: Negative.   Neurological: Negative.   Hematological: Negative.   Psychiatric/Behavioral: Negative.    All other systems reviewed and are negative.      Objective:   Vitals:   09/30/22 1053  BP: 124/72  Pulse: 86  Resp: 16  Temp: 98 F (36.7 C)  TempSrc: Oral  SpO2: 96%  Weight: 147 lb 6.4 oz (66.9 kg)  Height: 5\' 2"  (1.575 m)    Body mass index is 26.96 kg/m.  Physical Exam Vitals and nursing note reviewed.  Constitutional:      General: She is not in acute distress.    Appearance: Normal appearance. She is not ill-appearing, toxic-appearing or diaphoretic.  HENT:     Head: Normocephalic and atraumatic.     Right Ear: External ear normal.     Left Ear: External ear normal.  Cardiovascular:     Rate and Rhythm: Normal rate and regular rhythm.     Pulses: Normal  pulses.  Pulmonary:     Effort: No respiratory distress.  Skin:    General: Skin is warm.     Capillary Refill: Capillary refill takes less than 2 seconds.  Neurological:     Mental Status: She is alert.     Gait: Gait abnormal (using walker).  Psychiatric:        Mood and Affect: Mood normal.        Behavior: Behavior normal.      Results for orders placed or performed during the hospital encounter of 09/25/22  CBC  Result Value Ref Range   WBC 8.1 4.0 - 10.5 K/uL   RBC 4.44 3.87 - 5.11  MIL/uL   Hemoglobin 14.2 12.0 - 15.0 g/dL   HCT 40.9 81.1 - 91.4 %   MCV 95.0 80.0 - 100.0 fL   MCH 32.0 26.0 - 34.0 pg   MCHC 33.6 30.0 - 36.0 g/dL   RDW 78.2 95.6 - 21.3 %   Platelets 520 (H) 150 - 400 K/uL   nRBC 0.0 0.0 - 0.2 %  Comprehensive metabolic panel  Result Value Ref Range   Sodium 132 (L) 135 - 145 mmol/L   Potassium 4.3 3.5 - 5.1 mmol/L   Chloride 100 98 - 111 mmol/L   CO2 21 (L) 22 - 32 mmol/L   Glucose, Bld 137 (H) 70 - 99 mg/dL   BUN 23 8 - 23 mg/dL   Creatinine, Ser 0.86 0.44 - 1.00 mg/dL   Calcium 9.6 8.9 - 57.8 mg/dL   Total Protein 7.3 6.5 - 8.1 g/dL   Albumin 4.2 3.5 - 5.0 g/dL   AST 21 15 - 41 U/L   ALT 15 0 - 44 U/L   Alkaline Phosphatase 73 38 - 126 U/L   Total Bilirubin 1.2 0.3 - 1.2 mg/dL   GFR, Estimated 56 (L) >60 mL/min   Anion gap 11 5 - 15  Troponin I (High Sensitivity)  Result Value Ref Range   Troponin I (High Sensitivity) 7 <18 ng/L       Assessment & Plan:      ICD-10-CM   1. Postoperative hypothyroidism  E89.0    will recheck thyroid next OV with variety of new GERD/nausea meds    2. Weakness  R53.1    improving    3. Neuropathy  G62.9    stable and chronic    4. Hyponatremia  E87.1    last sodium 132, she has outpt f/up with nephrology Dr. Thedore Mins, limited fluid intake to 1.5 L    5. Nausea  R11.0 famotidine (PEPCID) 20 MG tablet    pantoprazole (PROTONIX) 40 MG tablet   improving    6. Gastroesophageal reflux disease,  unspecified whether esophagitis present  K21.9 famotidine (PEPCID) 20 MG tablet    pantoprazole (PROTONIX) 40 MG tablet   better with food modification and PPI, will change PPi dose to high and once daily, can use pepcid prn, f/up in a few months    7. Encounter for examination following treatment at hospital  Z09    admission, labs, f/up/discharge summary and ED visit and all results and pertinent things reviewed extensively today    8. Encounter for medication monitoring  Z51.81      Forms filled out for pt for her living assisted facility Meds thoroughly reviewed and f/up plan  [name], PA-C 09/30/22 11:05 AM

## 2022-10-02 ENCOUNTER — Telehealth: Payer: Self-pay

## 2022-10-02 NOTE — Telephone Encounter (Signed)
Transition Care Management Unsuccessful Follow-up Telephone Call  Date of discharge and from where:  [place] 8/2  Attempts:  2nd Attempt  Reason for unsuccessful TCM follow-up call:  No answer/busy   Gina [name] [facility] Guide, [facility] [phone] [address] [place], [place], [place] [address] Phone: [phone] Email: [email or web address]

## 2022-10-02 NOTE — Telephone Encounter (Signed)
Transition Care Management Unsuccessful Follow-up Telephone Call  Date of discharge and from where:  [place] 8/2  Attempts:  1st Attempt  Reason for unsuccessful TCM follow-up call:  No answer/busy   Lenard Forth [facility] Guide, [facility] [phone] [address] [place], [place], [place] [address] Phone: [phone] Email: [email or web address]

## 2022-10-05 ENCOUNTER — Encounter: Payer: Self-pay | Admitting: Obstetrics & Gynecology

## 2022-10-05 ENCOUNTER — Ambulatory Visit: Payer: Medicare HMO | Admitting: Obstetrics & Gynecology

## 2022-10-05 VITALS — BP 127/76 | HR 86 | Ht 62.0 in | Wt 148.0 lb

## 2022-10-05 DIAGNOSIS — Z4689 Encounter for fitting and adjustment of other specified devices: Secondary | ICD-10-CM

## 2022-10-05 DIAGNOSIS — N814 Uterovaginal prolapse, unspecified: Secondary | ICD-10-CM | POA: Diagnosis not present

## 2022-10-05 NOTE — Progress Notes (Signed)
    GYNECOLOGY PROGRESS NOTE  Subjective:    Patient ID: Gina [name], female    DOB: 19-May-1926, 89 y.o.   MRN: [id number]  HPI  Patient is a 89 y.o. [id number] who presents for pessary maintenance. She reports no difficulties with her pessary.  The following portions of the patient's history were reviewed and updated as appropriate: allergies, current medications, past family history, past medical history, past social history, past surgical history, and problem list.  Review of Systems Pertinent items are noted in HPI.   Objective:   Blood pressure 127/76, pulse 86, height 5\' 2"  (1.575 m), weight 148 lb (67.1 kg). Body mass index is 27.07 kg/m. General appearance: alert Abdomen: soft, non-tender; bowel sounds normal; no masses,  no organomegaly Pelvic: external genitalia normal, no adnexal masses or tenderness, and vagina normal without discharge Extremities: extremities normal, atraumatic, no cyanosis or edema Neurologic: Grossly normal  I removed, cleaned, and replaced her pessary. No excoriations noted with speculum exam.  Assessment:   1. Pessary maintenance   2. Cystocele with prolapse      Plan:   Come back in 3 months/prn sooner

## 2022-10-07 DIAGNOSIS — I129 Hypertensive chronic kidney disease with stage 1 through stage 4 chronic kidney disease, or unspecified chronic kidney disease: Secondary | ICD-10-CM | POA: Diagnosis not present

## 2022-10-07 DIAGNOSIS — N1831 Chronic kidney disease, stage 3a: Secondary | ICD-10-CM | POA: Diagnosis not present

## 2022-10-07 DIAGNOSIS — N281 Cyst of kidney, acquired: Secondary | ICD-10-CM | POA: Diagnosis not present

## 2022-10-07 DIAGNOSIS — E871 Hypo-osmolality and hyponatremia: Secondary | ICD-10-CM | POA: Diagnosis not present

## 2022-10-24 ENCOUNTER — Other Ambulatory Visit: Payer: Self-pay | Admitting: Family Medicine

## 2022-10-24 DIAGNOSIS — E782 Mixed hyperlipidemia: Secondary | ICD-10-CM

## 2022-10-30 ENCOUNTER — Inpatient Hospital Stay: Payer: Medicare HMO | Attending: Oncology

## 2022-10-30 ENCOUNTER — Inpatient Hospital Stay (HOSPITAL_BASED_OUTPATIENT_CLINIC_OR_DEPARTMENT_OTHER): Payer: Medicare HMO | Admitting: Oncology

## 2022-10-30 ENCOUNTER — Encounter: Payer: Self-pay | Admitting: Oncology

## 2022-10-30 VITALS — BP 119/98 | HR 88 | Temp 96.8°F | Resp 16 | Ht 62.0 in | Wt 148.0 lb

## 2022-10-30 DIAGNOSIS — D473 Essential (hemorrhagic) thrombocythemia: Secondary | ICD-10-CM | POA: Insufficient documentation

## 2022-10-30 DIAGNOSIS — E871 Hypo-osmolality and hyponatremia: Secondary | ICD-10-CM | POA: Diagnosis not present

## 2022-10-30 DIAGNOSIS — Z79899 Other long term (current) drug therapy: Secondary | ICD-10-CM | POA: Insufficient documentation

## 2022-10-30 DIAGNOSIS — N289 Disorder of kidney and ureter, unspecified: Secondary | ICD-10-CM | POA: Diagnosis not present

## 2022-10-30 LAB — CMP (CANCER CENTER ONLY)
ALT: 15 U/L (ref 0–44)
AST: 19 U/L (ref 15–41)
Albumin: 4.4 g/dL (ref 3.5–5.0)
Alkaline Phosphatase: 86 U/L (ref 38–126)
Anion gap: 8 (ref 5–15)
BUN: 22 mg/dL (ref 8–23)
CO2: 24 mmol/L (ref 22–32)
Calcium: 9.5 mg/dL (ref 8.9–10.3)
Chloride: 99 mmol/L (ref 98–111)
Creatinine: 1.1 mg/dL — ABNORMAL HIGH (ref 0.44–1.00)
GFR, Estimated: 47 mL/min — ABNORMAL LOW (ref >60)
Glucose, Bld: 104 mg/dL — ABNORMAL HIGH (ref 70–99)
Potassium: 4.5 mmol/L (ref 3.5–5.1)
Sodium: 131 mmol/L — ABNORMAL LOW (ref 135–145)
Total Bilirubin: 1 mg/dL (ref 0.3–1.2)
Total Protein: 7.5 g/dL (ref 6.5–8.1)

## 2022-10-30 LAB — CBC WITH DIFFERENTIAL/PLATELET
Abs Immature Granulocytes: 0.05 10*3/uL (ref 0.00–0.07)
Basophils Absolute: 0 10*3/uL (ref 0.0–0.1)
Basophils Relative: 1 %
Eosinophils Absolute: 0.1 10*3/uL (ref 0.0–0.5)
Eosinophils Relative: 2 %
HCT: 41.8 % (ref 36.0–46.0)
Hemoglobin: 13.8 g/dL (ref 12.0–15.0)
Immature Granulocytes: 1 %
Lymphocytes Relative: 15 %
Lymphs Abs: 1.1 10*3/uL (ref 0.7–4.0)
MCH: 31.7 pg (ref 26.0–34.0)
MCHC: 33 g/dL (ref 30.0–36.0)
MCV: 96.1 fL (ref 80.0–100.0)
Monocytes Absolute: 0.6 10*3/uL (ref 0.1–1.0)
Monocytes Relative: 9 %
Neutro Abs: 5.3 10*3/uL (ref 1.7–7.7)
Neutrophils Relative %: 72 %
Platelets: 555 10*3/uL — ABNORMAL HIGH (ref 150–400)
RBC: 4.35 MIL/uL (ref 3.87–5.11)
RDW: 13.7 % (ref 11.5–15.5)
WBC: 7.3 10*3/uL (ref 4.0–10.5)
nRBC: 0 % (ref 0.0–0.2)

## 2022-10-30 NOTE — Progress Notes (Signed)
[facility]  Telephone:(336) [phone] Fax:(336) [phone]  ID: Gina [name] OB: 1926-05-30  MR#: [id number]  AOZ#:[id number]  Patient Care Team: [name], PA-C as PCP - General (Family Medicine) [name], MD (Internal Medicine) [name], MD as Referring Physician (Obstetrics and Gynecology) [name], MD (Ophthalmology) [name], MD as Referring Physician (Neurology) [name], MD (Otolaryngology) [name], MD (Dermatology) [name], MD as Consulting Physician (Oncology)  CHIEF COMPLAINT: Essential thrombocytosis  INTERVAL HISTORY: Patient returns to clinic today for repeat laboratory work and routine 2-month evaluation.  She continues to feel well and remains asymptomatic.  She is tolerating Hydrea without significant side effects. She has no neurologic complaints.  She denies any recent fevers or illnesses.  She has a good appetite and denies weight loss.  She denies any chest pain, shortness of breath, cough, or hemoptysis.  She denies any nausea, vomiting, constipation, or diarrhea.  She has no urinary complaints.  Patient offers no specific complaints today.  REVIEW OF SYSTEMS:   Review of Systems  Constitutional: Negative.  Negative for fever, malaise/fatigue and weight loss.  Respiratory: Negative.  Negative for cough, hemoptysis and shortness of breath.   Cardiovascular: Negative.  Negative for chest pain and leg swelling.  Gastrointestinal: Negative.  Negative for abdominal pain.  Genitourinary: Negative.  Negative for dysuria.  Musculoskeletal: Negative.  Negative for back pain.  Skin: Negative.  Negative for rash.  Neurological: Negative.  Negative for focal weakness, weakness and headaches.  Psychiatric/Behavioral: Negative.  The patient is not nervous/anxious.     As per HPI. Otherwise, a complete review of systems is negative.  PAST MEDICAL HISTORY: Past Medical History:  Diagnosis Date    Breast mass in female    right breast   Cataract    Difficult intubation 03/03/2016   September 06, 2013; left nasal fiberoptic intubation #7 ETT; see letter from Dr. 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 Merlene Pulling   Hemorrhoids 07/29/2016   Hyperlipemia    Hyperlipidemia    history of    Hypothyroidism    Mild atherosclerosis of carotid artery 06/25/2015   Morton's neuroma    MRI contraindicated due to metal implant    Prolapse of uterus    Radiculopathy    Renal cyst    Rosacea    Shingles    Thrombocytosis     PAST SURGICAL HISTORY: Past Surgical History:  Procedure Laterality Date   ADENOIDECTOMY     APPENDECTOMY     biopsy, right breast      CATARACT EXTRACTION W/PHACO Left 03/25/2015   Procedure: CATARACT EXTRACTION PHACO AND INTRAOCULAR LENS PLACEMENT (IOC);  Surgeon: [name], MD;  Location: [facility] ORS;  Service: Ophthalmology;  Laterality: Left;  [place] 02:09 AP% 27.3 CDE 59.97 fluid pack lot #[id number]   CESAREAN SECTION     COLON SURGERY     JOINT REPLACEMENT Right    total hip   THYROID LOBECTOMY     THYROIDECTOMY     partial   TONSILLECTOMY     TOTAL HIP ARTHROPLASTY     UPPER GI ENDOSCOPY  10/17/2008    FAMILY HISTORY: Family History  Problem Relation Age of Onset   Stroke Mother    Stroke Father    Dementia Maternal Grandfather  ADVANCED DIRECTIVES (Y/N):  N  HEALTH MAINTENANCE: Social History   Tobacco Use   Smoking status: Former   Smokeless tobacco: Never  Advertising account planner   Vaping status: Never Used  Substance Use Topics   Alcohol use: No    Alcohol/week: 0.0 standard drinks of alcohol   Drug use: No     Colonoscopy:  PAP:  Bone density:  Lipid panel:  Allergies  Allergen Reactions   Novocain [Procaine] Palpitations    Current  Outpatient Medications  Medication Sig Dispense Refill   Acetaminophen 500 MG coapsule Take by mouth every 4 (four) hours as needed for fever.     amLODipine (NORVASC) 5 MG tablet Take 1 tablet (5 mg total) by mouth daily. 90 tablet 1   aspirin EC 81 MG tablet Take 81 mg by mouth daily.      atorvastatin (LIPITOR) 20 MG tablet TAKE 1 TABLET BY MOUTH AT BEDTIME 90 tablet 2   Cholecalciferol (VITAMIN D3) 1000 units CAPS Take by mouth daily.      cyanocobalamin (VITAMIN B12) 1000 MCG tablet Take 1,000 mcg by mouth daily.     famotidine (PEPCID) 20 MG tablet Take 1 tablet (20 mg total) by mouth 2 (two) times daily as needed for heartburn or indigestion (for refractory nausea, indigestion or reflux). 60 tablet 1   hydroxyurea (HYDREA) 500 MG capsule TAKE 1 CAPSULE BY MOUTH 4 DAYS A WEEK (Patient taking differently: TAKE 1 CAPSULE BY MOUTH 4 DAYS A WEEK Monday , wednesday, Friday, Saturday.) 60 capsule 5   irbesartan (AVAPRO) 150 MG tablet Take 1 tablet by mouth once daily 90 tablet 0   ketoconazole (NIZORAL) 2 % shampoo Apply 1 Application topically 3 (three) times a week. Wash scalp 3 times weekly, let sit 5 minutes and rinse out 120 mL 11   levothyroxine (SYNTHROID) 88 MCG tablet TAKE 1 TABLET BY MOUTH ONCE DAILY BEFORE BREAKFAST 90 tablet 2   mupirocin ointment (BACTROBAN) 2 % Apply to affected area on back with bandage changes until healed. 22 g 0   nystatin cream (MYCOSTATIN) APPLY 1 APPLICATION TOPICALLY TWICE DAILY 30 g 0   nystatin ointment (MYCOSTATIN) Apply 1 application topically 2 (two) times daily. 30 g 0   pantoprazole (PROTONIX) 40 MG tablet Take 1 tablet (40 mg total) by mouth at bedtime. Do not eat for 2 hours prior to meds/bedtime 90 tablet 1   No current facility-administered medications for this visit.    OBJECTIVE: Vitals:   10/30/22 0950  BP: (!) 119/98  Pulse: 88  Resp: 16  Temp: (!) 96.8 F (36 C)  SpO2: 100%     Body mass index is 27.07 kg/m.    ECOG FS:0 -  Asymptomatic  General: Well-developed, well-nourished, no acute distress. Eyes: Pink conjunctiva, anicteric sclera. HEENT: Normocephalic, moist mucous membranes. Lungs: No audible wheezing or coughing. Heart: Regular rate and rhythm. Abdomen: Soft, nontender, no obvious distention. Musculoskeletal: No edema, cyanosis, or clubbing. Neuro: Alert, answering all questions appropriately. Cranial nerves grossly intact. Skin: No rashes or petechiae noted. Psych: Normal affect.  LAB RESULTS:  Lab Results  Component Value Date   NA 131 (L) 10/30/2022   K 4.5 10/30/2022   CL 99 10/30/2022   CO2 24 10/30/2022   GLUCOSE 104 (H) 10/30/2022   BUN 22 10/30/2022   CREATININE 1.10 (H) 10/30/2022   CALCIUM 9.5 10/30/2022   PROT 7.5 10/30/2022   ALBUMIN 4.4 10/30/2022   AST 19 10/30/2022   ALT 15 10/30/2022  ALKPHOS 86 10/30/2022   BILITOT 1.0 10/30/2022   GFRNONAA 47 (L) 10/30/2022   GFRAA 43 (L) 08/12/2020    Lab Results  Component Value Date   WBC 7.3 10/30/2022   NEUTROABS 5.3 10/30/2022   HGB 13.8 10/30/2022   HCT 41.8 10/30/2022   MCV 96.1 10/30/2022   PLT 555 (H) 10/30/2022     STUDIES: No results found.  ASSESSMENT: Essential thrombocytosis  PLAN:   Essential thrombocytosis: Chronic and unchanged.  Patient's platelet count has trended up slightly to 555, but still approximately around her baseline.  Platelet count has ranged between 390-595 since at least July 2020.  Continue current Hydrea regimen of 4 days a week on Monday, Wednesday, Friday, and Saturday. Continue 81 mg of aspirin.  No further intervention needed.  Return to clinic in 6 months with repeat laboratory work and further evaluation.   Renal insufficiency: Chronic and unchanged.  Patient's creatinine is 1.1 today.   Hyponatremia: Chronic and unchanged.  Patient sodium levels 131 today.     Patient expressed understanding and was in agreement with this plan. She also understands that She can call clinic at  any time with any questions, concerns, or complaints.    [name], MD   10/30/2022 10:38 AM

## 2022-10-30 NOTE — Progress Notes (Signed)
Concerns with having numbness in hands, feet and legs. Also having headaches.

## 2022-11-19 ENCOUNTER — Other Ambulatory Visit: Payer: Self-pay | Admitting: Family Medicine

## 2022-11-19 DIAGNOSIS — E890 Postprocedural hypothyroidism: Secondary | ICD-10-CM

## 2022-12-02 ENCOUNTER — Telehealth: Payer: Self-pay | Admitting: Family Medicine

## 2022-12-02 NOTE — Telephone Encounter (Signed)
Called Gina [name] back to inform her have a lab from 3 years ago unable to get to her but will fax it to the number provided.

## 2022-12-02 NOTE — Telephone Encounter (Signed)
Copied from CRM [id number]. Topic: General - Inquiry >> Dec 02, 2022 11:11 AM Patsy Lager T wrote: Reason for CRM: Justice from [facility] BuTB skin test or QuantiFERON Gold test on file and if so can it be faxed to her at [phone].

## 2022-12-16 ENCOUNTER — Encounter: Payer: Self-pay | Admitting: Family Medicine

## 2022-12-16 ENCOUNTER — Ambulatory Visit: Payer: Medicare HMO | Admitting: Family Medicine

## 2022-12-16 VITALS — BP 130/76 | HR 84 | Temp 97.1°F | Resp 16 | Ht 62.0 in | Wt 150.4 lb

## 2022-12-16 DIAGNOSIS — G620 Drug-induced polyneuropathy: Secondary | ICD-10-CM

## 2022-12-16 DIAGNOSIS — K219 Gastro-esophageal reflux disease without esophagitis: Secondary | ICD-10-CM | POA: Diagnosis not present

## 2022-12-16 DIAGNOSIS — E871 Hypo-osmolality and hyponatremia: Secondary | ICD-10-CM

## 2022-12-16 DIAGNOSIS — E782 Mixed hyperlipidemia: Secondary | ICD-10-CM | POA: Diagnosis not present

## 2022-12-16 DIAGNOSIS — I10 Essential (primary) hypertension: Secondary | ICD-10-CM

## 2022-12-16 DIAGNOSIS — D473 Essential (hemorrhagic) thrombocythemia: Secondary | ICD-10-CM | POA: Diagnosis not present

## 2022-12-16 DIAGNOSIS — N1832 Chronic kidney disease, stage 3b: Secondary | ICD-10-CM

## 2022-12-16 DIAGNOSIS — I6523 Occlusion and stenosis of bilateral carotid arteries: Secondary | ICD-10-CM

## 2022-12-16 DIAGNOSIS — E890 Postprocedural hypothyroidism: Secondary | ICD-10-CM | POA: Diagnosis not present

## 2022-12-16 DIAGNOSIS — E119 Type 2 diabetes mellitus without complications: Secondary | ICD-10-CM | POA: Diagnosis not present

## 2022-12-16 MED ORDER — LEVOTHYROXINE SODIUM 88 MCG PO TABS
ORAL_TABLET | ORAL | 2 refills | Status: DC
Start: 2022-12-16 — End: 2023-06-21

## 2022-12-16 MED ORDER — IRBESARTAN 150 MG PO TABS
150.0000 mg | ORAL_TABLET | Freq: Every day | ORAL | 1 refills | Status: DC
Start: 2022-12-16 — End: 2023-06-21

## 2022-12-16 MED ORDER — AMLODIPINE BESYLATE 5 MG PO TABS
5.0000 mg | ORAL_TABLET | Freq: Every day | ORAL | 1 refills | Status: DC
Start: 2022-12-16 — End: 2023-06-21

## 2022-12-16 NOTE — Assessment & Plan Note (Signed)
Per hem/onc Dr. Orlie Dakin Reviewed last OV from Sept 2024

## 2022-12-16 NOTE — Progress Notes (Signed)
Name: Gina [name]   MRN: [id number]    DOB: 07/17/1926   Date:12/16/2022       Progress Note  Chief Complaint  Patient presents with   Follow-up   Hyperlipidemia   Hypertension   Hypothyroidism     Subjective:   Gina [name] is a 89 y.o. female, presents to clinic for routine f/up on chronic conditions   CKD stage 3 and hyponatremia - recent nephrology OV reviewed: Patient is a 89 y.o. 1-White female of [place] descent with history of long-standing hypertension, hyperlipidemia, atherosclerosis and history of partial thyroidectomy  1. Stage 3a chronic kidney disease (HCC)  2. Hypertensive chronic kidney disease, unspecified, with chronic kidney disease stage I through stage IV, or unspecified  3. Hyponatremia  4. Cyst of kidney  5. Edema of lower extremity   1. Hypertensive chronic kidney disease, stage 3a chronic kidney disease (HCC) Underlying CKD likely secondary to hypertension, atherosclerosis, advanced age 15 recent labs-09/25/2022--creatinine 0.95, GFR 56 Overall progression appears to be slow Continued on aspirin, atorvastatin and irbesartan for cardiovascular risk reduction  2. Renal cyst Large right renal cyst was seen on CT , 8.7 cm. Appears to be benign Not causing any symptoms. Continue conservative care.  3. Hyponatremia Sodium mildly low at 131 from 09/14/2022. Avoid thiazide diuretics. Continue to monitor  4. LE Edema No edema noted today.  5. Hypertension Blood pressure 1 125/62 sitting and 136/82 standing. Continue amlodipine 5 mg daily, irbesartan 150 mg daily No changes made today.  Return in about 6 months (around 04/09/2023).  Gina Pigeon, MD [facility]   Hypertension:  Currently managed on amlodipine 5 mg and irbesartan 150 mg daily Pt reports good med compliance and denies any SE.   Blood pressure today is well controlled. BP Readings from Last 3 Encounters:  12/16/22 130/76  10/30/22 (!) 119/98   10/05/22 127/76   Hypothyroidism: History: decades - hx of partial thyroidectomy  Current Medication Regimen: 88 mcg synthroid Current Symptoms: denies fatigue, weight changes, heat/cold intolerance, bowel/skin changes or CVS symptoms Most recent results are below; we will not be repeating labs today. Lab Results  Component Value Date   TSH 2.478 09/09/2022    Hyperlipidemia: hx of aortic atherosclerosis, on statin Currently treated with lipitor 20 mg, pt reports good med compliance Last Lipids: Lab Results  Component Value Date   CHOL 157 06/24/2022   HDL 59 06/24/2022   LDLCALC 71 06/24/2022   TRIG 200 (H) 06/24/2022   CHOLHDL 2.7 06/24/2022   - Denies: Chest pain, shortness of breath, myalgias, claudication  Intermittent GERD/GI sx - has PPI - a few months ago started taking daily and pepcid prn  She preferred to take PPI in the evening, GI sx currently well controlled.  She stopped PPI a few days ago, she wants to avoid long term use if possible, working on avoiding food triggers  Neuropathy - numbness - slightly progressed from her baseline  New T2DM A1C increased to 6.5 this May, not on meds Denies: Polyuria, polydipsia, vision changes, neuropathy, hypoglycemia Recent pertinent labs: Lab Results  Component Value Date   HGBA1C 6.5 (H) 06/24/2022   HGBA1C 6.3 (H) 12/22/2021   HGBA1C 6.2 (H) 06/20/2021   Lab Results  Component Value Date   LDLCALC 71 06/24/2022   CREATININE 1.10 (H) 10/30/2022   Standard of care and health maintenance: Urine Microalbumin:  due Foot exam:  done today DM eye exam:  due ACEI/ARB:  yes Statin:  yes  Current Outpatient Medications:    Acetaminophen 500 MG coapsule, Take by mouth every 4 (four) hours as needed for fever., Disp: , Rfl:    amLODipine (NORVASC) 5 MG tablet, Take 1 tablet (5 mg total) by mouth daily., Disp: 90 tablet, Rfl: 1   aspirin EC 81 MG tablet, Take 81 mg by mouth daily. , Disp: , Rfl:    atorvastatin  (LIPITOR) 20 MG tablet, TAKE 1 TABLET BY MOUTH AT BEDTIME, Disp: 90 tablet, Rfl: 2   Cholecalciferol (VITAMIN D3) 1000 units CAPS, Take by mouth daily. , Disp: , Rfl:    cyanocobalamin (VITAMIN B12) 1000 MCG tablet, Take 1,000 mcg by mouth daily., Disp: , Rfl:    famotidine (PEPCID) 20 MG tablet, Take 1 tablet (20 mg total) by mouth 2 (two) times daily as needed for heartburn or indigestion (for refractory nausea, indigestion or reflux)., Disp: 60 tablet, Rfl: 1   hydroxyurea (HYDREA) 500 MG capsule, TAKE 1 CAPSULE BY MOUTH 4 DAYS A WEEK (Patient taking differently: TAKE 1 CAPSULE BY MOUTH 4 DAYS A WEEK Monday , wednesday, Friday, Saturday.), Disp: 60 capsule, Rfl: 5   irbesartan (AVAPRO) 150 MG tablet, Take 1 tablet by mouth once daily, Disp: 90 tablet, Rfl: 0   ketoconazole (NIZORAL) 2 % shampoo, Apply 1 Application topically 3 (three) times a week. Wash scalp 3 times weekly, let sit 5 minutes and rinse out, Disp: 120 mL, Rfl: 11   levothyroxine (SYNTHROID) 88 MCG tablet, TAKE 1 TABLET BY MOUTH ONCE DAILY BEFORE BREAKFAST, Disp: 90 tablet, Rfl: 0   mupirocin ointment (BACTROBAN) 2 %, Apply to affected area on back with bandage changes until healed., Disp: 22 g, Rfl: 0   nystatin cream (MYCOSTATIN), APPLY 1 APPLICATION TOPICALLY TWICE DAILY, Disp: 30 g, Rfl: 0   nystatin ointment (MYCOSTATIN), Apply 1 application topically 2 (two) times daily., Disp: 30 g, Rfl: 0   pantoprazole (PROTONIX) 40 MG tablet, Take 1 tablet (40 mg total) by mouth at bedtime. Do not eat for 2 hours prior to meds/bedtime, Disp: 90 tablet, Rfl: 1  Patient Active Problem List   Diagnosis Date Noted   Hyponatremia 09/09/2022   UTI (urinary tract infection) 09/09/2022   Prediabetes 06/20/2021   Paresthesia of upper and lower extremities of both sides 03/20/2020   Requires assistance with activities of daily living (ADL) 03/20/2020   Mixed hyperlipidemia 03/19/2020   Anxiety with depression 03/19/2020   Degenerative disc  disease, cervical 05/10/2019   De Quervain's tenosynovitis, left 05/10/2019   Bilateral hand numbness 05/10/2019   DNR (do not resuscitate) 04/22/2018   Venous stasis 04/21/2018   Cystocele with prolapse 04/01/2017   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   Essential thrombocytosis (HCC) 07/23/2015   Mild atherosclerosis of carotid artery 06/25/2015   Dyslipidemia 06/03/2015   Diverticulosis 05/28/2015   GERD without esophagitis 02/06/2015   Neuropathy 01/31/2015   Cervical radiculopathy 01/31/2015   Stage 3b chronic kidney disease (HCC) 10/06/2014   Renal cyst 10/06/2014   Uterine prolapse 10/06/2014   Cervical radiculopathy due to degenerative joint disease of spine 10/06/2014   History of right hip replacement 10/06/2014   Hypothyroidism 10/05/2014   Essential hypertension 09/13/2014    Past Surgical History:  Procedure Laterality Date   ADENOIDECTOMY     APPENDECTOMY     biopsy, right breast      CATARACT EXTRACTION W/PHACO Left 03/25/2015   Procedure: CATARACT EXTRACTION PHACO AND INTRAOCULAR LENS PLACEMENT (IOC);  Surgeon: [name], MD;  Location: [facility] ORS;  Service: Ophthalmology;  Laterality: Left;  [place] 02:09 AP% 27.3 CDE 59.97 fluid pack lot #[id number]   CESAREAN SECTION     COLON SURGERY     JOINT REPLACEMENT Right    total hip   THYROID LOBECTOMY     THYROIDECTOMY     partial   TONSILLECTOMY     TOTAL HIP ARTHROPLASTY     UPPER GI ENDOSCOPY  10/17/2008    Family History  Problem Relation Age of Onset   Stroke Mother    Stroke Father    Dementia Maternal Grandfather     Social History   Tobacco Use   Smoking status: Former   Smokeless tobacco: Never  Vaping Use   Vaping status: Never Used  Substance Use Topics   Alcohol use: No    Alcohol/week: 0.0 standard drinks of alcohol   Drug use: No     Allergies  Allergen Reactions   Novocain [Procaine] Palpitations    Health Maintenance  Topic Date Due   Medicare Annual Wellness (AWV)  01/05/2018    Zoster Vaccines- Shingrix (1 of 2) 03/18/2023 (Originally 06/23/1945)   COVID-19 Vaccine (5 - 2023-24 season) 01/20/2023   Pneumonia Vaccine 8+ Years old  Completed   INFLUENZA VACCINE  Completed   HPV VACCINES  Aged Out   DTaP/Tdap/Td  Discontinued   MAMMOGRAM  Discontinued   DEXA SCAN  Discontinued    Chart Review Today: I personally reviewed active problem list, medication list, allergies, family history, social history, health maintenance, notes from last encounter, lab results, imaging with the patient/caregiver today.   Review of Systems  Constitutional: Negative.   HENT: Negative.    Eyes: Negative.   Respiratory: Negative.    Cardiovascular: Negative.   Gastrointestinal: Negative.   Endocrine: Negative.   Genitourinary: Negative.   Musculoskeletal: Negative.   Skin: Negative.   Allergic/Immunologic: Negative.   Neurological: Negative.   Hematological: Negative.   Psychiatric/Behavioral: Negative.    All other systems reviewed and are negative.    Objective:   Vitals:   12/16/22 0958  Pulse: 84  Resp: 16  Temp: (!) 97.1 F (36.2 C)  TempSrc: Temporal  SpO2: 97%  Weight: 150 lb 6.4 oz (68.2 kg)  Height: 5\' 2"  (1.575 m)    Body mass index is 27.51 kg/m.  Physical Exam Vitals and nursing note reviewed.  Constitutional:      General: She is not in acute distress.    Appearance: Normal appearance. She is well-developed. She is not ill-appearing, toxic-appearing or diaphoretic.  HENT:     Head: Normocephalic and atraumatic.     Right Ear: External ear normal.     Left Ear: External ear normal.     Nose: Nose normal.  Eyes:     General:        Right eye: No discharge.        Left eye: No discharge.     Conjunctiva/sclera: Conjunctivae normal.  Neck:     Trachea: No tracheal deviation.  Cardiovascular:     Rate and Rhythm: Normal rate and regular rhythm.     Pulses: Normal pulses.     Heart sounds: Normal heart sounds.  Pulmonary:     Effort:  Pulmonary effort is normal. No respiratory distress.     Breath sounds: Normal breath sounds. No stridor. No wheezing, rhonchi or rales.  Musculoskeletal:     Right lower leg: No edema.     Left lower leg: No edema.  Skin:    General: Skin is  warm and dry.     Findings: No rash.  Neurological:     Mental Status: She is alert. Mental status is at baseline.     Motor: No abnormal muscle tone.     Coordination: Coordination normal.  Psychiatric:        Mood and Affect: Mood normal.        Behavior: Behavior normal.    Diabetic Foot Exam - Simple   Simple Foot Form Diabetic Foot exam was performed with the following findings: Yes 12/16/2022 10:37 AM  Visual Inspection No deformities, no ulcerations, no other skin breakdown bilaterally: Yes Sensation Testing Intact to touch and monofilament testing bilaterally: Yes Pulse Check Posterior Tibialis and Dorsalis pulse intact bilaterally: Yes Comments         Assessment & Plan:   Problem List Items Addressed This Visit       Cardiovascular and Mediastinum   Essential hypertension - Primary (Chronic)    BP well controlled and at goal today, chronic, stable On amlodipine 5mg  and irbesartan 150 mg BP Readings from Last 3 Encounters:  12/16/22 130/76  10/30/22 (!) 119/98  10/05/22 127/76         Relevant Medications   irbesartan (AVAPRO) 150 MG tablet   amLODipine (NORVASC) 5 MG tablet   Other Relevant Orders   COMPLETE METABOLIC PANEL WITH GFR   Mild atherosclerosis of carotid artery (Chronic)    On statin, monitoring      Relevant Medications   irbesartan (AVAPRO) 150 MG tablet   amLODipine (NORVASC) 5 MG tablet   Other Relevant Orders   Lipid Profile   COMPLETE METABOLIC PANEL WITH GFR     Digestive   GERD without esophagitis    Sx currently well controlled She is taking a break from PPI, encouraged her to use lowest needed dose, take breaks, can use pepcid for sx and may need 1-2 weeks of PPI here and  there. Did discuss H. Pylori breath test if her sx return and she has been off PPI for several weeks, offered to order it for her (after she asked for it) but then she declined      Relevant Orders   H. pylori breath test     Endocrine   Hypothyroidism    Euthyroid Lab Results  Component Value Date   TSH 2.478 09/09/2022  On 88 mcg levothyroxine, taking daily and correctly Will recheck labs at next routine f/up visit       Relevant Medications   levothyroxine (SYNTHROID) 88 MCG tablet   Other Relevant Orders   TSH   Type 2 diabetes mellitus without complication, without long-term current use of insulin (HCC)    A1C 6.5 Jun 24 2022, prediabetes prior to that, not on medications She declined to repeat A1C today DM foot exam done, explained she would need DM eye exam She is currently seeing nephrology - will review their records for possible UACR labs done, will need to get that next ov if not done by nephrology Pt preferred to do labs at next OV She is on statin and ARB and works on healthy diet      Relevant Medications   irbesartan (AVAPRO) 150 MG tablet   Other Relevant Orders   Hemoglobin A1c   Microalbumin / creatinine urine ratio   HgB A1c   COMPLETE METABOLIC PANEL WITH GFR     Nervous and Auditory   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 Reviewed last OV note/A&P, labs Fairly stable eGFR BP still at goal Will try to keep A1C also at goal and avoid nephrotoxic agents      Relevant Medications   irbesartan (AVAPRO) 150 MG tablet   amLODipine (NORVASC) 5 MG tablet   Other Relevant Orders   COMPLETE METABOLIC PANEL WITH GFR     Hematopoietic and Hemostatic   Essential thrombocytosis (HCC) (Chronic)    Per hem/onc Dr. Orlie Dakin Reviewed last OV from Sept 2024        Other   Mixed hyperlipidemia     Has been stable and well controlled with atorvastatin 20 mg, good compliance, no SE or concerns Labs done earlier this year, will continue to check lipid panel annually - due next routine OV Lab Results  Component Value Date   CHOL 157 06/24/2022   HDL 59 06/24/2022   LDLCALC 71 06/24/2022   TRIG 200 (H) 06/24/2022   CHOLHDL 2.7 06/24/2022         Relevant Medications   irbesartan (AVAPRO) 150 MG tablet   amLODipine (NORVASC) 5 MG tablet   Other Relevant Orders   Lipid Profile   Hyponatremia    Mild hyponatremia, last sodium 131 Seeing nephrology Avoid thiazide diuretics      Relevant Orders   COMPLETE METABOLIC PANEL WITH GFR     Return for 6 month routine f/up and needs AWV.   [name], PA-C 12/16/22 10:03 AM

## 2022-12-16 NOTE — Assessment & Plan Note (Signed)
A1C 6.5 Jun 24 2022, prediabetes prior to that, not on medications She declined to repeat A1C today DM foot exam done, explained she would need DM eye exam She is currently seeing nephrology - will review their records for possible UACR labs done, will need to get that next ov if not done by nephrology Pt preferred to do labs at next OV She is on statin and ARB and works on [employer]

## 2022-12-16 NOTE — Assessment & Plan Note (Signed)
Euthyroid Lab Results  Component Value Date   TSH 2.478 09/09/2022  On 88 mcg levothyroxine, taking daily and correctly Will recheck labs at next routine f/up visit

## 2022-12-16 NOTE — Assessment & Plan Note (Signed)
Continued neuropathy/numbness complaints

## 2022-12-16 NOTE — Assessment & Plan Note (Signed)
Sx currently well controlled She is taking a break from PPI, encouraged her to use lowest needed dose, take breaks, can use pepcid for sx and may need 1-2 weeks of PPI here and there. Did discuss H. Pylori breath test if her sx return and she has been off PPI for several weeks, offered to order it for her (after she asked for it) but then she declined

## 2022-12-16 NOTE — Assessment & Plan Note (Signed)
Has been stable and well controlled with atorvastatin 20 mg, good compliance, no SE or concerns Labs done earlier this year, will continue to check lipid panel annually - due next routine OV Lab Results  Component Value Date   CHOL 157 06/24/2022   HDL 59 06/24/2022   LDLCALC 71 06/24/2022   TRIG 200 (H) 06/24/2022   CHOLHDL 2.7 06/24/2022

## 2022-12-16 NOTE — Assessment & Plan Note (Signed)
BP well controlled and at goal today, chronic, stable On amlodipine 5mg  and irbesartan 150 mg BP Readings from Last 3 Encounters:  12/16/22 130/76  10/30/22 (!) 119/98  10/05/22 127/76

## 2022-12-16 NOTE — Assessment & Plan Note (Signed)
Seeing Dr[facility] The[facility]ins at [facility] Reviewed last OV note/A&P, labs Fairly stable eGFR BP still at goal Will try to keep A1C also at goal and avoid nephrotoxic agents

## 2022-12-16 NOTE — Assessment & Plan Note (Signed)
Mild hyponatremia, last sodium 131 Seeing nephrology Avoid thiazide diuretics

## 2022-12-16 NOTE — Assessment & Plan Note (Signed)
On statin, monitoring 

## 2022-12-25 ENCOUNTER — Ambulatory Visit: Payer: Medicare HMO | Admitting: Family Medicine

## 2023-01-01 ENCOUNTER — Ambulatory Visit: Payer: Medicare HMO | Admitting: Family Medicine

## 2023-01-04 ENCOUNTER — Encounter: Payer: Self-pay | Admitting: Obstetrics & Gynecology

## 2023-01-04 ENCOUNTER — Ambulatory Visit: Payer: Medicare HMO | Admitting: Obstetrics & Gynecology

## 2023-01-04 VITALS — BP 131/64 | HR 77 | Resp 16 | Ht 63.5 in | Wt 149.4 lb

## 2023-01-04 DIAGNOSIS — Z4689 Encounter for fitting and adjustment of other specified devices: Secondary | ICD-10-CM | POA: Diagnosis not present

## 2023-01-04 DIAGNOSIS — N814 Uterovaginal prolapse, unspecified: Secondary | ICD-10-CM | POA: Diagnosis not present

## 2023-01-04 NOTE — Patient Instructions (Signed)
How to Use a Vaginal Pessary  A vaginal pessary is a removable device that is placed into your vagina to support pelvic organs that droop. These organs include your uterus, bladder, and rectum. When your pelvic organs drop down into your vagina, it causes a condition called pelvic organ prolapse (POP). A pessary may be an alternative to surgery for women with POP. It may help women who leak urine when they strain or exercise (stress incontinence). This is a symptom of POP. A vaginal pessary may also be a temporary treatment for stress incontinence during pregnancy. There are several types of pessaries. All types are usually made of silicone. You can insert and remove some on your own. Other types must be inserted and removed by your health care provider at office visits. The reason you are using a pessary and the severity of your condition will determine which one is best for you. It is also important to find the right size. A pessary that is too small may fall out. A pessary that is too large may cause pain or discomfort. Your health care provider will do a physical exam to find the correct size and fit for your pessary. It may take several appointments to find the best fit for you. If you can be fit with the type of pessary that you can insert, remove, and clean yourself, your health care provider will teach you how to use your pessary at home. You may have checkups every few months. If you have the type of pessary that needs to be inserted and removed by your health care provider, you will have appointments every few months to have the pessary removed, cleaned, and replaced. What are the risks? When properly fitted and cared for, risks of using a vaginal pessary can be small. However, there can be problems that may include: Vaginal discharge. Vaginal bleeding. A bad smell coming from your vagina. Scraping of the skin inside your vagina. How to use your pessary Follow your health care provider's  instructions for using a pessary. These instructions may vary, depending on the type of pessary you have. To insert a pessary: Wash your hands with soap and water for at least 20 seconds. Squeeze or fold the pessary in half and lubricate the tip with a water-based lubricant. Insert the pessary into your vagina. It will unfold and provide support. To remove the pessary, gently tug it out of your vagina. You can remove the pessary every night or after several days. You can also remove it to have sex. How to care for your pessary If you have a pessary that you can remove: Clean your pessary with soap and water. Rinse well. Dry it completely before inserting it back into your vagina. Follow these instructions at home: Take over-the-counter and prescription medicines only as told by your health care provider. Your health care provider may prescribe an estrogen cream to moisten your vagina. Keep all follow-up visits. This is important. Contact a health care provider if: You feel any pain or discomfort when your pessary is in place. You continue to have stress incontinence. You have trouble keeping your pessary from falling out. You have an unusual vaginal discharge that is blood-tinged or smells bad. Summary A vaginal pessary is a removable device that is placed into your vagina to support pelvic organs that droop. This condition is called pelvic organ prolapse (POP). There are several types of pessaries. Some you can insert and remove on your own. Others must be inserted and  removed by your health care provider. The best type for you depends on the reason you are using a pessary and the severity of your condition. It is also important to find the right size. If you can use the type that you insert and remove on your own, your health care provider will teach you how to use it and schedule checkups every few months. If you have the type that needs to be inserted and removed by your health care  provider, you will have regular appointments to have your pessary removed, cleaned, and replaced. This information is not intended to replace advice given to you by your health care provider. Make sure you discuss any questions you have with your health care provider. Document Revised: 08/10/2019 Document Reviewed: 08/10/2019 Elsevier Patient Education  2024 [employer].

## 2023-01-04 NOTE — Progress Notes (Signed)
    GYNECOLOGY PROGRESS NOTE  Subjective:    Patient ID: Gina [name], female    DOB: 10/18/1926, 89 y.o.   MRN: [id number]  HPI  Patient is a 89 y.o. [id number] female who presents for Pessary Maintenance. She reports no difficulties with her pessary.   The following portions of the patient's history were reviewed and updated as appropriate: allergies, current medications, past family history, past medical history, past social history, past surgical history, and problem list.  Review of Systems Pertinent items are noted in HPI.   Objective:   There were no vitals taken for this visit. There is no height or weight on file to calculate BMI. Well nourished, well hydrated White female, no apparent distress She is conversing normally. She uses a walker to aid in ambulation. I removed her ring with support pessary and cleaned it. I inspected her vagina with a speculum and noted some excoriations.    Assessment:   1. Pessary maintenance   2. Cystocele with prolapse      Plan:  Excoriation with pessary She will leave it out for a week and come back for a re check    Nicholaus Bloom, MD  [facility]

## 2023-01-11 ENCOUNTER — Ambulatory Visit: Payer: Medicare HMO | Admitting: Obstetrics & Gynecology

## 2023-01-11 ENCOUNTER — Encounter: Payer: Self-pay | Admitting: Obstetrics & Gynecology

## 2023-01-11 VITALS — BP 131/73 | HR 82 | Wt 145.1 lb

## 2023-01-11 DIAGNOSIS — N814 Uterovaginal prolapse, unspecified: Secondary | ICD-10-CM

## 2023-01-11 DIAGNOSIS — S30814A Abrasion of vagina and vulva, initial encounter: Secondary | ICD-10-CM | POA: Diagnosis not present

## 2023-01-11 NOTE — Progress Notes (Signed)
    GYNECOLOGY PROGRESS NOTE  Subjective:    Patient ID: Gina [name], female    DOB: 06-05-1926, 89 y.o.   MRN: [id number]  HPI  Patient is a 89 y.o. [id number]  here for vaginal re check after vaginal excoriations were noted last week at her q3 month pessary maintenance visit.  The following portions of the patient's history were reviewed and updated as appropriate: allergies, current medications, past family history, past medical history, past social history, past surgical history, and problem list.  Review of Systems Pertinent items are noted in HPI.   Objective:   There were no vitals taken for this visit. There is no height or weight on file to calculate BMI. Well nourished, well hydrated White female, no apparent distress She is conversing normally. She uses a walker to aid with walking. I inspected her vagina and noted that the excoriaton noted last week is nearly healed.  She suggested that a smaller pessary might work so I tried a #2 ring with support (She currently has a #3 ring with support).  The #2 worked and did not fall out.  Assessment:   1. Abrasion of vagina, initial encounter   2. Cystocele with prolapse      Plan:   1. Abrasion of vagina, initial encounter   2. Cystocele with prolapse - I have ordered a #2 ring with support. She now has a #3 in place. She will get the #2 when she comes back in 3 months for pessary maintenance.

## 2023-01-13 ENCOUNTER — Ambulatory Visit: Payer: Medicare HMO | Admitting: Internal Medicine

## 2023-03-23 ENCOUNTER — Other Ambulatory Visit: Payer: Self-pay | Admitting: Family Medicine

## 2023-03-23 DIAGNOSIS — K219 Gastro-esophageal reflux disease without esophagitis: Secondary | ICD-10-CM

## 2023-03-23 DIAGNOSIS — R110 Nausea: Secondary | ICD-10-CM

## 2023-04-05 ENCOUNTER — Encounter: Payer: Self-pay | Admitting: Obstetrics

## 2023-04-05 ENCOUNTER — Ambulatory Visit: Payer: Medicare Other | Admitting: Obstetrics

## 2023-04-05 VITALS — BP 120/60 | HR 78 | Ht 64.0 in | Wt 146.0 lb

## 2023-04-05 DIAGNOSIS — N814 Uterovaginal prolapse, unspecified: Secondary | ICD-10-CM

## 2023-04-05 DIAGNOSIS — Z4689 Encounter for fitting and adjustment of other specified devices: Secondary | ICD-10-CM

## 2023-04-05 NOTE — Progress Notes (Signed)
    GYNECOLOGY PROGRESS NOTE  Subjective:  PCP: Adeline Hone, PA-C  Patient ID: Gina [name], female    DOB: Feb 16, 1927, 89 y.o.   MRN: [id number]  HPI [name] is a 89 y.o. [id number] who presents today for her pessary follow up and examination related to her pelvic floor weakening.  Pt reports tolerating the pessary well with no vaginal bleeding and no vaginal discharge.  Symptoms of pelvic floor weakening have greatly improved. She is voiding and defecating without difficulty. She currently has a #3 ring pessary, noted to have excoriations at her last visit 01/11/23 with Dr. Everardo Hitch.   The following portions of the patient's history were reviewed and updated as appropriate: allergies, current medications, past family history, past medical history, past social history, past surgical history, and problem list.  Review of Systems Pertinent items are noted in HPI.   Objective:   Blood pressure 120/60, pulse 78, height 5\' 4"  (1.626 m), weight 146 lb (66.2 kg). Body mass index is 25.06 kg/m. Physical Exam Constitutional:      Appearance: Normal appearance. She is normal weight.  Genitourinary:     Vulva normal.  HENT:     Head: Normocephalic and atraumatic.  Eyes:     Extraocular Movements: Extraocular movements intact.  Pulmonary:     Effort: Pulmonary effort is normal.  Musculoskeletal:        General: Normal range of motion.  Neurological:     General: No focal deficit present.     Mental Status: She is alert.  Psychiatric:        Mood and Affect: Mood normal.  Vitals and nursing note reviewed. Exam conducted with a chaperone present.   Pessary Care Pessary removed and cleaned.  Vagina checked - with erosions - pessary cleaned and given to patient.   Assessment/Plan:   1. Pessary maintenance   Pessary holiday advised.  #3 ring given back to patient. Will leave out and follow symptoms closely.  Replace with the #2 that we ordered in for her in 2 weeks if  healed.   Sofia Dunn, DO [facility]

## 2023-04-05 NOTE — Patient Instructions (Signed)
 How to Use a Vaginal Pessary  A vaginal pessary is a removable device that is placed into your vagina to support pelvic organs that droop. These organs include your uterus, bladder, and rectum. When your pelvic organs drop down into your vagina, it causes a condition called pelvic organ prolapse (POP). A pessary may be an alternative to surgery for women with POP. It may help women who leak urine when they strain or exercise (stress incontinence). This is a symptom of POP. A vaginal pessary may also be a temporary treatment for stress incontinence during pregnancy. There are several types of pessaries. All types are usually made of silicone. You can insert and remove some on your own. Other types must be inserted and removed by your health care provider at office visits. The reason you are using a pessary and the severity of your condition will determine which one is best for you. It is also important to find the right size. A pessary that is too small may fall out. A pessary that is too large may cause pain or discomfort. Your health care provider will do a physical exam to find the correct size and fit for your pessary. It may take several appointments to find the best fit for you. If you can be fit with the type of pessary that you can insert, remove, and clean yourself, your health care provider will teach you how to use your pessary at home. You may have checkups every few months. If you have the type of pessary that needs to be inserted and removed by your health care provider, you will have appointments every few months to have the pessary removed, cleaned, and replaced. What are the risks? When properly fitted and cared for, risks of using a vaginal pessary can be small. However, there can be problems that may include: Vaginal discharge. Vaginal bleeding. A bad smell coming from your vagina. Scraping of the skin inside your vagina. How to use your pessary Follow your health care provider's  instructions for using a pessary. These instructions may vary, depending on the type of pessary you have. To insert a pessary: Wash your hands with soap and water for at least 20 seconds. Squeeze or fold the pessary in half and lubricate the tip with a water-based lubricant. Insert the pessary into your vagina. It will unfold and provide support. To remove the pessary, gently tug it out of your vagina. You can remove the pessary every night or after several days. You can also remove it to have sex. How to care for your pessary If you have a pessary that you can remove: Clean your pessary with soap and water. Rinse well. Dry it completely before inserting it back into your vagina. Follow these instructions at home: Take over-the-counter and prescription medicines only as told by your health care provider. Your health care provider may prescribe an estrogen cream to moisten your vagina. Keep all follow-up visits. This is important. Contact a health care provider if: You feel any pain or discomfort when your pessary is in place. You continue to have stress incontinence. You have trouble keeping your pessary from falling out. You have an unusual vaginal discharge that is blood-tinged or smells bad. Summary A vaginal pessary is a removable device that is placed into your vagina to support pelvic organs that droop. This condition is called pelvic organ prolapse (POP). There are several types of pessaries. Some you can insert and remove on your own. Others must be inserted and  removed by your health care provider. The best type for you depends on the reason you are using a pessary and the severity of your condition. It is also important to find the right size. If you can use the type that you insert and remove on your own, your health care provider will teach you how to use it and schedule checkups every few months. If you have the type that needs to be inserted and removed by your health care  provider, you will have regular appointments to have your pessary removed, cleaned, and replaced. This information is not intended to replace advice given to you by your health care provider. Make sure you discuss any questions you have with your health care provider. Document Revised: 08/10/2019 Document Reviewed: 08/10/2019 Elsevier Patient Education  2024 [employer].

## 2023-04-16 NOTE — Patient Instructions (Signed)
 How to Use a Vaginal Pessary  A vaginal pessary is a removable device that is placed into your vagina to support pelvic organs that droop. These organs include your uterus, bladder, and rectum. When your pelvic organs drop down into your vagina, it causes a condition called pelvic organ prolapse (POP). A pessary may be an alternative to surgery for women with POP. It may help women who leak urine when they strain or exercise (stress incontinence). This is a symptom of POP. A vaginal pessary may also be a temporary treatment for stress incontinence during pregnancy. There are several types of pessaries. All types are usually made of silicone. You can insert and remove some on your own. Other types must be inserted and removed by your health care provider at office visits. The reason you are using a pessary and the severity of your condition will determine which one is best for you. It is also important to find the right size. A pessary that is too small may fall out. A pessary that is too large may cause pain or discomfort. Your health care provider will do a physical exam to find the correct size and fit for your pessary. It may take several appointments to find the best fit for you. If you can be fit with the type of pessary that you can insert, remove, and clean yourself, your health care provider will teach you how to use your pessary at home. You may have checkups every few months. If you have the type of pessary that needs to be inserted and removed by your health care provider, you will have appointments every few months to have the pessary removed, cleaned, and replaced. What are the risks? When properly fitted and cared for, risks of using a vaginal pessary can be small. However, there can be problems that may include: Vaginal discharge. Vaginal bleeding. A bad smell coming from your vagina. Scraping of the skin inside your vagina. How to use your pessary Follow your health care provider's  instructions for using a pessary. These instructions may vary, depending on the type of pessary you have. To insert a pessary: Wash your hands with soap and water for at least 20 seconds. Squeeze or fold the pessary in half and lubricate the tip with a water-based lubricant. Insert the pessary into your vagina. It will unfold and provide support. To remove the pessary, gently tug it out of your vagina. You can remove the pessary every night or after several days. You can also remove it to have sex. How to care for your pessary If you have a pessary that you can remove: Clean your pessary with soap and water. Rinse well. Dry it completely before inserting it back into your vagina. Follow these instructions at home: Take over-the-counter and prescription medicines only as told by your health care provider. Your health care provider may prescribe an estrogen cream to moisten your vagina. Keep all follow-up visits. This is important. Contact a health care provider if: You feel any pain or discomfort when your pessary is in place. You continue to have stress incontinence. You have trouble keeping your pessary from falling out. You have an unusual vaginal discharge that is blood-tinged or smells bad. Summary A vaginal pessary is a removable device that is placed into your vagina to support pelvic organs that droop. This condition is called pelvic organ prolapse (POP). There are several types of pessaries. Some you can insert and remove on your own. Others must be inserted and  removed by your health care provider. The best type for you depends on the reason you are using a pessary and the severity of your condition. It is also important to find the right size. If you can use the type that you insert and remove on your own, your health care provider will teach you how to use it and schedule checkups every few months. If you have the type that needs to be inserted and removed by your health care  provider, you will have regular appointments to have your pessary removed, cleaned, and replaced. This information is not intended to replace advice given to you by your health care provider. Make sure you discuss any questions you have with your health care provider. Document Revised: 08/10/2019 Document Reviewed: 08/10/2019 Elsevier Patient Education  2024 [employer].

## 2023-04-16 NOTE — Progress Notes (Signed)
    GYNECOLOGY PROGRESS NOTE  Subjective:  PCP: [name], PA-C  Patient ID: Gina [name], female    DOB: [birth year], 89 y.o.   MRN: [id number]  HPI Ms. Shannette Tabares is a 89 y.o. [id number] who presents today for her pessary follow up and examination related to her pelvic floor weakening.  Pt reports tolerating the pessary well with no vaginal bleeding and no vaginal discharge.  Symptoms of pelvic floor weakening have greatly improved. She is voiding and defecating without difficulty. She has been on pessary holiday for the past 2 wks, noted to have excoriations at her last visit 01/11/23 with Dr. Marice Potter and then still present on 04/05/23 with myself. She doesn't feel much different with it out and wondering if she really needs to wear it or not.   The following portions of the patient's history were reviewed and updated as appropriate: allergies, current medications, past family history, past medical history, past social history, past surgical history, and problem list.  Review of Systems Pertinent items are noted in HPI.   Objective:   Blood pressure (!) 141/62, pulse 85, height 5\' 3"  (1.6 m), weight 145 lb (65.8 kg). Body mass index is 25.69 kg/m. Physical Exam Constitutional:      Appearance: Normal appearance. She is normal weight.  Genitourinary:     Vulva normal.     No lesions in the vagina.     Vaginal ulceration present.     No vaginal discharge, tenderness or bleeding.  HENT:     Head: Normocephalic and atraumatic.  Eyes:     Extraocular Movements: Extraocular movements intact.  Pulmonary:     Effort: Pulmonary effort is normal.  Musculoskeletal:        General: Normal range of motion.  Neurological:     General: No focal deficit present.     Mental Status: She is alert.  Psychiatric:        Mood and Affect: Mood normal.  Vitals and nursing note reviewed. Exam conducted with a chaperone present.   Pessary Care New #2 ring provided to patient today in box.     Assessment/Plan:   1. Pessary maintenance   Continued pessary holiday advised.  New #2 ring in box given to patient. Do not insert. RTC in 1-2 weeks and if fully healed, will insert #2, pt understands to bring with her.     Julieanne Manson, DO [facility]

## 2023-04-19 ENCOUNTER — Ambulatory Visit: Payer: Medicare Other | Admitting: Obstetrics

## 2023-04-19 ENCOUNTER — Encounter: Payer: Self-pay | Admitting: Obstetrics

## 2023-04-19 VITALS — BP 141/62 | HR 85 | Ht 63.0 in | Wt 145.0 lb

## 2023-04-19 DIAGNOSIS — I129 Hypertensive chronic kidney disease with stage 1 through stage 4 chronic kidney disease, or unspecified chronic kidney disease: Secondary | ICD-10-CM | POA: Diagnosis not present

## 2023-04-19 DIAGNOSIS — Z4689 Encounter for fitting and adjustment of other specified devices: Secondary | ICD-10-CM

## 2023-04-19 DIAGNOSIS — N1831 Chronic kidney disease, stage 3a: Secondary | ICD-10-CM | POA: Diagnosis not present

## 2023-04-19 DIAGNOSIS — N281 Cyst of kidney, acquired: Secondary | ICD-10-CM | POA: Diagnosis not present

## 2023-04-19 DIAGNOSIS — E871 Hypo-osmolality and hyponatremia: Secondary | ICD-10-CM | POA: Diagnosis not present

## 2023-04-22 DIAGNOSIS — E871 Hypo-osmolality and hyponatremia: Secondary | ICD-10-CM | POA: Diagnosis not present

## 2023-04-22 DIAGNOSIS — I129 Hypertensive chronic kidney disease with stage 1 through stage 4 chronic kidney disease, or unspecified chronic kidney disease: Secondary | ICD-10-CM | POA: Diagnosis not present

## 2023-04-22 DIAGNOSIS — N1831 Chronic kidney disease, stage 3a: Secondary | ICD-10-CM | POA: Diagnosis not present

## 2023-04-27 ENCOUNTER — Other Ambulatory Visit: Payer: Self-pay | Admitting: *Deleted

## 2023-04-27 DIAGNOSIS — D473 Essential (hemorrhagic) thrombocythemia: Secondary | ICD-10-CM

## 2023-04-28 ENCOUNTER — Encounter: Payer: Self-pay | Admitting: Oncology

## 2023-04-28 ENCOUNTER — Inpatient Hospital Stay: Payer: Medicare HMO | Attending: Oncology

## 2023-04-28 ENCOUNTER — Inpatient Hospital Stay: Payer: Medicare HMO | Admitting: Oncology

## 2023-04-28 VITALS — BP 131/66 | HR 85 | Temp 96.7°F | Resp 16 | Ht 63.0 in | Wt 147.0 lb

## 2023-04-28 DIAGNOSIS — Z79899 Other long term (current) drug therapy: Secondary | ICD-10-CM | POA: Diagnosis not present

## 2023-04-28 DIAGNOSIS — E871 Hypo-osmolality and hyponatremia: Secondary | ICD-10-CM | POA: Diagnosis not present

## 2023-04-28 DIAGNOSIS — D473 Essential (hemorrhagic) thrombocythemia: Secondary | ICD-10-CM | POA: Diagnosis not present

## 2023-04-28 DIAGNOSIS — N289 Disorder of kidney and ureter, unspecified: Secondary | ICD-10-CM | POA: Insufficient documentation

## 2023-04-28 LAB — CMP (CANCER CENTER ONLY)
ALT: 16 U/L (ref 0–44)
AST: 19 U/L (ref 15–41)
Albumin: 4.2 g/dL (ref 3.5–5.0)
Alkaline Phosphatase: 86 U/L (ref 38–126)
Anion gap: 8 (ref 5–15)
BUN: 31 mg/dL — ABNORMAL HIGH (ref 8–23)
CO2: 24 mmol/L (ref 22–32)
Calcium: 9 mg/dL (ref 8.9–10.3)
Chloride: 100 mmol/L (ref 98–111)
Creatinine: 1.09 mg/dL — ABNORMAL HIGH (ref 0.44–1.00)
GFR, Estimated: 47 mL/min — ABNORMAL LOW (ref >60)
Glucose, Bld: 116 mg/dL — ABNORMAL HIGH (ref 70–99)
Potassium: 4.5 mmol/L (ref 3.5–5.1)
Sodium: 132 mmol/L — ABNORMAL LOW (ref 135–145)
Total Bilirubin: 1.1 mg/dL (ref 0.0–1.2)
Total Protein: 7.3 g/dL (ref 6.5–8.1)

## 2023-04-28 LAB — CBC WITH DIFFERENTIAL/PLATELET
Abs Immature Granulocytes: 0.08 10*3/uL — ABNORMAL HIGH (ref 0.00–0.07)
Basophils Absolute: 0.1 10*3/uL (ref 0.0–0.1)
Basophils Relative: 1 %
Eosinophils Absolute: 0.1 10*3/uL (ref 0.0–0.5)
Eosinophils Relative: 2 %
HCT: 41.8 % (ref 36.0–46.0)
Hemoglobin: 14 g/dL (ref 12.0–15.0)
Immature Granulocytes: 1 %
Lymphocytes Relative: 13 %
Lymphs Abs: 1.1 10*3/uL (ref 0.7–4.0)
MCH: 32 pg (ref 26.0–34.0)
MCHC: 33.5 g/dL (ref 30.0–36.0)
MCV: 95.4 fL (ref 80.0–100.0)
Monocytes Absolute: 0.7 10*3/uL (ref 0.1–1.0)
Monocytes Relative: 8 %
Neutro Abs: 6.8 10*3/uL (ref 1.7–7.7)
Neutrophils Relative %: 75 %
Platelets: 521 10*3/uL — ABNORMAL HIGH (ref 150–400)
RBC: 4.38 MIL/uL (ref 3.87–5.11)
RDW: 14.1 % (ref 11.5–15.5)
WBC: 8.9 10*3/uL (ref 4.0–10.5)
nRBC: 0 % (ref 0.0–0.2)

## 2023-04-28 NOTE — Progress Notes (Signed)
 [facility]  [phone] [phone]  ID: Gina [name] OB: 1927-01-23  MR#: [id number]  BMW#:[id number]  Patient Care Team: [name], PA-C as PCP - General (Family Medicine) [name], MD (Internal Medicine) [name], MD as Referring Physician (Obstetrics and Gynecology) [name], MD (Ophthalmology) [name], MD as Referring Physician (Neurology) [name], MD (Otolaryngology) [name], MD (Dermatology) [name], MD as Consulting Physician (Oncology)  CHIEF COMPLAINT: Essential thrombocytosis  INTERVAL HISTORY: Patient returns to clinic today for repeat laboratory work and routine 55-month evaluation.  She continues to tolerate Hydrea without significant side effects.  She currently feels well and is asymptomatic. She has no neurologic complaints.  She denies any recent fevers or illnesses.  She has a good appetite and denies weight loss.  She denies any chest pain, shortness of breath, cough, or hemoptysis.  She denies any nausea, vomiting, constipation, or diarrhea.  She has no urinary complaints.  Patient offers no specific complaints today.  REVIEW OF SYSTEMS:   Review of Systems  Constitutional: Negative.  Negative for fever, malaise/fatigue and weight loss.  Respiratory: Negative.  Negative for cough, hemoptysis and shortness of breath.   Cardiovascular: Negative.  Negative for chest pain and leg swelling.  Gastrointestinal: Negative.  Negative for abdominal pain.  Genitourinary: Negative.  Negative for dysuria.  Musculoskeletal: Negative.  Negative for back pain.  Skin: Negative.  Negative for rash.  Neurological: Negative.  Negative for focal weakness, weakness and headaches.  Psychiatric/Behavioral: Negative.  The patient is not nervous/anxious.     As per HPI. Otherwise, a complete review of systems is negative.  PAST MEDICAL HISTORY: Past Medical History:  Diagnosis Date    Breast mass in female    right breast   Cataract    Difficult intubation 03/03/2016   September 06, 2013; left nasal fiberoptic intubation #7 ETT; see letter from Dr. 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 Merlene Pulling   Hemorrhoids 07/29/2016   Hyperlipemia    Hyperlipidemia    history of    Hypothyroidism    Mild atherosclerosis of carotid artery 06/25/2015   Morton's neuroma    MRI contraindicated due to metal implant    Prolapse of uterus    Radiculopathy    Renal cyst    Rosacea    Shingles    Thrombocytosis     PAST SURGICAL HISTORY: Past Surgical History:  Procedure Laterality Date   ADENOIDECTOMY     APPENDECTOMY     biopsy, right breast      CATARACT EXTRACTION W/PHACO Left 03/25/2015   Procedure: CATARACT EXTRACTION PHACO AND INTRAOCULAR LENS PLACEMENT (IOC);  Surgeon: [name], MD;  Location: [facility] ORS;  Service: Ophthalmology;  Laterality: Left;  [place] 02:09 AP% 27.3 CDE 59.97 fluid pack lot #[id number]   CESAREAN SECTION     COLON SURGERY     JOINT REPLACEMENT Right    total hip   THYROID LOBECTOMY     THYROIDECTOMY     partial   TONSILLECTOMY     TOTAL HIP ARTHROPLASTY     UPPER GI ENDOSCOPY  10/17/2008    FAMILY HISTORY: Family History  Problem Relation Age of Onset   Stroke Mother    Stroke Father    Dementia Maternal Grandfather  ADVANCED DIRECTIVES (Y/N):  N  HEALTH MAINTENANCE: Social History   Tobacco Use   Smoking status: Former   Smokeless tobacco: Never  Advertising account planner   Vaping status: Never Used  Substance Use Topics   Alcohol use: No    Alcohol/week: 0.0 standard drinks of alcohol   Drug use: No     Colonoscopy:  PAP:  Bone density:  Lipid panel:  Allergies  Allergen Reactions   Novocain [Procaine] Palpitations    Current  Outpatient Medications  Medication Sig Dispense Refill   Acetaminophen 500 MG coapsule Take by mouth every 4 (four) hours as needed for fever.     amLODipine (NORVASC) 5 MG tablet Take 1 tablet (5 mg total) by mouth daily. 90 tablet 1   aspirin EC 81 MG tablet Take 81 mg by mouth daily.      atorvastatin (LIPITOR) 20 MG tablet TAKE 1 TABLET BY MOUTH AT BEDTIME 90 tablet 2   Cholecalciferol (VITAMIN D3) 1000 units CAPS Take by mouth daily.      cyanocobalamin (VITAMIN B12) 1000 MCG tablet Take 1,000 mcg by mouth daily.     famotidine (PEPCID) 20 MG tablet Take 1 tablet (20 mg total) by mouth 2 (two) times daily as needed for heartburn or indigestion (for refractory nausea, indigestion or reflux). 60 tablet 1   hydroxyurea (HYDREA) 500 MG capsule TAKE 1 CAPSULE BY MOUTH 4 DAYS A WEEK (Patient taking differently: No sig reported) 60 capsule 5   irbesartan (AVAPRO) 150 MG tablet Take 1 tablet (150 mg total) by mouth daily. 90 tablet 1   ketoconazole (NIZORAL) 2 % shampoo Apply 1 Application topically 3 (three) times a week. Wash scalp 3 times weekly, let sit 5 minutes and rinse out 120 mL 11   levothyroxine (SYNTHROID) 88 MCG tablet TAKE 1 TABLET BY MOUTH ONCE DAILY BEFORE BREAKFAST 90 tablet 2   mupirocin ointment (BACTROBAN) 2 % Apply to affected area on back with bandage changes until healed. 22 g 0   nystatin cream (MYCOSTATIN) APPLY 1 APPLICATION TOPICALLY TWICE DAILY 30 g 0   nystatin ointment (MYCOSTATIN) Apply 1 application topically 2 (two) times daily. 30 g 0   pantoprazole (PROTONIX) 40 MG tablet TAKE 1 TABLET BY MOUTH AT BEDTIME DO  NOT  EAT  FOR  2  HOURS  PRIOR  TO  MEDS/BEDTIME 90 tablet 0   No current facility-administered medications for this visit.    OBJECTIVE: Vitals:   04/28/23 1022  BP: 131/66  Pulse: 85  Resp: 16  Temp: (!) 96.7 F (35.9 C)  SpO2: 97%     Body mass index is 26.04 kg/m.    ECOG FS:0 - Asymptomatic  General: Well-developed, well-nourished, no acute  distress. Eyes: Pink conjunctiva, anicteric sclera. HEENT: Normocephalic, moist mucous membranes. Lungs: No audible wheezing or coughing. Heart: Regular rate and rhythm. Abdomen: Soft, nontender, no obvious distention. Musculoskeletal: No edema, cyanosis, or clubbing. Neuro: Alert, answering all questions appropriately. Cranial nerves grossly intact. Skin: No rashes or petechiae noted. Psych: Normal affect.   LAB RESULTS:  Lab Results  Component Value Date   NA 132 (L) 04/28/2023   K 4.5 04/28/2023   CL 100 04/28/2023   CO2 24 04/28/2023   GLUCOSE 116 (H) 04/28/2023   BUN 31 (H) 04/28/2023   CREATININE 1.09 (H) 04/28/2023   CALCIUM 9.0 04/28/2023   PROT 7.3 04/28/2023   ALBUMIN 4.2 04/28/2023   AST 19 04/28/2023   ALT 16 04/28/2023   ALKPHOS 86 04/28/2023  BILITOT 1.1 04/28/2023   GFRNONAA 47 (L) 04/28/2023   GFRAA 43 (L) 08/12/2020    Lab Results  Component Value Date   WBC 8.9 04/28/2023   NEUTROABS 6.8 04/28/2023   HGB 14.0 04/28/2023   HCT 41.8 04/28/2023   MCV 95.4 04/28/2023   PLT 521 (H) 04/28/2023     STUDIES: No results found.  ASSESSMENT: Essential thrombocytosis  PLAN:   Essential thrombocytosis: Chronic and unchanged.  Patient's platelet count is 521 today.  Her platelet count has ranged between 390-595 since at least July 2020.  Continue current Hydrea regimen of 4 days a week on Monday, Wednesday, Friday, and Saturday. Continue 81 mg of aspirin.  No further interventions are needed.  Return to clinic in 6 months with repeat laboratory work and further evaluation. Renal insufficiency: Chronic and unchanged.  Patient's GFR is 47. Hyponatremia: Chronic and unchanged.  Patient sodium levels 132 today.   Patient expressed understanding and was in agreement with this plan. She also understands that She can call clinic at any time with any questions, concerns, or complaints.    [name], MD   04/28/2023 12:15 PM

## 2023-04-28 NOTE — Progress Notes (Signed)
 Having concerns about the numbness in her fingers and legs.

## 2023-05-03 ENCOUNTER — Ambulatory Visit: Payer: Medicare Other | Admitting: Obstetrics

## 2023-05-05 DIAGNOSIS — H2511 Age-related nuclear cataract, right eye: Secondary | ICD-10-CM | POA: Diagnosis not present

## 2023-05-05 DIAGNOSIS — Z961 Presence of intraocular lens: Secondary | ICD-10-CM | POA: Diagnosis not present

## 2023-05-05 LAB — HM DIABETES EYE EXAM

## 2023-05-11 ENCOUNTER — Telehealth: Payer: Self-pay | Admitting: Family Medicine

## 2023-05-11 NOTE — Telephone Encounter (Unsigned)
 Copied from CRM [id number]. Topic: General - Other >> May 11, 2023  9:49 AM Franchot Heidelberg wrote: Reason for CRM:   Justice Ashworth from [employer] called to report that they have faxed a plan of care order to the office today and have been faxing this since January. She is asking to have this signed and faxed back urgently.   Best contact: [phone]

## 2023-05-11 NOTE — Telephone Encounter (Signed)
 Returned called to [employer]. Informed we have faxed back and verified fax number [phone]). I informed her I am re-faxed paperwork now.

## 2023-05-31 ENCOUNTER — Telehealth: Payer: Self-pay

## 2023-05-31 NOTE — Telephone Encounter (Signed)
 Copied from CRM [id number]. Topic: Clinical - Medical Advice >> May 31, 2023  9:42 AM Gina [name] wrote: Reason for CRM: The patient is requesting to see if a mammogram is necessary at her age. She feels that she is too old for it and wants to cancel it but is requesting to see what PA-C [name] advises. The patient's call back number is [phone].

## 2023-06-14 ENCOUNTER — Ambulatory Visit: Admitting: Obstetrics & Gynecology

## 2023-06-14 ENCOUNTER — Encounter: Payer: Self-pay | Admitting: Obstetrics & Gynecology

## 2023-06-14 VITALS — BP 127/69 | HR 80 | Ht 64.0 in | Wt 148.0 lb

## 2023-06-14 DIAGNOSIS — N8110 Cystocele, unspecified: Secondary | ICD-10-CM

## 2023-06-14 DIAGNOSIS — N814 Uterovaginal prolapse, unspecified: Secondary | ICD-10-CM

## 2023-06-14 NOTE — Progress Notes (Signed)
    GYNECOLOGY PROGRESS NOTE  Subjective:    Patient ID: Gina [name], female    DOB: 06-17-1926, 89 y.o.   MRN: [id number]  HPI  Patient is a 89 y.o. [id number] who presents for discussion about her pessary. She has not had her pessary in since 03/2023. During that time she has not had any pelvic pain or pressure, no difficulty voiding, and no incontinence. She feels that she no longer wants to wear the pessary.  The following portions of the patient's history were reviewed and updated as appropriate: allergies, current medications, past family history, past medical history, past social history, past surgical history, and problem list.  Review of Systems Pertinent items are noted in HPI.   Objective:   Blood pressure 127/69, pulse 80, height 5\' 4"  (1.626 m), weight 148 lb (67.1 kg). Body mass index is 25.4 kg/m. Well nourished, well hydrated White female, no apparent distress She is  conversing normally. She uses a walker. Pederson speculum used and vaginal ulceration resolved. Grade 1 cystocele noted    Assessment:   Mild cystocele  Plan:   At this time, she will not use the pessary. If something changes, she will make an appt.

## 2023-06-16 ENCOUNTER — Encounter: Payer: Self-pay | Admitting: Family Medicine

## 2023-06-16 ENCOUNTER — Ambulatory Visit: Payer: Self-pay | Admitting: Family Medicine

## 2023-06-16 VITALS — BP 126/68 | HR 79 | Temp 97.5°F | Ht 64.0 in | Wt 149.8 lb

## 2023-06-16 DIAGNOSIS — I10 Essential (primary) hypertension: Secondary | ICD-10-CM | POA: Diagnosis not present

## 2023-06-16 DIAGNOSIS — D473 Essential (hemorrhagic) thrombocythemia: Secondary | ICD-10-CM | POA: Diagnosis not present

## 2023-06-16 DIAGNOSIS — Z5181 Encounter for therapeutic drug level monitoring: Secondary | ICD-10-CM

## 2023-06-16 DIAGNOSIS — N1832 Chronic kidney disease, stage 3b: Secondary | ICD-10-CM | POA: Diagnosis not present

## 2023-06-16 DIAGNOSIS — E1159 Type 2 diabetes mellitus with other circulatory complications: Secondary | ICD-10-CM

## 2023-06-16 DIAGNOSIS — E782 Mixed hyperlipidemia: Secondary | ICD-10-CM | POA: Diagnosis not present

## 2023-06-16 DIAGNOSIS — E890 Postprocedural hypothyroidism: Secondary | ICD-10-CM

## 2023-06-16 DIAGNOSIS — E871 Hypo-osmolality and hyponatremia: Secondary | ICD-10-CM

## 2023-06-16 DIAGNOSIS — E119 Type 2 diabetes mellitus without complications: Secondary | ICD-10-CM

## 2023-06-16 NOTE — Progress Notes (Signed)
 Name: Gina [name]   MRN: [id number]    DOB: 1926/06/08   Date:06/16/2023       Progress Note  Chief Complaint  Patient presents with   Medical Management of Chronic Issues   Hyperlipidemia   Hypertension   Hypothyroidism    Cold intolerance and constipation     Subjective:   Gina [name] is a 89 y.o. female, presents to clinic for routine follow up on chronic conditions  Hyperlipidemia: On statin good compliance no concerns Last Lipids: Lab Results  Component Value Date   CHOL 157 06/24/2022   HDL 59 06/24/2022   LDLCALC 71 06/24/2022   TRIG 200 (H) 06/24/2022   CHOLHDL 2.7 06/24/2022   Hypertension:  Currently managed on irbesartan  and amlodipine  Blood pressure today is well controlled. BP Readings from Last 3 Encounters:  06/16/23 126/68  06/14/23 127/69  04/28/23 131/66    Hypothyroidism: On levothyroxine  88 mcg She reported some symptoms and concerns to the CMA but did not tell me about the symptoms during the office visit Most recent results are below; we will be repeating labs today. Lab Results  Component Value Date   TSH 2.478 09/09/2022   Lab Results  Component Value Date   HGBA1C 6.5 (H) 06/24/2022      Current Outpatient Medications:    Acetaminophen  500 MG coapsule, Take by mouth every 4 (four) hours as needed for fever., Disp: , Rfl:    amLODipine  (NORVASC ) 5 MG tablet, Take 1 tablet (5 mg total) by mouth daily., Disp: 90 tablet, Rfl: 1   aspirin  EC 81 MG tablet, Take 81 mg by mouth daily. , Disp: , Rfl:    atorvastatin  (LIPITOR) 20 MG tablet, TAKE 1 TABLET BY MOUTH AT BEDTIME, Disp: 90 tablet, Rfl: 2   Cholecalciferol (VITAMIN D3) 1000 units CAPS, Take by mouth daily. , Disp: , Rfl:    cyanocobalamin  (VITAMIN B12) 1000 MCG tablet, Take 1,000 mcg by mouth daily., Disp: , Rfl:    famotidine  (PEPCID ) 20 MG tablet, Take 1 tablet (20 mg total) by mouth 2 (two) times daily as needed for heartburn or indigestion (for refractory nausea,  indigestion or reflux)., Disp: 60 tablet, Rfl: 1   hydroxyurea  (HYDREA ) 500 MG capsule, TAKE 1 CAPSULE BY MOUTH 4 DAYS A WEEK (Patient taking differently: No sig reported), Disp: 60 capsule, Rfl: 5   irbesartan  (AVAPRO ) 150 MG tablet, Take 1 tablet (150 mg total) by mouth daily., Disp: 90 tablet, Rfl: 1   ketoconazole  (NIZORAL ) 2 % shampoo, Apply 1 Application topically 3 (three) times a week. Wash scalp 3 times weekly, let sit 5 minutes and rinse out, Disp: 120 mL, Rfl: 11   levothyroxine  (SYNTHROID ) 88 MCG tablet, TAKE 1 TABLET BY MOUTH ONCE DAILY BEFORE BREAKFAST, Disp: 90 tablet, Rfl: 2   mupirocin  ointment (BACTROBAN ) 2 %, Apply to affected area on back with bandage changes until healed., Disp: 22 g, Rfl: 0   nystatin  cream (MYCOSTATIN ), APPLY 1 APPLICATION TOPICALLY TWICE DAILY, Disp: 30 g, Rfl: 0   nystatin  ointment (MYCOSTATIN ), Apply 1 application topically 2 (two) times daily., Disp: 30 g, Rfl: 0   pantoprazole  (PROTONIX ) 40 MG tablet, TAKE 1 TABLET BY MOUTH AT BEDTIME DO  NOT  EAT  FOR  2  HOURS  PRIOR  TO  MEDS/BEDTIME, Disp: 90 tablet, Rfl: 0  Patient Active Problem List   Diagnosis Date Noted   Type 2 diabetes mellitus without complication, without long-term current use of insulin (HCC) 12/16/2022   Hyponatremia  09/09/2022   Prediabetes 06/20/2021   Paresthesia of upper and lower extremities of both sides 03/20/2020   Requires assistance with activities of daily living (ADL) 03/20/2020   Mixed hyperlipidemia 03/19/2020   Anxiety with depression 03/19/2020   Degenerative disc disease, cervical 05/10/2019   De Quervain's tenosynovitis, left 05/10/2019   Bilateral hand numbness 05/10/2019   DNR (do not resuscitate) 04/22/2018   Venous stasis 04/21/2018   Cystocele with prolapse 04/01/2017   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   Essential thrombocytosis (HCC) 07/23/2015   Mild atherosclerosis of carotid artery 06/25/2015   Dyslipidemia 06/03/2015   Diverticulosis 05/28/2015   GERD  without esophagitis 02/06/2015   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) 01/31/2015   Cervical radiculopathy 01/31/2015   Stage 3b chronic kidney disease (HCC) 10/06/2014   Renal cyst 10/06/2014   Cervical radiculopathy due to degenerative joint disease of spine 10/06/2014   History of right hip replacement 10/06/2014   Hypothyroidism 10/05/2014   Essential hypertension 09/13/2014    Past Surgical History:  Procedure Laterality Date   ADENOIDECTOMY     APPENDECTOMY     biopsy, right breast      CATARACT EXTRACTION W/PHACO Left 03/25/2015   Procedure: CATARACT EXTRACTION PHACO AND INTRAOCULAR LENS PLACEMENT (IOC);  Surgeon: Steven Dingeldein, MD;  Location: [facility] ORS;  Service: Ophthalmology;  Laterality: Left;  US  02:09 AP% 27.3 CDE 59.97 fluid pack lot #[id number]   CESAREAN SECTION     COLON SURGERY     JOINT REPLACEMENT Right    total hip   THYROID  LOBECTOMY     THYROIDECTOMY     partial   TONSILLECTOMY     TOTAL HIP ARTHROPLASTY     UPPER GI ENDOSCOPY  10/17/2008    Family History  Problem Relation Age of Onset   Stroke Mother    Stroke Father    Dementia Maternal Grandfather     Social History   Tobacco Use   Smoking status: Former   Smokeless tobacco: Never  Vaping Use   Vaping status: Never Used  Substance Use Topics   Alcohol use: No    Alcohol/week: 0.0 standard drinks of alcohol   Drug use: No     Allergies  Allergen Reactions   Novocain [Procaine] Palpitations    Health Maintenance  Topic Date Due   OPHTHALMOLOGY EXAM  Never done   Zoster Vaccines- Shingrix (1 of 2) Never done   Medicare Annual Wellness (AWV)  01/05/2018   HEMOGLOBIN A1C  12/25/2022   COVID-19 Vaccine (5 - 2024-25 season) 01/20/2023   INFLUENZA VACCINE  09/24/2023   FOOT EXAM  12/16/2023   Pneumonia Vaccine 5+ Years old  Completed   HPV VACCINES  Aged Out   Meningococcal B Vaccine  Aged Out   DTaP/Tdap/Td  Discontinued   MAMMOGRAM  Discontinued   DEXA SCAN  Discontinued    Chart  Review Today: I personally reviewed active problem list, medication list, allergies, family history, social history, health maintenance, notes from last encounter, lab results, imaging with the patient/caregiver today.   Review of Systems  Constitutional: Negative.   HENT: Negative.    Eyes: Negative.   Respiratory: Negative.    Cardiovascular: Negative.   Gastrointestinal: Negative.   Endocrine: Negative.   Genitourinary: Negative.   Musculoskeletal: Negative.   Skin: Negative.   Allergic/Immunologic: Negative.   Neurological: Negative.   Hematological: Negative.   Psychiatric/Behavioral: Negative.    All other systems reviewed and are negative.    Objective:   Vitals:  06/16/23 1104  BP: 126/68  Pulse: 79  Temp: (!) 97.5 F (36.4 C)  TempSrc: Oral  SpO2: 96%  Weight: 149 lb 12.8 oz (67.9 kg)  Height: 5\' 4"  (1.626 m)    Body mass index is 25.71 kg/m.  Physical Exam Vitals and nursing note reviewed.  Constitutional:      General: She is not in acute distress.    Appearance: Normal appearance. She is well-developed. She is not ill-appearing, toxic-appearing or diaphoretic.  HENT:     Head: Normocephalic and atraumatic.     Right Ear: External ear normal.     Left Ear: External ear normal.     Nose: Nose normal.  Eyes:     General:        Right eye: No discharge.        Left eye: No discharge.     Conjunctiva/sclera: Conjunctivae normal.  Neck:     Trachea: No tracheal deviation.  Cardiovascular:     Rate and Rhythm: Normal rate and regular rhythm.     Pulses: Normal pulses.     Heart sounds: Normal heart sounds.  Pulmonary:     Effort: Pulmonary effort is normal. No respiratory distress.     Breath sounds: Normal breath sounds. No stridor. No wheezing, rhonchi or rales.  Musculoskeletal:     Right lower leg: No edema.     Left lower leg: No edema.  Skin:    General: Skin is warm and dry.     Findings: No rash.  Neurological:     Mental Status: She  is alert. Mental status is at baseline.     Motor: No abnormal muscle tone.     Coordination: Coordination normal.  Psychiatric:        Mood and Affect: Mood normal.        Behavior: Behavior normal.      Functional Status Survey:   Results for orders placed or performed in visit on 04/28/23  CMP ([facility] only)   Collection Time: 04/28/23 10:13 AM  Result Value Ref Range   Sodium 132 (L) 135 - 145 mmol/L   Potassium 4.5 3.5 - 5.1 mmol/L   Chloride 100 98 - 111 mmol/L   CO2 24 22 - 32 mmol/L   Glucose, Bld 116 (H) 70 - 99 mg/dL   BUN 31 (H) 8 - 23 mg/dL   Creatinine 1.61 (H) 0.96 - 1.00 mg/dL   Calcium  9.0 8.9 - 10.3 mg/dL   Total Protein 7.3 6.5 - 8.1 g/dL   Albumin 4.2 3.5 - 5.0 g/dL   AST 19 15 - 41 U/L   ALT 16 0 - 44 U/L   Alkaline Phosphatase 86 38 - 126 U/L   Total Bilirubin 1.1 0.0 - 1.2 mg/dL   GFR, Estimated 47 (L) >60 mL/min   Anion gap 8 5 - 15  CBC with Differential/Platelet   Collection Time: 04/28/23 10:13 AM  Result Value Ref Range   WBC 8.9 4.0 - 10.5 K/uL   RBC 4.38 3.87 - 5.11 MIL/uL   Hemoglobin 14.0 12.0 - 15.0 g/dL   HCT 04.5 40.9 - 81.1 %   MCV 95.4 80.0 - 100.0 fL   MCH 32.0 26.0 - 34.0 pg   MCHC 33.5 30.0 - 36.0 g/dL   RDW 91.4 78.2 - 95.6 %   Platelets 521 (H) 150 - 400 K/uL   nRBC 0.0 0.0 - 0.2 %   Neutrophils Relative % 75 %   Neutro Abs 6.8  1.7 - 7.7 K/uL   Lymphocytes Relative 13 %   Lymphs Abs 1.1 0.7 - 4.0 K/uL   Monocytes Relative 8 %   Monocytes Absolute 0.7 0.1 - 1.0 K/uL   Eosinophils Relative 2 %   Eosinophils Absolute 0.1 0.0 - 0.5 K/uL   Basophils Relative 1 %   Basophils Absolute 0.1 0.0 - 0.1 K/uL   Immature Granulocytes 1 %   Abs Immature Granulocytes 0.08 (H) 0.00 - 0.07 K/uL      Assessment & Plan:   Postoperative hypothyroidism Assessment & Plan: Recheck labs and adjust meds per results She reported to CMA some sx, but did not tell me any concerns today and she was quite upbeat and happy about her  birthday coming up - no complaints of energy, weight, bowels, hair/skin changes  Orders: -     TSH  Stage 3b chronic kidney disease (HCC) Assessment & Plan: [facility] Reviewed last OV note/A&P, labs Will recheck labs/eGFR BP still at goal  Orders: -     Comprehensive metabolic panel with GFR  Essential thrombocytosis (HCC) Assessment & Plan: Managed by hematology   Essential hypertension Assessment & Plan: BP at goal today and well controlled, chronic, stable On amlodipine  and irbesartan  BP Readings from Last 3 Encounters:  07/05/23 116/66  06/16/23 126/68  06/14/23 127/69     Orders: -     Comprehensive metabolic panel with GFR  Hyponatremia Assessment & Plan: Had improved with decrease in free water monitoring  Orders: -     Comprehensive metabolic panel with GFR  Type 2 diabetes mellitus without complication, without long-term current use of insulin (HCC) Assessment & Plan: Not on meds On statin and ARB All DM gaps up to date  Orders: -     Comprehensive metabolic panel with GFR -     Hemoglobin A1c -     Microalbumin / creatinine urine ratio  Encounter for medication monitoring -     Comprehensive metabolic panel with GFR -     Hemoglobin A1c -     Lipid panel -     TSH -     Microalbumin / creatinine urine ratio  Mixed hyperlipidemia Assessment & Plan: Due for labs, last done 1 year ago Good statin compliance, tolerating w/o SE or concerns  Orders: -     Comprehensive metabolic panel with GFR -     Lipid panel     Return in about 6 months (around 12/16/2023) for Routine follow-up, needs MWV scheduled/done.   Adeline Hone, PA-C 06/16/23 11:13 AM

## 2023-06-17 LAB — COMPREHENSIVE METABOLIC PANEL WITH GFR
AG Ratio: 1.5 (calc) (ref 1.0–2.5)
ALT: 15 U/L (ref 6–29)
AST: 18 U/L (ref 10–35)
Albumin: 4.5 g/dL (ref 3.6–5.1)
Alkaline phosphatase (APISO): 93 U/L (ref 37–153)
BUN/Creatinine Ratio: 26 (calc) — ABNORMAL HIGH (ref 6–22)
BUN: 28 mg/dL — ABNORMAL HIGH (ref 7–25)
CO2: 26 mmol/L (ref 20–32)
Calcium: 10.1 mg/dL (ref 8.6–10.4)
Chloride: 102 mmol/L (ref 98–110)
Creat: 1.07 mg/dL — ABNORMAL HIGH (ref 0.60–0.95)
Globulin: 3.1 g/dL (ref 1.9–3.7)
Glucose, Bld: 97 mg/dL (ref 65–99)
Potassium: 5.4 mmol/L — ABNORMAL HIGH (ref 3.5–5.3)
Sodium: 138 mmol/L (ref 135–146)
Total Bilirubin: 1.1 mg/dL (ref 0.2–1.2)
Total Protein: 7.6 g/dL (ref 6.1–8.1)
eGFR: 48 mL/min/{1.73_m2} — ABNORMAL LOW (ref >=60)

## 2023-06-17 LAB — LIPID PANEL
Cholesterol: 174 mg/dL (ref <200)
HDL: 52 mg/dL (ref >=50)
LDL Cholesterol (Calc): 91 mg/dL
Non-HDL Cholesterol (Calc): 122 mg/dL (ref <130)
Total CHOL/HDL Ratio: 3.3 (calc) (ref <5.0)
Triglycerides: 215 mg/dL — ABNORMAL HIGH (ref <150)

## 2023-06-17 LAB — HEMOGLOBIN A1C
Hgb A1c MFr Bld: 6.3 % — ABNORMAL HIGH (ref <5.7)
Mean Plasma Glucose: 134 mg/dL
eAG (mmol/L): 7.4 mmol/L

## 2023-06-17 LAB — TSH: TSH: 2.21 m[IU]/L (ref 0.40–4.50)

## 2023-06-21 ENCOUNTER — Other Ambulatory Visit: Payer: Self-pay | Admitting: Family Medicine

## 2023-06-21 ENCOUNTER — Encounter: Payer: Self-pay | Admitting: Family Medicine

## 2023-06-21 DIAGNOSIS — N1832 Chronic kidney disease, stage 3b: Secondary | ICD-10-CM

## 2023-06-21 DIAGNOSIS — E782 Mixed hyperlipidemia: Secondary | ICD-10-CM

## 2023-06-21 DIAGNOSIS — I10 Essential (primary) hypertension: Secondary | ICD-10-CM

## 2023-06-21 DIAGNOSIS — E890 Postprocedural hypothyroidism: Secondary | ICD-10-CM

## 2023-06-21 MED ORDER — ATORVASTATIN CALCIUM 20 MG PO TABS: 20.0000 mg | ORAL_TABLET | Freq: Every day | ORAL | 3 refills | Status: AC

## 2023-06-21 MED ORDER — LEVOTHYROXINE SODIUM 88 MCG PO TABS: ORAL_TABLET | ORAL | 3 refills | Status: AC

## 2023-06-21 MED ORDER — IRBESARTAN 150 MG PO TABS: 150.0000 mg | ORAL_TABLET | Freq: Every day | ORAL | 1 refills | Status: DC

## 2023-06-21 MED ORDER — AMLODIPINE BESYLATE 5 MG PO TABS: 5.0000 mg | ORAL_TABLET | Freq: Every day | ORAL | 1 refills | Status: DC

## 2023-07-03 DIAGNOSIS — J069 Acute upper respiratory infection, unspecified: Secondary | ICD-10-CM | POA: Diagnosis not present

## 2023-07-03 DIAGNOSIS — R0981 Nasal congestion: Secondary | ICD-10-CM | POA: Diagnosis not present

## 2023-07-03 DIAGNOSIS — R051 Acute cough: Secondary | ICD-10-CM | POA: Diagnosis not present

## 2023-07-05 ENCOUNTER — Ambulatory Visit (INDEPENDENT_AMBULATORY_CARE_PROVIDER_SITE_OTHER): Admitting: Nurse Practitioner

## 2023-07-05 ENCOUNTER — Other Ambulatory Visit: Payer: Self-pay | Admitting: Nurse Practitioner

## 2023-07-05 ENCOUNTER — Encounter: Payer: Self-pay | Admitting: Nurse Practitioner

## 2023-07-05 ENCOUNTER — Ambulatory Visit
Admission: RE | Admit: 2023-07-05 | Discharge: 2023-07-05 | Disposition: A | Discharge status: Home / Self Care | Source: Ambulatory Visit | Attending: Nurse Practitioner | Admitting: Nurse Practitioner

## 2023-07-05 ENCOUNTER — Ambulatory Visit
Admission: RE | Admit: 2023-07-05 | Discharge: 2023-07-05 | Disposition: A | Discharge status: Home / Self Care | Attending: Nurse Practitioner | Admitting: Nurse Practitioner

## 2023-07-05 VITALS — BP 116/66 | HR 97 | Temp 98.3°F | Ht 64.0 in | Wt 145.9 lb

## 2023-07-05 DIAGNOSIS — R051 Acute cough: Secondary | ICD-10-CM

## 2023-07-05 DIAGNOSIS — R062 Wheezing: Secondary | ICD-10-CM | POA: Diagnosis not present

## 2023-07-05 DIAGNOSIS — R059 Cough, unspecified: Secondary | ICD-10-CM | POA: Diagnosis not present

## 2023-07-05 DIAGNOSIS — Z87891 Personal history of nicotine dependence: Secondary | ICD-10-CM | POA: Diagnosis not present

## 2023-07-05 MED ORDER — PREDNISONE 10 MG (21) PO TBPK: ORAL_TABLET | ORAL | 0 refills | Status: DC

## 2023-07-05 NOTE — Assessment & Plan Note (Signed)
 BP at goal today and well controlled, chronic, stable On amlodipine  and irbesartan  BP Readings from Last 3 Encounters:  07/05/23 116/66  06/16/23 126/68  06/14/23 127/69

## 2023-07-05 NOTE — Assessment & Plan Note (Signed)
 Had improved with decrease in free water monitoring

## 2023-07-05 NOTE — Assessment & Plan Note (Signed)
 Due for labs, last done 1 year ago Good statin compliance, tolerating w/o SE or concerns

## 2023-07-05 NOTE — Assessment & Plan Note (Signed)
 Not on meds On statin and ARB All DM gaps up to date

## 2023-07-05 NOTE — Assessment & Plan Note (Signed)
 Recheck labs and adjust meds per results She reported to CMA some sx, but did not tell me any concerns today and she was quite upbeat and happy about her birthday coming up - no complaints of energy, weight, bowels, hair/skin changes

## 2023-07-05 NOTE — Assessment & Plan Note (Signed)
 Seeing Dr[facility] Zel[facility]ickman at [facility] Reviewed last OV note/A&P, labs Will recheck labs/eGFR BP still at goal

## 2023-07-05 NOTE — Assessment & Plan Note (Signed)
 Managed by hematology

## 2023-07-05 NOTE — Patient Instructions (Signed)
 Delsym

## 2023-07-05 NOTE — Progress Notes (Signed)
 BP 116/66   Pulse 97   Temp 98.3 F (36.8 C)   Ht 5\' 4"  (1.626 m)   Wt 145 lb 14.4 oz (66.2 kg)   SpO2 98%   BMI 25.04 kg/m    Subjective:    Patient ID: Gina [name], female    DOB: January 15, 1927, 89 y.o.   MRN: [id number]  HPI: Gina [name] is a 89 y.o. female  Chief Complaint  Patient presents with   URI    Onset Friday symptoms include: cough and congestion.  Went to urgentcare on sat. Given cough med but did not take due to side effects. Flu/covid neg    Discussed the use of AI scribe software for clinical note transcription with the patient, who gave verbal consent to proceed.  History of Present Illness Gina [name] is a 89 year old female who presents with cough and congestion.  She has been experiencing cough and congestion since Friday. She visited an urgent care facility on Saturday, where she was tested for flu and COVID, both of which were negative. She was prescribed Tessalon Perles and an albuterol inhaler. She did not take the Tessalon Perles due to concerns about its potency but has been using the albuterol inhaler, which she finds helpful despite some difficulty coordinating its use due to her age. Denies any fever or shortness of breath.   She is concerned about the possibility of developing pneumonia, especially given her age. She continues to experience shortness of breath and is worried about the congestion worsening. Living in an assisted living facility, she is also concerned about the contagiousness of her condition.  She recalls having taken steroids once before for knee pain but is hesitant to take them again. She inquires about over-the-counter options for managing her cough and congestion, specifically asking about Delsym, which she plans to use to help suppress her cough.         07/05/2023    8:13 AM 12/16/2022    9:59 AM 09/30/2022   10:53 AM  Depression screen PHQ 2/9  Decreased Interest 0 0 0  Down, Depressed, Hopeless 0 0 0  PHQ - 2 Score  0 0 0  Altered sleeping 0 0 0  Tired, decreased energy 0 0 0  Change in appetite 0 0 0  Feeling bad or failure about yourself  0 0 0  Trouble concentrating 0 0 0  Moving slowly or fidgety/restless 0 0 0  Suicidal thoughts 0 0 0  PHQ-9 Score 0 0 0  Difficult doing work/chores Not difficult at all Not difficult at all Not difficult at all    Relevant past medical, surgical, family and social history reviewed and updated as indicated. Interim medical history since our last visit reviewed. Allergies and medications reviewed and updated.  Review of Systems  Ten systems reviewed and is negative except as mentioned in HPI      Objective:      BP 116/66   Pulse 97   Temp 98.3 F (36.8 C)   Ht 5\' 4"  (1.626 m)   Wt 145 lb 14.4 oz (66.2 kg)   SpO2 98%   BMI 25.04 kg/m    Wt Readings from Last 3 Encounters:  07/05/23 145 lb 14.4 oz (66.2 kg)  06/16/23 149 lb 12.8 oz (67.9 kg)  06/14/23 148 lb (67.1 kg)    Physical Exam Vitals reviewed.  Constitutional:      Appearance: Normal appearance.  HENT:     Head: Normocephalic.  Cardiovascular:  Rate and Rhythm: Normal rate and regular rhythm.  Pulmonary:     Effort: Pulmonary effort is normal.     Breath sounds: Wheezing and rhonchi present.     Comments: Right lung Musculoskeletal:        General: Normal range of motion.  Skin:    General: Skin is warm and dry.  Neurological:     General: No focal deficit present.     Mental Status: She is alert and oriented to person, place, and time. Mental status is at baseline.  Psychiatric:        Mood and Affect: Mood normal.        Behavior: Behavior normal.        Thought Content: Thought content normal.        Judgment: Judgment normal.             Assessment & Plan:   Problem List Items Addressed This Visit   None Visit Diagnoses       Wheezing    -  Primary   Relevant Orders   DG Chest 2 View     Acute cough       Relevant Orders   DG Chest 2 View         Assessment and Plan Assessment & Plan Cough and congestion Cough and congestion since Friday, negative for flu and COVID. Wheezing on the right side raises concern for possible pneumonia. She is apprehensive about prescribed medications due to perceived severity and prefers to avoid steroids, seeking less risky alternatives for cough management. - Order chest x-ray to evaluate for pneumonia - Recommend Delsym for cough suppression - Advise wearing a mask in assisted living until diagnosis is confirmed  Wheezing Wheezing on the right side suggests possible obstruction or infection. She experiences difficulty using the albuterol inhaler due to coordination issues but reports some relief from its use. - Continue albuterol inhaler as needed for wheezing  Possible pneumonia Concern for pneumonia due to wheezing and congestion, particularly on the right side. She is concerned about the risk of pneumonia given her age and potential contagion. - Order chest x-ray to confirm or rule out pneumonia - Consider antibiotics based on chest x-ray results        Follow up plan: Return if symptoms worsen or fail to improve.

## 2023-07-13 ENCOUNTER — Ambulatory Visit (INDEPENDENT_AMBULATORY_CARE_PROVIDER_SITE_OTHER): Admitting: Family Medicine

## 2023-07-13 ENCOUNTER — Encounter: Payer: Self-pay | Admitting: Family Medicine

## 2023-07-13 VITALS — BP 118/70 | HR 76 | Temp 97.9°F | Resp 16 | Ht 64.0 in | Wt 145.0 lb

## 2023-07-13 DIAGNOSIS — J069 Acute upper respiratory infection, unspecified: Secondary | ICD-10-CM | POA: Diagnosis not present

## 2023-07-13 DIAGNOSIS — H6993 Unspecified Eustachian tube disorder, bilateral: Secondary | ICD-10-CM | POA: Diagnosis not present

## 2023-07-13 DIAGNOSIS — J329 Chronic sinusitis, unspecified: Secondary | ICD-10-CM | POA: Diagnosis not present

## 2023-07-13 MED ORDER — LORATADINE 10 MG PO TABS: 10.0000 mg | ORAL_TABLET | Freq: Every evening | ORAL | 1 refills | Status: AC | PRN

## 2023-07-13 MED ORDER — FLUTICASONE PROPIONATE 50 MCG/ACT NA SUSP: 2.0000 | Freq: Every day | NASAL | 2 refills | Status: DC

## 2023-07-13 NOTE — Patient Instructions (Signed)
 Please reach out to me if you feel suddenly worse and I will send in antibiotics to treat for a bacterial sinus infection.  Today I do not think antibiotics are indicated.  Sudden worsening would be acutely worse sinus pain and pressure, tenderness when you touch your face/cheeks, new fever, body aches

## 2023-07-13 NOTE — Progress Notes (Signed)
 Patient ID: Gina [name], female    DOB: 07-03-1926, 89 y.o.   MRN: [id number]  PCP: Adeline Hone, PA-C  Chief Complaint  Patient presents with   Cough    X1 week, xray neg for pneumonia. Neg for COVID/Flu from urgent care. Productive. Never took prednisone  or benzonatate's prescribed.    Subjective:   Gina [name] is a 89 y.o. female, presents to clinic with CC of the following:  HPI  Pt presents for f/up with 10+ days of URI sx She has been seen by urgent care and by Concha Deed nurse practitioner in this office she has not taken any medications prescribed to her She reports overall her cough and chest congestion is improving, she does not have any pain with breathing, wheeze or shortness of breath Her cough is more productive Her main complaint is continued sinus and facial and ear pressure and congestion She has not had severe headaches, sinus tenderness, sore throat, fever sweats chills but some slightly decreased appetite and otherwise good and normal energy She does have a history of allergies seasonal but is not taking any medications for allergies   Patient Active Problem List   Diagnosis Date Noted   Type 2 diabetes mellitus without complication, without long-term current use of insulin (HCC) 12/16/2022   Hyponatremia 09/09/2022   Prediabetes 06/20/2021   Paresthesia of upper and lower extremities of both sides 03/20/2020   Requires assistance with activities of daily living (ADL) 03/20/2020   Mixed hyperlipidemia 03/19/2020   Anxiety with depression 03/19/2020   Degenerative disc disease, cervical 05/10/2019   De Quervain's tenosynovitis, left 05/10/2019   Bilateral hand numbness 05/10/2019   DNR (do not resuscitate) 04/22/2018   Venous stasis 04/21/2018   Cystocele with prolapse 04/01/2017   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   Essential thrombocytosis (HCC) 07/23/2015   Mild atherosclerosis of carotid artery 06/25/2015   Dyslipidemia 06/03/2015   Diverticulosis  05/28/2015   GERD without esophagitis 02/06/2015   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) 01/31/2015   Cervical radiculopathy 01/31/2015   Stage 3b chronic kidney disease (HCC) 10/06/2014   Renal cyst 10/06/2014   Cervical radiculopathy due to degenerative joint disease of spine 10/06/2014   History of right hip replacement 10/06/2014   Hypothyroidism 10/05/2014   Essential hypertension 09/13/2014      Current Outpatient Medications:    Acetaminophen  500 MG coapsule, Take by mouth every 4 (four) hours as needed for fever., Disp: , Rfl:    amLODipine  (NORVASC ) 5 MG tablet, Take 1 tablet (5 mg total) by mouth daily., Disp: 90 tablet, Rfl: 1   aspirin  EC 81 MG tablet, Take 81 mg by mouth daily. , Disp: , Rfl:    atorvastatin  (LIPITOR) 20 MG tablet, Take 1 tablet (20 mg total) by mouth at bedtime., Disp: 90 tablet, Rfl: 3   Cholecalciferol (VITAMIN D3) 1000 units CAPS, Take by mouth daily. , Disp: , Rfl:    cyanocobalamin  (VITAMIN B12) 1000 MCG tablet, Take 1,000 mcg by mouth daily., Disp: , Rfl:    famotidine  (PEPCID ) 20 MG tablet, Take 1 tablet (20 mg total) by mouth 2 (two) times daily as needed for heartburn or indigestion (for refractory nausea, indigestion or reflux)., Disp: 60 tablet, Rfl: 1   hydroxyurea  (HYDREA ) 500 MG capsule, TAKE 1 CAPSULE BY MOUTH 4 DAYS A WEEK (Patient taking differently: No sig reported), Disp: 60 capsule, Rfl: 5   irbesartan  (AVAPRO ) 150 MG tablet, Take 1 tablet (150 mg total) by mouth daily., Disp: 90 tablet, Rfl:  1   ketoconazole  (NIZORAL ) 2 % shampoo, Apply 1 Application topically 3 (three) times a week. Wash scalp 3 times weekly, let sit 5 minutes and rinse out, Disp: 120 mL, Rfl: 11   levothyroxine  (SYNTHROID ) 88 MCG tablet, TAKE 1 TABLET BY MOUTH ONCE DAILY BEFORE BREAKFAST, Disp: 90 tablet, Rfl: 3   mupirocin  ointment (BACTROBAN ) 2 %, Apply to affected area on back with bandage changes until healed., Disp: 22 g, Rfl: 0   nystatin  cream (MYCOSTATIN ), APPLY 1  APPLICATION TOPICALLY TWICE DAILY, Disp: 30 g, Rfl: 0   predniSONE  (STERAPRED UNI-PAK 21 TAB) 10 MG (21) TBPK tablet, Use as directed. (Patient not taking: Reported on 07/13/2023), Disp: 21 each, Rfl: 0   Allergies  Allergen Reactions   Novocain [Procaine] Palpitations     Social History   Tobacco Use   Smoking status: Former   Smokeless tobacco: Never  Vaping Use   Vaping status: Never Used  Substance Use Topics   Alcohol use: No    Alcohol/week: 0.0 standard drinks of alcohol   Drug use: No      Chart Review Today: I personally reviewed active problem list, medication list, allergies, family history, social history, health maintenance, notes from last encounter, lab results, imaging with the patient/caregiver today.   Review of Systems  All other systems reviewed and are negative.      Objective:   Vitals:   07/13/23 1108  BP: 118/70  Pulse: 76  Resp: 16  Temp: 97.9 F (36.6 C)  SpO2: 95%  Weight: 145 lb (65.8 kg)  Height: 5\' 4"  (1.626 m)    Body mass index is 24.89 kg/m.  Physical Exam Vitals and nursing note reviewed.  Constitutional:      General: She is not in acute distress.    Appearance: Normal appearance. She is well-developed and normal weight. She is not ill-appearing, toxic-appearing or diaphoretic.  HENT:     Head: Normocephalic and atraumatic.     Right Ear: Tympanic membrane, ear canal and external ear normal. There is no impacted cerumen.     Left Ear: Tympanic membrane, ear canal and external ear normal. There is no impacted cerumen.     Nose: Mucosal edema, congestion and rhinorrhea present. Rhinorrhea is clear.     Right Nostril: No septal hematoma or occlusion.     Left Nostril: No septal hematoma or occlusion.     Right Sinus: No maxillary sinus tenderness or frontal sinus tenderness.     Left Sinus: No maxillary sinus tenderness or frontal sinus tenderness.     Mouth/Throat:     Mouth: Mucous membranes are moist.     Pharynx:  Oropharynx is clear. No oropharyngeal exudate or posterior oropharyngeal erythema.  Eyes:     General: No scleral icterus.       Right eye: No discharge.        Left eye: No discharge.     Conjunctiva/sclera: Conjunctivae normal.  Neck:     Trachea: No tracheal deviation.  Cardiovascular:     Rate and Rhythm: Normal rate and regular rhythm.     Pulses: Normal pulses.     Heart sounds: Normal heart sounds.  Pulmonary:     Effort: Pulmonary effort is normal. No respiratory distress.     Breath sounds: No stridor. Rhonchi present. No wheezing or rales.  Chest:     Chest wall: No tenderness.  Musculoskeletal:        General: Normal range of motion.  Cervical back: Normal range of motion.  Lymphadenopathy:     Cervical: No cervical adenopathy.  Skin:    General: Skin is warm and dry.     Findings: No rash.  Neurological:     Mental Status: She is alert.     Motor: No abnormal muscle tone.     Coordination: Coordination normal.  Psychiatric:        Behavior: Behavior normal.      Results for orders placed or performed in visit on 06/16/23  HM DIABETES EYE EXAM   Collection Time: 05/05/23  2:58 PM  Result Value Ref Range   HM Diabetic Eye Exam No Retinopathy No Retinopathy       Assessment & Plan:   1. Rhinosinusitis (Primary) Sx onset more than 10 d ago, congestion and pressure, but no acutely worsening pain, no fever On exam nasal mucosa is moderately swollen and erythematous with clear discharge she has no sinus tenderness to palpation on exam and at this time I do not feel like antibiotics are indicated She may get some benefit with saline sinus sprays or rinses, taking antihistamine and doing a intranasal steroid spray Continue watchful waiting - loratadine (CLARITIN) 10 MG tablet; Take 1 tablet (10 mg total) by mouth at bedtime as needed for rhinitis (sinus/nasal symptoms).  Dispense: 30 tablet; Refill: 1 - fluticasone  (FLONASE ) 50 MCG/ACT nasal spray; Place 2  sprays into both nostrils daily.  Dispense: 16 g; Refill: 2  2. Upper respiratory tract infection, unspecified type She has some very faint scattered rhonchi but no wheeze and no rails with recent chest x-ray being negative and improving pulmonary/cough symptoms since last week without taking prednisone  or Tessalon Perles She has no new concerning symptoms including no pain with breathing no exertional shortness of breath no tightness or wheeze and her cough has significantly improved Continue supportive and symptomatic management with watchful waiting At this time I do not think she needs to start the steroids and with reported minimal coughing she can still try tessalon perles prn for cough, may want to try mucinex  3. Dysfunction of both eustachian tubes Explained TM and ears are normal appearing  Likely due to URI, ETD, explained tx with antihistamines steroid no sprays and decongestants however I do not feel like she would tolerate Sudafed she can try the medications below and continued watchful waiting - loratadine (CLARITIN) 10 MG tablet; Take 1 tablet (10 mg total) by mouth at bedtime as needed for rhinitis (sinus/nasal symptoms).  Dispense: 30 tablet; Refill: 1 - fluticasone  (FLONASE ) 50 MCG/ACT nasal spray; Place 2 sprays into both nostrils daily.  Dispense: 16 g; Refill: 2   did ask pt to call us  if any acute worsening - abx would be indicated for bacterial sinusitis if acute worsening of sinus pain/pressure or new fever, but today on exam I do not feel abx are indicated and overall with 10+ days of illness she largely reports feeling gradually better - hope she will continue to have sx gradually improve, explained it is not unusual to have viral URI sx for a few weeks.  Any pt calls should be routed to me to handle for Ms. Karyme in light of our discussion today Pt escorted out of the exam room and the office personally and observed to have no increased work of breathing with ambulation  with her rolling walker she left the office waiting room with her son today   Adeline Hone, PA-C 07/13/23 11:48 AM

## 2023-08-09 ENCOUNTER — Ambulatory Visit: Payer: Medicare HMO | Admitting: Dermatology

## 2023-08-09 ENCOUNTER — Encounter: Payer: Self-pay | Admitting: Dermatology

## 2023-08-09 DIAGNOSIS — Z1283 Encounter for screening for malignant neoplasm of skin: Secondary | ICD-10-CM | POA: Diagnosis not present

## 2023-08-09 DIAGNOSIS — L729 Follicular cyst of the skin and subcutaneous tissue, unspecified: Secondary | ICD-10-CM

## 2023-08-09 DIAGNOSIS — D229 Melanocytic nevi, unspecified: Secondary | ICD-10-CM

## 2023-08-09 DIAGNOSIS — D225 Melanocytic nevi of trunk: Secondary | ICD-10-CM

## 2023-08-09 DIAGNOSIS — L219 Seborrheic dermatitis, unspecified: Secondary | ICD-10-CM

## 2023-08-09 DIAGNOSIS — L578 Other skin changes due to chronic exposure to nonionizing radiation: Secondary | ICD-10-CM | POA: Diagnosis not present

## 2023-08-09 DIAGNOSIS — D2221 Melanocytic nevi of right ear and external auricular canal: Secondary | ICD-10-CM

## 2023-08-09 DIAGNOSIS — D2271 Melanocytic nevi of right lower limb, including hip: Secondary | ICD-10-CM

## 2023-08-09 DIAGNOSIS — D1801 Hemangioma of skin and subcutaneous tissue: Secondary | ICD-10-CM

## 2023-08-09 DIAGNOSIS — D2239 Melanocytic nevi of other parts of face: Secondary | ICD-10-CM

## 2023-08-09 DIAGNOSIS — L821 Other seborrheic keratosis: Secondary | ICD-10-CM

## 2023-08-09 DIAGNOSIS — Z85828 Personal history of other malignant neoplasm of skin: Secondary | ICD-10-CM

## 2023-08-09 DIAGNOSIS — L814 Other melanin hyperpigmentation: Secondary | ICD-10-CM

## 2023-08-09 DIAGNOSIS — W908XXA Exposure to other nonionizing radiation, initial encounter: Secondary | ICD-10-CM

## 2023-08-09 DIAGNOSIS — L720 Epidermal cyst: Secondary | ICD-10-CM

## 2023-08-09 MED ORDER — KETOCONAZOLE 2 % EX SHAM: 1.0000 | MEDICATED_SHAMPOO | CUTANEOUS | 11 refills | Status: AC

## 2023-08-09 NOTE — Patient Instructions (Signed)

## 2023-08-09 NOTE — Progress Notes (Signed)
 Follow-Up Visit   Subjective  Gina [name] is a 89 y.o. female who presents for the following: Skin Cancer Screening and Full Body Skin Exam  The patient presents for Total-Body Skin Exam (TBSE) for skin cancer screening and mole check. The patient has spots, moles and lesions to be evaluated, some may be new or changing.  She has a spot to check on the left shoulder area. Seborrheic dermatitis is controlled with ketoconazole  2% shampoo and ketoconazole  cream. History of Aks.   The following portions of the chart were reviewed this encounter and updated as appropriate: medications, allergies, medical history  Review of Systems:  No other skin or systemic complaints except as noted in HPI or Assessment and Plan.  Objective  Well appearing patient in no apparent distress; mood and affect are within normal limits.  A full examination was performed including scalp, head, eyes, ears, nose, lips, neck, chest, axillae, abdomen, back, buttocks, bilateral upper extremities, bilateral lower extremities, hands, feet, fingers, toes, fingernails, and toenails. All findings within normal limits unless otherwise noted below.   Relevant physical exam findings are noted in the Assessment and Plan.    Assessment & Plan   SKIN CANCER SCREENING PERFORMED TODAY.  ACTINIC DAMAGE - Chronic condition, secondary to cumulative UV/sun exposure - diffuse scaly erythematous macules with underlying dyspigmentation - Recommend daily broad spectrum sunscreen SPF 30+ to sun-exposed areas, reapply every 2 hours as needed.  - Staying in the shade or wearing long sleeves, sun glasses (UVA+UVB protection) and wide brim hats (4-inch brim around the entire circumference of the hat) are also recommended for sun protection.  - Call for new or changing lesions.  LENTIGINES, SEBORRHEIC KERATOSES, HEMANGIOMAS - Benign normal skin lesions - SK left shoulder- waxy tan stuck on papule - Benign-appearing - Call for any  changes  MELANOCYTIC NEVI - Tan-brown and/or pink-flesh-colored symmetric macules and papules, including flesh papule left lateral eyebrow, right medial cheek  - R spinal upper back- 7.0 mm speckled brown papule - R top of ear helix- 5 mm flesh papule - R foot dorsum- 2 mm med dark brown macule - Benign appearing on exam today - Observation - Call clinic for new or changing moles - Recommend daily use of broad spectrum spf 30+ sunscreen to sun-exposed areas.   EPIDERMAL INCLUSION CYST Exam: Subcutaneous nodule at spinal mid back  Benign-appearing. Exam most consistent with an epidermal inclusion cyst. Discussed that a cyst is a benign growth that can grow over time and sometimes get irritated or inflamed. Recommend observation if it is not bothersome. Discussed option of surgical excision to remove it if it is growing, symptomatic, or other changes noted. Please call for new or changing lesions so they can be evaluated.   SEBORRHEIC DERMATITIS Exam: Scalp and face clear   Chronic condition with duration or expected duration over one year. Currently well-controlled.      Seborrheic Dermatitis is a chronic persistent rash characterized by pinkness and scaling most commonly of the mid face but also can occur on the scalp (dandruff), ears; mid chest, mid back and groin.  It tends to be exacerbated by stress and cooler weather.  People who have neurologic disease may experience new onset or exacerbation of existing seborrheic dermatitis.  The condition is not curable but treatable and can be controlled.   Treatment Plan: Cont Ketoconazole  2% shampoo ~2-3x/wk, let sit 5 minutes before rinsing out Cont Ketoconazole  2% cr once daily to affected areas on face  HISTORY OF  SKIN CANCER R paranasal - Clear. Observe for recurrence.  - Call clinic for new or changing lesions.   - Recommend regular skin exams, daily broad-spectrum spf 30+ sunscreen use, and photoprotection.      Return in about  1 year (around 08/08/2024) for TBSE.  IBernardine Bridegroom, CMA, am acting as scribe for Artemio Larry, MD .   Documentation: I have reviewed the above documentation for accuracy and completeness, and I agree with the above.  Artemio Larry, MD

## 2023-08-11 DIAGNOSIS — K080 Exfoliation of teeth due to systemic causes: Secondary | ICD-10-CM | POA: Diagnosis not present

## 2023-08-26 ENCOUNTER — Other Ambulatory Visit: Payer: Self-pay | Admitting: Oncology

## 2023-08-26 DIAGNOSIS — D75839 Thrombocytosis, unspecified: Secondary | ICD-10-CM

## 2023-08-26 DIAGNOSIS — D473 Essential (hemorrhagic) thrombocythemia: Secondary | ICD-10-CM

## 2023-09-01 ENCOUNTER — Encounter: Payer: Self-pay | Admitting: Family Medicine

## 2023-09-01 ENCOUNTER — Ambulatory Visit: Payer: Self-pay

## 2023-09-01 ENCOUNTER — Ambulatory Visit (INDEPENDENT_AMBULATORY_CARE_PROVIDER_SITE_OTHER): Admitting: Family Medicine

## 2023-09-01 VITALS — BP 130/72 | HR 94 | Temp 97.6°F | Resp 16 | Ht 64.0 in | Wt 146.0 lb

## 2023-09-01 DIAGNOSIS — R202 Paresthesia of skin: Secondary | ICD-10-CM

## 2023-09-01 DIAGNOSIS — N3000 Acute cystitis without hematuria: Secondary | ICD-10-CM

## 2023-09-01 DIAGNOSIS — R350 Frequency of micturition: Secondary | ICD-10-CM | POA: Diagnosis not present

## 2023-09-01 DIAGNOSIS — N814 Uterovaginal prolapse, unspecified: Secondary | ICD-10-CM | POA: Diagnosis not present

## 2023-09-01 DIAGNOSIS — Z7409 Other reduced mobility: Secondary | ICD-10-CM

## 2023-09-01 DIAGNOSIS — K5901 Slow transit constipation: Secondary | ICD-10-CM

## 2023-09-01 LAB — POCT URINALYSIS DIPSTICK
Appearance: NORMAL
Bilirubin, UA: NEGATIVE
Blood, UA: NEGATIVE
Glucose, UA: NEGATIVE
Ketones, UA: NEGATIVE
Leukocytes, UA: NEGATIVE
Nitrite, UA: NEGATIVE
Protein, UA: NEGATIVE
Spec Grav, UA: 1.01 (ref 1.010–1.025)
Urobilinogen, UA: 0.2 U/dL
pH, UA: 6 (ref 5.0–8.0)

## 2023-09-01 MED ORDER — CEPHALEXIN 500 MG PO CAPS: 500.0000 mg | ORAL_CAPSULE | Freq: Two times a day (BID) | ORAL | 0 refills | Status: DC

## 2023-09-01 NOTE — Telephone Encounter (Signed)
 FYI Only or Action Required?: FYI only for provider.  Patient was last seen in primary care on 07/05/2023 by Gareth Mliss FALCON, FNP.  Called Nurse Triage reporting Urinary Frequency.  Symptoms began a week ago.  Interventions attempted: Rest, hydration, or home remedies.  Symptoms are: unchanged.  Triage Disposition: See Physician Within 24 Hours  Patient/caregiver understands and will follow disposition?: Yes  Copied from CRM [id number]. Topic: Clinical - Red Word Triage >> Sep 01, 2023  9:01 AM Charlet HERO wrote: Red Word that prompted transfer to Nurse Triage: Patient Is calling about frequent urinating and discomfort in lower abdomen. For a week. Reason for Disposition  Age > 50 years  Answer Assessment - Initial Assessment Questions 1. SEVERITY: How bad is the pain?  (e.g., Scale 1-10; mild, moderate, or severe)   - MILD (1-3): complains slightly about urination hurting   - MODERATE (4-7): interferes with normal activities     - SEVERE (8-10): excruciating, unwilling or unable to urinate because of the pain      burning 2. FREQUENCY: How many times have you had painful urination today?      Increased 3. PATTERN: Is pain present every time you urinate or just sometimes?      Yes, burning 4. ONSET: When did the painful urination start?      One week 5. FEVER: Do you have a fever? If Yes, ask: What is your temperature, how was it measured, and when did it start?     Afebrile   6. CAUSE: What do you think is causing the painful urination?  (e.g., UTI, scratch, Herpes sore)     uti 7. OTHER SYMPTOMS: Do you have any other symptoms? (e.g., blood in urine, flank pain, genital sores, urgency, vaginal discharge)     Had been constipated but used Miralax and now having bm.  Additional info:  Patient needs to coordinate ride for appointment. Offer Leisa today 11:20, 1, or 2, patient needs to disconnect call to call for ride and will call back to select appointment  time.  Protocols used: Urination Pain - Female-A-AH

## 2023-09-01 NOTE — Telephone Encounter (Signed)
Appt sch'd for this afternoon

## 2023-09-01 NOTE — Patient Instructions (Signed)
  VISIT SUMMARY: During your visit, we discussed your urinary symptoms, bladder prolapse, constipation, and numbness in your extremities. We reviewed your current medications and discussed potential next steps for each of your health concerns.  YOUR PLAN: -URINARY TRACT INFECTION (UTI): A UTI is an infection in any part of your urinary system. We suspect you might have a UTI based on your symptoms of frequent urination and pelvic pressure. We are waiting for the results of your urine culture to confirm the diagnosis. If the culture confirms a UTI, we will start you on antibiotics for 5 days, to be taken twice daily. If your symptoms worsen, please let us  know immediately.  -BLADDER PROLAPSE: Bladder prolapse occurs when the bladder drops from its normal position and pushes against the vaginal walls. This can cause urinary symptoms and increase the risk of UTIs. We discussed the possibility of re-inserting a pessary or consulting with a urologist or urogynecologist for further management. We will coordinate with your gynecologist to evaluate the best course of action.  -PARESTHESIA OF UPPER AND LOWER EXTREMITIES: Paresthesia refers to numbness or tingling in the extremities. You have been experiencing numbness in your hands and legs, especially in the leg with a history of hip replacement. Stretching has been helping to alleviate some of the discomfort. We will review your previous physical therapy records and consider referring you to physical therapy to help improve your mobility and address the numbness.  -CONSTIPATION: Constipation is when you have infrequent or difficult bowel movements. You have been experiencing chronic constipation but had a loose bowel movement today after several days without one. We will continue to monitor your condition and adjust your treatment as necessary.  INSTRUCTIONS: Please await the results of your urine culture. If the culture confirms a UTI, start the prescribed  antibiotics for 5 days, taking them twice daily. If your symptoms worsen or if you have any concerns, contact us  immediately. We will also coordinate with your gynecologist regarding the management of your bladder prolapse and consider a referral to a urogynecologist if needed. Additionally, we will review your previous physical therapy records and may refer you to physical therapy to help with your numbness and mobility issues.                      Contains text generated by [employer].                                 Contains text generated by [employer].

## 2023-09-01 NOTE — Progress Notes (Signed)
 Patient ID: Gina [name], female    DOB: 12-11-1926, 89 y.o.   MRN: [id number]  PCP: Gina Mole, PA-C  Chief Complaint  Patient presents with   Urinary Frequency    X1 week. Pressure, but no dysuria.    Subjective:   Gina [name] is a 89 y.o. female, presents to clinic with CC of the following:  HPI  Discussed the use of AI scribe software for clinical note transcription with the patient, who gave verbal consent to proceed.  History of Present Illness Gina [name] is a 89 year old female with bladder prolapse who presents with urinary symptoms.  Lower urinary tract symptoms - Frequent urination for the past week - Pressure sensation in the lower abdomen, more pronounced than pain - Occasional burning sensation with urination - No pain, fever, or changes in appetite - No prior history of urinary tract infection - No back or kidney pain  Bladder prolapse - History of bladder prolapse - Previously used a pessary, removed about 1 month ago due to bleeding and irritation - per GYN   Bowel habits and constipation - Regular constipation - Loose bowel movement today after several days without a bowel movement and after use of miralax - Normal bowel movements otherwise despite occasional constipation  Peripheral neuropathy and mobility difficulties - Numbness in legs and hands, associated with prior hip replacement surgery - Stretching alleviates some discomfort - Interested in physical therapy to assist with walking difficulties  Medication tolerance and allergies - Takes Spiriva - No known allergies to antibiotics  Reviewed UA/dip results today unremarkable  Results for orders placed or performed in visit on 09/01/23  POCT urinalysis dipstick   Collection Time: 09/01/23  1:07 PM  Result Value Ref Range   Color, UA Yellow    Clarity, UA Clear    Glucose, UA Negative Negative   Bilirubin, UA Negative    Ketones, UA Negative    Spec Grav, UA 1.010 1.010 - 1.025    Blood, UA Negative    pH, UA 6.0 5.0 - 8.0   Protein, UA Negative Negative   Urobilinogen, UA 0.2 0.2 or 1.0 E.U./dL   Nitrite, UA Negative    Leukocytes, UA Negative Negative   Appearance Normal    Odor None      Patient Active Problem List   Diagnosis Date Noted   Type 2 diabetes mellitus without complication, without long-term current use of insulin (HCC) 12/16/2022   Hyponatremia 09/09/2022   Prediabetes 06/20/2021   Paresthesia of upper and lower extremities of both sides 03/20/2020   Requires assistance with activities of daily living (ADL) 03/20/2020   Mixed hyperlipidemia 03/19/2020   Anxiety with depression 03/19/2020   Degenerative disc disease, cervical 05/10/2019   De Quervain's tenosynovitis, left 05/10/2019   Bilateral hand numbness 05/10/2019   DNR (do not resuscitate) 04/22/2018   Venous stasis 04/21/2018   Cystocele with prolapse 04/01/2017   Bertrum syndrome 06/30/2016   Essential thrombocytosis (HCC) 07/23/2015   Mild atherosclerosis of carotid artery 06/25/2015   Dyslipidemia 06/03/2015   Diverticulosis 05/28/2015   GERD without esophagitis 02/06/2015   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) 01/31/2015   Cervical radiculopathy 01/31/2015   Stage 3b chronic kidney disease (HCC) 10/06/2014   Renal cyst 10/06/2014   Cervical radiculopathy due to degenerative joint disease of spine 10/06/2014   History of right hip replacement 10/06/2014   Hypothyroidism 10/05/2014   Essential hypertension 09/13/2014      Current Outpatient Medications:  Acetaminophen  500 MG coapsule, Take by mouth every 4 (four) hours as needed for fever., Disp: , Rfl:    amLODipine  (NORVASC ) 5 MG tablet, Take 1 tablet (5 mg total) by mouth daily., Disp: 90 tablet, Rfl: 1   aspirin  EC 81 MG tablet, Take 81 mg by mouth daily. , Disp: , Rfl:    atorvastatin  (LIPITOR) 20 MG tablet, Take 1 tablet (20 mg total) by mouth at bedtime., Disp: 90 tablet, Rfl: 3   cephALEXin  (KEFLEX ) 500 MG capsule,  Take 1 capsule (500 mg total) by mouth 2 (two) times daily., Disp: 10 capsule, Rfl: 0   Cholecalciferol (VITAMIN D3) 1000 units CAPS, Take by mouth daily. , Disp: , Rfl:    cyanocobalamin  (VITAMIN B12) 1000 MCG tablet, Take 1,000 mcg by mouth daily., Disp: , Rfl:    famotidine  (PEPCID ) 20 MG tablet, Take 1 tablet (20 mg total) by mouth 2 (two) times daily as needed for heartburn or indigestion (for refractory nausea, indigestion or reflux)., Disp: 60 tablet, Rfl: 1   fluticasone  (FLONASE ) 50 MCG/ACT nasal spray, Place 2 sprays into both nostrils daily., Disp: 16 g, Rfl: 2   hydroxyurea  (HYDREA ) 500 MG capsule, TAKE 1 CAPSULE BY MOUTH 4 DAYS A WEEK, Disp: 60 capsule, Rfl: 0   irbesartan  (AVAPRO ) 150 MG tablet, Take 1 tablet (150 mg total) by mouth daily., Disp: 90 tablet, Rfl: 1   ketoconazole  (NIZORAL ) 2 % shampoo, Apply 1 Application topically 3 (three) times a week. Wash scalp 3 times weekly, let sit 5 minutes and rinse out, Disp: 120 mL, Rfl: 11   levothyroxine  (SYNTHROID ) 88 MCG tablet, TAKE 1 TABLET BY MOUTH ONCE DAILY BEFORE BREAKFAST, Disp: 90 tablet, Rfl: 3   loratadine  (CLARITIN ) 10 MG tablet, Take 1 tablet (10 mg total) by mouth at bedtime as needed for rhinitis (sinus/nasal symptoms)., Disp: 30 tablet, Rfl: 1   mupirocin  ointment (BACTROBAN ) 2 %, Apply to affected area on back with bandage changes until healed., Disp: 22 g, Rfl: 0   nystatin  cream (MYCOSTATIN ), APPLY 1 APPLICATION TOPICALLY TWICE DAILY, Disp: 30 g, Rfl: 0   predniSONE  (STERAPRED UNI-PAK 21 TAB) 10 MG (21) TBPK tablet, Use as directed. (Patient not taking: Reported on 07/13/2023), Disp: 21 each, Rfl: 0   Allergies  Allergen Reactions   Novocain [Procaine] Palpitations     Social History   Tobacco Use   Smoking status: Former   Smokeless tobacco: Never  Vaping Use   Vaping status: Never Used  Substance Use Topics   Alcohol use: No    Alcohol/week: 0.0 standard drinks of alcohol   Drug use: No      Chart  Review Today:  I personally reviewed active problem list, medication list, allergies, family history, social history, health maintenance, notes from last encounter, lab results, imaging with the patient/caregiver today.   Review of Systems  Constitutional: Negative.   HENT: Negative.    Eyes: Negative.   Respiratory: Negative.    Cardiovascular: Negative.   Gastrointestinal: Negative.   Endocrine: Negative.   Genitourinary: Negative.   Musculoskeletal: Negative.   Skin: Negative.   Allergic/Immunologic: Negative.   Neurological: Negative.   Hematological: Negative.   Psychiatric/Behavioral: Negative.    All other systems reviewed and are negative.      Objective:   Vitals:   09/01/23 1252  BP: 130/72  Pulse: 94  Resp: 16  Temp: 97.6 F (36.4 C)  SpO2: 96%  Weight: 146 lb (66.2 kg)  Height: 5' 4 (1.626 m)  Body mass index is 25.06 kg/m.  Physical Exam Vitals and nursing note reviewed.  Constitutional:      General: She is not in acute distress.    Appearance: Normal appearance. She is well-developed. She is not ill-appearing, toxic-appearing or diaphoretic.  HENT:     Head: Normocephalic and atraumatic.     Right Ear: External ear normal.     Left Ear: External ear normal.     Nose: Nose normal.  Eyes:     General: No scleral icterus.       Right eye: No discharge.        Left eye: No discharge.     Conjunctiva/sclera: Conjunctivae normal.  Neck:     Trachea: No tracheal deviation.  Cardiovascular:     Rate and Rhythm: Normal rate and regular rhythm.     Pulses: Normal pulses.     Heart sounds: Normal heart sounds.  Pulmonary:     Effort: Pulmonary effort is normal. No respiratory distress.     Breath sounds: Normal breath sounds. No stridor. No wheezing, rhonchi or rales.  Abdominal:     General: Bowel sounds are normal. There is no distension.     Palpations: Abdomen is soft. There is no mass.     Tenderness: There is abdominal tenderness (mild  suprapubic ttp). There is no right CVA tenderness, left CVA tenderness, guarding or rebound.  Skin:    General: Skin is warm and dry.     Findings: No rash.  Neurological:     Mental Status: She is alert.     Motor: No abnormal muscle tone.     Coordination: Coordination normal.     Gait: Gait abnormal.  Psychiatric:        Mood and Affect: Mood normal.        Behavior: Behavior normal.      Results for orders placed or performed in visit on 09/01/23  POCT urinalysis dipstick   Collection Time: 09/01/23  1:07 PM  Result Value Ref Range   Color, UA Yellow    Clarity, UA Clear    Glucose, UA Negative Negative   Bilirubin, UA Negative    Ketones, UA Negative    Spec Grav, UA 1.010 1.010 - 1.025   Blood, UA Negative    pH, UA 6.0 5.0 - 8.0   Protein, UA Negative Negative   Urobilinogen, UA 0.2 0.2 or 1.0 E.U./dL   Nitrite, UA Negative    Leukocytes, UA Negative Negative   Appearance Normal    Odor None        Assessment & Plan:   1. Urinary frequency (Primary) See below - Urine Culture - POCT urinalysis dipstick  2. Cystocele with prolapse Managed by GYN - see below plan to coordinate with GYN or option to consult urogyn  3. Acute cystitis without hematuria - cephALEXin  (KEFLEX ) 500 MG capsule; Take 1 capsule (500 mg total) by mouth 2 (two) times daily.  Dispense: 10 capsule; Refill: 0  4. Paresthesia of upper and lower extremities of both sides Chronic, unchanged she states affecting mobility, wants to restart PT - Ambulatory referral to Physical Therapy  5. Impaired functional mobility, balance, gait, and endurance See below - Ambulatory referral to Physical Therapy  6. Slow transit constipation Chronic, unchanged,  continue management with diet/fluids and miralax   Assessment and Plan Assessment & Plan Urinary Tract Infection (UTI) Symptoms suggestive of UTI, including pelvic pressure, frequent urination, and possible dysuria, present for one week.  Bladder  prolapse and discontinued pessary use due to irritation and bleeding may contribute to symptoms. Urine dip test inconclusive; urine culture pending. No history of UTIs and not in severe pain, allowing for a wait-and-see approach. Prefers to start antibiotics and we will follow culture - Tx empirically with keflex  500 mg BID x 3 to 5 days - Prescribe antibiotics for 5 days, twice daily, if UTI confirmed. - Coordinate with gynecologist and consider urogynecologist referral if symptoms persist.  Bladder Prolapse Bladder prolapse may contribute to urinary symptoms and increased UTI risk due to incomplete bladder emptying. Discussed potential need to revisit pessary use if UTIs become frequent and option to consult with a urologist or urogynecologist for further management. - Coordinate with gynecologist to evaluate pessary re-insertion or alternative management. - Consider urogynecologist referral for further evaluation and management.  Paresthesia of upper and lower extremities Reports numbness in hands and legs, more pronounced in leg with hip replacement history. Stretching helps alleviate symptoms. Discussed possibility of physical therapy to address symptoms and improve mobility. - Review previous physical therapy records for appropriate referral. - Consider physical therapy referral to address numbness and improve mobility.  Constipation Chronic constipation requiring Spiriva. Experienced loose bowel movement today after several days without bowel movement.  Recording duration: 11 minutes      Michelene Cower, PA-C 09/01/23 2:42 PM

## 2023-09-02 LAB — URINE CULTURE
MICRO NUMBER:: 16677578
SPECIMEN QUALITY:: ADEQUATE

## 2023-09-03 ENCOUNTER — Ambulatory Visit: Payer: Self-pay | Admitting: Family Medicine

## 2023-09-08 ENCOUNTER — Encounter: Payer: Self-pay | Admitting: Obstetrics and Gynecology

## 2023-09-08 ENCOUNTER — Ambulatory Visit: Admitting: Obstetrics and Gynecology

## 2023-09-08 VITALS — BP 151/72 | HR 86 | Ht 64.0 in | Wt 151.0 lb

## 2023-09-08 DIAGNOSIS — N814 Uterovaginal prolapse, unspecified: Secondary | ICD-10-CM

## 2023-09-08 DIAGNOSIS — Z4689 Encounter for fitting and adjustment of other specified devices: Secondary | ICD-10-CM | POA: Diagnosis not present

## 2023-09-08 MED ORDER — TRIMO-SAN 0.025 % VA GEL: VAGINAL | 2 refills | Status: AC

## 2023-09-08 NOTE — Progress Notes (Signed)
 HPI:      [name] is a 89 y.o. [phone] who LMP was No LMP recorded. Patient is postmenopausal.  Subjective:   She presents today because she has been having pelvic pressure symptoms and urinary urgency that she did not have before when she had a pessary in place.  She has brought her pessary with her and wants it replaced.  She also states that she occasionally gets irritation from wearing pads daily.  She reports that she does not leak or have vaginal discharge so she is not sure why she has to wear pads. She has some nystatin  cream that she brought with her and says she uses this for yeast infections but she does not think the irritation is from a yeast infection    Hx: The following portions of the patient's history were reviewed and updated as appropriate:             She  has a past medical history of Breast mass in female, Cataract, Difficult intubation (03/03/2016), Diverticulitis, Diverticulosis (05/28/2015), Essential thrombocytosis (HCC) (07/23/2015), Genital prolapse, GERD (gastroesophageal reflux disease), Bertrum syndrome (06/30/2016), Hemorrhoids (07/29/2016), Hyperlipemia, Hyperlipidemia, Hypothyroidism, Mild atherosclerosis of carotid artery (06/25/2015), Morton's neuroma, MRI contraindicated due to metal implant, Prolapse of uterus, Radiculopathy, Renal cyst, Rosacea, Shingles, Skin cancer, and Thrombocytosis. She does not have any pertinent problems on file. She  has a past surgical history that includes Tonsillectomy; Appendectomy; Cesarean section; Joint replacement (Right); Thyroidectomy; Colon surgery; Cataract extraction w/PHACO (Left, 03/25/2015); Adenoidectomy; biopsy, right breast ; Total hip arthroplasty; Thyroid  lobectomy; and Upper gi endoscopy (10/17/2008). Her family history includes Dementia in her maternal grandfather; Stroke in her father and mother. She  reports that she has quit smoking. She has never used smokeless tobacco. She reports that she does not  drink alcohol and does not use drugs. She has a current medication list which includes the following prescription(s): acetaminophen , amlodipine , aspirin  ec, atorvastatin , cephalexin , vitamin d3, cyanocobalamin , famotidine , fluticasone , hydroxyurea , irbesartan , ketoconazole , levothyroxine , loratadine , mupirocin  ointment, nystatin  cream, and trimo-san. She is allergic to novocain [procaine].       Review of Systems:  Review of Systems  Constitutional: Denied constitutional symptoms, night sweats, recent illness, fatigue, fever, insomnia and weight loss.  Eyes: Denied eye symptoms, eye pain, photophobia, vision change and visual disturbance.  Ears/Nose/Throat/Neck: Denied ear, nose, throat or neck symptoms, hearing loss, nasal discharge, sinus congestion and sore throat.  Cardiovascular: Denied cardiovascular symptoms, arrhythmia, chest pain/pressure, edema, exercise intolerance, orthopnea and palpitations.  Respiratory: Denied pulmonary symptoms, asthma, pleuritic pain, productive sputum, cough, dyspnea and wheezing.  Gastrointestinal: Denied, gastro-esophageal reflux, melena, nausea and vomiting.  Genitourinary: Denied genitourinary symptoms including symptomatic vaginal discharge, pelvic relaxation issues, and urinary complaints.  Musculoskeletal: Denied musculoskeletal symptoms, stiffness, swelling, muscle weakness and myalgia.  Dermatologic: Denied dermatology symptoms, rash and scar.  Neurologic: Denied neurology symptoms, dizziness, headache, neck pain and syncope.  Psychiatric: Denied psychiatric symptoms, anxiety and depression.  Endocrine: Denied endocrine symptoms including hot flashes and night sweats.   Meds:   Current Outpatient Medications on File Prior to Visit  Medication Sig Dispense Refill   Acetaminophen  500 MG coapsule Take by mouth every 4 (four) hours as needed for fever.     amLODipine  (NORVASC ) 5 MG tablet Take 1 tablet (5 mg total) by mouth daily. 90 tablet 1    aspirin  EC 81 MG tablet Take 81 mg by mouth daily.      atorvastatin  (LIPITOR) 20 MG tablet Take 1 tablet (20 mg total)  by mouth at bedtime. 90 tablet 3   cephALEXin  (KEFLEX ) 500 MG capsule Take 1 capsule (500 mg total) by mouth 2 (two) times daily. 10 capsule 0   Cholecalciferol (VITAMIN D3) 1000 units CAPS Take by mouth daily.      cyanocobalamin  (VITAMIN B12) 1000 MCG tablet Take 1,000 mcg by mouth daily.     famotidine  (PEPCID ) 20 MG tablet Take 1 tablet (20 mg total) by mouth 2 (two) times daily as needed for heartburn or indigestion (for refractory nausea, indigestion or reflux). 60 tablet 1   fluticasone  (FLONASE ) 50 MCG/ACT nasal spray Place 2 sprays into both nostrils daily. 16 g 2   hydroxyurea  (HYDREA ) 500 MG capsule TAKE 1 CAPSULE BY MOUTH 4 DAYS A WEEK 60 capsule 0   irbesartan  (AVAPRO ) 150 MG tablet Take 1 tablet (150 mg total) by mouth daily. 90 tablet 1   ketoconazole  (NIZORAL ) 2 % shampoo Apply 1 Application topically 3 (three) times a week. Wash scalp 3 times weekly, let sit 5 minutes and rinse out 120 mL 11   levothyroxine  (SYNTHROID ) 88 MCG tablet TAKE 1 TABLET BY MOUTH ONCE DAILY BEFORE BREAKFAST 90 tablet 3   loratadine  (CLARITIN ) 10 MG tablet Take 1 tablet (10 mg total) by mouth at bedtime as needed for rhinitis (sinus/nasal symptoms). 30 tablet 1   mupirocin  ointment (BACTROBAN ) 2 % Apply to affected area on back with bandage changes until healed. 22 g 0   nystatin  cream (MYCOSTATIN ) APPLY 1 APPLICATION TOPICALLY TWICE DAILY 30 g 0   No current facility-administered medications on file prior to visit.      Objective:     Vitals:   09/08/23 1307  BP: (!) 151/72  Pulse: 86   Filed Weights   09/08/23 1307  Weight: 151 lb (68.5 kg)              Pessary Care Pessary cleaned.  Pessary replaced. No evidence of vulvar monilia or lichen sclerosus present           Assessment:    [id number] Patient Active Problem List   Diagnosis Date Noted   Type 2 diabetes  mellitus without complication, without long-term current use of insulin (HCC) 12/16/2022   Hyponatremia 09/09/2022   Prediabetes 06/20/2021   Paresthesia of upper and lower extremities of both sides 03/20/2020   Requires assistance with activities of daily living (ADL) 03/20/2020   Mixed hyperlipidemia 03/19/2020   Anxiety with depression 03/19/2020   Degenerative disc disease, cervical 05/10/2019   De Quervain's tenosynovitis, left 05/10/2019   Bilateral hand numbness 05/10/2019   DNR (do not resuscitate) 04/22/2018   Venous stasis 04/21/2018   Cystocele with prolapse 04/01/2017   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 Pessary maintenance   2. Cystocele with prolapse        Plan:            1.  Pessary replaced.  Use of Trimo-San cream discussed.  She will see if her symptoms of pelvic pressure and  urinary frequency resolved with the pessary.  She will try not wearing pads for a while and see if she has any increased discharge that is a problem as well as checking to see if the irritation resolves. Plan 39-month follow-up. Orders No orders of the defined types were placed in this encounter.    Meds ordered this encounter  Medications   OXYQUINOLONE SULFATE VAGINAL (TRIMO-SAN) 0.025 % GEL    Sig: INSERT 1/3 APPLICATORFUL VAGINALLY TWICE WEEKLY.    Dispense:  113 g    Refill:  2      F/U  No follow-ups on file.  Alm DOROTHA Sar, M.D. 09/08/2023 1:48  PM

## 2023-09-08 NOTE — Progress Notes (Signed)
 Patient presents for pessary maintenance. She states noticing more pressure lately since the pessary was removed last time and now she wants it reinserted. Denies bleeding and urine loss, does report skin irritation from long term use of pads. Patient states no other questions or concerns at this time.

## 2023-09-16 DIAGNOSIS — K080 Exfoliation of teeth due to systemic causes: Secondary | ICD-10-CM | POA: Diagnosis not present

## 2023-10-26 ENCOUNTER — Telehealth: Payer: Self-pay

## 2023-10-26 NOTE — Telephone Encounter (Signed)
Called and lvm informing patient

## 2023-10-26 NOTE — Telephone Encounter (Signed)
 Copied from CRM #[id number]. Topic: Clinical - Medical Advice >> Oct 26, 2023 10:29 AM Delon DASEN wrote: Reason for CRM: Facility is giving the Covid and RSV injections, wants to know which one she needs and if she should do both one at a time and which one to do first- [phone]

## 2023-10-27 DIAGNOSIS — N281 Cyst of kidney, acquired: Secondary | ICD-10-CM | POA: Diagnosis not present

## 2023-10-27 DIAGNOSIS — E871 Hypo-osmolality and hyponatremia: Secondary | ICD-10-CM | POA: Diagnosis not present

## 2023-10-27 DIAGNOSIS — N1831 Chronic kidney disease, stage 3a: Secondary | ICD-10-CM | POA: Diagnosis not present

## 2023-10-27 DIAGNOSIS — I129 Hypertensive chronic kidney disease with stage 1 through stage 4 chronic kidney disease, or unspecified chronic kidney disease: Secondary | ICD-10-CM | POA: Diagnosis not present

## 2023-10-29 ENCOUNTER — Inpatient Hospital Stay: Admitting: Oncology

## 2023-10-29 ENCOUNTER — Encounter: Payer: Self-pay | Admitting: Oncology

## 2023-10-29 ENCOUNTER — Inpatient Hospital Stay: Attending: Oncology

## 2023-10-29 VITALS — BP 128/62 | HR 78 | Temp 97.6°F | Resp 18 | Ht 64.0 in | Wt 148.0 lb

## 2023-10-29 DIAGNOSIS — D473 Essential (hemorrhagic) thrombocythemia: Secondary | ICD-10-CM | POA: Diagnosis not present

## 2023-10-29 DIAGNOSIS — N289 Disorder of kidney and ureter, unspecified: Secondary | ICD-10-CM | POA: Diagnosis not present

## 2023-10-29 DIAGNOSIS — Z79899 Other long term (current) drug therapy: Secondary | ICD-10-CM | POA: Insufficient documentation

## 2023-10-29 DIAGNOSIS — Z87891 Personal history of nicotine dependence: Secondary | ICD-10-CM | POA: Diagnosis not present

## 2023-10-29 DIAGNOSIS — E871 Hypo-osmolality and hyponatremia: Secondary | ICD-10-CM | POA: Diagnosis not present

## 2023-10-29 LAB — CBC WITH DIFFERENTIAL/PLATELET
Abs Immature Granulocytes: 0.07 K/uL (ref 0.00–0.07)
Basophils Absolute: 0.1 K/uL (ref 0.0–0.1)
Basophils Relative: 1 %
Eosinophils Absolute: 0.1 K/uL (ref 0.0–0.5)
Eosinophils Relative: 1 %
HCT: 43.9 % (ref 36.0–46.0)
Hemoglobin: 14.7 g/dL (ref 12.0–15.0)
Immature Granulocytes: 1 %
Lymphocytes Relative: 13 %
Lymphs Abs: 1.3 K/uL (ref 0.7–4.0)
MCH: 31.4 pg (ref 26.0–34.0)
MCHC: 33.5 g/dL (ref 30.0–36.0)
MCV: 93.8 fL (ref 80.0–100.0)
Monocytes Absolute: 1 K/uL (ref 0.1–1.0)
Monocytes Relative: 10 %
Neutro Abs: 7.7 K/uL (ref 1.7–7.7)
Neutrophils Relative %: 74 %
Platelets: 514 K/uL — ABNORMAL HIGH (ref 150–400)
RBC: 4.68 MIL/uL (ref 3.87–5.11)
RDW: 14.2 % (ref 11.5–15.5)
WBC: 10.3 K/uL (ref 4.0–10.5)
nRBC: 0 % (ref 0.0–0.2)

## 2023-10-29 LAB — CMP (CANCER CENTER ONLY)
ALT: 15 U/L (ref 0–44)
AST: 21 U/L (ref 15–41)
Albumin: 4.5 g/dL (ref 3.5–5.0)
Alkaline Phosphatase: 76 U/L (ref 38–126)
Anion gap: 10 (ref 5–15)
BUN: 29 mg/dL — ABNORMAL HIGH (ref 8–23)
CO2: 23 mmol/L (ref 22–32)
Calcium: 9.6 mg/dL (ref 8.9–10.3)
Chloride: 99 mmol/L (ref 98–111)
Creatinine: 1.21 mg/dL — ABNORMAL HIGH (ref 0.44–1.00)
GFR, Estimated: 41 mL/min — ABNORMAL LOW (ref >60)
Glucose, Bld: 93 mg/dL (ref 70–99)
Potassium: 4.6 mmol/L (ref 3.5–5.1)
Sodium: 132 mmol/L — ABNORMAL LOW (ref 135–145)
Total Bilirubin: 1.7 mg/dL — ABNORMAL HIGH (ref 0.0–1.2)
Total Protein: 7.8 g/dL (ref 6.5–8.1)

## 2023-10-29 NOTE — Progress Notes (Signed)
 [facility]  Telephone:([phone]) [phone] [phone]) [phone]  ID: Gina [name] OB: Feb 28, 1926  MR#: [id number]  RDW#:[id number]  Patient Care Team: Leavy Mole, PA-C as PCP - General (Family Medicine) Dennise Capri, MD (Internal Medicine) Arloa Lamar SQUIBB, MD as Referring Physician (Obstetrics and Gynecology) Lenn Standing, MD (Ophthalmology) Lane Arthea BRAVO, MD as Referring Physician (Neurology) Herminio Miu, MD (Otolaryngology) Jackquline Sawyer, MD (Dermatology) Jacobo Evalene PARAS, MD as Consulting Physician (Oncology)  CHIEF COMPLAINT: Essential thrombocytosis  INTERVAL HISTORY: Patient returns to clinic today for repeat laboratory work and routine 23-month evaluation.  She continues to feel well and remains asymptomatic.  She is tolerating Hydrea  without significant side effects. She has no neurologic complaints.  She denies any recent fevers or illnesses.  She has a good appetite and denies weight loss.  She denies any chest pain, shortness of breath, cough, or hemoptysis.  She denies any nausea, vomiting, constipation, or diarrhea.  She has no urinary complaints.  Patient offers no specific complaints today.  REVIEW OF SYSTEMS:   Review of Systems  Constitutional: Negative.  Negative for fever, malaise/fatigue and weight loss.  Respiratory: Negative.  Negative for cough, hemoptysis and shortness of breath.   Cardiovascular: Negative.  Negative for chest pain and leg swelling.  Gastrointestinal: Negative.  Negative for abdominal pain.  Genitourinary: Negative.  Negative for dysuria.  Musculoskeletal: Negative.  Negative for back pain.  Skin: Negative.  Negative for rash.  Neurological: Negative.  Negative for focal weakness, weakness and headaches.  Psychiatric/Behavioral: Negative.  The patient is not nervous/anxious.     As per HPI. Otherwise, a complete review of systems is negative.  PAST MEDICAL HISTORY: Past Medical History:  Diagnosis Date    Breast mass in female    right breast   Cataract    Difficult intubation 03/03/2016   September 06, 2013; left nasal fiberoptic intubation #7 ETT; see letter from Dr. 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 Rudell   Hemorrhoids 07/29/2016   Hyperlipemia    Hyperlipidemia    history of    Hypothyroidism    Mild atherosclerosis of carotid artery 06/25/2015   Morton's neuroma    MRI contraindicated due to metal implant    Prolapse of uterus    Radiculopathy    Renal cyst    Rosacea    Shingles    Skin cancer    R paranasal, Mohs   Thrombocytosis     PAST SURGICAL HISTORY: Past Surgical History:  Procedure Laterality Date   ADENOIDECTOMY     APPENDECTOMY     biopsy, right breast      CATARACT EXTRACTION W/PHACO Left 03/25/2015   Procedure: CATARACT EXTRACTION PHACO AND INTRAOCULAR LENS PLACEMENT (IOC);  Surgeon: Steven Dingeldein, MD;  Location: [facility] ORS;  Service: Ophthalmology;  Laterality: Left;  US  02:09 AP% 27.3 CDE 59.97 fluid pack lot #[id number]   CESAREAN SECTION     COLON SURGERY     JOINT REPLACEMENT Right    total hip   THYROID  LOBECTOMY     THYROIDECTOMY     partial   TONSILLECTOMY     TOTAL HIP ARTHROPLASTY     UPPER GI ENDOSCOPY  10/17/2008    FAMILY HISTORY: Family History  Problem Relation Age of Onset   Stroke Mother  Stroke Father    Dementia Maternal Grandfather     ADVANCED DIRECTIVES (Y/N):  N  HEALTH MAINTENANCE: Social History   Tobacco Use   Smoking status: Former   Smokeless tobacco: Never  Advertising account planner   Vaping status: Never Used  Substance Use Topics   Alcohol use: No    Alcohol/week: 0.0 standard drinks of alcohol   Drug use: No     Colonoscopy:  PAP:  Bone density:  Lipid panel:  Allergies  Allergen Reactions    Novocain [Procaine] Palpitations    Current Outpatient Medications  Medication Sig Dispense Refill   Acetaminophen  500 MG coapsule Take by mouth every 4 (four) hours as needed for fever.     amLODipine  (NORVASC ) 5 MG tablet Take 1 tablet (5 mg total) by mouth daily. 90 tablet 1   aspirin  EC 81 MG tablet Take 81 mg by mouth daily.      atorvastatin  (LIPITOR) 20 MG tablet Take 1 tablet (20 mg total) by mouth at bedtime. 90 tablet 3   cephALEXin  (KEFLEX ) 500 MG capsule Take 1 capsule (500 mg total) by mouth 2 (two) times daily. 10 capsule 0   Cholecalciferol (VITAMIN D3) 1000 units CAPS Take by mouth daily.      cyanocobalamin  (VITAMIN B12) 1000 MCG tablet Take 1,000 mcg by mouth daily.     famotidine  (PEPCID ) 20 MG tablet Take 1 tablet (20 mg total) by mouth 2 (two) times daily as needed for heartburn or indigestion (for refractory nausea, indigestion or reflux). 60 tablet 1   fluticasone  (FLONASE ) 50 MCG/ACT nasal spray Place 2 sprays into both nostrils daily. 16 g 2   hydroxyurea  (HYDREA ) 500 MG capsule TAKE 1 CAPSULE BY MOUTH 4 DAYS A WEEK 60 capsule 0   irbesartan  (AVAPRO ) 150 MG tablet Take 1 tablet (150 mg total) by mouth daily. 90 tablet 1   ketoconazole  (NIZORAL ) 2 % shampoo Apply 1 Application topically 3 (three) times a week. Wash scalp 3 times weekly, let sit 5 minutes and rinse out 120 mL 11   levothyroxine  (SYNTHROID ) 88 MCG tablet TAKE 1 TABLET BY MOUTH ONCE DAILY BEFORE BREAKFAST 90 tablet 3   loratadine  (CLARITIN ) 10 MG tablet Take 1 tablet (10 mg total) by mouth at bedtime as needed for rhinitis (sinus/nasal symptoms). 30 tablet 1   mupirocin  ointment (BACTROBAN ) 2 % Apply to affected area on back with bandage changes until healed. 22 g 0   nystatin  cream (MYCOSTATIN ) APPLY 1 APPLICATION TOPICALLY TWICE DAILY 30 g 0   OXYQUINOLONE SULFATE VAGINAL (TRIMO-SAN) 0.025 % GEL INSERT 1/3 APPLICATORFUL VAGINALLY TWICE WEEKLY. 113 g 2   No current facility-administered medications for  this visit.    OBJECTIVE: Vitals:   10/29/23 1016  BP: 128/62  Pulse: 78  Resp: 18  Temp: 97.6 F (36.4 C)  SpO2: 99%     Body mass index is 25.4 kg/m.    ECOG FS:0 - Asymptomatic  General: Well-developed, well-nourished, no acute distress. Eyes: Pink conjunctiva, anicteric sclera. HEENT: Normocephalic, moist mucous membranes. Lungs: No audible wheezing or coughing. Heart: Regular rate and rhythm. Abdomen: Soft, nontender, no obvious distention. Musculoskeletal: No edema, cyanosis, or clubbing. Neuro: Alert, answering all questions appropriately. Cranial nerves grossly intact. Skin: No rashes or petechiae noted. Psych: Normal affect.  LAB RESULTS:  Lab Results  Component Value Date   NA 132 (L) 10/29/2023   K 4.6 10/29/2023   CL 99 10/29/2023   CO2 23 10/29/2023   GLUCOSE 93 10/29/2023  BUN 29 (H) 10/29/2023   CREATININE 1.21 (H) 10/29/2023   CALCIUM  9.6 10/29/2023   PROT 7.8 10/29/2023   ALBUMIN 4.5 10/29/2023   AST 21 10/29/2023   ALT 15 10/29/2023   ALKPHOS 76 10/29/2023   BILITOT 1.7 (H) 10/29/2023   GFRNONAA 41 (L) 10/29/2023   GFRAA 43 (L) 08/12/2020    Lab Results  Component Value Date   WBC 10.3 10/29/2023   NEUTROABS 7.7 10/29/2023   HGB 14.7 10/29/2023   HCT 43.9 10/29/2023   MCV 93.8 10/29/2023   PLT 514 (H) 10/29/2023     STUDIES: No results found.  ASSESSMENT: Essential thrombocytosis  PLAN:   Essential thrombocytosis: Chronic and unchanged.  Patient's platelet count is mildly elevated, but essentially unchanged at 514.  Her platelet count has ranged between 390-595 since at least July 2020.  Continue Hydrea  4 days a week on Monday, Wednesday, Friday, and Saturday. Continue 81 mg of aspirin .  No further interventions are needed.  Return to clinic in 6 months with repeat laboratory and further evaluation.   Renal insufficiency: Patient's GFR is trended down slightly to 41.   Hyponatremia: Chronic and unchanged.  Patient's most recent  sodium level is 132.   Patient expressed understanding and was in agreement with this plan. She also understands that She can call clinic at any time with any questions, concerns, or complaints.    Evalene JINNY Reusing, MD   10/29/2023 10:35 AM

## 2023-10-29 NOTE — Progress Notes (Signed)
 Patient is doing well, no new questions for the doctor today.

## 2023-11-02 ENCOUNTER — Telehealth: Payer: Self-pay

## 2023-11-02 DIAGNOSIS — Z23 Encounter for immunization: Secondary | ICD-10-CM

## 2023-11-02 MED ORDER — COVID-19 MRNA VAC-TRIS(PFIZER) 30 MCG/0.3ML IM SUSY: 0.3000 mL | PREFILLED_SYRINGE | Freq: Once | INTRAMUSCULAR | 0 refills | Status: AC

## 2023-11-02 NOTE — Telephone Encounter (Signed)
 Copied from CRM #[id number]. Topic: Clinical - Medication Question >> Nov 02, 2023  9:58 AM Leonette SQUIBB wrote: Reason for CRM: Verneita with [facility] called asking if they  can get a script for the Covid vaccine because they are having a flu clinic tomorrow.  CB [phone] ask for [facility] Fax [phone]

## 2023-12-16 ENCOUNTER — Ambulatory Visit: Admitting: Family Medicine

## 2023-12-16 ENCOUNTER — Ambulatory Visit: Admitting: Nurse Practitioner

## 2023-12-23 ENCOUNTER — Other Ambulatory Visit: Payer: Self-pay | Admitting: Oncology

## 2023-12-23 DIAGNOSIS — D75839 Thrombocytosis, unspecified: Secondary | ICD-10-CM

## 2023-12-23 DIAGNOSIS — D473 Essential (hemorrhagic) thrombocythemia: Secondary | ICD-10-CM

## 2023-12-29 ENCOUNTER — Ambulatory Visit: Admitting: Family Medicine

## 2024-01-05 ENCOUNTER — Ambulatory Visit: Admitting: Internal Medicine

## 2024-01-10 ENCOUNTER — Ambulatory Visit: Admitting: Internal Medicine

## 2024-01-10 ENCOUNTER — Encounter: Payer: Self-pay | Admitting: Internal Medicine

## 2024-01-10 ENCOUNTER — Other Ambulatory Visit: Payer: Self-pay

## 2024-01-10 VITALS — BP 118/72 | HR 90 | Resp 16 | Ht 64.0 in | Wt 149.3 lb

## 2024-01-10 DIAGNOSIS — I10 Essential (primary) hypertension: Secondary | ICD-10-CM

## 2024-01-10 DIAGNOSIS — E1122 Type 2 diabetes mellitus with diabetic chronic kidney disease: Secondary | ICD-10-CM

## 2024-01-10 DIAGNOSIS — N1832 Chronic kidney disease, stage 3b: Secondary | ICD-10-CM | POA: Diagnosis not present

## 2024-01-10 DIAGNOSIS — H9202 Otalgia, left ear: Secondary | ICD-10-CM | POA: Diagnosis not present

## 2024-01-10 DIAGNOSIS — E1165 Type 2 diabetes mellitus with hyperglycemia: Secondary | ICD-10-CM

## 2024-01-10 LAB — POCT GLYCOSYLATED HEMOGLOBIN (HGB A1C): Hemoglobin A1C: 5.9 % — AB (ref 4.0–5.6)

## 2024-01-10 MED ORDER — IRBESARTAN 150 MG PO TABS: 150.0000 mg | ORAL_TABLET | Freq: Every day | ORAL | 1 refills | Status: AC

## 2024-01-10 MED ORDER — AMLODIPINE BESYLATE 5 MG PO TABS: 5.0000 mg | ORAL_TABLET | Freq: Every day | ORAL | 1 refills | Status: AC

## 2024-01-10 NOTE — Progress Notes (Signed)
 Established Patient Office Visit  Subjective:  Patient ID: Gina [name], female    DOB: 02/07/1927  Age: 89 y.o. MRN: [id number]  CC:  Chief Complaint  Patient presents with   Medical Management of Chronic Issues    6 month recheck    HPI Gina [name] presents for follow up on chronic medical conditions.   Discussed the use of AI scribe software for clinical note transcription with the patient, who gave verbal consent to proceed.  History of Present Illness Gina [name] is a 89 year old female who presents with intermittent left ear pain and concerns about weight management.  She experiences intermittent left ear pain, described as a 'jab' that comes and goes without any associated pressure sensation. The pain lasts for a day before resolving, and warmth alleviates the discomfort.  She is concerned about belly fat, which she associates with aging. Her weight is 149 pounds, a slight decrease from 151 pounds in July. She actively manages her diet to maintain her blood sugar levels, with a history of prediabetes and a previous A1c of 6.5% in 2024.  She experiences cold hands and feet, describing them as 'ice cold,' and numbness and tingling in her knees. Her thyroid  function was last checked in April 2025, with a result of 2.21.  She resides in an assisted living facility, engages in leg exercises and walking to maintain mobility, and is up to date with COVID-19, flu, pneumonia, and RSV vaccines.   Hypertension: -Medications: Irbesartan  150 mg, Amlodipine  5 mg -Patient is compliant with above medications and reports no side effects. -Denies any SOB, CP, vision changes, LE edema or symptoms of hypotension  HLD: -Medications: Lipitor 20 mg -Patient is compliant with above medications and reports no side effects.  -Last lipid panel: Lipid Panel     Component Value Date/Time   CHOL 174 06/16/2023 1139   CHOL 170 09/02/2015 0859   TRIG 215 (H) 06/16/2023 1139   HDL 52  06/16/2023 1139   HDL 55 09/02/2015 0859   CHOLHDL 3.3 06/16/2023 1139   VLDL 31 (H) 08/27/2016 0816   LDLCALC 91 06/16/2023 1139   LABVLDL 35 09/02/2015 0859   Diabetes: -Last A1c in 6.3% 4/25, had a 6.5% in June 2024 but came down, never been on medications.   Hypothyroidism: -Medications: Levothyroxine  88 mcg  -Patient is compliant with the above medication (s) at the above dose and reports no medication side effects.  -Denies weight changes, skin changes, anxiety/palpitations.  Does have some cold intolerance mildly. -Last TSH: 2.21 4/25  GERD: -Currently on Pepcid  20 mg daily, doing well   CKD3b: -Last Cr 1.05, GFR 49 9/25 -Follows with Nephrology  Essential thrombocytosis: -Following with oncology -Currently on Hydrea  regiment 4 days a week  Health Maintenance: -Blood work UTD -Plan to boost Prevnar 20 next year  Past Medical History:  Diagnosis Date   Breast mass in female    right breast   Cataract    Difficult intubation 03/03/2016   September 06, 2013; left nasal fiberoptic intubation #7 ETT; see letter from Dr. 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 Rudell   Hemorrhoids 07/29/2016   Hyperlipemia    Hyperlipidemia    history of    Hypothyroidism    Mild atherosclerosis of carotid artery 06/25/2015  Morton's neuroma    MRI contraindicated due to metal implant    Prolapse of uterus    Radiculopathy    Renal cyst    Rosacea    Shingles    Skin cancer    R paranasal, Mohs   Thrombocytosis     Past Surgical History:  Procedure Laterality Date   ADENOIDECTOMY     APPENDECTOMY     biopsy, right breast      CATARACT EXTRACTION W/PHACO Left 03/25/2015   Procedure: CATARACT EXTRACTION PHACO AND INTRAOCULAR LENS PLACEMENT (IOC);  Surgeon: Steven  Dingeldein, MD;  Location: [facility] ORS;  Service: Ophthalmology;  Laterality: Left;  US  02:09 AP% 27.3 CDE 59.97 fluid pack lot #[id number]   CESAREAN SECTION     COLON SURGERY     JOINT REPLACEMENT Right    total hip   THYROID  LOBECTOMY     THYROIDECTOMY     partial   TONSILLECTOMY     TOTAL HIP ARTHROPLASTY     UPPER GI ENDOSCOPY  10/17/2008    Family History  Problem Relation Age of Onset   Stroke Mother    Stroke Father    Dementia Maternal Grandfather     Social History   Socioeconomic History   Marital status: Widowed    Spouse name: Not on file   Number of children: 2   Years of education: Not on file   Highest education level: Not on file  Occupational History   Occupation: retired   Tobacco Use   Smoking status: Former   Smokeless tobacco: Never  Vaping Use   Vaping status: Never Used  Substance and Sexual Activity   Alcohol use: No    Alcohol/week: 0.0 standard drinks of alcohol   Drug use: No   Sexual activity: Not Currently    Birth control/protection: Post-menopausal  Other Topics Concern   Not on file  Social History Narrative   Lost her husband May 5 th, 2021   She does not drive   She has two sons that live in town   Social Drivers of Corporate Investment Banker Strain: Not on file  Food Insecurity: No Food Insecurity (09/09/2022)   Hunger Vital Sign    Worried About Running Out of Food in the Last Year: Never true    Ran Out of Food in the Last Year: Never true  Transportation Needs: No Transportation Needs (09/09/2022)   PRAPARE - Administrator, Civil Service (Medical): No    Lack of Transportation (Non-Medical): No  Physical Activity: Not on file  Stress: Not on file  Social Connections: Not on file  Intimate Partner Violence: Not At Risk (09/09/2022)   Humiliation, Afraid, Rape, and Kick questionnaire    Fear of Current or Ex-Partner: No    Emotionally Abused: No    Physically Abused: No    Sexually Abused: No     Outpatient Medications Prior to Visit  Medication Sig Dispense Refill   Acetaminophen  500 MG coapsule Take by mouth every 4 (four) hours as needed for fever.     amLODipine  (NORVASC ) 5 MG tablet Take 1 tablet (5 mg total) by mouth daily. 90 tablet 1   aspirin  EC 81 MG tablet Take 81 mg by mouth daily.      atorvastatin  (LIPITOR) 20 MG tablet Take 1 tablet (20 mg total) by mouth at bedtime. 90 tablet 3   Cholecalciferol (VITAMIN D3) 1000 units CAPS Take by mouth daily.      cyanocobalamin  (  VITAMIN B12) 1000 MCG tablet Take 1,000 mcg by mouth daily.     famotidine  (PEPCID ) 20 MG tablet Take 1 tablet (20 mg total) by mouth 2 (two) times daily as needed for heartburn or indigestion (for refractory nausea, indigestion or reflux). 60 tablet 1   fluticasone  (FLONASE ) 50 MCG/ACT nasal spray Place 2 sprays into both nostrils daily. 16 g 2   hydroxyurea  (HYDREA ) 500 MG capsule TAKE 1 CAPSULE BY MOUTH 4 DAYS A WEEK 60 capsule 0   ketoconazole  (NIZORAL ) 2 % shampoo Apply 1 Application topically 3 (three) times a week. Wash scalp 3 times weekly, let sit 5 minutes and rinse out 120 mL 11   levothyroxine  (SYNTHROID ) 88 MCG tablet TAKE 1 TABLET BY MOUTH ONCE DAILY BEFORE BREAKFAST 90 tablet 3   loratadine  (CLARITIN ) 10 MG tablet Take 1 tablet (10 mg total) by mouth at bedtime as needed for rhinitis (sinus/nasal symptoms). 30 tablet 1   OXYQUINOLONE SULFATE VAGINAL (TRIMO-SAN) 0.025 % GEL INSERT 1/3 APPLICATORFUL VAGINALLY TWICE WEEKLY. 113 g 2   cephALEXin  (KEFLEX ) 500 MG capsule Take 1 capsule (500 mg total) by mouth 2 (two) times daily. (Patient not taking: Reported on 01/10/2024) 10 capsule 0   irbesartan  (AVAPRO ) 150 MG tablet Take 1 tablet (150 mg total) by mouth daily. 90 tablet 1   mupirocin  ointment (BACTROBAN ) 2 % Apply to affected area on back with bandage changes until healed. (Patient not taking: Reported on 01/10/2024) 22 g 0   nystatin  cream (MYCOSTATIN ) APPLY 1 APPLICATION TOPICALLY TWICE  DAILY 30 g 0   No facility-administered medications prior to visit.    Allergies  Allergen Reactions   Novocain [Procaine] Palpitations    ROS Review of Systems  HENT:  Positive for ear pain.       Objective:    Physical Exam Constitutional:      Appearance: Normal appearance.     Comments: Uses walker   HENT:     Head: Normocephalic and atraumatic.     Right Ear: Tympanic membrane, ear canal and external ear normal.     Left Ear: Tympanic membrane, ear canal and external ear normal.  Eyes:     Conjunctiva/sclera: Conjunctivae normal.  Cardiovascular:     Rate and Rhythm: Normal rate and regular rhythm.  Pulmonary:     Effort: Pulmonary effort is normal.     Breath sounds: Normal breath sounds.  Skin:    General: Skin is warm and dry.     Capillary Refill: Capillary refill takes 2 to 3 seconds.  Neurological:     General: No focal deficit present.     Mental Status: She is alert. Mental status is at baseline.  Psychiatric:        Mood and Affect: Mood normal.        Behavior: Behavior normal.     BP 118/72 (Cuff Size: Large)   Pulse 90   Resp 16   Ht 5' 4 (1.626 m)   Wt 149 lb 4.8 oz (67.7 kg)   SpO2 97%   BMI 25.63 kg/m  Wt Readings from Last 3 Encounters:  01/10/24 149 lb 4.8 oz (67.7 kg)  10/29/23 148 lb (67.1 kg)  09/08/23 151 lb (68.5 kg)     Health Maintenance Due  Topic Date Due   Zoster Vaccines- Shingrix (1 of 2) Never done   Medicare Annual Wellness (AWV)  01/05/2018   FOOT EXAM  12/16/2023   HEMOGLOBIN A1C  12/16/2023    There are no  preventive care reminders to display for this patient.  Lab Results  Component Value Date   TSH 2.21 06/16/2023   Lab Results  Component Value Date   WBC 10.3 10/29/2023   HGB 14.7 10/29/2023   HCT 43.9 10/29/2023   MCV 93.8 10/29/2023   PLT 514 (H) 10/29/2023   Lab Results  Component Value Date   NA 132 (L) 10/29/2023   K 4.6 10/29/2023   CO2 23 10/29/2023   GLUCOSE 93 10/29/2023   BUN  29 (H) 10/29/2023   CREATININE 1.21 (H) 10/29/2023   BILITOT 1.7 (H) 10/29/2023   ALKPHOS 76 10/29/2023   AST 21 10/29/2023   ALT 15 10/29/2023   PROT 7.8 10/29/2023   ALBUMIN 4.5 10/29/2023   CALCIUM  9.6 10/29/2023   ANIONGAP 10 10/29/2023   EGFR 48 (L) 06/16/2023   Lab Results  Component Value Date   CHOL 174 06/16/2023   Lab Results  Component Value Date   HDL 52 06/16/2023   Lab Results  Component Value Date   LDLCALC 91 06/16/2023   Lab Results  Component Value Date   TRIG 215 (H) 06/16/2023   Lab Results  Component Value Date   CHOLHDL 3.3 06/16/2023   Lab Results  Component Value Date   HGBA1C 6.3 (H) 06/16/2023      Assessment & Plan:   Assessment & Plan Type 2 diabetes mellitus, currently without complications A1c improved to 5.9, indicating good glycemic control. - Continue current management and lifestyle modifications. - Check A1c every six months.  Essential hypertension Blood pressure well-controlled at 118/72 mmHg. - Refilled blood pressure medications.  Chronic kidney disease, stage 3b Kidney function well-managed with no acute issues.  Intermittent left ear pain, likely referred from neck Intermittent left ear pain, likely referred from neck. No infection or fluid in the ear. Pain possibly muscular in origin. - Apply Bengay or Icy Hot to neck muscles for pain relief. - Perform range of motion exercises for the neck.  Cold hands and feet Cold hands and feet with good blood supply and pulses. Possible carpal tunnel syndrome due to wrist positioning. - Consider using a wrist brace to prevent wrist flexion and alleviate symptoms.  General Health Maintenance Discussed vaccinations and screenings. Pneumonia vaccine due next year. RSV and shingles vaccines up to date. Mammograms discontinued after consultation with other physicians. - Administer pneumonia vaccine next year. - Continue current health maintenance practices.  - POCT HgB A1C -  amLODipine  (NORVASC ) 5 MG tablet; Take 1 tablet (5 mg total) by mouth daily.  Dispense: 90 tablet; Refill: 1 - irbesartan  (AVAPRO ) 150 MG tablet; Take 1 tablet (150 mg total) by mouth daily.  Dispense: 90 tablet; Refill: 1   Follow-up: Return in about 6 months (around 07/09/2024).    Sharyle Fischer, DO

## 2024-01-12 ENCOUNTER — Ambulatory Visit (INDEPENDENT_AMBULATORY_CARE_PROVIDER_SITE_OTHER): Admitting: Obstetrics & Gynecology

## 2024-01-12 VITALS — BP 135/78 | HR 88 | Wt 149.3 lb

## 2024-01-12 DIAGNOSIS — Z4689 Encounter for fitting and adjustment of other specified devices: Secondary | ICD-10-CM | POA: Diagnosis not present

## 2024-01-12 DIAGNOSIS — N814 Uterovaginal prolapse, unspecified: Secondary | ICD-10-CM

## 2024-01-12 NOTE — Progress Notes (Signed)
    GYNECOLOGY PROGRESS NOTE  Subjective:    Patient ID: Gina [name], female    DOB: 09/23/1926, 89 y.o.   MRN: [id number]  HPI  Patient is a 89 y.o. widowed [id number] here for pessary maintenance. She had her pessary cleaned about 4 months ago. She complains of an unpleasant vaginal odor recently.  The following portions of the patient's history were reviewed and updated as appropriate: allergies, current medications, past family history, past medical history, past social history, past surgical history, and problem list.  Review of Systems Pertinent items are noted in HPI.   Objective:   Blood pressure 135/78, pulse 88, weight 149 lb 4.8 oz (67.7 kg). Body mass index is 25.63 kg/m. Well nourished, well hydrated White female, no apparent distress She is  conversing normally. She uses a walker. Pederson speculum used and vaginal ulceration just to the left of the cervix is noted. Grade 1 cystocele noted  Assessment:   1. Pessary maintenance   2. Cystocele with prolapse   3.      Vaginal ulceration   Plan:   1. Pessary maintenance (Primary)  2. Cystocele with prolapse  3. She will leave the pessary out for a week and I will re examine her vagina and place pessary if the ulceration is healed.

## 2024-01-17 ENCOUNTER — Encounter: Payer: Self-pay | Admitting: Obstetrics & Gynecology

## 2024-01-17 ENCOUNTER — Ambulatory Visit: Admitting: Obstetrics & Gynecology

## 2024-01-17 VITALS — BP 128/80 | HR 79 | Wt 149.0 lb

## 2024-01-17 DIAGNOSIS — N8110 Cystocele, unspecified: Secondary | ICD-10-CM

## 2024-01-17 DIAGNOSIS — N814 Uterovaginal prolapse, unspecified: Secondary | ICD-10-CM

## 2024-01-17 NOTE — Progress Notes (Signed)
    GYNECOLOGY PROGRESS NOTE  Subjective:    Patient ID: Gina [name], female    DOB: 1927-02-09, 89 y.o.   MRN: [id number]  HPI  Patient is a 89 y.o. widowed [id number] here for re check of a vaginal ulceration/place her pessary after it has been out for the last week.  The following portions of the patient's history were reviewed and updated as appropriate: allergies, current medications, past family history, past medical history, past social history, past surgical history, and problem list.  Review of Systems Pertinent items are noted in HPI.   Objective:   Blood pressure 128/80, pulse 79, weight 149 lb (67.6 kg). Body mass index is 25.58 kg/m. Well nourished, well hydrated White female, no apparent distress She is  conversing normally. She uses a walker. I inspected her vagina with a speculum and the vaginal ulceration has healed. I replaced her pessary.  She denies any discomfort.  Assessment:   Cystocele   Plan:   She will come back in 3 months for pessary maintenance/prn sooner

## 2024-01-19 ENCOUNTER — Ambulatory Visit: Admitting: Obstetrics & Gynecology

## 2024-02-15 ENCOUNTER — Ambulatory Visit: Payer: Self-pay

## 2024-02-15 NOTE — Telephone Encounter (Signed)
 FYI Only or Action Required?: FYI only for provider: facility and nurse agreed UC best option if son will take her.  Patient was last seen in primary care on 01/10/2024 by Bernardo Fend, DO.  Called Nurse Triage reporting Nasal Congestion.  Symptoms began today.  Interventions attempted: Rest, hydration, or home remedies.  Symptoms are: gradually worsening.  Triage Disposition: See PCP When Office is Open (Within 3 Days)  Patient/caregiver understands and will follow disposition?: No, refuses disposition   Copied from CRM #[id number]. Topic: Clinical - Red Word Triage >> Feb 15, 2024  4:51 PM Nathanel BROCKS wrote: Red Word that prompted transfer to Nurse Triage: not feeling well, vitals good, congestion, cough.   Nurse at facility [facility] Reason for Disposition  Lots of coughing  Answer Assessment - Initial Assessment Questions Felicia staff for [facility] call and stated that the pt typically has lots of energy. Today she stated she didn't feel well and was in bed until after lunch. They state pt has been having nasal congestion with a cough. Not coughing anything up and listened and lungs are clear but pat is worried about it getting into her lungs. They deny any fevers and stated her vitals are within normal limits. Deny any shortness of breath or chest pain but state she has chest heaviness which is why pt thought it was in her lungs but then said that itmay be due to coughing. RN reviewed schedule nothing at [facility] or [facility] for tomorrow. Given pt's age and what seems like a rapid decline, Facility nurse asked if UC would be appropriate. RN advised it would be so that pt can be seen sooner than Friday. She agreed. She is going to call pts son to see if he would take her.   1. LOCATION: Where does it hurt?      Unknown if pain- congestion 2. ONSET: When did the sinus pain start?  (e.g., hours, days)      Congestion worsened today 3 NASAL CONGESTION: Is the nose  blocked? If Yes, ask: Can you open it or must you breathe through your mouth?     yes 4. NASAL DISCHARGE: Do you have discharge from your nose? If so ask, What color?     denies 5. FEVER: Do you have a fever? If Yes, ask: What is it, how was it measured, and when did it start?      denies 6. OTHER SYMPTOMS: Do you have any other symptoms? (e.g., sore throat, cough, earache, difficulty breathing)     Cough, fatigue, no energy. Denies any higher acuity symptoms.  Protocols used: Sinus Pain or Congestion-A-AH

## 2024-02-25 ENCOUNTER — Other Ambulatory Visit: Payer: Self-pay

## 2024-02-25 ENCOUNTER — Emergency Department
Admission: EM | Admit: 2024-02-25 | Discharge: 2024-02-25 | Disposition: A | Discharge status: Skilled Nursing Facility | Attending: Emergency Medicine | Admitting: Emergency Medicine

## 2024-02-25 ENCOUNTER — Emergency Department

## 2024-02-25 DIAGNOSIS — J069 Acute upper respiratory infection, unspecified: Secondary | ICD-10-CM | POA: Diagnosis not present

## 2024-02-25 DIAGNOSIS — I10 Essential (primary) hypertension: Secondary | ICD-10-CM | POA: Insufficient documentation

## 2024-02-25 DIAGNOSIS — H6121 Impacted cerumen, right ear: Secondary | ICD-10-CM | POA: Diagnosis not present

## 2024-02-25 DIAGNOSIS — R059 Cough, unspecified: Secondary | ICD-10-CM | POA: Diagnosis present

## 2024-02-25 DIAGNOSIS — R051 Acute cough: Secondary | ICD-10-CM

## 2024-02-25 LAB — CBC WITH DIFFERENTIAL/PLATELET
Abs Immature Granulocytes: 0.06 K/uL (ref 0.00–0.07)
Basophils Absolute: 0 K/uL (ref 0.0–0.1)
Basophils Relative: 0 %
Eosinophils Absolute: 0.1 K/uL (ref 0.0–0.5)
Eosinophils Relative: 1 %
HCT: 42.9 % (ref 36.0–46.0)
Hemoglobin: 14.3 g/dL (ref 12.0–15.0)
Immature Granulocytes: 1 %
Lymphocytes Relative: 12 %
Lymphs Abs: 1.1 K/uL (ref 0.7–4.0)
MCH: 31.2 pg (ref 26.0–34.0)
MCHC: 33.3 g/dL (ref 30.0–36.0)
MCV: 93.5 fL (ref 80.0–100.0)
Monocytes Absolute: 0.8 K/uL (ref 0.1–1.0)
Monocytes Relative: 9 %
Neutro Abs: 6.6 K/uL (ref 1.7–7.7)
Neutrophils Relative %: 77 %
Platelets: 490 K/uL — ABNORMAL HIGH (ref 150–400)
RBC: 4.59 MIL/uL (ref 3.87–5.11)
RDW: 14 % (ref 11.5–15.5)
WBC: 8.6 K/uL (ref 4.0–10.5)
nRBC: 0 % (ref 0.0–0.2)

## 2024-02-25 LAB — BASIC METABOLIC PANEL WITH GFR
Anion gap: 12 (ref 5–15)
BUN: 20 mg/dL (ref 8–23)
CO2: 21 mmol/L — ABNORMAL LOW (ref 22–32)
Calcium: 9.6 mg/dL (ref 8.9–10.3)
Chloride: 100 mmol/L (ref 98–111)
Creatinine, Ser: 1 mg/dL (ref 0.44–1.00)
GFR, Estimated: 52 mL/min — ABNORMAL LOW
Glucose, Bld: 161 mg/dL — ABNORMAL HIGH (ref 70–99)
Potassium: 4.5 mmol/L (ref 3.5–5.1)
Sodium: 133 mmol/L — ABNORMAL LOW (ref 135–145)

## 2024-02-25 LAB — RESP PANEL BY RT-PCR (RSV, FLU A&B, COVID)  RVPGX2
Influenza A by PCR: NEGATIVE
Influenza B by PCR: NEGATIVE
Resp Syncytial Virus by PCR: NEGATIVE
SARS Coronavirus 2 by RT PCR: NEGATIVE

## 2024-02-25 LAB — TROPONIN T, HIGH SENSITIVITY: Troponin T High Sensitivity: 18 ng/L (ref 0–19)

## 2024-02-25 MED ORDER — FLUTICASONE PROPIONATE 50 MCG/ACT NA SUSP: 1.0000 | Freq: Every day | NASAL | 0 refills | Status: AC

## 2024-02-25 MED ORDER — DOXYCYCLINE MONOHYDRATE 100 MG PO TABS: 100.0000 mg | ORAL_TABLET | Freq: Two times a day (BID) | ORAL | 0 refills | Status: AC

## 2024-02-25 NOTE — ED Notes (Signed)
 Attempted to call [facility] x3 with no answer. DC instructions given to pt whom is CAOx4 as well as pt's sons. Pt transported back to facility by her 2 sons.

## 2024-02-25 NOTE — ED Triage Notes (Addendum)
 Pt arrived from [facility] via [facility] d/t SOB/cough. Pt states that she hasn't felt well for a couple of weeks but cough/SOB started yesterday.   Pt AO x4 upon arrival. Pt not in acute distress upon arrival.

## 2024-02-25 NOTE — ED Provider Notes (Signed)
 "  Gina [name] Provider Note    Event Date/Time   First MD Initiated Contact with Patient 02/25/24 1223     (approximate)   History   Weakness, Shortness of Breath, and Cough   HPI  Gina [name] is a 89 y.o. female past medical history significant for hypertension, hyperlipidemia who presents to the emergency department for not feeling well.  Initially states that she has not been feeling well over the past 2 weeks.  Then states that she really started to feel poorly over the past 2 days.  Complaining of cough, congestion and feeling short of breath and that she cannot get a deep breath.  Generalized weakness.  Denies fever or chills.  Denies nausea or vomiting.  Denies any abdominal pain or diarrhea.  Denies dysuria, urinary urgency or frequency.     Physical Exam   Triage Vital Signs: ED Triage Vitals  Encounter Vitals Group     BP      Girls Systolic BP Percentile      Girls Diastolic BP Percentile      Boys Systolic BP Percentile      Boys Diastolic BP Percentile      Pulse      Resp      Temp      Temp src      SpO2      Weight      Height      Head Circumference      Peak Flow      Pain Score      Pain Loc      Pain Education      Exclude from Growth Chart     Most recent vital signs: Vitals:   02/25/24 1226 02/25/24 1235  BP: 139/78   Pulse: 80   Resp: 19   Temp: 97.7 F (36.5 C)   SpO2: 100% 100%    Physical Exam Constitutional:      Appearance: She is well-developed. She is not ill-appearing.  HENT:     Head: Atraumatic.  Eyes:     Extraocular Movements: Extraocular movements intact.     Conjunctiva/sclera: Conjunctivae normal.     Pupils: Pupils are equal, round, and reactive to light.  Cardiovascular:     Rate and Rhythm: Regular rhythm.  Pulmonary:     Effort: Pulmonary effort is normal. No respiratory distress.     Breath sounds: No wheezing.  Abdominal:     General: There is no distension.  Musculoskeletal:         General: Normal range of motion.     Cervical back: Normal range of motion.     Right lower leg: No edema.     Left lower leg: No edema.  Skin:    General: Skin is warm.     Capillary Refill: Capillary refill takes less than 2 seconds.  Neurological:     Mental Status: She is alert. Mental status is at baseline.  Psychiatric:        Mood and Affect: Mood normal.     IMPRESSION / MDM / ASSESSMENT AND PLAN / ED COURSE  I reviewed the triage vital signs and the nursing notes.  Differential diagnosis including pneumonia, viral illness including COVID/influenza, anemia, electrolyte abnormality, dehydration, ACS, pulmonary embolism     EKG  I, Clotilda Punter, the attending physician, personally viewed and interpreted this ECG.  Low voltage, no significant ST elevation or depression.  No findings of acute ischemia or dysrhythmia.  No significant change when compared to prior EKG in 2024  No tachycardic or bradycardic dysrhythmias while on cardiac telemetry.  RADIOLOGY I independently reviewed imaging, my interpretation of imaging: Chest x-ray no obvious findings of pneumonia.  Read as no acute findings.  LABS (all labs ordered are listed, but only abnormal results are displayed) Labs interpreted as -    Labs Reviewed  CBC WITH DIFFERENTIAL/PLATELET - Abnormal; Notable for the following components:      Result Value   Platelets 490 (*)    All other components within normal limits  BASIC METABOLIC PANEL WITH GFR - Abnormal; Notable for the following components:   Sodium 133 (*)    CO2 21 (*)    Glucose, Bld 161 (*)    GFR, Estimated 52 (*)    All other components within normal limits  RESP PANEL BY RT-PCR (RSV, FLU A&B, COVID)  RVPGX2  TROPONIN T, HIGH SENSITIVITY     MDM  No significant leukocytosis.  Mild hyponatremia that appears to be chronic at 133.  Creatinine at baseline with no significant electrolyte abnormality.  COVID influenza testing negative.  Low  suspicion for ACS, troponin negative at 18.  No findings consistent with heart failure.  Low suspicion for pulmonary embolism, no pleuritic chest pain, no significant shortness of breath or tachycardia.  No findings of DVT on exam.  Chest x-ray without fall: Findings consistent with pneumonia.  Given that the patient has cough, congestion and negative COVID and influenza testing will cover her for an atypical pneumonia with doxycycline .  Decreased hearing in the right ear with significant cerumen impaction.   Discussed close follow-up with the primary care physician.  Discussed return for any ongoing or worsening symptoms.  No questions or concerns at time of discharge.     PROCEDURES:  Critical Care performed: No  Ear Cerumen Removal  Date/Time: 02/25/2024 2:45 PM  Performed by: Suzanne Kirsch, MD Authorized by: Suzanne Kirsch, MD   Consent:    Consent obtained:  Verbal   Consent given by:  Patient   Risks, benefits, and alternatives were discussed: yes     Risks discussed:  Bleeding, dizziness, incomplete removal and TM perforation   Alternatives discussed:  Delayed treatment Universal protocol:    Procedure explained and questions answered to patient or proxy's satisfaction: yes   Procedure details:    Location:  R ear   Procedure type: curette     Procedure outcomes: cerumen removed   Post-procedure details:    Inspection:  Ear canal clear   Hearing quality:  Improved   Procedure completion:  Tolerated   Patient's presentation is most consistent with acute presentation with potential threat to life or bodily function.   MEDICATIONS ORDERED IN ED: Medications - No data to display  FINAL CLINICAL IMPRESSION(S) / ED DIAGNOSES   Final diagnoses:  Cerumen debris on tympanic membrane of right ear  Acute cough  Upper respiratory tract infection, unspecified type     Rx / DC Orders   ED Discharge Orders          Ordered    doxycycline  (ADOXA) 100 MG tablet  2 times  daily        02/25/24 1444    fluticasone  (FLONASE ) 50 MCG/ACT nasal spray  Daily        02/25/24 1444             Note:  This document was prepared using Dragon voice recognition software and may include unintentional dictation errors.  Suzanne Kirsch, MD 02/25/24 1448  "

## 2024-02-25 NOTE — Discharge Instructions (Signed)
 You were seen in the emergency department for cough and shortness of breath.  Your lab work was normal.  Your COVID and your influenza testing were negative.  Your chest x-ray did not show an obvious pneumonia.  Given your symptoms of cough and shortness of breath we will do a course of antibiotics to treat atypical pneumonia.  Follow-up closely with your primary care physician.  You were given Flonase  for your congestion  Doxycycline  - This medication can cause acid reflux.  It is important that you take it with food and drink plenty of water.  Do not lie down for 1 hour after taking this medication.  It also causes sun sensitivity so stay out of the sun or wear SPF while on this medication.

## 2024-03-01 ENCOUNTER — Ambulatory Visit: Payer: Self-pay

## 2024-03-01 ENCOUNTER — Telehealth: Payer: Self-pay

## 2024-03-01 NOTE — Telephone Encounter (Signed)
 Copied from CRM [id number]. Topic: Clinical - Medical Advice >> Mar 01, 2024  9:17 AM Adelita E wrote: Reason for CRM: Felicia with [facility] called in stating that the patient was started on doxycycline  (ADOXA) 100 MG tablet and it is causing an upset stomach, the feeling of wanting to vomit. Patient would like to know if she should stop taking this medication or continue with it until completed. Callback number for Bobbette is [phone]. Patient will not take this medication until an answer is received.

## 2024-03-01 NOTE — Telephone Encounter (Signed)
 ER gave for sinusitis , pt has discontinued and if symptoms start back she will make appt to be seen

## 2024-03-01 NOTE — Telephone Encounter (Signed)
 FYI Only or Action Required?: Action required by provider: clinical question for provider.  Patient was last seen in primary care on 01/10/2024 by Bernardo Fend, DO.  Called Nurse Triage reporting Medication Reaction.  Symptoms began since started the doxycyline 1/2.  Interventions attempted: Other: tried eating with meals .  Symptoms are: stable.  Triage Disposition: Discuss With PCP and Callback by Nurse Today (overriding Call PCP When Office is Open)  Patient/caregiver understands and will follow disposition?: Yes              Reason for Disposition  [1] Caller has NON-URGENT medicine question about med that PCP prescribed AND [2] triager unable to answer question    ER prescribed this  Answer Assessment - Initial Assessment Questions Felicia RN from [place], reports patient has acid reflux and takes famotidine , but hasn't been , trying to force eat knows has to take antibiotics with food , , has 2 days left. Has not vomited today and kept it down. Last time reported getting sick was 3 am not sure if actually vomited or not. She is feeling fine otherwise. Doxycycline  at ED 1/2 for pneumonia  (per chart had ER visit 1/2 and chest xray positive foe pneumonia), she has been feeling better hasn't had updated xray or anything. Wanted to know what to do about this and if provider wanted a follow up for the ER visit and if should continue the last 2 days of medication . This RN did review telephone message when [facility]ette RN from today earlier.     1. NAME of MEDICINE: What medicine(s) are you calling about?     Doxycycline   2. QUESTION: What is your question? (e.g., double dose of medicine, side effect)     Felica RN at [facility] reporting the doxycycline  is causing stomach upset and vomiting  3. PRESCRIBER: Who prescribed the medicine? Reason: if prescribed by specialist, call should be referred to that group.     ER 1/2 visit  4. SYMPTOMS: Do you  have any symptoms? If Yes, ask: What symptoms are you having?  How bad are the symptoms (e.g., mild, moderate, severe)     Upset stomach trying take with food but nausea with medication has kept dose down today  Protocols used: Medication Question Call-A-AH Copied from CRM #[id number]. Topic: Clinical - Medical Advice >> Mar 01, 2024  9:17 AM Adelita E wrote: Reason for CRM: Felicia with [facility] called in stating that the patient was started on doxycycline  (ADOXA) 100 MG tablet and it is causing an upset stomach, the feeling of wanting to vomit. Patient would like to know if she should stop taking this medication or continue with it until completed. Callback number for Bobbette is [phone]. Patient will not take this medication until an answer is received.

## 2024-03-10 ENCOUNTER — Encounter: Payer: Self-pay | Admitting: Internal Medicine

## 2024-03-10 ENCOUNTER — Ambulatory Visit: Admitting: Internal Medicine

## 2024-03-10 ENCOUNTER — Other Ambulatory Visit: Payer: Self-pay

## 2024-03-10 VITALS — BP 130/70 | HR 96 | Temp 97.7°F | Resp 16 | Ht 65.0 in | Wt 145.0 lb

## 2024-03-10 DIAGNOSIS — R300 Dysuria: Secondary | ICD-10-CM

## 2024-03-10 DIAGNOSIS — N3090 Cystitis, unspecified without hematuria: Secondary | ICD-10-CM

## 2024-03-10 DIAGNOSIS — K219 Gastro-esophageal reflux disease without esophagitis: Secondary | ICD-10-CM

## 2024-03-10 LAB — POCT URINALYSIS DIPSTICK
Bilirubin, UA: NEGATIVE
Glucose, UA: NEGATIVE
Ketones, UA: NEGATIVE
Protein, UA: POSITIVE — AB
Spec Grav, UA: 1.02
Urobilinogen, UA: 0.2 U/dL
pH, UA: 5

## 2024-03-10 MED ORDER — FAMOTIDINE 20 MG PO TABS: 20.0000 mg | ORAL_TABLET | Freq: Two times a day (BID) | ORAL | 1 refills | Status: AC | PRN

## 2024-03-10 MED ORDER — SULFAMETHOXAZOLE-TRIMETHOPRIM 800-160 MG PO TABS: 1.0000 | ORAL_TABLET | Freq: Two times a day (BID) | ORAL | 0 refills | Status: AC

## 2024-03-10 NOTE — Progress Notes (Signed)
 "  Established Patient Office Visit  Subjective:  Patient ID: Gina [name], female    DOB: 1926-04-28  Age: 89 y.o. MRN: [id number]  CC:  Chief Complaint  Patient presents with   Follow-up    ER     HPI Gina [name] presents for follow up on chronic medical conditions.   Discussed the use of AI scribe software for clinical note transcription with the patient, who gave verbal consent to proceed.  History of Present Illness  Gina [name] is a 89 year old female who presents with symptoms of a urinary tract infection.  She was recently treated in the ER for pneumonia with doxycycline  but stopped after four doses due to significant gastrointestinal upset. She feels better now off the antibiotic.  She has new urinary symptoms with pressure and discomfort from the navel down, urinary frequency, straining to urinate, bloating, and gas, consistent with a possible UTI.  She has kidney problems with a stable GFR of 52 and is worried about antibiotic effects on her kidneys. She is unsure if she has taken sulfa  antibiotics before.  She has acid reflux controlled with famotidine  (Pepcid ) twice daily after stopping Protonix  due to concern about long-term effects.  She has chronically low sodium at 131 to 133 without symptoms. She takes thyroid  medication and is due for routine blood testing in April.  Discharge Date: 02/25/24 Diagnosis: weakness, shortness of breath, cough Procedures/tests: EKG normal sinus rhythm, no change. Chest x-ray negative. No acute changes on labs. Flu and COVID negative.  New medications: Doxycycline  but stopped taking Status: better  Past Medical History:  Diagnosis Date   Breast mass in female    right breast   Cataract    Difficult intubation 03/03/2016   September 06, 2013; left nasal fiberoptic intubation #7 ETT; see letter from Dr. 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 Rudell   Hemorrhoids 07/29/2016   Hyperlipemia    Hyperlipidemia    history of    Hypothyroidism    Mild atherosclerosis of carotid artery 06/25/2015   Morton's neuroma    MRI contraindicated due to metal implant    Prolapse of uterus    Radiculopathy    Renal cyst    Rosacea    Shingles    Skin cancer    R paranasal, Mohs   Thrombocytosis     Past Surgical History:  Procedure Laterality Date   ADENOIDECTOMY     APPENDECTOMY     biopsy, right breast      CATARACT EXTRACTION W/PHACO Left 03/25/2015   Procedure: CATARACT EXTRACTION PHACO AND INTRAOCULAR LENS PLACEMENT (IOC);  Surgeon: Steven Dingeldein, MD;  Location: [facility] ORS;  Service: Ophthalmology;  Laterality: Left;  US  02:09 AP% 27.3 CDE 59.97 fluid pack lot #[id number]   CESAREAN SECTION     COLON SURGERY     JOINT REPLACEMENT Right    total hip   THYROID  LOBECTOMY     THYROIDECTOMY     partial   TONSILLECTOMY     TOTAL HIP ARTHROPLASTY     UPPER GI ENDOSCOPY  10/17/2008    Family History  Problem Relation Age of Onset   Stroke Mother    Stroke Father    Dementia Maternal Grandfather     Social History   Socioeconomic History  Marital status: Widowed    Spouse name: Not on file   Number of children: 2   Years of education: Not on file   Highest education level: Not on file  Occupational History   Occupation: retired   Tobacco Use   Smoking status: Former   Smokeless tobacco: Never  Vaping Use   Vaping status: Never Used  Substance and Sexual Activity   Alcohol use: No    Alcohol/week: 0.0 standard drinks of alcohol   Drug use: No   Sexual activity: Not Currently    Birth control/protection: Post-menopausal  Other Topics Concern   Not on file  Social History Narrative   Lost her husband May 5 th, 2021   She does not drive   She has two sons that live in town    Social Drivers of Health   Tobacco Use: Medium Risk (03/10/2024)   Patient History    Smoking Tobacco Use: Former    Smokeless Tobacco Use: Never    Passive Exposure: Not on Actuary Strain: Not on file  Food Insecurity: No Food Insecurity (09/09/2022)   Hunger Vital Sign    Worried About Running Out of Food in the Last Year: Never true    Ran Out of Food in the Last Year: Never true  Transportation Needs: No Transportation Needs (09/09/2022)   PRAPARE - Administrator, Civil Service (Medical): No    Lack of Transportation (Non-Medical): No  Physical Activity: Not on file  Stress: Not on file  Social Connections: Not on file  Intimate Partner Violence: Not At Risk (09/09/2022)   Humiliation, Afraid, Rape, and Kick questionnaire    Fear of Current or Ex-Partner: No    Emotionally Abused: No    Physically Abused: No    Sexually Abused: No  Depression (PHQ2-9): Low Risk (07/05/2023)   Depression (PHQ2-9)    PHQ-2 Score: 0  Alcohol Screen: Not on file  Housing: Low Risk (09/09/2022)   Housing    Last Housing Risk Score: 0  Utilities: Not At Risk (09/09/2022)   AHC Utilities    Threatened with loss of utilities: No  Health Literacy: Not on file    Outpatient Medications Prior to Visit  Medication Sig Dispense Refill   Acetaminophen  500 MG coapsule Take by mouth every 4 (four) hours as needed for fever.     amLODipine  (NORVASC ) 5 MG tablet Take 1 tablet (5 mg total) by mouth daily. 90 tablet 1   aspirin  EC 81 MG tablet Take 81 mg by mouth daily.      atorvastatin  (LIPITOR) 20 MG tablet Take 1 tablet (20 mg total) by mouth at bedtime. 90 tablet 3   Cholecalciferol (VITAMIN D3) 1000 units CAPS Take by mouth daily.      cyanocobalamin  (VITAMIN B12) 1000 MCG tablet Take 1,000 mcg by mouth daily.     famotidine  (PEPCID ) 20 MG tablet Take 1 tablet (20 mg total) by mouth 2 (two) times daily as needed for heartburn or indigestion (for refractory nausea,  indigestion or reflux). 60 tablet 1   fluticasone  (FLONASE ) 50 MCG/ACT nasal spray Place 1 spray into both nostrils daily. 9.9 mL 0   hydroxyurea  (HYDREA ) 500 MG capsule TAKE 1 CAPSULE BY MOUTH 4 DAYS A WEEK 60 capsule 0   irbesartan  (AVAPRO ) 150 MG tablet Take 1 tablet (150 mg total) by mouth daily. 90 tablet 1   ketoconazole  (NIZORAL ) 2 % shampoo Apply 1 Application topically 3 (three) times a week.  Wash scalp 3 times weekly, let sit 5 minutes and rinse out 120 mL 11   levothyroxine  (SYNTHROID ) 88 MCG tablet TAKE 1 TABLET BY MOUTH ONCE DAILY BEFORE BREAKFAST 90 tablet 3   loratadine  (CLARITIN ) 10 MG tablet Take 1 tablet (10 mg total) by mouth at bedtime as needed for rhinitis (sinus/nasal symptoms). 30 tablet 1   nystatin  cream (MYCOSTATIN ) APPLY 1 APPLICATION TOPICALLY TWICE DAILY 30 g 0   OXYQUINOLONE SULFATE VAGINAL (TRIMO-SAN) 0.025 % GEL INSERT 1/3 APPLICATORFUL VAGINALLY TWICE WEEKLY. 113 g 2   No facility-administered medications prior to visit.    Allergies  Allergen Reactions   Novocain [Procaine] Palpitations    ROS Review of Systems  Constitutional:  Negative for fatigue and fever.  Gastrointestinal:  Negative for abdominal pain.  Genitourinary:  Positive for dysuria. Negative for frequency, hematuria and urgency.      Objective:    Physical Exam Constitutional:      Appearance: Normal appearance.     Comments: Uses walker   HENT:     Head: Normocephalic and atraumatic.  Eyes:     Conjunctiva/sclera: Conjunctivae normal.  Cardiovascular:     Rate and Rhythm: Normal rate and regular rhythm.  Pulmonary:     Effort: Pulmonary effort is normal.     Breath sounds: Normal breath sounds.  Abdominal:     Tenderness: There is no right CVA tenderness or left CVA tenderness.  Skin:    General: Skin is warm and dry.  Neurological:     General: No focal deficit present.     Mental Status: She is alert. Mental status is at baseline.  Psychiatric:        Mood and  Affect: Mood normal.        Behavior: Behavior normal.     BP 130/70 (Cuff Size: Normal)   Pulse 96   Temp 97.7 F (36.5 C) (Oral)   Resp 16   Ht 5' 5 (1.651 m)   Wt 145 lb (65.8 kg)   SpO2 97%   BMI 24.13 kg/m  Wt Readings from Last 3 Encounters:  03/10/24 145 lb (65.8 kg)  02/25/24 148 lb (67.1 kg)  01/17/24 149 lb (67.6 kg)     Health Maintenance Due  Topic Date Due   Zoster Vaccines- Shingrix (1 of 2) Never done   Medicare Annual Wellness (AWV)  01/05/2018   FOOT EXAM  12/16/2023    There are no preventive care reminders to display for this patient.  Lab Results  Component Value Date   TSH 2.21 06/16/2023   Lab Results  Component Value Date   WBC 8.6 02/25/2024   HGB 14.3 02/25/2024   HCT 42.9 02/25/2024   MCV 93.5 02/25/2024   PLT 490 (H) 02/25/2024   Lab Results  Component Value Date   NA 133 (L) 02/25/2024   K 4.5 02/25/2024   CO2 21 (L) 02/25/2024   GLUCOSE 161 (H) 02/25/2024   BUN 20 02/25/2024   CREATININE 1.00 02/25/2024   BILITOT 1.7 (H) 10/29/2023   ALKPHOS 76 10/29/2023   AST 21 10/29/2023   ALT 15 10/29/2023   PROT 7.8 10/29/2023   ALBUMIN 4.5 10/29/2023   CALCIUM  9.6 02/25/2024   ANIONGAP 12 02/25/2024   EGFR 48 (L) 06/16/2023   Lab Results  Component Value Date   CHOL 174 06/16/2023   Lab Results  Component Value Date   HDL 52 06/16/2023   Lab Results  Component Value Date   LDLCALC 91 06/16/2023  Lab Results  Component Value Date   TRIG 215 (H) 06/16/2023   Lab Results  Component Value Date   CHOLHDL 3.3 06/16/2023   Lab Results  Component Value Date   HGBA1C 5.9 (A) 01/10/2024      Assessment & Plan:   Assessment & Plan  Urinary tract infection Confirmed by urinalysis with leukocytes, nitrates, and hematuria. Sodium levels slightly low but stable. GFR 52 indicates low kidney function but not severely impaired. - Prescribed Bactrim  1 tablet twice daily for 3 days. - Sent urine sample for culture to  confirm bacterial type and antibiotic sensitivity. - Advised to take Bactrim  with food to minimize gastrointestinal upset. - Instructed to contact if experiencing adverse reactions to the antibiotic. - Plan to call with culture results next week.  Gastroesophageal reflux disease Protonix  discontinued due to potential nutrient absorption issues. - Prescribed new famotidine  tablets. - Advised to take famotidine  as previously effective.  - POCT urinalysis dipstick - Urine Culture - famotidine  (PEPCID ) 20 MG tablet; Take 1 tablet (20 mg total) by mouth 2 (two) times daily as needed for heartburn or indigestion (for refractory nausea, indigestion or reflux).  Dispense: 60 tablet; Refill: 1 - sulfamethoxazole -trimethoprim  (BACTRIM  DS) 800-160 MG tablet; Take 1 tablet by mouth 2 (two) times daily for 3 days.  Dispense: 6 tablet; Refill: 0 - Urine Culture   Follow-up: Return for already scheduled.    Sharyle Fischer, DO "

## 2024-04-11 ENCOUNTER — Ambulatory Visit: Admitting: Obstetrics & Gynecology

## 2024-04-26 ENCOUNTER — Other Ambulatory Visit

## 2024-04-26 ENCOUNTER — Ambulatory Visit: Admitting: Oncology

## 2024-04-27 ENCOUNTER — Ambulatory Visit: Admitting: Oncology

## 2024-04-27 ENCOUNTER — Other Ambulatory Visit

## 2024-07-10 ENCOUNTER — Ambulatory Visit: Admitting: Internal Medicine

## 2024-08-21 ENCOUNTER — Encounter: Admitting: Dermatology
